# Patient Record
Sex: Female | Born: 1980
Health system: Southern US, Community
[De-identification: ages and names within clinical notes are randomized; demographics above are authoritative.]

## PROBLEM LIST (undated history)

## (undated) DIAGNOSIS — K589 Irritable bowel syndrome without diarrhea: Secondary | ICD-10-CM

## (undated) DIAGNOSIS — K219 Gastro-esophageal reflux disease without esophagitis: Secondary | ICD-10-CM

## (undated) DIAGNOSIS — R0602 Shortness of breath: Secondary | ICD-10-CM

## (undated) DIAGNOSIS — D509 Iron deficiency anemia, unspecified: Secondary | ICD-10-CM

## (undated) DIAGNOSIS — F410 Panic disorder [episodic paroxysmal anxiety] without agoraphobia: Secondary | ICD-10-CM

## (undated) DIAGNOSIS — IMO0002 Reserved for concepts with insufficient information to code with codable children: Secondary | ICD-10-CM

## (undated) DIAGNOSIS — R7303 Prediabetes: Secondary | ICD-10-CM

## (undated) DIAGNOSIS — E7800 Pure hypercholesterolemia, unspecified: Secondary | ICD-10-CM

## (undated) DIAGNOSIS — R002 Palpitations: Secondary | ICD-10-CM

## (undated) DIAGNOSIS — F32A Depression, unspecified: Secondary | ICD-10-CM

## (undated) DIAGNOSIS — A048 Other specified bacterial intestinal infections: Secondary | ICD-10-CM

## (undated) DIAGNOSIS — K5909 Other constipation: Secondary | ICD-10-CM

## (undated) DIAGNOSIS — E669 Obesity, unspecified: Secondary | ICD-10-CM

## (undated) DIAGNOSIS — M25569 Pain in unspecified knee: Secondary | ICD-10-CM

## (undated) DIAGNOSIS — K5900 Constipation, unspecified: Secondary | ICD-10-CM

## (undated) DIAGNOSIS — F329 Major depressive disorder, single episode, unspecified: Secondary | ICD-10-CM

## (undated) DIAGNOSIS — K760 Fatty (change of) liver, not elsewhere classified: Secondary | ICD-10-CM

## (undated) DIAGNOSIS — E559 Vitamin D deficiency, unspecified: Secondary | ICD-10-CM

## (undated) DIAGNOSIS — E538 Deficiency of other specified B group vitamins: Secondary | ICD-10-CM

## (undated) DIAGNOSIS — D649 Anemia, unspecified: Secondary | ICD-10-CM

## (undated) DIAGNOSIS — F419 Anxiety disorder, unspecified: Secondary | ICD-10-CM

## (undated) HISTORY — DX: Gastro-esophageal reflux disease without esophagitis: K21.9

## (undated) HISTORY — DX: Vitamin D deficiency, unspecified: E55.9

## (undated) HISTORY — DX: Other constipation: K59.09

## (undated) HISTORY — PX: UPPER GASTROINTESTINAL ENDOSCOPY: SHX188

## (undated) HISTORY — DX: Major depressive disorder, single episode, unspecified: F32.9

## (undated) HISTORY — DX: Irritable bowel syndrome, unspecified: K58.9

## (undated) HISTORY — DX: Anxiety disorder, unspecified: F41.9

## (undated) HISTORY — DX: Other specified bacterial intestinal infections: A04.8

## (undated) HISTORY — DX: Iron deficiency anemia, unspecified: D50.9

## (undated) HISTORY — DX: Obesity, unspecified: E66.9

## (undated) HISTORY — DX: Pure hypercholesterolemia, unspecified: E78.00

## (undated) HISTORY — DX: Fatty (change of) liver, not elsewhere classified: K76.0

## (undated) HISTORY — DX: Depression, unspecified: F32.A

## (undated) HISTORY — DX: Panic disorder (episodic paroxysmal anxiety): F41.0

## (undated) HISTORY — DX: Reserved for concepts with insufficient information to code with codable children: IMO0002

## (undated) HISTORY — PX: ABDOMINAL HYSTERECTOMY: SHX81

## (undated) HISTORY — DX: Prediabetes: R73.03

## (undated) HISTORY — DX: Shortness of breath: R06.02

## (undated) HISTORY — PX: NASAL SINUS SURGERY: SHX719

## (undated) HISTORY — DX: Palpitations: R00.2

## (undated) HISTORY — DX: Deficiency of other specified B group vitamins: E53.8

## (undated) HISTORY — DX: Pain in unspecified knee: M25.569

## (undated) HISTORY — DX: Anemia, unspecified: D64.9

## (undated) HISTORY — DX: Constipation, unspecified: K59.00

---

## 2004-09-18 ENCOUNTER — Other Ambulatory Visit: Admission: RE | Admit: 2004-09-18 | Discharge: 2004-09-18 | Payer: Self-pay | Admitting: Obstetrics and Gynecology

## 2004-10-14 ENCOUNTER — Inpatient Hospital Stay (HOSPITAL_COMMUNITY): Admission: EM | Admit: 2004-10-14 | Discharge: 2004-10-16 | Payer: Self-pay | Admitting: Emergency Medicine

## 2004-10-31 ENCOUNTER — Ambulatory Visit: Payer: Self-pay | Admitting: Pulmonary Disease

## 2004-11-12 ENCOUNTER — Ambulatory Visit: Payer: Self-pay | Admitting: Internal Medicine

## 2005-01-15 ENCOUNTER — Ambulatory Visit: Payer: Self-pay | Admitting: Internal Medicine

## 2005-02-06 ENCOUNTER — Ambulatory Visit: Payer: Self-pay | Admitting: Pulmonary Disease

## 2005-05-09 ENCOUNTER — Ambulatory Visit: Payer: Self-pay | Admitting: Internal Medicine

## 2005-06-19 ENCOUNTER — Inpatient Hospital Stay (HOSPITAL_COMMUNITY): Admission: AD | Admit: 2005-06-19 | Discharge: 2005-06-19 | Payer: Self-pay | Admitting: Family Medicine

## 2005-07-21 ENCOUNTER — Inpatient Hospital Stay (HOSPITAL_COMMUNITY): Admission: AD | Admit: 2005-07-21 | Discharge: 2005-07-21 | Payer: Self-pay | Admitting: Obstetrics

## 2005-08-22 ENCOUNTER — Ambulatory Visit (HOSPITAL_COMMUNITY): Admission: RE | Admit: 2005-08-22 | Discharge: 2005-08-22 | Payer: Self-pay | Admitting: *Deleted

## 2005-08-26 ENCOUNTER — Ambulatory Visit: Payer: Self-pay | Admitting: Pulmonary Disease

## 2005-09-08 ENCOUNTER — Inpatient Hospital Stay (HOSPITAL_COMMUNITY): Admission: AD | Admit: 2005-09-08 | Discharge: 2005-09-09 | Payer: Self-pay | Admitting: Obstetrics & Gynecology

## 2005-09-08 ENCOUNTER — Ambulatory Visit: Payer: Self-pay | Admitting: Certified Nurse Midwife

## 2005-09-11 ENCOUNTER — Ambulatory Visit: Payer: Self-pay | Admitting: Internal Medicine

## 2005-10-13 ENCOUNTER — Ambulatory Visit: Payer: Self-pay | Admitting: Internal Medicine

## 2005-11-03 ENCOUNTER — Inpatient Hospital Stay (HOSPITAL_COMMUNITY): Admission: AD | Admit: 2005-11-03 | Discharge: 2005-11-04 | Payer: Self-pay | Admitting: Internal Medicine

## 2005-11-04 ENCOUNTER — Ambulatory Visit: Payer: Self-pay | Admitting: Internal Medicine

## 2005-12-04 ENCOUNTER — Ambulatory Visit: Payer: Self-pay | Admitting: Internal Medicine

## 2006-01-07 ENCOUNTER — Inpatient Hospital Stay (HOSPITAL_COMMUNITY): Admission: AD | Admit: 2006-01-07 | Discharge: 2006-01-07 | Payer: Self-pay | Admitting: *Deleted

## 2006-01-21 ENCOUNTER — Ambulatory Visit (HOSPITAL_COMMUNITY): Admission: RE | Admit: 2006-01-21 | Discharge: 2006-01-21 | Payer: Self-pay | Admitting: Obstetrics and Gynecology

## 2006-01-26 ENCOUNTER — Inpatient Hospital Stay (HOSPITAL_COMMUNITY): Admission: AD | Admit: 2006-01-26 | Discharge: 2006-01-26 | Payer: Self-pay | Admitting: Obstetrics and Gynecology

## 2006-01-31 ENCOUNTER — Inpatient Hospital Stay (HOSPITAL_COMMUNITY): Admission: AD | Admit: 2006-01-31 | Discharge: 2006-01-31 | Payer: Self-pay | Admitting: Obstetrics and Gynecology

## 2006-02-01 ENCOUNTER — Inpatient Hospital Stay (HOSPITAL_COMMUNITY): Admission: AD | Admit: 2006-02-01 | Discharge: 2006-02-03 | Payer: Self-pay | Admitting: Obstetrics and Gynecology

## 2006-02-06 ENCOUNTER — Emergency Department (HOSPITAL_COMMUNITY): Admission: EM | Admit: 2006-02-06 | Discharge: 2006-02-06 | Payer: Self-pay | Admitting: Emergency Medicine

## 2006-03-19 ENCOUNTER — Ambulatory Visit: Payer: Self-pay | Admitting: Internal Medicine

## 2006-03-20 ENCOUNTER — Ambulatory Visit: Payer: Self-pay | Admitting: Internal Medicine

## 2006-04-01 ENCOUNTER — Ambulatory Visit: Payer: Self-pay | Admitting: Internal Medicine

## 2006-04-09 ENCOUNTER — Ambulatory Visit (HOSPITAL_COMMUNITY): Admission: RE | Admit: 2006-04-09 | Discharge: 2006-04-09 | Payer: Self-pay | Admitting: Obstetrics and Gynecology

## 2006-04-13 ENCOUNTER — Ambulatory Visit: Payer: Self-pay | Admitting: Internal Medicine

## 2006-04-14 ENCOUNTER — Encounter: Payer: Self-pay | Admitting: Vascular Surgery

## 2006-04-14 ENCOUNTER — Ambulatory Visit (HOSPITAL_COMMUNITY): Admission: RE | Admit: 2006-04-14 | Discharge: 2006-04-14 | Payer: Self-pay | Admitting: Internal Medicine

## 2006-04-24 ENCOUNTER — Ambulatory Visit: Payer: Self-pay | Admitting: Internal Medicine

## 2006-05-07 ENCOUNTER — Ambulatory Visit: Payer: Self-pay | Admitting: Internal Medicine

## 2006-05-08 ENCOUNTER — Ambulatory Visit: Payer: Self-pay | Admitting: Internal Medicine

## 2006-06-25 ENCOUNTER — Encounter: Admission: RE | Admit: 2006-06-25 | Discharge: 2006-09-23 | Payer: Self-pay | Admitting: Pediatrics

## 2006-07-22 ENCOUNTER — Ambulatory Visit: Payer: Self-pay | Admitting: Internal Medicine

## 2006-07-27 LAB — CONVERTED CEMR LAB
ALT: 31 units/L (ref 0–40)
AST: 30 units/L (ref 0–37)
Albumin: 3.6 g/dL (ref 3.5–5.2)
Alkaline Phosphatase: 70 units/L (ref 39–117)
BUN: 10 mg/dL (ref 6–23)
Basophils Absolute: 0 10*3/uL (ref 0.0–0.1)
Basophils Relative: 0.4 % (ref 0.0–1.0)
CO2: 27 meq/L (ref 19–32)
Calcium: 9.2 mg/dL (ref 8.4–10.5)
Chloride: 107 meq/L (ref 96–112)
Creatinine, Ser: 0.9 mg/dL (ref 0.4–1.2)
Eosinophil percent: 14 % — ABNORMAL HIGH (ref 0.0–5.0)
GFR calc non Af Amer: 82 mL/min
Glomerular Filtration Rate, Af Am: 99 mL/min/{1.73_m2}
Glucose, Bld: 97 mg/dL (ref 70–99)
HCT: 38.8 % (ref 36.0–46.0)
Hemoglobin: 13 g/dL (ref 12.0–15.0)
Lymphocytes Relative: 28.4 % (ref 12.0–46.0)
MCHC: 33.5 g/dL (ref 30.0–36.0)
MCV: 84.1 fL (ref 78.0–100.0)
Monocytes Absolute: 0.6 10*3/uL (ref 0.2–0.7)
Monocytes Relative: 6.2 % (ref 3.0–11.0)
Neutro Abs: 4.6 10*3/uL (ref 1.4–7.7)
Neutrophils Relative %: 51 % (ref 43.0–77.0)
Platelets: 264 10*3/uL (ref 150–400)
Potassium: 4.7 meq/L (ref 3.5–5.1)
RBC: 4.62 M/uL (ref 3.87–5.11)
RDW: 12 % (ref 11.5–14.6)
Sodium: 140 meq/L (ref 135–145)
TSH: 1.38 microintl units/mL (ref 0.35–5.50)
Total Bilirubin: 0.8 mg/dL (ref 0.3–1.2)
Total Protein: 7 g/dL (ref 6.0–8.3)
WBC: 9.1 10*3/uL (ref 4.5–10.5)

## 2006-08-14 ENCOUNTER — Encounter: Payer: Self-pay | Admitting: Cardiology

## 2006-08-14 ENCOUNTER — Ambulatory Visit: Payer: Self-pay

## 2006-08-27 ENCOUNTER — Ambulatory Visit: Payer: Self-pay | Admitting: Pulmonary Disease

## 2006-08-28 ENCOUNTER — Ambulatory Visit: Payer: Self-pay | Admitting: Cardiovascular Disease

## 2006-08-31 ENCOUNTER — Ambulatory Visit: Payer: Self-pay | Admitting: Internal Medicine

## 2006-09-08 DIAGNOSIS — A048 Other specified bacterial intestinal infections: Secondary | ICD-10-CM

## 2006-09-08 HISTORY — DX: Other specified bacterial intestinal infections: A04.8

## 2006-09-25 ENCOUNTER — Ambulatory Visit: Payer: Self-pay | Admitting: Internal Medicine

## 2006-10-07 ENCOUNTER — Ambulatory Visit: Payer: Self-pay | Admitting: Internal Medicine

## 2006-10-07 ENCOUNTER — Encounter: Payer: Self-pay | Admitting: Internal Medicine

## 2006-10-07 LAB — CONVERTED CEMR LAB
ALT: 24 units/L (ref 0–40)
AST: 25 units/L (ref 0–37)
Albumin: 4 g/dL (ref 3.5–5.2)
Alkaline Phosphatase: 76 units/L (ref 39–117)
Amylase: 116 units/L (ref 27–131)
Bilirubin Urine: NEGATIVE
Bilirubin, Direct: 0.1 mg/dL (ref 0.0–0.3)
H Pylori IgG: POSITIVE — AB
Ketones, ur: NEGATIVE mg/dL
Leukocytes, UA: NEGATIVE
Lipase: 33 units/L (ref 11.0–59.0)
Nitrite: NEGATIVE
Specific Gravity, Urine: 1.02 (ref 1.000–1.03)
Total Bilirubin: 0.5 mg/dL (ref 0.3–1.2)
Total Protein, Urine: NEGATIVE mg/dL
Total Protein: 7.6 g/dL (ref 6.0–8.3)
Urine Glucose: NEGATIVE mg/dL
Urobilinogen, UA: 0.2 (ref 0.0–1.0)
pH: 6.5 (ref 5.0–8.0)

## 2006-10-08 ENCOUNTER — Ambulatory Visit: Payer: Self-pay | Admitting: Gastroenterology

## 2006-10-27 ENCOUNTER — Ambulatory Visit: Payer: Self-pay | Admitting: Internal Medicine

## 2006-10-27 LAB — CONVERTED CEMR LAB
ALT: 25 units/L (ref 0–40)
AST: 24 units/L (ref 0–37)
Albumin: 3.8 g/dL (ref 3.5–5.2)
Alkaline Phosphatase: 73 units/L (ref 39–117)
BUN: 10 mg/dL (ref 6–23)
Bacteria, UA: NEGATIVE
Basophils Absolute: 0.1 10*3/uL (ref 0.0–0.1)
Basophils Relative: 0.8 % (ref 0.0–1.0)
Bilirubin Urine: NEGATIVE
Bilirubin, Direct: 0.1 mg/dL (ref 0.0–0.3)
CO2: 28 meq/L (ref 19–32)
Calcium: 9.4 mg/dL (ref 8.4–10.5)
Chloride: 105 meq/L (ref 96–112)
Cholesterol: 133 mg/dL (ref 0–200)
Creatinine, Ser: 0.7 mg/dL (ref 0.4–1.2)
Crystals: NEGATIVE
Eosinophils Absolute: 1.2 10*3/uL — ABNORMAL HIGH (ref 0.0–0.6)
Eosinophils Relative: 14.7 % — ABNORMAL HIGH (ref 0.0–5.0)
GFR calc Af Amer: 132 mL/min
GFR calc non Af Amer: 109 mL/min
Glucose, Bld: 89 mg/dL (ref 70–99)
H Pylori IgG: POSITIVE — AB
HCT: 38 % (ref 36.0–46.0)
HDL: 36.4 mg/dL — ABNORMAL LOW (ref >39.0)
Hemoglobin: 12.9 g/dL (ref 12.0–15.0)
Ketones, ur: NEGATIVE mg/dL
LDL Cholesterol: 77 mg/dL (ref 0–99)
Leukocytes, UA: NEGATIVE
Lymphocytes Relative: 36.4 % (ref 12.0–46.0)
MCHC: 33.9 g/dL (ref 30.0–36.0)
MCV: 82.6 fL (ref 78.0–100.0)
Monocytes Absolute: 0.5 10*3/uL (ref 0.2–0.7)
Monocytes Relative: 5.7 % (ref 3.0–11.0)
Neutro Abs: 3.3 10*3/uL (ref 1.4–7.7)
Neutrophils Relative %: 42.4 % — ABNORMAL LOW (ref 43.0–77.0)
Nitrite: NEGATIVE
Platelets: 257 10*3/uL (ref 150–400)
Potassium: 4.2 meq/L (ref 3.5–5.1)
RBC: 4.6 M/uL (ref 3.87–5.11)
RDW: 13.4 % (ref 11.5–14.6)
Sodium: 139 meq/L (ref 135–145)
Specific Gravity, Urine: 1.02 (ref 1.000–1.03)
TSH: 0.79 microintl units/mL (ref 0.35–5.50)
Total Bilirubin: 0.5 mg/dL (ref 0.3–1.2)
Total CHOL/HDL Ratio: 3.7
Total Protein, Urine: NEGATIVE mg/dL
Total Protein: 7.2 g/dL (ref 6.0–8.3)
Triglycerides: 97 mg/dL (ref 0–149)
Urine Glucose: NEGATIVE mg/dL
Urobilinogen, UA: 0.2 (ref 0.0–1.0)
VLDL: 19 mg/dL (ref 0–40)
WBC: 8 10*3/uL (ref 4.5–10.5)
pH: 6.5 (ref 5.0–8.0)

## 2006-11-03 ENCOUNTER — Ambulatory Visit: Payer: Self-pay | Admitting: Internal Medicine

## 2006-11-04 ENCOUNTER — Ambulatory Visit: Payer: Self-pay | Admitting: Internal Medicine

## 2007-01-01 ENCOUNTER — Ambulatory Visit: Payer: Self-pay | Admitting: Internal Medicine

## 2007-02-16 ENCOUNTER — Inpatient Hospital Stay (HOSPITAL_COMMUNITY): Admission: AD | Admit: 2007-02-16 | Discharge: 2007-02-16 | Payer: Self-pay | Admitting: Obstetrics and Gynecology

## 2007-03-23 ENCOUNTER — Ambulatory Visit: Payer: Self-pay | Admitting: Internal Medicine

## 2007-03-26 ENCOUNTER — Ambulatory Visit: Payer: Self-pay | Admitting: Internal Medicine

## 2007-04-03 ENCOUNTER — Encounter: Payer: Self-pay | Admitting: Internal Medicine

## 2007-04-03 DIAGNOSIS — J45909 Unspecified asthma, uncomplicated: Secondary | ICD-10-CM | POA: Insufficient documentation

## 2007-04-03 DIAGNOSIS — J329 Chronic sinusitis, unspecified: Secondary | ICD-10-CM | POA: Insufficient documentation

## 2007-04-30 ENCOUNTER — Emergency Department (HOSPITAL_COMMUNITY): Admission: EM | Admit: 2007-04-30 | Discharge: 2007-05-01 | Payer: Self-pay | Admitting: Emergency Medicine

## 2007-05-26 ENCOUNTER — Encounter: Payer: Self-pay | Admitting: Obstetrics and Gynecology

## 2007-05-26 ENCOUNTER — Ambulatory Visit (HOSPITAL_COMMUNITY): Admission: RE | Admit: 2007-05-26 | Discharge: 2007-05-26 | Payer: Self-pay | Admitting: Obstetrics and Gynecology

## 2007-09-05 ENCOUNTER — Inpatient Hospital Stay (HOSPITAL_COMMUNITY): Admission: AD | Admit: 2007-09-05 | Discharge: 2007-09-05 | Payer: Self-pay | Admitting: Obstetrics and Gynecology

## 2007-09-24 ENCOUNTER — Inpatient Hospital Stay (HOSPITAL_COMMUNITY): Admission: AD | Admit: 2007-09-24 | Discharge: 2007-09-26 | Payer: Self-pay | Admitting: Obstetrics and Gynecology

## 2007-10-19 ENCOUNTER — Ambulatory Visit: Payer: Self-pay | Admitting: Internal Medicine

## 2007-10-20 ENCOUNTER — Ambulatory Visit: Payer: Self-pay | Admitting: Internal Medicine

## 2007-10-21 LAB — CONVERTED CEMR LAB
ALT: 22 units/L (ref 0–35)
AST: 28 units/L (ref 0–37)
Albumin: 3.9 g/dL (ref 3.5–5.2)
Alkaline Phosphatase: 83 units/L (ref 39–117)
BUN: 7 mg/dL (ref 6–23)
Basophils Absolute: 0.1 10*3/uL (ref 0.0–0.1)
Basophils Relative: 2.1 % — ABNORMAL HIGH (ref 0.0–1.0)
Bilirubin Urine: NEGATIVE
Bilirubin, Direct: 0.1 mg/dL (ref 0.0–0.3)
CO2: 29 meq/L (ref 19–32)
Calcium: 9.1 mg/dL (ref 8.4–10.5)
Chloride: 105 meq/L (ref 96–112)
Cholesterol: 155 mg/dL (ref 0–200)
Creatinine, Ser: 0.8 mg/dL (ref 0.4–1.2)
Crystals: NEGATIVE
Eosinophils Absolute: 0.3 10*3/uL (ref 0.0–0.6)
Eosinophils Relative: 4.5 % (ref 0.0–5.0)
GFR calc Af Amer: 112 mL/min
GFR calc non Af Amer: 93 mL/min
Glucose, Bld: 94 mg/dL (ref 70–99)
HCT: 42 % (ref 36.0–46.0)
HDL: 42.6 mg/dL (ref >39.0)
Hemoglobin: 13.6 g/dL (ref 12.0–15.0)
Ketones, ur: NEGATIVE mg/dL
LDL Cholesterol: 97 mg/dL (ref 0–99)
Lymphocytes Relative: 16.7 % (ref 12.0–46.0)
MCHC: 32.3 g/dL (ref 30.0–36.0)
MCV: 83 fL (ref 78.0–100.0)
Monocytes Absolute: 0.6 10*3/uL (ref 0.2–0.7)
Monocytes Relative: 10.2 % (ref 3.0–11.0)
Mucus, UA: NEGATIVE
Neutro Abs: 4.1 10*3/uL (ref 1.4–7.7)
Neutrophils Relative %: 66.5 % (ref 43.0–77.0)
Nitrite: NEGATIVE
Platelets: 206 10*3/uL (ref 150–400)
Potassium: 3.6 meq/L (ref 3.5–5.1)
RBC: 5.06 M/uL (ref 3.87–5.11)
RDW: 13.5 % (ref 11.5–14.6)
Sodium: 142 meq/L (ref 135–145)
Specific Gravity, Urine: <=1.005 (ref 1.000–1.03)
TSH: 0.24 microintl units/mL — ABNORMAL LOW (ref 0.35–5.50)
Total Bilirubin: 0.4 mg/dL (ref 0.3–1.2)
Total CHOL/HDL Ratio: 3.6
Total Protein, Urine: NEGATIVE mg/dL
Total Protein: 7.6 g/dL (ref 6.0–8.3)
Triglycerides: 77 mg/dL (ref 0–149)
Urine Glucose: NEGATIVE mg/dL
Urobilinogen, UA: 0.2 (ref 0.0–1.0)
VLDL: 15 mg/dL (ref 0–40)
WBC: 6.1 10*3/uL (ref 4.5–10.5)
pH: 5.5 (ref 5.0–8.0)

## 2007-10-22 LAB — CONVERTED CEMR LAB
Inflenza A Ag: POSITIVE — AB
Influenza B Ag: NEGATIVE

## 2007-11-25 ENCOUNTER — Ambulatory Visit: Payer: Self-pay | Admitting: Internal Medicine

## 2007-11-25 DIAGNOSIS — F411 Generalized anxiety disorder: Secondary | ICD-10-CM | POA: Insufficient documentation

## 2007-11-25 DIAGNOSIS — J309 Allergic rhinitis, unspecified: Secondary | ICD-10-CM | POA: Insufficient documentation

## 2007-11-25 DIAGNOSIS — K219 Gastro-esophageal reflux disease without esophagitis: Secondary | ICD-10-CM | POA: Insufficient documentation

## 2007-11-30 ENCOUNTER — Telehealth: Payer: Self-pay | Admitting: Family Medicine

## 2007-12-08 ENCOUNTER — Ambulatory Visit: Payer: Self-pay

## 2007-12-08 ENCOUNTER — Encounter: Payer: Self-pay | Admitting: Internal Medicine

## 2008-01-11 ENCOUNTER — Ambulatory Visit: Payer: Self-pay | Admitting: Internal Medicine

## 2008-01-11 DIAGNOSIS — R002 Palpitations: Secondary | ICD-10-CM | POA: Insufficient documentation

## 2008-01-11 DIAGNOSIS — R519 Headache, unspecified: Secondary | ICD-10-CM | POA: Insufficient documentation

## 2008-01-11 DIAGNOSIS — R51 Headache: Secondary | ICD-10-CM | POA: Insufficient documentation

## 2008-01-14 LAB — CONVERTED CEMR LAB
BUN: 14 mg/dL (ref 6–23)
Basophils Absolute: 0 10*3/uL (ref 0.0–0.1)
Basophils Relative: 0.6 % (ref 0.0–1.0)
CO2: 31 meq/L (ref 19–32)
Calcium: 9.2 mg/dL (ref 8.4–10.5)
Chloride: 107 meq/L (ref 96–112)
Creatinine, Ser: 0.8 mg/dL (ref 0.4–1.2)
Eosinophils Absolute: 0.8 10*3/uL — ABNORMAL HIGH (ref 0.0–0.7)
Eosinophils Relative: 11.1 % — ABNORMAL HIGH (ref 0.0–5.0)
GFR calc Af Amer: 112 mL/min
GFR calc non Af Amer: 93 mL/min
Glucose, Bld: 99 mg/dL (ref 70–99)
HCT: 37.8 % (ref 36.0–46.0)
Hemoglobin: 12.7 g/dL (ref 12.0–15.0)
Lymphocytes Relative: 30.9 % (ref 12.0–46.0)
MCHC: 33.5 g/dL (ref 30.0–36.0)
MCV: 87 fL (ref 78.0–100.0)
Monocytes Absolute: 0.4 10*3/uL (ref 0.1–1.0)
Monocytes Relative: 5.7 % (ref 3.0–12.0)
Neutro Abs: 3.8 10*3/uL (ref 1.4–7.7)
Neutrophils Relative %: 51.7 % (ref 43.0–77.0)
Platelets: 235 10*3/uL (ref 150–400)
Potassium: 4.4 meq/L (ref 3.5–5.1)
RBC: 4.35 M/uL (ref 3.87–5.11)
RDW: 13.6 % (ref 11.5–14.6)
Sodium: 142 meq/L (ref 135–145)
TSH: 1.5 microintl units/mL (ref 0.35–5.50)
WBC: 7.2 10*3/uL (ref 4.5–10.5)

## 2008-01-19 ENCOUNTER — Telehealth: Payer: Self-pay | Admitting: Internal Medicine

## 2008-01-20 ENCOUNTER — Telehealth: Payer: Self-pay | Admitting: Internal Medicine

## 2008-02-15 ENCOUNTER — Telehealth: Payer: Self-pay | Admitting: Family Medicine

## 2008-02-21 ENCOUNTER — Telehealth: Payer: Self-pay | Admitting: Internal Medicine

## 2008-02-21 ENCOUNTER — Emergency Department (HOSPITAL_COMMUNITY): Admission: EM | Admit: 2008-02-21 | Discharge: 2008-02-21 | Payer: Self-pay | Admitting: Emergency Medicine

## 2008-03-01 ENCOUNTER — Telehealth: Payer: Self-pay | Admitting: Internal Medicine

## 2008-03-03 ENCOUNTER — Ambulatory Visit: Payer: Self-pay | Admitting: Internal Medicine

## 2008-03-16 ENCOUNTER — Telehealth (INDEPENDENT_AMBULATORY_CARE_PROVIDER_SITE_OTHER): Payer: Self-pay | Admitting: *Deleted

## 2008-03-27 ENCOUNTER — Ambulatory Visit: Payer: Self-pay | Admitting: Internal Medicine

## 2008-03-27 LAB — CONVERTED CEMR LAB
BUN: 9 mg/dL (ref 6–23)
CO2: 26 meq/L (ref 19–32)
Calcium: 9.1 mg/dL (ref 8.4–10.5)
Chloride: 105 meq/L (ref 96–112)
Creatinine, Ser: 0.7 mg/dL (ref 0.4–1.2)
GFR calc Af Amer: 130 mL/min
GFR calc non Af Amer: 108 mL/min
Glucose, Bld: 89 mg/dL (ref 70–99)
Magnesium: 2.1 mg/dL (ref 1.5–2.5)
Potassium: 3.7 meq/L (ref 3.5–5.1)
Sodium: 137 meq/L (ref 135–145)

## 2008-03-29 ENCOUNTER — Ambulatory Visit: Payer: Self-pay | Admitting: Internal Medicine

## 2008-03-29 DIAGNOSIS — R5383 Other fatigue: Secondary | ICD-10-CM | POA: Insufficient documentation

## 2008-03-29 DIAGNOSIS — R5381 Other malaise: Secondary | ICD-10-CM | POA: Insufficient documentation

## 2008-04-07 ENCOUNTER — Ambulatory Visit: Payer: Self-pay | Admitting: Internal Medicine

## 2008-04-07 DIAGNOSIS — K589 Irritable bowel syndrome without diarrhea: Secondary | ICD-10-CM | POA: Insufficient documentation

## 2008-04-11 ENCOUNTER — Encounter: Payer: Self-pay | Admitting: Internal Medicine

## 2008-04-12 ENCOUNTER — Encounter: Payer: Self-pay | Admitting: Internal Medicine

## 2008-04-12 LAB — CONVERTED CEMR LAB
Calcium: 9.3 mg/dL (ref 8.4–10.5)
GFR calc Af Amer: 130 mL/min
GFR calc non Af Amer: 108 mL/min
Leukocytes, UA: NEGATIVE
Potassium: 4.1 meq/L (ref 3.5–5.1)
Pro B Natriuretic peptide (BNP): 24 pg/mL (ref 0.0–100.0)
Sodium: 140 meq/L (ref 135–145)
Specific Gravity, Urine: 1.02 (ref 1.000–1.03)
TSH: 1.21 microintl units/mL (ref 0.35–5.50)
Urobilinogen, UA: 0.2 (ref 0.0–1.0)

## 2008-04-14 ENCOUNTER — Encounter: Payer: Self-pay | Admitting: Internal Medicine

## 2008-04-14 ENCOUNTER — Ambulatory Visit (HOSPITAL_COMMUNITY): Admission: RE | Admit: 2008-04-14 | Discharge: 2008-04-14 | Payer: Self-pay | Admitting: Internal Medicine

## 2008-04-14 HISTORY — PX: FLEXIBLE SIGMOIDOSCOPY: SHX1649

## 2008-04-18 ENCOUNTER — Encounter: Payer: Self-pay | Admitting: Internal Medicine

## 2008-05-02 ENCOUNTER — Emergency Department (HOSPITAL_COMMUNITY): Admission: EM | Admit: 2008-05-02 | Discharge: 2008-05-02 | Payer: Self-pay | Admitting: Pediatrics

## 2008-05-03 ENCOUNTER — Ambulatory Visit: Payer: Self-pay | Admitting: Internal Medicine

## 2008-05-22 ENCOUNTER — Telehealth: Payer: Self-pay | Admitting: Internal Medicine

## 2008-05-26 ENCOUNTER — Telehealth (INDEPENDENT_AMBULATORY_CARE_PROVIDER_SITE_OTHER): Payer: Self-pay | Admitting: *Deleted

## 2008-05-26 ENCOUNTER — Ambulatory Visit: Payer: Self-pay | Admitting: Internal Medicine

## 2008-06-09 ENCOUNTER — Telehealth: Payer: Self-pay | Admitting: Internal Medicine

## 2008-06-23 ENCOUNTER — Ambulatory Visit: Payer: Self-pay | Admitting: Internal Medicine

## 2008-07-04 ENCOUNTER — Telehealth: Payer: Self-pay | Admitting: Internal Medicine

## 2008-07-14 ENCOUNTER — Telehealth: Payer: Self-pay | Admitting: Internal Medicine

## 2008-07-15 ENCOUNTER — Emergency Department (HOSPITAL_COMMUNITY): Admission: EM | Admit: 2008-07-15 | Discharge: 2008-07-15 | Payer: Self-pay | Admitting: Emergency Medicine

## 2008-08-07 ENCOUNTER — Ambulatory Visit: Payer: Self-pay | Admitting: Internal Medicine

## 2008-08-11 ENCOUNTER — Telehealth (INDEPENDENT_AMBULATORY_CARE_PROVIDER_SITE_OTHER): Payer: Self-pay | Admitting: *Deleted

## 2008-08-11 ENCOUNTER — Telehealth: Payer: Self-pay | Admitting: Internal Medicine

## 2008-08-18 ENCOUNTER — Telehealth (INDEPENDENT_AMBULATORY_CARE_PROVIDER_SITE_OTHER): Payer: Self-pay | Admitting: *Deleted

## 2008-11-01 ENCOUNTER — Ambulatory Visit: Payer: Self-pay | Admitting: Internal Medicine

## 2008-11-28 ENCOUNTER — Telehealth: Payer: Self-pay | Admitting: Internal Medicine

## 2008-12-13 ENCOUNTER — Telehealth (INDEPENDENT_AMBULATORY_CARE_PROVIDER_SITE_OTHER): Payer: Self-pay | Admitting: *Deleted

## 2008-12-15 ENCOUNTER — Ambulatory Visit: Payer: Self-pay | Admitting: Internal Medicine

## 2008-12-18 LAB — CONVERTED CEMR LAB
BUN: 9 mg/dL (ref 6–23)
Basophils Absolute: 0.1 10*3/uL (ref 0.0–0.1)
Chloride: 104 meq/L (ref 96–112)
Creatinine, Ser: 0.7 mg/dL (ref 0.4–1.2)
Eosinophils Absolute: 0.7 10*3/uL (ref 0.0–0.7)
GFR calc non Af Amer: 106.82 mL/min (ref >60)
Hemoglobin: 11.6 g/dL — ABNORMAL LOW (ref 12.0–15.0)
Iron: 18 ug/dL — ABNORMAL LOW (ref 42–145)
Lymphocytes Relative: 32.9 % (ref 12.0–46.0)
MCHC: 32.5 g/dL (ref 30.0–36.0)
MCV: 79.3 fL (ref 78.0–100.0)
Monocytes Absolute: 0.4 10*3/uL (ref 0.1–1.0)
Neutro Abs: 3 10*3/uL (ref 1.4–7.7)
Neutrophils Relative %: 49.4 % (ref 43.0–77.0)
RDW: 13.1 % (ref 11.5–14.6)
Transferrin: 320.5 mg/dL (ref 212.0–360.0)

## 2009-02-25 ENCOUNTER — Emergency Department (HOSPITAL_COMMUNITY): Admission: EM | Admit: 2009-02-25 | Discharge: 2009-02-25 | Payer: Self-pay | Admitting: Emergency Medicine

## 2009-03-09 ENCOUNTER — Ambulatory Visit: Payer: Self-pay | Admitting: Internal Medicine

## 2009-03-09 DIAGNOSIS — A048 Other specified bacterial intestinal infections: Secondary | ICD-10-CM | POA: Insufficient documentation

## 2009-03-09 DIAGNOSIS — D649 Anemia, unspecified: Secondary | ICD-10-CM | POA: Insufficient documentation

## 2009-03-13 LAB — CONVERTED CEMR LAB
Basophils Absolute: 0.1 10*3/uL (ref 0.0–0.1)
Eosinophils Absolute: 0.5 10*3/uL (ref 0.0–0.7)
Ferritin: 8.4 ng/mL — ABNORMAL LOW (ref 10.0–291.0)
Lymphocytes Relative: 31.9 % (ref 12.0–46.0)
MCHC: 34.1 g/dL (ref 30.0–36.0)
MCV: 81.9 fL (ref 78.0–100.0)
Monocytes Absolute: 0.5 10*3/uL (ref 0.1–1.0)
Neutrophils Relative %: 52.8 % (ref 43.0–77.0)
Platelets: 208 10*3/uL (ref 150.0–400.0)
RBC: 4.24 M/uL (ref 3.87–5.11)
RDW: 15.2 % — ABNORMAL HIGH (ref 11.5–14.6)

## 2009-03-27 ENCOUNTER — Telehealth: Payer: Self-pay | Admitting: Internal Medicine

## 2009-03-27 ENCOUNTER — Encounter: Payer: Self-pay | Admitting: Internal Medicine

## 2009-04-19 ENCOUNTER — Encounter (INDEPENDENT_AMBULATORY_CARE_PROVIDER_SITE_OTHER): Payer: Self-pay | Admitting: *Deleted

## 2009-05-07 ENCOUNTER — Ambulatory Visit: Payer: Self-pay | Admitting: Internal Medicine

## 2009-05-07 DIAGNOSIS — J0100 Acute maxillary sinusitis, unspecified: Secondary | ICD-10-CM | POA: Insufficient documentation

## 2009-05-16 ENCOUNTER — Ambulatory Visit: Payer: Self-pay | Admitting: Internal Medicine

## 2009-05-16 ENCOUNTER — Telehealth: Payer: Self-pay | Admitting: Internal Medicine

## 2009-05-16 LAB — CONVERTED CEMR LAB
ALT: 15 units/L (ref 0–35)
Alkaline Phosphatase: 52 units/L (ref 39–117)
Bilirubin, Direct: 0 mg/dL (ref 0.0–0.3)
CO2: 28 meq/L (ref 19–32)
Calcium: 9.2 mg/dL (ref 8.4–10.5)
Chloride: 104 meq/L (ref 96–112)
Creatinine, Ser: 0.7 mg/dL (ref 0.4–1.2)
Sodium: 139 meq/L (ref 135–145)
Total Bilirubin: 0.9 mg/dL (ref 0.3–1.2)

## 2009-05-18 LAB — CONVERTED CEMR LAB
Basophils Relative: 0.9 % (ref 0.0–3.0)
Eosinophils Relative: 7.4 % — ABNORMAL HIGH (ref 0.0–5.0)
Ferritin: 12.8 ng/mL (ref 10.0–291.0)
Iron: 93 ug/dL (ref 42–145)
Lymphocytes Relative: 32.4 % (ref 12.0–46.0)
MCV: 86.3 fL (ref 78.0–100.0)
Monocytes Absolute: 0.4 10*3/uL (ref 0.1–1.0)
Neutrophils Relative %: 53.8 % (ref 43.0–77.0)
Platelets: 249 10*3/uL (ref 150.0–400.0)
RBC: 4.47 M/uL (ref 3.87–5.11)
WBC: 7 10*3/uL (ref 4.5–10.5)

## 2009-06-11 ENCOUNTER — Telehealth: Payer: Self-pay | Admitting: Internal Medicine

## 2009-06-22 ENCOUNTER — Ambulatory Visit: Payer: Self-pay | Admitting: Internal Medicine

## 2009-09-12 ENCOUNTER — Ambulatory Visit: Payer: Self-pay | Admitting: Internal Medicine

## 2009-09-18 LAB — CONVERTED CEMR LAB
Basophils Absolute: 0 10*3/uL (ref 0.0–0.1)
Basophils Relative: 0.6 % (ref 0.0–3.0)
Eosinophils Absolute: 0.5 10*3/uL (ref 0.0–0.7)
Hemoglobin: 11.5 g/dL — ABNORMAL LOW (ref 12.0–15.0)
MCHC: 32.4 g/dL (ref 30.0–36.0)
MCV: 86.3 fL (ref 78.0–100.0)
Monocytes Absolute: 0.4 10*3/uL (ref 0.1–1.0)
Neutro Abs: 3.1 10*3/uL (ref 1.4–7.7)
RBC: 4.11 M/uL (ref 3.87–5.11)
RDW: 12.8 % (ref 11.5–14.6)

## 2009-10-02 ENCOUNTER — Telehealth: Payer: Self-pay | Admitting: Internal Medicine

## 2009-10-05 ENCOUNTER — Telehealth: Payer: Self-pay | Admitting: Internal Medicine

## 2009-11-13 ENCOUNTER — Ambulatory Visit (HOSPITAL_COMMUNITY): Admission: RE | Admit: 2009-11-13 | Discharge: 2009-11-13 | Payer: Self-pay | Admitting: Obstetrics and Gynecology

## 2009-12-27 ENCOUNTER — Ambulatory Visit: Payer: Self-pay | Admitting: Internal Medicine

## 2009-12-27 LAB — CONVERTED CEMR LAB
Basophils Absolute: 0.1 10*3/uL (ref 0.0–0.1)
Eosinophils Absolute: 0.5 10*3/uL (ref 0.0–0.7)
Glucose, Bld: 72 mg/dL (ref 70–99)
Lymphocytes Relative: 20.6 % (ref 12.0–46.0)
MCHC: 34 g/dL (ref 30.0–36.0)
Neutrophils Relative %: 67.9 % (ref 43.0–77.0)
Platelets: 184 10*3/uL (ref 150.0–400.0)
RDW: 13.8 % (ref 11.5–14.6)

## 2010-06-06 ENCOUNTER — Inpatient Hospital Stay (HOSPITAL_COMMUNITY): Admission: AD | Admit: 2010-06-06 | Discharge: 2010-06-06 | Payer: Self-pay | Admitting: Obstetrics and Gynecology

## 2010-06-17 ENCOUNTER — Inpatient Hospital Stay (HOSPITAL_COMMUNITY): Admission: RE | Admit: 2010-06-17 | Discharge: 2010-06-19 | Payer: Self-pay | Admitting: Obstetrics and Gynecology

## 2010-07-10 ENCOUNTER — Ambulatory Visit: Payer: Self-pay | Admitting: Internal Medicine

## 2010-07-29 ENCOUNTER — Telehealth: Payer: Self-pay | Admitting: Internal Medicine

## 2010-08-13 ENCOUNTER — Telehealth: Payer: Self-pay | Admitting: Internal Medicine

## 2010-08-19 ENCOUNTER — Encounter: Payer: Self-pay | Admitting: Internal Medicine

## 2010-08-28 ENCOUNTER — Encounter
Admission: RE | Admit: 2010-08-28 | Discharge: 2010-08-28 | Payer: Self-pay | Source: Home / Self Care | Attending: Otolaryngology | Admitting: Otolaryngology

## 2010-09-03 ENCOUNTER — Telehealth: Payer: Self-pay | Admitting: Internal Medicine

## 2010-09-04 ENCOUNTER — Ambulatory Visit
Admission: RE | Admit: 2010-09-04 | Discharge: 2010-09-04 | Payer: Self-pay | Source: Home / Self Care | Attending: Internal Medicine | Admitting: Internal Medicine

## 2010-09-08 HISTORY — PX: NASAL SINUS SURGERY: SHX719

## 2010-09-29 ENCOUNTER — Encounter: Payer: Self-pay | Admitting: Obstetrics and Gynecology

## 2010-10-04 ENCOUNTER — Other Ambulatory Visit: Payer: Self-pay | Admitting: Internal Medicine

## 2010-10-04 ENCOUNTER — Ambulatory Visit
Admission: RE | Admit: 2010-10-04 | Discharge: 2010-10-04 | Payer: Self-pay | Source: Home / Self Care | Attending: Internal Medicine | Admitting: Internal Medicine

## 2010-10-04 LAB — BASIC METABOLIC PANEL
BUN: 9 mg/dL (ref 6–23)
CO2: 29 mEq/L (ref 19–32)
Calcium: 9.3 mg/dL (ref 8.4–10.5)
Chloride: 106 mEq/L (ref 96–112)
Creatinine, Ser: 0.6 mg/dL (ref 0.4–1.2)
Glucose, Bld: 76 mg/dL (ref 70–99)

## 2010-10-04 LAB — CBC WITH DIFFERENTIAL/PLATELET
Basophils Absolute: 0 10*3/uL (ref 0.0–0.1)
Basophils Relative: 0.5 % (ref 0.0–3.0)
Eosinophils Absolute: 0.7 10*3/uL (ref 0.0–0.7)
Eosinophils Relative: 9 % — ABNORMAL HIGH (ref 0.0–5.0)
HCT: 34.9 % — ABNORMAL LOW (ref 36.0–46.0)
Lymphocytes Relative: 29.1 % (ref 12.0–46.0)
Lymphs Abs: 2.4 10*3/uL (ref 0.7–4.0)
MCHC: 34.6 g/dL (ref 30.0–36.0)
MCV: 87 fl (ref 78.0–100.0)
Monocytes Absolute: 0.5 10*3/uL (ref 0.1–1.0)
Monocytes Relative: 6.4 % (ref 3.0–12.0)
Neutro Abs: 4.5 10*3/uL (ref 1.4–7.7)
Neutrophils Relative %: 55 % (ref 43.0–77.0)
Platelets: 212 10*3/uL (ref 150.0–400.0)
RBC: 4.01 Mil/uL (ref 3.87–5.11)
WBC: 8.2 10*3/uL (ref 4.5–10.5)

## 2010-10-04 LAB — LIPID PANEL
Cholesterol: 147 mg/dL (ref 0–200)
HDL: 53.5 mg/dL (ref >39.00)
LDL Cholesterol: 84 mg/dL (ref 0–99)
Total CHOL/HDL Ratio: 3
Triglycerides: 50 mg/dL (ref 0.0–149.0)
VLDL: 10 mg/dL (ref 0.0–40.0)

## 2010-10-04 LAB — HEPATIC FUNCTION PANEL
ALT: 29 U/L (ref 0–35)
Albumin: 3.7 g/dL (ref 3.5–5.2)
Alkaline Phosphatase: 61 U/L (ref 39–117)
Bilirubin, Direct: 0.1 mg/dL (ref 0.0–0.3)
Total Bilirubin: 0.6 mg/dL (ref 0.3–1.2)
Total Protein: 6.9 g/dL (ref 6.0–8.3)

## 2010-10-04 LAB — URINALYSIS, ROUTINE W REFLEX MICROSCOPIC
Hemoglobin, Urine: NEGATIVE
Ketones, ur: NEGATIVE
Nitrite: NEGATIVE
Specific Gravity, Urine: 1.015 (ref 1.000–1.030)
Total Protein, Urine: NEGATIVE
Urine Glucose: NEGATIVE
Urobilinogen, UA: 0.2 (ref 0.0–1.0)
pH: 6.5 (ref 5.0–8.0)

## 2010-10-08 ENCOUNTER — Telehealth: Payer: Self-pay | Admitting: Internal Medicine

## 2010-10-08 DIAGNOSIS — R112 Nausea with vomiting, unspecified: Secondary | ICD-10-CM | POA: Insufficient documentation

## 2010-10-08 DIAGNOSIS — R1011 Right upper quadrant pain: Secondary | ICD-10-CM | POA: Insufficient documentation

## 2010-10-08 NOTE — Progress Notes (Signed)
Summary: req for rx  Phone Note Call from Patient Call back at [facility] Phone [phone]   Summary of Call: Patient c/o sinus congestion & yellow mucus, h/a & sinus pain. Patient is requesting rx for antibiotic to go to [facility] outpt pharm.  Initial call taken by: Lamar Sprinkles, CMA,  July 29, 2010 12:50 PM  Follow-up for Phone Call        ok zpac Follow-up by: Tresa Garter MD,  July 29, 2010 1:24 PM  Additional Follow-up for Phone Call Additional follow up Details #1::        Pt informed Additional Follow-up by: Lamar Sprinkles, CMA,  July 29, 2010 2:26 PM    New/Updated Medications: ZITHROMAX Z-PAK 250 MG TABS (AZITHROMYCIN) as dirrected Prescriptions: ZITHROMAX Z-PAK 250 MG TABS (AZITHROMYCIN) as dirrected  #1 x 0   Entered by:   Lamar Sprinkles, CMA   Authorized by:   Tresa Garter MD   Signed by:   Lamar Sprinkles, CMA on 07/29/2010   Method used:   Electronically to        [facility]* (retail)       [address]       [address] Shipping/mailing       [place], [place]  [address]       Ph: [phone]       Fax: [phone]   RxID:   [id number] ZITHROMAX Z-PAK 250 MG TABS (AZITHROMYCIN) as dirrected  #1 x 0   Entered and Authorized by:   Tresa Garter MD   Signed by:   Tresa Garter MD on 07/29/2010   Method used:   Electronically to        [facility] [address] [id number]* (retail)       [address] [place], [place]  [address]       Ph: [phone]       Fax: [phone]   RxID:   [id number]

## 2010-10-08 NOTE — Progress Notes (Signed)
Summary: REQ FOR RX  Phone Note Call from Patient Call back at [phone]   Caller: [phone] Summary of Call: Patient left vm requesting rx for zpak.  Initial call taken by: Lamar Sprinkles, CMA,  October 05, 2009 11:40 AM  Follow-up for Phone Call        Use over-the-counter medicines for "cold": Tylenol  650mg  or Advil 400mg  every 6 hours  for fever; Delsym or Robutussin for cough. Mucinex or Mucinex D for congestion. Ricola or Halls for sore throat. Office visit on Sat  if not better or if worse.  Follow-up by: Tresa Garter MD,  October 05, 2009 1:06 PM  Additional Follow-up for Phone Call Additional follow up Details #1::        Pt c/o sinus pressure, congestion, h/a, productive cough w/ yellow green mucus. She has been taking mucinex and robitiussin with little relief. Pt is unable to come into saturday clinic b/c she will be going out of town. Additional Follow-up by: Lamar Sprinkles, CMA,  October 05, 2009 4:03 PM    Additional Follow-up for Phone Call Additional follow up Details #2::    OK Zpac Follow-up by: Tresa Garter MD,  October 05, 2009 4:58 PM  Additional Follow-up for Phone Call Additional follow up Details #3:: Details for Additional Follow-up Action Taken: Pt informed, left vm on cell # Additional Follow-up by: Lamar Sprinkles, CMA,  October 05, 2009 6:27 PM  New/Updated Medications: ZITHROMAX Z-PAK 250 MG TABS (AZITHROMYCIN) as dirrected Prescriptions: ZITHROMAX Z-PAK 250 MG TABS (AZITHROMYCIN) as dirrected  #1 x 0   Entered and Authorized by:   Tresa Garter MD   Signed by:   Lamar Sprinkles, CMA on 10/05/2009   Method used:   Electronically to        [id number]* (retail)       [address] [place], [place]  [address]       Ph: [phone]       Fax: [phone]   RxID:   [id number]

## 2010-10-08 NOTE — Progress Notes (Signed)
Summary: REQ FOR ALT RX  Phone Note Call from Patient Call back at [phone]   Summary of Call: Pt continues to c/o sinus pain & pressure. Completed zpak w/no relief. Any further advise? Sudafed otc did not help.  Initial call taken by: Lamar Sprinkles, CMA,  August 13, 2010 4:07 PM  Follow-up for Phone Call        Use the Sinus rinse as needed or a Netti pot at bedtime OV if sick - any MD pls Follow-up by: Tresa Garter MD,  August 13, 2010 6:00 PM  Additional Follow-up for Phone Call Additional follow up Details #1::        left mess to call office back.............Marland KitchenLamar Sprinkles, CMA  August 13, 2010 6:28 PM   Pt informed  Additional Follow-up by: Lamar Sprinkles, CMA,  August 14, 2010 12:26 PM

## 2010-10-08 NOTE — Assessment & Plan Note (Signed)
Summary: HEADACHES/NWS   Vital Signs:  Patient profile:   30 year old female Height:      64 inches Weight:      197 pounds BMI:     33.94 Temp:     98.4 degrees F oral Pulse rate:   84 / minute Pulse rhythm:   regular Resp:     16 per minute BP sitting:   118 / 82  (right arm) Cuff size:   regular  Vitals Entered By: Lanier Prude, CMA(AAMA) (July 10, 2010 2:59 PM) CC: HA and Left leg pain X 2 weeks Is Patient Diabetic? No Comments pt is not taking Iron, Dexilant, VitC or Zyrtec.  Please remove from list   Primary Care Provider:  Sonda Primes, MD  CC:  HA and Left leg pain X 2 weeks.  History of Present Illness: C/o L HAs x 2 wks w/o n/v Just had a baby 3 wks ago. She is breastfeeding now C/o L ankle pain  Current Medications (verified): 1)  Iron 325 (65 Fe) Mg Tabs (Ferrous Sulfate) .[name].. 1 By Mouth Bid 2)  Dexilant 60 Mg Cpdr (Dexlansoprazole) .... Take 1 By Mouth Qd 3)  Vitamin C 500 Mg Tabs (Ascorbic Acid) .... Take 1 By Mouth Qd 4)  Zyrtec Allergy 10 Mg Caps (Cetirizine Hcl) .[name].. 1 By Mouth Once Daily 5)  Prenatabs Fa  Tabs (Prenatal Vit-Fe Fumarate-Fa) .[name].. 1 By Mouth Once Daily  Allergies (verified): 1)  ! Iodine (Iodine) 2)  Cephalexin  Past History:  Past Medical History: Last updated: 03/09/2009 Asthma/pseudoasthma Anxiety/ panic attacks GERD hx of H pylori tx + serology 2008 Allergic rhinitis migraine chronic constipation Anemia-NOS  Social History: Last updated: 07/10/2010 Occupation: [employer] married  in Oct 2009 Son born in 2009/, 3 children Never Smoked Alcohol use-no  Social History: Occupation: [employer] married  in Oct 2009 Son born in 2009/, 3 children Never Smoked Alcohol use-no  Review of Systems  The patient denies fever.    Physical Exam  General:  alert, well-developed, well-nourished, and cooperative to examination.    Nose:  no nasal discharge.   Mouth:  teeth and gums in good repair; mucous  membranes moist, without lesions or ulcers. oropharynx clear without exudate, erythema.  Neck:  L trap is tender Lungs:  normal respiratory effort, no intercostal retractions or use of accessory muscles; normal breath sounds bilaterally - no crackles and no wheezes.    Heart:  normal rate, regular rhythm, no murmur, and no rub. BLE without edema. Msk:  R, L distal shin and ankle - NT   Impression & Recommendations:  Problem # 1:  HEADACHE (ICD-784.0) L - tension Assessment New See "Patient Instructions".  Her updated medication list for this problem includes:    Ibuprofen 600 Mg Tabs (Ibuprofen) .[name]... 1 by mouth bid  pc x 1 wk then as needed for  pain  Problem # 2:  LEG PAIN (ICD-729.5) L - MSK Assessment: New  Complete Medication List: 1)  Iron 325 (65 Fe) Mg Tabs (Ferrous sulfate) .[name].. 1 by mouth bid 2)  Dexilant 60 Mg Cpdr (Dexlansoprazole) .... Take 1 by mouth qd 3)  Vitamin C 500 Mg Tabs (Ascorbic acid) .... Take 1 by mouth qd 4)  Zyrtec Allergy 10 Mg Caps (Cetirizine hcl) .[name].. 1 by mouth once daily 5)  Prenatabs Fa Tabs (Prenatal vit-fe fumarate-fa) .[name].. 1 by mouth once daily 6)  Vitamin D 1000 Unit Tabs (Cholecalciferol) .Marland KitchenMarland KitchenMarland Kitchen 1  by mouth qd 7)  Ibuprofen 600 Mg Tabs (Ibuprofen) .[name].. 1 by mouth bid  pc x 1 wk then as needed for  pain  Patient Instructions: 1)  Call if you are not better in a reasonable amount of time or if worse. 2)  Contour pillow  3)  Please schedule a follow-up appointment in 3 months well w/labs. Prescriptions: IBUPROFEN 600 MG TABS (IBUPROFEN) 1 by mouth bid  pc x 1 wk then as needed for  pain  #60 x 1   Entered and Authorized by:   Tresa Garter MD   Signed by:   Tresa Garter MD on 07/10/2010   Method used:   Print then Give to Patient   RxID:   [id number]    Orders Added: 1)  Est. Patient Level III [[address]]

## 2010-10-08 NOTE — Progress Notes (Signed)
Summary: Zyrtec  Phone Note Call from Patient Call back at Work Phone [phone]   Summary of Call: Patient is requesting rx, need to call pt & inform that it is complete.  Initial call taken by: Lamar Sprinkles, CMA,  October 02, 2009 1:03 PM  Follow-up for Phone Call        Patient notified Follow-up by: Rock Nephew CMA,  October 02, 2009 1:15 PM    Prescriptions: ZYRTEC ALLERGY 10 MG CAPS (CETIRIZINE HCL) 1 by mouth once daily  #90 x 3   Entered by:   Lamar Sprinkles, CMA   Authorized by:   Tresa Garter MD   Signed by:   Lamar Sprinkles, CMA on 10/02/2009   Method used:   Electronically to        [facility]* (retail)       [address]       [address] Shipping/mailing       [place], [place]  [address]       Ph: [phone]       Fax: [phone]   RxID:   [id number]

## 2010-10-09 ENCOUNTER — Other Ambulatory Visit: Payer: Self-pay | Admitting: Internal Medicine

## 2010-10-10 ENCOUNTER — Ambulatory Visit (HOSPITAL_COMMUNITY)
Admission: RE | Admit: 2010-10-10 | Discharge: 2010-10-10 | Disposition: A | Discharge status: Home / Self Care | Payer: Commercial Managed Care - PPO | Source: Ambulatory Visit | Attending: Internal Medicine | Admitting: Internal Medicine

## 2010-10-10 ENCOUNTER — Telehealth: Payer: Self-pay | Admitting: Internal Medicine

## 2010-10-10 ENCOUNTER — Other Ambulatory Visit: Payer: Self-pay | Admitting: Internal Medicine

## 2010-10-10 DIAGNOSIS — Q619 Cystic kidney disease, unspecified: Secondary | ICD-10-CM | POA: Insufficient documentation

## 2010-10-10 DIAGNOSIS — R109 Unspecified abdominal pain: Secondary | ICD-10-CM | POA: Insufficient documentation

## 2010-10-10 NOTE — Assessment & Plan Note (Signed)
Summary: SORE THROAT--DR AVP PT/REQ EARLIER SLOT THAN MD HAS  STC   Vital Signs:  Patient profile:   30 year old female Menstrual status:  regular LMP:     08/20/2010 Height:      64 inches (162.56 cm) Weight:      194 pounds (88.18 kg) BMI:     33.42 O2 Sat:      94 % on Room air Temp:     100.2 degrees F (37.89 degrees C) oral Pulse rate:   123 / minute Pulse rhythm:   irregular Resp:     20 per minute BP sitting:   102 / 72  (left arm) Cuff size:   large  Vitals Entered By: Brenton Grills CMA Duncan Dull) (September 04, 2010 2:03 PM)  Nutrition Counseling: Patient's BMI is greater than 25 and therefore counseled on weight management options.  O2 Flow:  Room air CC: Sore throat, cough, congestion, yellowish mucus, fever x 2 days/aj, URI symptoms Is Patient Diabetic? No Pain Assessment Patient in pain? no      Comments Pt is no longer taking Dexilant or Zyrtec LMP (date): 08/20/2010 LMP - Character: normal     Menstrual Status regular Enter LMP: 08/20/2010   Primary Care Provider:  Sonda Primes, MD  CC:  Sore throat, cough, congestion, yellowish mucus, fever x 2 days/aj, and URI symptoms.  History of Present Illness:  URI Symptoms      This is a 30 year old woman who presents with URI symptoms.  The symptoms began 2 days ago.  The severity is described as moderate.  The patient reports nasal congestion, clear nasal discharge, sore throat, and dry cough, but denies purulent nasal discharge, productive cough, earache, and sick contacts.  Associated symptoms include dyspnea and wheezing.  The patient denies fever, stiff neck, rash, vomiting, diarrhea, use of an antipyretic, and response to antipyretic.  The patient denies headache, muscle aches, and severe fatigue.  The patient denies the following risk factors for Strep sinusitis: unilateral facial pain, unilateral nasal discharge, poor response to decongestant, double sickening, tooth pain, Strep exposure, tender adenopathy,  and absence of cough.    Preventive Screening-Counseling & Management  Alcohol-Tobacco     Alcohol drinks/day: 0     Alcohol Counseling: not indicated; patient does not drink     Smoking Status: never     Passive Smoke Exposure: no     Tobacco Counseling: not indicated; no tobacco use  Hep-HIV-STD-Contraception     Hepatitis Risk: no risk noted     HIV Risk: no     STD Risk: no risk noted      Drug Use:  no.    Clinical Review Panels:  Immunizations   Last Flu Vaccine:  Fluvax 3+ (05/24/2008)  Lipid Management   Cholesterol:  155 (10/19/2007)   LDL (bad choesterol):  97 (10/19/2007)   HDL (good cholesterol):  42.6 (10/19/2007)  Diabetes Management   Creatinine:  0.7 (05/16/2009)   Last Flu Vaccine:  Fluvax 3+ (05/24/2008)  CBC   WBC:  8.6 (12/27/2009)   RBC:  4.02 (12/27/2009)   Hgb:  11.9 (12/27/2009)   Hct:  35.1 (12/27/2009)   Platelets:  184.0 (12/27/2009)   MCV  87.3 (12/27/2009)   MCHC  34.0 (12/27/2009)   RDW  13.8 (12/27/2009)   PMN:  67.9 (12/27/2009)   Lymphs:  20.6 (12/27/2009)   Monos:  4.8 (12/27/2009)   Eosinophils:  6.1 (12/27/2009)   Basophil:  0.6 (12/27/2009)  Complete Metabolic  Panel   Glucose:  72 (12/27/2009)   Sodium:  139 (05/16/2009)   Potassium:  4.2 (05/16/2009)   Chloride:  104 (05/16/2009)   CO2:  28 (05/16/2009)   BUN:  13 (05/16/2009)   Creatinine:  0.7 (05/16/2009)   Albumin:  3.9 (05/16/2009)   Total Protein:  7.9 (05/16/2009)   Calcium:  9.2 (05/16/2009)   Total Bili:  0.9 (05/16/2009)   Alk Phos:  52 (05/16/2009)   SGPT (ALT):  15 (05/16/2009)   SGOT (AST):  21 (05/16/2009)   Medications Prior to Update: 1)  Iron 325 (65 Fe) Mg Tabs (Ferrous Sulfate) .[name].. 1 By Mouth Bid 2)  Dexilant 60 Mg Cpdr (Dexlansoprazole) .... Take 1 By Mouth Qd 3)  Vitamin C 500 Mg Tabs (Ascorbic Acid) .... Take 1 By Mouth Qd 4)  Zyrtec Allergy 10 Mg Caps (Cetirizine Hcl) .[name].. 1 By Mouth Once Daily 5)  Prenatabs Fa  Tabs (Prenatal Vit-Fe  Fumarate-Fa) .[name].. 1 By Mouth Once Daily 6)  Vitamin D 1000 Unit Tabs (Cholecalciferol) .[name].. 1 By Mouth Qd 7)  Ibuprofen 600 Mg Tabs (Ibuprofen) .[name].. 1 By Mouth Bid  Pc X 1 Wk Then As Needed For  Pain  Current Medications (verified): 1)  Iron 325 (65 Fe) Mg Tabs (Ferrous Sulfate) .[name].. 1 By Mouth Bid 2)  Dexilant 60 Mg Cpdr (Dexlansoprazole) .... Take 1 By Mouth Qd 3)  Vitamin C 500 Mg Tabs (Ascorbic Acid) .... Take 1 By Mouth Qd 4)  Zyrtec Allergy 10 Mg Caps (Cetirizine Hcl) .[name].. 1 By Mouth Once Daily 5)  Prenatabs Fa  Tabs (Prenatal Vit-Fe Fumarate-Fa) .[name].. 1 By Mouth Once Daily 6)  Vitamin D 1000 Unit Tabs (Cholecalciferol) .[name].. 1 By Mouth Qd 7)  Ibuprofen 600 Mg Tabs (Ibuprofen) .[name].. 1 By Mouth Bid  Pc X 1 Wk Then As Needed For  Pain 8)  Guiatuss Ac 100-10 Mg/37ml Syrp (Guaifenesin-Codeine) .... 5-10 Ml By Mouth Qid As Needed For Cough 9)  Symbicort 160-4.5 Mcg/act Aero (Budesonide-Formoterol Fumarate) .... 2 Puffs Bid  Allergies (verified): 1)  ! Iodine (Iodine) 2)  Cephalexin  Past History:  Past Medical History: Last updated: 03/09/2009 Asthma/pseudoasthma Anxiety/ panic attacks GERD hx of H pylori tx + serology 2008 Allergic rhinitis migraine chronic constipation Anemia-NOS  Past Surgical History: Last updated: 11/25/2007 Denies surgical history  Family History: Last updated: 03/29/2008 Family History Hypertension grandmother with DM  Social History: Last updated: 07/10/2010 Occupation: [employer] married  in Oct 2009 Son born in 2009/, 3 children Never Smoked Alcohol use-no  Risk Factors: Alcohol Use: 0 (09/04/2010) Exercise: no (05/26/2008)  Risk Factors: Smoking Status: never (09/04/2010) Passive Smoke Exposure: no (09/04/2010)  Family History: Reviewed history from 03/29/2008 and no changes required. Family History Hypertension grandmother with DM  Social History: Reviewed history from 07/10/2010 and no changes required. Occupation: [employer] married  in Oct 2009 Son born in 2009/, 3 children Never Smoked Alcohol use-no Hepatitis Risk:  no risk noted STD Risk:  no risk noted  Review of Systems  The patient denies anorexia, fever, weight loss, chest pain, syncope, peripheral edema, headaches, hemoptysis, abdominal pain, hematuria, suspicious skin lesions, and enlarged lymph nodes.    Physical Exam  General:  alert, well-developed, well-nourished, and cooperative to examination.    Ears:  R ear normal and L ear normal.   Nose:  External nasal examination shows no deformity or inflammation. Nasal mucosa are pink and moist without lesions or exudates. Mouth:  Oral mucosa and oropharynx without lesions or exudates.  Teeth in good repair. Neck:  supple, full ROM, no masses, no thyromegaly, no thyroid nodules or tenderness, no JVD, normal carotid upstroke, and no carotid bruits.   Lungs:  She has diffuse bilateral inspir and expir wheezes and good air movement, there are no rales, decreased breath sounds, or egophony. Heart:  normal rate, regular rhythm, no murmur, no gallop, no rub, and no JVD.   Abdomen:  soft, non-tender, normal bowel sounds, no distention, no masses, no guarding, no rigidity, no rebound tenderness, no abdominal hernia, no inguinal hernia, no hepatomegaly, and no splenomegaly.   Msk:  No deformity or scoliosis noted of thoracic or lumbar spine.   Pulses:  R and L carotid,radial,femoral,dorsalis pedis and posterior tibial pulses are full and equal bilaterally Extremities:  No clubbing, cyanosis, edema, or deformity noted with normal full range of motion of all joints.   Neurologic:  No cranial nerve deficits noted. Station and gait are normal. Plantar reflexes are down-going bilaterally. DTRs are symmetrical throughout. Sensory, motor and coordinative functions appear intact. Skin:  Intact without suspicious lesions or rashes Cervical Nodes:  no anterior cervical adenopathy and no posterior cervical  adenopathy.   Axillary Nodes:  no R axillary adenopathy and no L axillary adenopathy.   Psych:  Cognition and judgment appear intact. Alert and cooperative with normal attention span and concentration. No apparent delusions, illusions, hallucinations Additional Exam:  She received a jet neb of ventolin 2.5 mg and a dose of depo-medrol IM and afterwards her lungs have very few wheezes and very good air movement with no rhonchi.   Impression & Recommendations:  Problem # 1:  INTRINSIC ASTHMA, WITH EXACERBATION (EAV-409.81) Assessment New  Her updated medication list for this problem includes:    Symbicort 160-4.5 Mcg/act Aero (Budesonide-formoterol fumarate) .[name]... 2 puffs bid  Pulmonary Functions Reviewed: O2 sat: 94 (09/04/2010)  Problem # 2:  COUGH (ICD-786.2) Assessment: New will check for pna Orders: T-2 View CXR (71020TC)  Complete Medication List: 1)  Iron 325 (65 Fe) Mg Tabs (Ferrous sulfate) .[name].. 1 by mouth bid 2)  Dexilant 60 Mg Cpdr (Dexlansoprazole) .... Take 1 by mouth qd 3)  Vitamin C 500 Mg Tabs (Ascorbic acid) .... Take 1 by mouth qd 4)  Zyrtec Allergy 10 Mg Caps (Cetirizine hcl) .[name].. 1 by mouth once daily 5)  Prenatabs Fa Tabs (Prenatal vit-fe fumarate-fa) .[name].. 1 by mouth once daily 6)  Vitamin D 1000 Unit Tabs (Cholecalciferol) .[name].. 1 by mouth qd 7)  Ibuprofen 600 Mg Tabs (Ibuprofen) .[name].. 1 by mouth bid  pc x 1 wk then as needed for  pain 8)  Guiatuss Ac 100-10 Mg/48ml Syrp (Guaifenesin-codeine) .... 5-10 ml by mouth qid as needed for cough 9)  Symbicort 160-4.5 Mcg/act Aero (Budesonide-formoterol fumarate) .... 2 puffs bid  Other Orders: Nebulizer Tx (19147)  Patient Instructions: 1)  Please schedule a follow-up appointment in 2 weeks. 2)  Get plenty of rest, drink lots of clear liquids, and use Tylenol or Ibuprofen for fever and comfort. Return in 7-10 days if you're not better:sooner if you're feeling worse. Prescriptions: SYMBICORT 160-4.5 MCG/ACT AERO  (BUDESONIDE-FORMOTEROL FUMARATE) 2 puffs BID  #7 inhs x 0   Entered and Authorized by:   Etta Grandchild MD   Signed by:   Etta Grandchild MD on 09/04/2010   Method used:   Samples Given   RxID:   [id number] GUIATUSS AC 100-10 MG/5ML SYRP (GUAIFENESIN-CODEINE) 5-10 ml by  mouth QID as needed for cough  #8 ounces x 0   Entered and Authorized by:   Etta Grandchild MD   Signed by:   Etta Grandchild MD on 09/04/2010   Method used:   Print then Give to Patient   RxID:   [id number]    Orders Added: 1)  Nebulizer Tx [14782] 2)  T-2 View CXR [71020TC] 3)  Est. Patient Level IV [95621]  Appended Document: SORE THROAT--DR AVP PT/REQ EARLIER SLOT THAN MD HAS  STC     Allergies: 1)  ! Iodine (Iodine) 2)  Cephalexin   Complete Medication List: 1)  Iron 325 (65 Fe) Mg Tabs (Ferrous sulfate) .[name].. 1 by mouth bid 2)  Dexilant 60 Mg Cpdr (Dexlansoprazole) .... Take 1 by mouth qd 3)  Vitamin C 500 Mg Tabs (Ascorbic acid) .... Take 1 by mouth qd 4)  Zyrtec Allergy 10 Mg Caps (Cetirizine hcl) .[name].. 1 by mouth once daily 5)  Prenatabs Fa Tabs (Prenatal vit-fe fumarate-fa) .[name].. 1 by mouth once daily 6)  Vitamin D 1000 Unit Tabs (Cholecalciferol) .[name].. 1 by mouth qd 7)  Ibuprofen 600 Mg Tabs (Ibuprofen) .[name].. 1 by mouth bid  pc x 1 wk then as needed for  pain 8)  Guiatuss Ac 100-10 Mg/45ml Syrp (Guaifenesin-codeine) .... 5-10 ml by mouth qid as needed for cough 9)  Symbicort 160-4.5 Mcg/act Aero (Budesonide-formoterol fumarate) .... 2 puffs bid  Other Orders: Admin of Therapeutic Inj  intramuscular or subcutaneous (30865) Depo- Medrol 80mg  (J1040) Depo- Medrol 40mg  (J1030)   Medication Administration  Injection # 1:    Medication: Depo- Medrol 80mg     Diagnosis: INTRINSIC ASTHMA, WITH EXACERBATION (HQI-696.29)    Route: IM    Site: L deltoid    Exp Date: 03/2013    Lot #: [id number]    Mfr: Pharmacia    Patient tolerated injection without complications    Given by: Brenton Grills  CMA Duncan Dull) (September 04, 2010 3:43 PM)  Injection # 2:    Medication: Depo- Medrol 40mg     Diagnosis: INTRINSIC ASTHMA, WITH EXACERBATION [phone])    Route: IM    Site: L deltoid    Exp Date: 03/2013    Lot #: [id number]    Mfr: Pharmacia    Comments: administered 120mg  (1.5mg )  of Depo Medrol    Patient tolerated injection without complications    Given by: Brenton Grills CMA Duncan Dull) (September 04, 2010 3:43 PM)  Orders Added: 1)  Admin of Therapeutic Inj  intramuscular or subcutaneous [96372] 2)  Depo- Medrol 80mg  [J1040] 3)  Depo- Medrol 40mg  [J1030]

## 2010-10-10 NOTE — Letter (Signed)
Summary: Su Philomena Doheny MD  Su Philomena Doheny MD   Imported By: Sherian Rein 09/06/2010 08:56:39  _____________________________________________________________________  External Attachment:    Type:   Image     Comment:   External Document

## 2010-10-10 NOTE — Progress Notes (Signed)
Summary: Scratchy Throat  Phone Note Call from Patient   Summary of Call: Pt c/o clear post nasal drip & scratchy throat. No fever, chills or other symptoms. She is not taking allergy meds. Advised she try allergy meds, nettie pot and sudafed as previously advised. Also to check w/peds or pharmacist regarding what otc it ok b/c she is breast feeding. She agreed and will call back or come in for eval if symptoms persist, become worse or change.  Initial call taken by: Lamar Sprinkles, CMA,  September 03, 2010 2:40 PM  Follow-up for Phone Call        agree Thank you!  Follow-up by: Tresa Garter MD,  September 03, 2010 3:12 PM

## 2010-10-11 ENCOUNTER — Ambulatory Visit: Admit: 2010-10-11 | Payer: Self-pay | Admitting: Internal Medicine

## 2010-10-11 ENCOUNTER — Ambulatory Visit (HOSPITAL_COMMUNITY): Admission: RE | Admit: 2010-10-11 | Payer: Self-pay | Source: Ambulatory Visit

## 2010-10-11 ENCOUNTER — Encounter (INDEPENDENT_AMBULATORY_CARE_PROVIDER_SITE_OTHER): Payer: Commercial Managed Care - PPO | Admitting: Internal Medicine

## 2010-10-11 ENCOUNTER — Encounter: Payer: Self-pay | Admitting: Internal Medicine

## 2010-10-11 DIAGNOSIS — Z0000 Encounter for general adult medical examination without abnormal findings: Secondary | ICD-10-CM

## 2010-10-11 DIAGNOSIS — G4700 Insomnia, unspecified: Secondary | ICD-10-CM | POA: Insufficient documentation

## 2010-10-16 NOTE — Progress Notes (Signed)
Summary: [place] ok start anti-spasmodic   Phone Note Outgoing Call   Summary of Call: I explained that [place] was ok -no problems seen and no gallstones. will try anti-spasmodic Tx want to Rx hyoscyamine 0.125 milligrams Sl 1-2 before meals #90 1 refill PLEASE SEE IF ON [facility] FORMULARY AND RX IF SO AS I AM UNABLE TO TELL TONIGHT Iva Boop MD, [facility]  October 10, 2010 5:44 PM    Follow-up for Phone Call        unable to send RX at this time.  patient aware I will try again this afernoon.   Follow-up by: Darcey Nora RN, CGRN,  October 11, 2010 10:42 AM  Additional Follow-up for Phone Call Additional follow up Details #1::        Rx called into pharmacy.  Patient's chart is locked and unable to send RX electronically. I have called it into [facility] pharmacy.  Patient aware. Additional Follow-up by: Darcey Nora RN, CGRN,  October 11, 2010 4:33 PM

## 2010-10-16 NOTE — Progress Notes (Signed)
Summary: Abd Pain  Medications Added IBUPROFEN 200 MG TABS (IBUPROFEN) 1-2 as needed for  pain SYMBICORT 160-4.5 MCG/ACT AERO (BUDESONIDE-FORMOTEROL FUMARATE) 2 puffs two times a day as needed MIRALAX   POWD (POLYETHYLENE GLYCOL 3350) 1 dose daily (17 grams in 8 oz liquid) PROMETHAZINE HCL 25 MG  TABS (PROMETHAZINE HCL) 1 by mouth as needed nausea and vomiting       Phone Note Call from Patient Call back at Work Phone [phone]   Call For: Dr Leone Payor Summary of Call: Abd Pain, nausea w/vomitting after eating x2wks. Initial call taken by: Leanor Kail [facility],  October 08, 2010 3:41 PM  Follow-up for Phone Call        RUQ pain after eating x 2 weeks, moreso with fatty foods no fevers bowels moving on MiraLax I will follow-up with her in office tomorrow Follow-up by: Iva Boop MD, Clementeen Graham,  October 08, 2010 5:27 PM  Additional Follow-up for Phone Call Additional follow up Details #1::        She is having RUQ pain and epigastric pan 15 mins to 2 hours after eating with some nausea and vomting. She has had this for about 1 moth. Heartburn ok on Dexilant. Some mild constipaton despite MiraLax BP 110/62 Mld lower abdominal tenderness on exam   New Problems: NAUSEA AND VOMITING (ICD-787.01) RUQ PAIN (ICD-789.01)   New Problems: NAUSEA AND VOMITING (ICD-787.01) RUQ PAIN (ICD-789.01) New/Updated Medications: IBUPROFEN 200 MG TABS (IBUPROFEN) 1-2 as needed for  pain SYMBICORT 160-4.5 MCG/ACT AERO (BUDESONIDE-FORMOTEROL FUMARATE) 2 puffs two times a day as needed MIRALAX   POWD (POLYETHYLENE GLYCOL 3350) 1 dose daily (17 grams in 8 oz liquid) PROMETHAZINE HCL 25 MG  TABS (PROMETHAZINE HCL) 1 by mouth as needed nausea and vomiting  Current Allergies: ! IODINE (IODINE) CEPHALEXIN Updated/Current Medications (including changes made in today's visit):  DEXILANT 60 MG CPDR (DEXLANSOPRAZOLE) take 1 by mouth qd VITAMIN C 500 MG TABS (ASCORBIC ACID) take 1 by mouth qd PRENATABS  FA  TABS (PRENATAL VIT-FE FUMARATE-FA) 1 by mouth once daily VITAMIN D 1000 UNIT TABS (CHOLECALCIFEROL) 1 by mouth qd IBUPROFEN 200 MG TABS (IBUPROFEN) 1-2 as needed for  pain SYMBICORT 160-4.5 MCG/ACT AERO (BUDESONIDE-FORMOTEROL FUMARATE) 2 puffs two times a day as needed MIRALAX   POWD (POLYETHYLENE GLYCOL 3350) 1 dose daily (17 grams in 8 oz liquid) PROMETHAZINE HCL 25 MG  TABS (PROMETHAZINE HCL) 1 by mouth as needed nausea and vomiting    Impression & Recommendations:  Problem # 1:  RUQ PAIN (ICD-789.01) Assessment New  LFT's ok 1/27 Needs an abd [place] to evaluate at risk for gallstones and sounds like the problem  Orders: Ultrasound Abdomen (UAS)  Problem # 2:  NAUSEA AND VOMITING (ICD-787.01) Assessment: New  Orders: Ultrasound Abdomen (UAS)  Problem # 3:  IBS (ICD-564.1) Assessment: Deteriorated mildly worse constipation she may try some occasonbal mag citrate can also increase MiraLax to two times a day or occasional Senokot or Dulcolax  Patient Instructions: 1)  Your Abdominal Ultrasound is scheduled on 10/09/2010 at 9am 2)  Nothing to eat or drink after midnight 3)  Start Miralax two times a day  4)  And take an occasional Ducolax 5)  The medication list was reviewed and reconciled.  All changed / newly prescribed medications were explained.  A complete medication list was provided to the patient / caregiver.

## 2010-10-24 NOTE — Assessment & Plan Note (Signed)
Summary: CPX  --[employer]   Vital Signs:  Patient profile:   30 year old female Menstrual status:  regular Height:      64 inches Weight:      193 pounds BMI:     33.25 Temp:     98.8 degrees F oral Pulse rate:   92 / minute Pulse rhythm:   regular Resp:     16 per minute BP sitting:   100 / 60  (left arm) Cuff size:   large  Vitals Entered By: Lanier Prude, CMA(AAMA) (October 11, 2010 8:51 AM) CC: CPX Is Patient Diabetic? No   Primary Care Provider:  Sonda Primes, MD  CC:  CPX.  History of Present Illness: The patient presents for a preventive health examination  C/o eye twitching C/o B knee pain C/o fatigue and poor sleep - 72 yo son comes in to sleep with herevery night  Current Medications (verified): 1)  Dexilant 60 Mg Cpdr (Dexlansoprazole) .... Take 1 By Mouth Qd 2)  Vitamin C 500 Mg Tabs (Ascorbic Acid) .... Take 1 By Mouth Qd 3)  Prenatabs Fa  Tabs (Prenatal Vit-Fe Fumarate-Fa) .[name].. 1 By Mouth Once Daily 4)  Vitamin D 1000 Unit Tabs (Cholecalciferol) .[name].. 1 By Mouth Qd 5)  Ibuprofen 200 Mg Tabs (Ibuprofen) .[name].. 1-2 As Needed For  Pain 6)  Symbicort 160-4.5 Mcg/act Aero (Budesonide-Formoterol Fumarate) .... 2 Puffs Two Times A Day As Needed 7)  Miralax   Powd (Polyethylene Glycol 3350) .[name].. 1 Dose Daily (17 Grams in 8 Oz Liquid) 8)  Promethazine Hcl 25 Mg  Tabs (Promethazine Hcl) .[name].. 1 By Mouth As Needed Nausea and Vomiting  Allergies (verified): 1)  ! Iodine (Iodine) 2)  Cephalexin  Past History:  Past Medical History: Last updated: 03/09/2009 Asthma/pseudoasthma Anxiety/ panic attacks GERD hx of H pylori tx + serology 2008 Allergic rhinitis migraine chronic constipation Anemia-NOS  Past Surgical History: Last updated: 11/25/2007 Denies surgical history  Family History: Last updated: 03/29/2008 Family History Hypertension grandmother with DM  Social History: Last updated: 07/10/2010 Occupation: [employer] married  in Oct 2009 Son  born in 2009/, 3 children Never Smoked Alcohol use-no  Review of Systems  The patient denies weight loss, abdominal pain, difficulty walking, anorexia, fever, weight gain, vision loss, decreased hearing, hoarseness, chest pain, syncope, dyspnea on exertion, peripheral edema, prolonged cough, headaches, hemoptysis, melena, hematochezia, severe indigestion/heartburn, hematuria, incontinence, genital sores, muscle weakness, suspicious skin lesions, transient blindness, depression, unusual weight change, abnormal bleeding, enlarged lymph nodes, angioedema, and breast masses.    Physical Exam  General:  alert, well-developed, well-nourished, and cooperative to examination.    Head:  Normocephalic and atraumatic without obvious abnormalities. No apparent alopecia or balding. Eyes:  vision grossly intact; pupils equal, round and reactive to light.  conjunctiva and lids normal.    Ears:  External ear exam shows no significant lesions or deformities.  Otoscopic examination reveals clear canals, tympanic membranes are intact bilaterally without bulging, retraction, inflammation or discharge. Hearing is grossly normal bilaterally. Nose:  External nasal examination shows no deformity or inflammation. Nasal mucosa are pink and moist without lesions or exudates. Mouth:  Oral mucosa and oropharynx without lesions or exudates.  Teeth in good repair. Neck:  supple, full ROM, no masses, no thyromegaly, no thyroid nodules or tenderness, no JVD, normal carotid upstroke, and no carotid bruits.   Lungs:  Normal respiratory effort, chest expands symmetrically. Lungs are clear to auscultation, no crackles or wheezes. Heart:  normal rate, regular rhythm, no murmur, no gallop, no rub, and no JVD.   Abdomen:  soft, non-tender, normal bowel sounds, no distention, no masses, no guarding, no rigidity, no rebound tenderness, no abdominal hernia, no inguinal hernia, no hepatomegaly, and no splenomegaly.   Msk:  No deformity or  scoliosis noted of thoracic or lumbar spine.   Extremities:  No clubbing, cyanosis, edema, or deformity noted with normal full range of motion of all joints.   Neurologic:  No cranial nerve deficits noted. Station and gait are normal. Plantar reflexes are down-going bilaterally. DTRs are symmetrical throughout. Sensory, motor and coordinative functions appear intact. Skin:  Intact without suspicious lesions or rashes Psych:  Cognition and judgment appear intact. Alert and cooperative with normal attention span and concentration. No apparent delusions, illusions, hallucinations   Impression & Recommendations:  Problem # 1:  HEALTH MAINTENANCE EXAM (ICD-V70.0) Assessment New Health and age related issues were discussed. Available screening tests and vaccinations were discussed as well. Healthy life style including good diet and exercise was discussed.  The labs were reviewed with the patient.  GYN q 12 months   Problem # 2:  INSOMNIA, CHRONIC (ICD-307.42) Assessment: New Discussed.  Problem # 3:  RUQ PAIN (ICD-789.01) Assessment: Unchanged Try gluten free diet F/u w/Dr Leone Payor  Complete Medication List: 1)  Dexilant 60 Mg Cpdr (Dexlansoprazole) .... Take 1 by mouth qd 2)  Vitamin C 500 Mg Tabs (Ascorbic acid) .... Take 1 by mouth qd 3)  Prenatabs Fa Tabs (Prenatal vit-fe fumarate-fa) .[name].. 1 by mouth once daily 4)  Vitamin D 1000 Unit Tabs (Cholecalciferol) .[name].. 1 by mouth qd 5)  Ibuprofen 200 Mg Tabs (Ibuprofen) .[name].. 1-2 as needed for  pain 6)  Symbicort 160-4.5 Mcg/act Aero (Budesonide-formoterol fumarate) .... 2 puffs two times a day as needed 7)  Miralax Powd (Polyethylene glycol 3350) .[name].. 1 dose daily (17 grams in 8 oz liquid) 8)  Promethazine Hcl 25 Mg Tabs (Promethazine hcl) .[name].. 1 by mouth as needed nausea and vomiting  Patient Instructions: 1)  Please schedule a follow-up appointment in 6 months.   Orders Added: 1)  Est. Patient 18-39 years [[id number]]

## 2010-11-21 ENCOUNTER — Telehealth: Payer: Self-pay | Admitting: Internal Medicine

## 2010-11-21 LAB — CBC
HCT: 29.6 % — ABNORMAL LOW (ref 36.0–46.0)
HCT: 32 % — ABNORMAL LOW (ref 36.0–46.0)
Hemoglobin: 10.7 g/dL — ABNORMAL LOW (ref 12.0–15.0)
MCH: 27.9 pg (ref 26.0–34.0)
MCHC: 33.3 g/dL (ref 30.0–36.0)
MCV: 84.1 fL (ref 78.0–100.0)
MCV: 84.2 fL (ref 78.0–100.0)
RBC: 3.52 MIL/uL — ABNORMAL LOW (ref 3.87–5.11)
RDW: 14 % (ref 11.5–15.5)
WBC: 13.3 10*3/uL — ABNORMAL HIGH (ref 4.0–10.5)

## 2010-11-26 NOTE — Progress Notes (Signed)
Summary: Rx request  Phone Note Call from Patient Call back at Work Phone [phone]   Caller: Patient [phone] Summary of Call: Pt states that she has a sinus infection w/yellow mucus and request a Rx for ZPack ABI and Rx for cough. Initial call taken by: Burnard Leigh [facility]),  November 21, 2010 12:39 PM  Follow-up for Phone Call        ok z pac Use over-the-counter medicines for "cold": Tylenol  650mg  or Advil 400mg  every 6 hours  for fever; Delsym or Robutussin for cough. Mucinex or Mucinex D for congestion. Ricola or Halls for sore throat. Office visit if not better or if worse.  Follow-up by: Tresa Garter MD,  November 21, 2010 1:10 PM  Additional Follow-up for Phone Call Additional follow up Details #1::        Pt informed  Additional Follow-up by: Lamar Sprinkles, CMA,  November 21, 2010 5:21 PM    New/Updated Medications: ZITHROMAX Z-PAK 250 MG TABS (AZITHROMYCIN) as directed Prescriptions: ZITHROMAX Z-PAK 250 MG TABS (AZITHROMYCIN) as directed  #1 x 0   Entered by:   Lamar Sprinkles, CMA   Authorized by:   Tresa Garter MD   Signed by:   Lamar Sprinkles, CMA on 11/21/2010   Method used:   Electronically to        [facility] [address] [id number] (retail)       [address] [place], [place]  [address]       Ph: [phone]       Fax: [phone]   RxID:   [id number]

## 2010-12-16 LAB — POCT I-STAT, CHEM 8
BUN: 8 mg/dL (ref 6–23)
Calcium, Ion: 1.29 mmol/L (ref 1.12–1.32)
Chloride: 104 mEq/L (ref 96–112)
Glucose, Bld: 85 mg/dL (ref 70–99)

## 2011-01-21 NOTE — Assessment & Plan Note (Signed)
[facility]                             PULMONARY OFFICE NOTE   Deanna Schwartz, Deanna Schwartz [facility]                    MRN:          [id number]  DATE:03/23/2007                            DOB:          Aug 05, 1981    HISTORY OF PRESENT ILLNESS:  The patient is a 30 year old female patient  of Dr. Teddy Spike who has a known history of reactive airways who presents  today for an acute office visit.  The patient is [redacted] weeks pregnant.  She  complains, over the last week, she has had nasal congestion, ear pain,  cough, and wheezing.  The patient denies any hemoptysis, orthopnea, PND,  or leg swelling.  The patient is currently maintained on prenatal  vitamins only.   PAST MEDICAL HISTORY:  Reviewed.   CURRENT MEDICATIONS:  Reviewed.   PHYSICAL EXAM:  The patient is a pleasant female in no acute distress.  She is afebrile with stable vital signs.  O2 saturation is 97% on room  air.  HEENT:  Nasal mucosa is erythematous.  Maxillary sinus tenderness.  Right TM is erythematous and bulging.  NECK:  Supple without cervical adenopathy.  No JVD.  LUNGS:  Reveal coarse breath sounds bilaterally with a few expiratory  wheezes.  The patient has some upper airway pseudo-wheezing.  CARDIAC:  Regular rate.  ABDOMEN:  Soft.  EXTREMITIES:  Warm without any edema.   IMPRESSION AND PLAN:  Acute rhinosinusitis and right otitis media.  The  patient is recommended to begin Omnicef x10 days.  Add in Mucinex DM  twice daily.  Saline nasal spray p.r.n.  The patient was given a Xopenex  nebulizer treatment in the office today.  As above, the patient is  pregnant.  Discussed medication risk factors and pregnancy categories  with this patient in detail.  The patient will return back here in 1  week or sooner if needed.  The patient is to contact our office if  symptoms do not improve or worsen for sooner followup, or seek emergency  room evaluation.      Rubye Oaks, NP  Electronically Signed      Barbaraann Share, MD,FCCP  Electronically Signed   TP/MedQ  DD: 03/23/2007  DT: 03/24/2007  Job #: [id number]

## 2011-01-21 NOTE — Discharge Summary (Signed)
Deanna Schwartz, Deanna Schwartz           ACCOUNT NO.:  [id number]   MEDICAL RECORD NO.:  [id number]          PATIENT TYPE:  INP   LOCATION:  9143                          FACILITY:  [facility]   PHYSICIAN:  Huel Cote, M.D. DATE OF BIRTH:  December 06, 1980   DATE OF ADMISSION:  09/24/2007  DATE OF DISCHARGE:  09/26/2007                               DISCHARGE SUMMARY   DISCHARGE DIAGNOSES:  1. Term pregnancy at 50 and five-sevenths weeks delivered.  2. Group B streptococcus positive.  3. Status post normal spontaneous vaginal delivery.   DISCHARGE MEDICATIONS:  1. Motrin 600 mg p.o. every 6 hours.  2. Percocet one to two tablets p.o. every 4 hours p.r.n.   DISCHARGE FOLLOWUP:  The patient is to follow up in the office in 6  weeks for her routine postpartum exam.   HOSPITAL COURSE:  The patient is a 30 year old G3, P1-0-1-1 who was  admitted at 86 and five-sevenths weeks gestation for induction of labor  given term status and a favorable cervix.  She is group B strep positive  and this is going to be treated on labor and delivery with penicillin.  Her prenatal labs are as follows:  B positive, antibody negative, RPR  nonreactive, rubella immune, hepatitis B surface antigen negative, HIV  negative, GC negative, chlamydia negative, group B strep positive, 1-  hour Glucola 117, and a first trimester screen was normal.  Past  obstetrical history:  In May 2007 she had a 40-week gestation vaginal  delivery, 7 pounds.  In 2008 she had one spontaneous miscarriage.  Past  GYN history:  None.  Past medical history:  Asthma, which is stable.  Past surgical history:  None.  She is afebrile with stable vital signs.  Fetal heart rate was reactive.  Cervix is 50, 2-3, and a -2 station.  She had rupture of membranes and scant fluid was noted at the time.  Her  penicillin was already going for her positive group B strep status.  She  reached complete dilation throughout the day, pushed well, and had a  normal  spontaneous vaginal delivery of a viable female infant over a first-  degree laceration.  Apgars were 9 and 9.  She was DeLee-suctioned after  delivery of the head for moderate meconium which had been noted prior to  delivery.  There was a nuchal cord x1 which reduced.  The placenta was  delivered spontaneously.  First-degree laceration was repaired for  hemostasis with 3-0 and 2-0 Vicryl.  Estimated blood loss was 350 mL.  Cervix and rectum were intact.  She was then admitted for routine  postpartum care.  She did quite well.  On postpartum day #2 her  hemoglobin was 10.7 and she was tired but her pain was well controlled  with p.o. medications and she was stable for discharge home.      Huel Cote, M.D.  Electronically Signed     KR/MEDQ  D:  09/26/2007  T:  09/26/2007  Job:  [id number]

## 2011-01-21 NOTE — Assessment & Plan Note (Signed)
[facility]                         GASTROENTEROLOGY OFFICE NOTE   BAYLIN, CABAL                    MRN:          [id number]  DATE:08/31/2007                            DOB:          1981/04/16    CHIEF COMPLAINT:  Abdominal pain.   Deanna Schwartz is [redacted] weeks pregnant.  The baby is fine and Dr. Senaida Ores has  told her the baby is engaged.  She has been having sharp abdominal  discomfort in the left upper quadrant area, stabbing intermittently,  usually lasting for seconds, but it is quite severe at times.  She is  also having some heartburn and reflux symptomatology.  There is no  nausea or vomiting.  Bowel habits are regular.  She can feel the baby  moving.  It is not necessarily related to that, i.e., the pain.  The  pain is not necessarily related to eating either.  It does not appear to  be positional.   MEDICATIONS:  Prenatal vitamins.  Tylenol p.r.n.  Nasal saline p.r.n.  Albuterol p.r.n.   She has not had any dysphagia.  No fevers or chills.  Asthma is under  control at this time.   PAST MEDICAL HISTORY:  Reviewed and unchanged.   Weight 224 pounds, pulse 108, blood pressure 108/60.   ASSESSMENT:  Left upper quadrant pain, some heartburn symptoms.  It is  hard to know what the sharp pain is at this time.  I think reflux is  possible.  It certainly could be related to pressure from the baby in  the abdominal area.   PLAN:  Trial of Prevacid 30 mg b.i.d.  Samples given to the patient.  She is 36 weeks.  I think the risk to the fetus is very low at this  point, and worth the therapy trial.  She will let me know how she does.     Iva Boop, MD,FACG  Electronically Signed    CEG/MedQ  DD: 08/31/2007  DT: 09/01/2007  Job #: [id number]   cc:   Huel Cote, M.D.

## 2011-01-21 NOTE — Assessment & Plan Note (Signed)
 [facility]                             PULMONARY OFFICE NOTE   [name], [name]                    MRN:          [id number]  DATE:03/27/2007                            DOB:          1980/11/19    PULMONARY EXTENDED ACUTE OFFICE EVALUATION.   HISTORY:  This is a 30 year old Hispanic female seen as a work-in today  for increasing symptoms of dyspnea for the last several days associated  with any activity other than sitting still.  She has noticed increasing  cough, congestion with thick yellow mucus.  Chart review indicates she  has evidence of chronic sinusitis and was referred to ENT but did not  keep followup back in 2007 and now comes in about [redacted] weeks pregnant with  having been seen on July 15 and already started on Omnicef and Xopenex  which she says does not help.   PHYSICAL EXAMINATION:  She is a pleasant, ambulatory, slightly anxious  Hispanic female in no acute distress, she is afebrile with normal vital  signs.  HEENT:  Moderate turbinate edema.  Oropharynx reveals mild cobblestoning  with minimum purulent postnasal drainage.  NECK:  Supple without cervical adenopathy or tenderness, trachea is  midline, no thyromegaly.  LUNG FIELDS:  Completely clear bilaterally to auscultation and  percussion with excellent air movement.  HEART:  Regular rate and rhythm without murmur, rub or gallop.  ABDOMEN:  Soft, benign.  EXTREMITIES:  Normal without any calf tenderness, cyanosis, clubbing or  edema.   Hemosaturation 98% on room air.   IMPRESSION:  Refractory coughing and wheezing related to upper airways  instability likely secondary to rhinitis, sinusitis.  I note that she  does have chronic sinusitis but has not been using anything for this  other than salt water irrigation which may not be adequate.   I recommend, therefore, a 10 day course of Augmentin, a 6 day course of  prednisone and followup per ENT.   In the meantime, if she  begins to wheeze she can use her Xopenex 2 puffs  q.4 p.r.n., for cough use Mucinex DM two b.i.d.   I did review with her optimal use of her MDI and do not feel a nebulizer  is needed, which was her initial request.  This is based on the fact  that she is not wheezing today before treatment and has excellent MDI  technique in the event that she needs it p.r.n.     Charlaine Dalton. [name], MD, [facility]  Electronically Signed   MBW/MedQ  DD: 03/27/2007  DT: 03/27/2007  Job #: [id number]

## 2011-01-21 NOTE — Assessment & Plan Note (Signed)
[facility]                            CARDIOLOGY OFFICE NOTE   Thamar, Holik [facility]                    MRN:          [id number]  DATE:03/27/2008                            DOB:          [birth year]    IDENTIFICATION:  Ms. Ruppert is a 30 year old woman I last saw her  actually in 2007 for palpitations.  Refer this for full further details.   She comes in today and I asked her how she was doing, she said after I  saw her in August 2007, she began getting better.  She did okay.  She  then got pregnant again and delivered in January.  About March, April,  she began having palpitations, worse in April.  She describes this with  some like isolated skips, denies dizziness.  They can last all day, but  again there are single beats.   On talking to her today, she notes she drinks 2 cups of coffee per day.  No other caffeinated drinks.   CURRENT MEDICINES:  1. Spiramycin (for skin infection).  2. Xopenex p.r.n. (eat very infrequently).   PHYSICAL EXAMINATION:  GENERAL:  The patient is in no distress.  VITAL SIGNS:  Blood pressure is 121/73, pulse is 83 and regular, weight  197.  NECK:  JVP is normal.  No bruits.  LUNGS:  Clear.  No rales or wheezes.  CARDIAC:  Regular rate and rhythm, S1 and S2.  No S3, S4 or murmurs.  ABDOMEN:  Benign.  EXTREMITIES:  Good pulses.  No edema.   REVIEW OF SYSTEMS:  The patient was seen in the emergency room on February 21, 2008, for the same thing and sent home.  Potassium was 3.7, TSH  1.78, T3 of 3.1.   IMPRESSION:  1. Palpitations, some like isolated probable PACs versus PVCs.  She      had normal echo in 2007, her symptoms were similar to what she had      before may be a little worse, it does not appear to be a sustained      tachyarrhythmia.   What I have recommended it is:  1. Refrain from caffeine.  2. Check electrolytes again (potassium, magnesium).  3. I have given her prescription for metoprolol ER.  She  can take 12.5      mg daily.  I want her to call in 3 weeks.  She was a little      reluctant to go on medicine.  I told her try it for the short-term      and see if she improves.   I have set followup for 1 year's time, but I am sure, I will hear from  her if symptoms do not get better.   Health care maintenance.  Last lipid panel drawn a couple of years ago,  LDL was excellent in the 60s, HDL little low at 31.  Encouraged her  exercise, get her weight down and this will need to be followed up with  fasting lipids.   Follow up as noted in 1 year.  I will be in touch with her if  she  progresses.  Again, tried to reassure her probably benign.     Pricilla Riffle, MD, [facility]  Electronically Signed    PVR/MedQ  DD: 03/27/2008  DT: 03/28/2008  Job #: [id number]

## 2011-01-24 NOTE — Assessment & Plan Note (Signed)
[facility]                               PULMONARY OFFICE NOTE   Deanna Schwartz, [facility]                    MRN:          [id number]  DATE:03/19/2006                            DOB:          July 01, 1981    HISTORY:  A 30 year old Hispanic female last seen on March 29 at [redacted] weeks  gestation with asthma and rhinitis for which she was prescribed both  Pulmicort and Rhinocort.  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   MEDICATIONS:  She is on no medications at present.  She does have albuterol,  but has not needed it because she is not short of breath.   She does complain of positional neck pain that sometimes radiates into her  right elbow, but no pleuritic chest pain or exertional chest pain.   PHYSICAL EXAMINATION:  GENERAL:  She is a pleasant black female in no acute  distress.  She does have a nasal tone to her voice.  VITAL SIGNS:  She is afebrile with normal vital signs.  HEENT:  Moderate to severe turbinate edema with nonspecific features.  There  is no cyanosis, pallor, or polyp.  Oropharynx is clear with no evidence of  excessive postnasal drainage, cobblestoning.  Ear canals clear bilaterally.  NECK:  Supple without cervical adenopathy or tenderness.  Trachea is  midline.  No thyromegaly.  LUNGS:  Lung fields clear bilaterally with excellent air movement.  CARDIAC:  Regular rhythm without murmurs, rubs, or gallops.  ABDOMEN:  Soft, benign.  EXTREMITIES:  Warm without calf tenderness, clubbing, cyanosis, edema.   Hemoglobin saturation 98% on room air.   IMPRESSION:  Chronic rhinitis, severe in nature, previously flared up during  pregnancy, but now is causing  significant nasal obstructive symptoms.  In  the past she has had asthma but I am not convinced there is enough asthma  now to warrant treatment.  I therefore spent an extra 15 to 20 minutes in 25-  minute visit going over the following issues.  1.  Treatment of rhinitis is usually chronic and requires topical steroids      in the form of Rhinocort two puffs b.i.d. with Allegra D 12-hour p.r.n.      I told her that it would most likely be that this should not be problems      with side effects in terms of nursing but she could certainly discuss      this with her pediatrician or OB/GYN.  Hopefully using the Rhinocort      consistently and using Afrin before the Rhinocort will maximize topical      delivery of the nasal steroid and minimize the amount of Rhinocort she      needs if she tapers it down to q.h.s. dosing.  2.  No evidence of active asthma.  However, did review with her the  rule of      2's in the event that she begins to need albuterol more frequently.  3.  Positional neck pain possibly with radicular features not likely to be      associated with sinus problems.  4.  If not improving on this regimen I would recommend a sinus CT scan as      the next logical step, but no further pulmonary follow-up unless      requested by internal medicine.                                   Charlaine Dalton. [name], MD, [facility]   MBW/MedQ  DD:  03/20/2006  DT:  03/20/2006  Job #:  [id number]   cc:   Sonda Primes, MD

## 2011-01-24 NOTE — Assessment & Plan Note (Signed)
[facility]                                 ON-CALL NOTE   Deanna Schwartz, Deanna Schwartz [facility]                    MRN:          [id number]  DATE:10/03/2006                            DOB:          02/25/1981    DATE/TIME:  October 03, 2006 at 5:30 p.m.   PRIMARY CARE PHYSICIAN:  Dr. Posey Rea.   TELEPHONE:  [phone]   The patient is complaining of several severe episodes in the past  several hours of shortness of breath, chest pains, difficulty breathing,  and heart racing.  She has a history of anxiety attacks and wonders if  this could be what is going on, however she has never experienced  anything this severe before.  My answer is that she should go to the  emergency room now.     Tera Mater. Clent Ridges, MD  Electronically Signed    SAF/MedQ  DD: 10/04/2006  DT: 10/04/2006  Job #: [id number]

## 2011-01-24 NOTE — H&P (Signed)
NAMEMARELLY, WEHRMAN           ACCOUNT NO.:  [id number]   MEDICAL RECORD NO.:  [id number]          PATIENT TYPE:  EMS   LOCATION:  ED                           FACILITY:  [facility]   PHYSICIAN:  Michaelyn Barter, M.D. DATE OF BIRTH:  1981/03/04   DATE OF ADMISSION:  10/14/2004  DATE OF DISCHARGE:                                HISTORY & PHYSICAL   PRIMARY CARE PHYSICIAN:  Brandon Melnick, M.D. on [facility], therefore the  patient is unassigned.   CHIEF COMPLAINT:  Asthma attack.   HISTORY OF PRESENT ILLNESS:  Ms. Gaumond is a 30 year old female with a past  medical history of asthma who states that she has been coughing for  approximately one week.  Her cough was initially nonproductive; however,  last night it became productive of yellow phlegm.  Over the course of the  past week, her chest has also began to feel more congested.  Her back in  addition has begun to ache when she takes deep breaths. She states that  yesterday evening, she began to develop shortness of breath. Her shortness  of breath progressed over the course of the night. She used her albuterol  inhaler, however, they became less productive as the course of the night  progressed to the point that she received no relief with her albuterol.  She  woke up this a.m. feeling short of breath but was able to go to work. 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  She was  concerned that her symptoms would worsen and therefore decided to come to  the ER for further evaluation. Currently she denies having any nausea,  vomiting, fevers or chills but states that her husband told her that she  felt febrile last night.   PAST MEDICAL HISTORY:  1.  Asthma diagnosed at the age of 26.  The patient visits the emergency room      approximately 1-3 times per year for evaluation of her asthma. She has      never been intubated.   She states that carpeting and rugs are      exacerbating or precipitating factors for her asthma attacks.  2.  The patient has a history of anemia.   PAST SURGICAL HISTORY:  The patient denies.   ALLERGIES:  IODINE causes the patient's throat to swell.   HOME MEDICATIONS:  Albuterol metered dose inhaler.   FAMILY HISTORY:  Mother has a history of high blood pressure.  Father no  illnesses.   SOCIAL HISTORY:  Cigarettes.  The patient denies alcohol.  The patient  drinks socially,  street drugs the patient denies.   REVIEW OF SYMPTOMS:  As per HPI.   PHYSICAL EXAMINATION:  GENERAL:  The patient speaks in full sentences. She  is not currently using any accessory muscles. This is after the patient has  received several breathing treatments and steroids in the emergency room.  VITAL SIGNS:  At the time of admission, her temperature was 98.0, heart rate  120, respirations 24,  SpO2 92%.  HEENT:  Anicteric, extraocular movements intact.  Pupils are equally  reactive to the light.  NECK:  Supple, no lymphadenopathy. The thyroid is not palpable.  CARDIAC:  S1, S2 is present.  The rate is tachycardic.  No murmurs or rubs  are appreciated.  RESPIRATORY:  The patient has loud diffuse wheezes auscultated anteriorly  and posteriorly throughout both of her lungs.  ABDOMEN:  Soft, nontender, nondistended, bowel sounds are present.  EXTREMITIES:  No leg edema.  NEUROLOGIC:  The patient is alert and oriented x3.  MUSCULOSKELETAL:  5/5 upper and lower extremity strength.   LABORATORY DATA:  There are no new labs available.  Chest x-ray is  interpreted as no acute disease.   ASSESSMENT:  1.  Acute asthma exacerbation.  This was most likely precipitated by a      respiratory tract infection that the patient has been developing over      the course of the week.  Will continue the nebulized breathing      treatments that were initiated in the emergency room with Xopenex 0.63      mg and Atrovent  0.5 mg q.4 h.  Will also provide supplemental oxygen to      the patient. In addition will continue prednisone 60 mg, 1 tablet p.o.      q. 8 hours.  2.  Respirator tract infection. This may be bronchitis.  Will start empiric      moxifloxacin 400 mg IV piggyback q. 24 hours.  Will also provide      Robitussin 10 mL p.o. q.6 hours and will provide incentive spirometer to      the patient.  3.  GI prophylaxis.  Will provide Protonix 40 mg, 1 tablet p.o. q.d.      OR/MEDQ  D:  10/14/2004  T:  10/14/2004  Job:  [id number]

## 2011-01-24 NOTE — Assessment & Plan Note (Signed)
[facility]                         GASTROENTEROLOGY OFFICE NOTE   Daquisha, [facility]                    MRN:          [id number]  DATE:01/06/2007                            DOB:          [birth year]    CHIEF COMPLAINT:  Abdominal pain.   Deanna Schwartz walked into my office today to tell me that she was having a  lot of abdominal pain today.  I had seen her back in February because of  abdominal pain and reflux.  See that note of November 04, 2006 for  further details.  She had done reasonably well on the Aciphex with her  heartburn problems, but over the last 12 to 18 hours, she has had  problems with abdominal discomfort in the upper abdomen.  It seemed to  intensify after she ate, starting last night.  She had a quesadilla at a  [employer] at lunchtime today, and it made things much worse.  She was mild to moderately tender in the right upper quadrant without  rebound.   ASSESSMENT:  Recurrent abdominal pain.  This pain sounds more like  possibly gallbladder problems.  Ultrasound was unrevealing in January.  Gallbladder dyskinesia is certainly possible versus chronic  cholecystitis.  Ulcer disease is possible, but seems unlikely on chronic  Aciphex.  It does not sound like reflux.   PLAN:  Zegerid samples are given to the patient to start.  If she is  still symptomatic tomorrow, we will order labs and a HIDA scan.     Iva Boop, MD,FACG  Electronically Signed    CEG/MedQ  DD: 01/06/2007  DT: 01/06/2007  Job #: [id number]

## 2011-01-24 NOTE — Assessment & Plan Note (Signed)
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  Shelle Iron and I have felt has upper airway features consistent with either  reflux or sinusitis.  She comes in with a an exacerbation now, seeing  Dr. Shelle Iron on December 20, with serial wheeze on exam, and was found to  have sinusitis by CT scan dated December 21.  My previous instructions  for her were to maintain nasal steroids, and this was expressed in the  form of a chronic rhinitis flyer which she received in February, and  then again in July of 2007, but failed to follow the instructions.  On  that flyer, I went over  graphic and text formatted material regarding  the nature of treatment of chronic rhinitis, emphasizing that the  treatment was both chronic and topical, inflammatory, and the best way  to treat it was to make sure that nasal steroids were applied regularly  with p.r.n. Afrin to help with obstructive symptoms if present.  It is  not clear that she followed any of these instructions, but it was partly  because she did not like the effects of nasal steroids with irritation  of her nose.   It turns out she stopped the nasal steroids about a month ago, and then  began having more symptoms.  She says in the past she has used Singulair  for some of her symptoms, and it has seemed to work better.   Presently, she comes in complaining of a hacking cough productive of  yellow sputum, especially when she wakes up in the morning with  subjective wheezing and dyspnea, and also 1 episode of discomfort in her  right chest for 3 hours yesterday during a coughing fit.  She denies any  further pain today,  fevers, chills, sweats, sore throat, or overt  reflux.   PHYSICAL EXAMINATION:  She is a pleasant, ambulatory, Hispanic female in  no acute distress with stable vital signs.  HEENT:  Remarkable for severe turbinate edema bilaterally.  Oropharynx  is clear with postnasal drip.  NECK:  Supple without cervical adenopathy tenderness.  Trachea is  midline.  LUNGS:  Clear bilaterally to auscultation and percussion.  HEART: Regular rhythm, no murmur.  ABDOMEN:  Soft, benign.  EXTREMITIES:  Warm without calf tenderness, cyanosis, clubbing, or  edema.   IMPRESSION:  No evidence of active asthma, and this patient is having  acute rhinitis with evidence of sinusitis by CT scan as recently as  December 21.  This is more evidence yet that true asthma is not likely  present.  1. 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  I am going to recommend      Avelox 400 mg for 5 days to be followed up by 5 more days if not      completely better, and perhaps after that an ENT evaluation needs      to be considered.   In terms of the chronic management of rhinitis, again, I have reviewed  with her the options, including adding Singulair, which has worked  previously, but we would recommend 6 days of prednisone just to gain  acute control of the inflammation, and then ask the question whether  Singulair dosed as monotherapy will be enough to control the nasal  inflammation.  If not, the option is to restart a different nasal  steroid or have her seen by Fannie Knee, allergist.   She only received 4 weeks of Singulair at which time she needs to be  reevaluated either by Dr. Posey Rea or in this clinic, for response to  therapy.  We  certainly will see her sooner if her condition worsens in  any way.     Charlaine Dalton. [name], MD, [facility]  Electronically Signed    MBW/MedQ  DD: 08/31/2006  DT: 08/31/2006  Job #: [id number]   cc:   Georgina Quint. Plotnikov, MD

## 2011-01-24 NOTE — Assessment & Plan Note (Signed)
[facility]                                 ON-CALL NOTE   Deanna Schwartz, Deanna Schwartz [facility]                    MRN:          [id number]  DATE:09/21/2006                            DOB:          [birth year]    PHONE NUMBER:  [phone].   Patient of Dr. Posey Rea.   She called at 6:13 p.m. on January 14. She complains of bad pain in her  left leg that started just today at 5:30 like 45 minutes ago. 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 She is healthy on no medications,  has had no problems until 5:30 today. She calls for advice.   PLAN:  Told her it is hard to say after just 45 minutes with pain and  that the only serious consideration that comes to mind immediately would  be a blood clot but that would be unusual without any swelling as she  described. I told her that she should elevate it, put some heat and  maybe try and antiinflammatory like ibuprofen or naproxen. If things get  worse tonight, she should seek emergency room treatment. Otherwise if  she is not better tomorrow she should probably make an appointment for  Dr. Posey Rea to check it in the morning or sometime tomorrow.     Karie Schwalbe, MD  Electronically Signed    RIL/MedQ  DD: 09/21/2006  DT: 09/22/2006  Job #: [id number]   cc:   Georgina Quint. Plotnikov, MD

## 2011-01-24 NOTE — Discharge Summary (Signed)
[name], Deanna Schwartz           ACCOUNT NO.:  [id number]   MEDICAL RECORD NO.:  [id number]          PATIENT TYPE:  INP   LOCATION:  9103                          FACILITY:  [facility]   PHYSICIAN:  Malachi Pro. Ambrose Mantle, M.D. DATE OF BIRTH:  1981/06/18   DATE OF ADMISSION:  02/01/2006  DATE OF DISCHARGE:                                 DISCHARGE SUMMARY   HISTORY OF PRESENT ILLNESS:  [age]-year-old female, para 0, gravida  1, EDC Jan 22, 2006, admitted in early labor.  Blood group and type B  positive, negative antibody, sickle cell negative, Toxo negative, RPR  nonreactive, rubella immune, hepatitis B surface antigen negative, HIV  negative per patient, triple screen normal, 1-hour Glucola 107, group B  strep positive.  Ultrasound, August 22, 2005 had estimated gestational age  [redacted] weeks, [facility] Jan 23, 2006.  The patient was treated at 13 weeks for  asthmatic bronchitis with albuterol and Zithromax, also was given Zofran for  nausea and vomiting, also was given Rhinocort for allergic rhinitis.  The  patient transferred to our practice at 29 weeks.  She required far more than  normal number of visits.  She has had reactive nonstress test since 40  weeks.  Amniotic fluid index has been normal on Jan 29, 2006.  Cervix was 1  cm, 50%.  She came for labor check on Jan 31, 2006 and again today.  At the  time of admission, the cervix had changed from 3 to 4 cm, 90%, vertex and -  2.   MEDICAL HISTORY:   ALLERGIES:  IODINE.   ILLNESSES:  1.  Asthma.  2.  Cystitis.   OPERATIONS:  None.   FAMILY HISTORY:  Mother with high blood pressure.   PHYSICAL EXAM:  VITAL SIGNS:  Normal.  HEART:  Normal size and sounds.  No murmurs.  LUNGS:  Clear to auscultation.  ABDOMEN:  Soft, fundal height 38 cm.  Fetal heart tones normal.  PELVIC:  Cervix was 3-4 cm, 90% vertex at a -2.   HOSPITAL COURSE:  The patient was started on penicillin at 11:05 a.m.  Contractions were every 5-10 minutes.  The  cervix was 3-4 cm, 90%.  Artificial rupture of membranes was attempted; no fluid was seen.  The  patient received an epidural.  By 7:05 p.m., contractions were every 2  minutes.  Considerable bloody show was present.  The cervix was 8 cm at 5:30  and at 6:30 p.m.  On my exam at 7:05, the cervix was 9 cm, vertex at a 0  station, OA position.  The patient developed a fever to 101.1.  Cervix was  fully dilated, vertex at a +2.  Antibiotics were withheld except for the  penicillin.  She pushed well and delivered spontaneously OA over a second-  degree midline laceration a living female infant, 7 pounds 0 ounces, Apgar  scores of 9 at 1 and 9 at 5 minutes.  There was meconium-stained fluid and  the nose and pharynx were suctioned with the DeLee and the bulb prior to  delivery of the body.  Placenta was intact.  Uterus normal,  rectal negative,  second-degree midline laceration repaired with 2-0 and 3-0 Vicryl, blood  loss about 400 mL.  Dr. Ambrose Mantle was in attendance.  Postpartum, the patient  did well and was discharged on the second postpartum day.  Initial  hemoglobin 12.3, hematocrit 36,9, white count 10,700, platelet count  221,000.  Followup hemoglobin 10.9.  RPR nonreactive.   FINAL DIAGNOSIS:  Intrauterine pregnancy at 29+ weeks, delivered  occipitoanterior.   OPERATION:  1.  Spontaneous delivery, occipitoanterior.  2.  Repair of midline laceration.   FINAL CONDITION:  Improved.   DISCHARGE INSTRUCTIONS:  Instructions include our regular discharge  instruction booklet.   FOLLOWUP:  The patient is advised to return to the office in 6 weeks for  followup examination.   DISCHARGE MEDICATIONS:  Percocet 5/325 mg, 24 tablets, one every 4-6 hours  as needed for pain was given at discharge.      Malachi Pro. Ambrose Mantle, M.D.  Electronically Signed     TFH/MEDQ  D:  02/03/2006  T:  02/03/2006  Job:  [id number]

## 2011-01-24 NOTE — Assessment & Plan Note (Signed)
[facility]                              CARDIOLOGY OFFICE NOTE   Prerna, [facility]                    MRN:          [id number]  DATE:04/24/2006                            DOB:          October 25, 1980    IDENTIFICATION:  Ms. Deanna Schwartz is a 30 year old woman who was referred for  evaluation of palpitations.   HISTORY OF PRESENT ILLNESS:  The patient has no known cardiac history.  She  is now two and a half months postpartum and began having palpitations  actually right after she delivered.  She said she has never had them before.  They sound like isolated skips when she describes them.  She cut back on her  caffeine intake but is still having them.  There have been a couple of  nights where she has not been able to sleep because of it.  Notes only a  couple of spells where she got a little warm feeling and slightly dizzy but  no syncope.   Also complains of some headaches, frontal and posterior.  These, again, are  new.  She does have a history of sinus problems but has never had any until  now.   She notes she is drinking a few glasses of water per day.  Her water is a  little yellow.  Not as clear as it had been in the past.   Otherwise, breathing has been okay.  No chest pain.   ALLERGIES:  IODINE.   PAST MEDICAL HISTORY:  1. Seasonal allergies.  2. Asthma.   CURRENT MEDICATIONS:  None.   FAMILY HISTORY:  No history of coronary artery disease.   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  She does not smoke.  She  drinks occasionally.   REVIEW OF SYSTEMS:  History of seasonal allergies.  History of sinus  problems.  History of asthma.  History of anemia.  Otherwise, all systems  reviewed and negative to the above problem.   PHYSICAL EXAMINATION:  GENERAL:  The patient is in no distress.  VITAL SIGNS:  Blood pressure is 121/64, pulse 81 and regular, weight 198.  LUNGS:  Clear.  NECK:  JVP is normal.  Thyroid is slightly  prominent.  Supple.  CARDIAC:  Regular rate and rhythm.  S1 and S2.  No S3, S4, murmurs, or  skips.  ABDOMEN:  Benign.  EXTREMITIES:  Good distal pulses.  No edema.   DIAGNOSTIC STUDIES:  12-lead EKG reveals normal sinus rhythm, 81 beats per  minute.   LABORATORY DATA:  Labs recently drawn show a TSH which is normal.  Hemoglobin normal.  Potassium 4.6.   IMPRESSION:  Ms. Viswanathan is a 30 year old woman with palpitations.  They  sound like either isolated premature atrial contractions or premature  ventricular contractions.  Again, she is postpartum.  Cardiac exam is  normal.  I do not think there is any evidence of cardiac enlargement  clinically.   RECOMMENDATIONS:  I would recommend following for now.  I have encouraged  her to increase her fluid salt intake.  It sounds like she may  be letting  herself get a little dry at times which may exacerbate.   If she is still having problems in a couple of months or if things get  worse, I would set her up with an event monitor to document.   In regards to her headache, this may indeed be related to sinus issues.  Would, again, follow and treat as needed.                                Pricilla Riffle, MD, [facility]    PVR/MedQ  DD:  04/24/2006  DT:  04/24/2006  Job #:  [id number]   cc:   Sonda Primes, MD  Malachi Pro. Ambrose Mantle, MD

## 2011-01-24 NOTE — Discharge Summary (Signed)
Deanna Schwartz, Deanna Schwartz           ACCOUNT NO.:  [id number]   MEDICAL RECORD NO.:  [id number]          PATIENT TYPE:  INP   LOCATION:  0484                         FACILITY:  [facility]   PHYSICIAN:  Lonia Blood, M.D.      DATE OF BIRTH:  16-Jan-1981   DATE OF ADMISSION:  10/14/2004  DATE OF DISCHARGE:  10/16/2004                                 DISCHARGE SUMMARY   PRIMARY CARE PHYSICIAN:  Patient is currently unassigned.   DISCHARGE DIAGNOSES:  1.  Acute asthma attack.  2.  Normocytic anemia.  3.  Possible bronchitis.   DISCHARGE MEDICATIONS:  1.  Advair Diskus 150 two puffs b.i.d.  2.  Prednisone taper as instructed.  3.  Albuterol MDI one puff t.i.d.  4.  Patient also given over-the-counter Robitussin 10 mL q.6h. as needed for      cough.   DISPOSITION:  Patient has been following up at  [facility].  She does need a primary care physician and works with [facility] and patient says she will get one of them to be her regular  primary care doctor.   CONSULTS:  None.   PROCEDURES PERFORMED:  Chest x-ray performed on October 14, 2004 that shows  no acute disease.   BRIEF HISTORY AND PHYSICAL:  This is a 30 year old Hispanic female with  history of asthma who came in with acute asthma attack on October 14, 2004.  Patient was seen initially in the ER, discharged home.  However, she came  back with worsening symptoms.  Patient was found in the ED to have severe  respiratory problems, but not very hypoxic.  She was wheezing continuously  with some use accessory muscles of respiration.  She was subsequently  admitted for management of acute asthma exacerbation.   HOSPITAL COURSE:  #1 - ACUTE ASTHMA EXACERBATION:  Patient was initiated on  IV Solu-Medrol plus nebulizers, initially Xopenex and Atrovent nebulizers.  She was also given some supplemental oxygen.  Patient seemed to have  responded very well to the treatment as her wheezing and respiratory  distress improved.  At the time of discharge, however, she still has some  mild wheezing, but able to move around without any problems.  She is to  continue on her prednisone taper in addition to her albuterol inhaler.  Patient is also given Advair Diskus for long-term therapy of her asthma to  prevent further relapses.   #2 - POSSIBLE BRONCHITIS:  Patient was initially thought to have possible  bronchitis based on her symptoms.  She was started on some oral antibiotics,  Avelox to be exact which has been discontinued since patient had no fever or  any signs of infection.   #3 - NORMOCYTIC ANEMIA:  Patient has chronic normocytic anemia which is mild  with a hemoglobin 11.4.  This could just be especially due to her age being  in the prime age group of having menstrual cycles.  No further workup is  performed and patient referred to her primary care physician if needed for  further workup.      LG/MEDQ  D:  10/16/2004  T:  10/16/2004  Job:  [id number]

## 2011-01-24 NOTE — Assessment & Plan Note (Signed)
[facility]                             PRIMARY CARE OFFICE NOTE   Theia, Dezeeuw [facility]                    MRN:          [id number]  DATE:05/08/2006                            DOB:          17-Jun-1981    The patient is a 30 year old female who presents for a wellness examination.   PAST MEDICAL HISTORY:  1. Asthma.  2. Rhinitis.   FAMILY HISTORY:  Negative for coronary disease.   SOCIAL HISTORY:  She is single with 1 daughter who is 70 months old.   CURRENT MEDICATIONS:  1. Zyrtec.  2. Pulmicort.  3. Albuterol p.r.n.   ALLERGIES:  Iodine.   REVIEW OF SYSTEMS:  Sinus congestion with a green discharge, anxious with  episodes what sound panic attacks, occasional headaches, allergy symptoms,  occasional palpitations.  Regular GYN care by Dr. Ambrose Mantle.  Has been feeling  stopped up all the time.  Headaches every other day.  The rest is negative.   PHYSICAL:  VITAL SIGNS:  Blood pressure 122/73.  Temp 99.4.  Pulse 90.  Weight 200 pounds.  GENERAL:  She was in no acute distress, a little anxious.  HEENT:  __________ nasal mucosa.  NECK:  Supple.  No thyromegaly or bruit.  LUNGS:  Clear.  No wheezes or rales.  HEART:  S1, S2.  No murmur.  No gallop.  ABDOMEN:  Soft, nontender.  No organomegaly.  No masses felt.  EXTREMITIES:  Lower extremities without edema.  NEUROLOGIC:  She is alert, oriented, and cooperative.  Denies being  depressed.   Labs on April 06, 2006, and May 07, 2006, reviewed.   ASSESSMENT AND PLAN:  1. Normal wellness examination.  Age/health related issues discussed.      Healthy lifestyle discussed.  Calcium and vitamin D.  She is breast-      feeding.  Regular GYN care with Dr. Ambrose Mantle.  2. Sinus tachycardia, likely due to anxiety/panic attacks.  We will      monitor.  She will let me know if there are problems.  3. Probable sinusitis.  __________ 500 p.o. b.i.d. for 10 days; Entex LA,      1 twice a day p.r.n.  4.  Anxiety/panic attacks.  Ativan 1 mg p.o. b.i.d. p.r.n., #60 with 3      refills.  5. Headaches, likely migraine.  She will use Tylenol p.r.n.  I will see      her back in 3 months.                                   Georgina Quint. Plotnikov, MD   AVP/MedQ  DD:  05/14/2006  DT:  05/14/2006  Job #:  [id number]   cc:   Malachi Pro. Ambrose Mantle, M.D.

## 2011-01-24 NOTE — Assessment & Plan Note (Signed)
[facility]                         GASTROENTEROLOGY OFFICE NOTE   Deanna Schwartz, Deanna Schwartz [facility]                    MRN:          [id number]  DATE:11/04/2006                            DOB:          05/30/1981    CHIEF COMPLAINT:  Stomach problems and reflux.   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 She has had problems with heartburn and reflux. Recently, had H-  pylori testing and was treated for positive serology. She was given a  week of Levaquin, which may have been for another process and she was  also given Aciphex b.i.d. for 10 days, amoxicillin 500 mg 2 tablets  twice daily for 10 days, and Biaxin 500 mg twice daily for 10 days. She  got through about 5 days of that because she turned out to be pregnant  but then had a spontaneous miscarriage. She had normal LFT's. This was  on January 30th. She had a negative UA and negative culture. She has  been waking up at night with heartburn problems and some chest  discomfort. She also has chronic constipation problems. She was  prescribed GlycoLax but she did not take that yet. She was also given  some Xanax prn to be used for anxiety and panic issues, which were  suspected.   The constipation problems have been chronic. She has little hard balls  of stool that come out at times. When she strains, which happens at  times, she has seen some bright red blood. She has tried some fiber  supplements but not long term. She has minimized caffeine. She does not  eat late at night. She does think that the Aciphex helps her reflux,  even the nocturnal problems, when she takes it in the morning. Abdominal  ultrasound has been normal. She does get some left lower quadrant pain  that is relieved when she defecates. There has been some appetite change  and some weight loss.   PAST MEDICAL HISTORY:  Asthma, anxiety, allergies.   FAMILY HISTORY:  Diabetes in a grandmother.  No colon cancer or GI  problems.   SOCIAL HISTORY:  She has 1 daughter. She lives with her husband and  mother-in-law. No significant alcohol. No tobacco or drugs. She does  indicate that she is somewhat anxious, allergy problem, she has some  menstrual cramps at times with her menses.   PHYSICAL EXAMINATION:  VITAL SIGNS:  Height 5 foot 3. Weight 187 pounds.  Blood pressure 118/70, pulse 68.  HEENT:  Anicteric. Mouth no lesions.  NECK:  Supple.  CHEST:  Clear.  HEART:  S1 and S2. No murmur, rub, or gallop.  ABDOMEN:  She is mildly tender in the epigastrium. Soft, nontender, and  otherwise without organomegaly or mass.  RECTAL:  Examination shows what looks to be an old hemorrhoid in the  canal. This is performed in the presence of female nursing staff. There  is brown stool, no mass.  EXTREMITIES:  Free of edema.  SKIN:  Warm and dry.  PSYCHIATRIC/NEUROLOGIC:  She appears alert and oriented x3.   Note, her asthma  has been under good control. She has been on Singulair  for that. She is taking Aciphex daily at this time, though she has held  it recently. Her thyroid functions have been normal.   ASSESSMENT:  1. Symptoms compatible with gastroesophageal reflux disease.  2. Positive H-pylori serology. That typically is not related to      reflux, though could have caused some dyspepsia. She is partially      treated.  3. Chronic constipation with some anorectal bleeding, irritable bowel      syndrome problems.   PLAN:  1. Try fiber supplementation. If that is not helpful, try the MiraLax.  2. Go back on Aciphex 20 mg daily. She can take that before supper      time. That might help her more at night, though it did seem to help      when she took it in the morning.  3. Let me know if this is not working and we can try other treatments.      I do not think that she needs any endoscopy procedures at this      time. She is reassured.  4. I did not talk to her about her anxiety  issues much. That could be      playing some role in the way that she is feeling.     Iva Boop, MD,FACG  Electronically Signed    CEG/MedQ  DD: 11/04/2006  DT: 11/04/2006  Job #: [id number]

## 2011-02-10 ENCOUNTER — Encounter: Payer: Self-pay | Admitting: Internal Medicine

## 2011-02-19 ENCOUNTER — Other Ambulatory Visit: Payer: Self-pay

## 2011-02-19 MED ORDER — POLYETHYLENE GLYCOL 3350 17 GM/SCOOP PO POWD: 17.0000 g | Freq: Every day | ORAL | Status: DC

## 2011-02-19 NOTE — Telephone Encounter (Signed)
Needs a refill on her meds

## 2011-02-27 ENCOUNTER — Ambulatory Visit (INDEPENDENT_AMBULATORY_CARE_PROVIDER_SITE_OTHER): Payer: Commercial Managed Care - PPO | Admitting: Internal Medicine

## 2011-02-27 ENCOUNTER — Encounter: Payer: Self-pay | Admitting: Internal Medicine

## 2011-02-27 DIAGNOSIS — F1120 Opioid dependence, uncomplicated: Secondary | ICD-10-CM | POA: Insufficient documentation

## 2011-02-27 DIAGNOSIS — F1920 Other psychoactive substance dependence, uncomplicated: Secondary | ICD-10-CM

## 2011-02-27 DIAGNOSIS — R002 Palpitations: Secondary | ICD-10-CM

## 2011-02-27 NOTE — Assessment & Plan Note (Signed)
Patient's palpitations are very short lived.  They do not sound hemodynamically signif.  I reassured her.  No further Rx.

## 2011-02-27 NOTE — Assessment & Plan Note (Signed)
Patient came in also asking for pain meds.  Said her doctor did not Rx. I declined.  Told her she needed to address with primary MD.

## 2011-02-27 NOTE — Progress Notes (Signed)
HPI Patient is a 30 year old who I saw in the past for palpitations  She returns for reeval. She says she has occasional episodes where she feels her heart fluttering.  Lasts seconds.  NO dizziness.  Feels like a twinkling in chest. Denies chest pains.  No SOB>  Allergies  Allergen Reactions  . Cephalexin     REACTION: nausea  . Iodine     Current Outpatient Prescriptions  Medication Sig Dispense Refill  . calcium carbonate 200 MG capsule Take 250 mg by mouth daily.        . cholecalciferol (VITAMIN D) 1000 UNITS tablet Take 1,000 Units by mouth daily.        [name] dexlansoprazole (DEXILANT) 60 MG capsule Take 60 mg by mouth daily. As needed      . ibuprofen (ADVIL,MOTRIN) 200 MG tablet Take 200 mg by mouth. 1-2 as needed for pain       . polyethylene glycol powder (MIRALAX) powder Take 17 g by mouth daily.  527 g  6  . promethazine (PHENERGAN) 25 MG tablet Take 25 mg by mouth as needed. Nausea and vomiting       . vitamin C (ASCORBIC ACID) 500 MG tablet Take 500 mg by mouth daily.        [name] azithromycin (ZITHROMAX) 250 MG tablet Take 2 tablets by mouth on day 1, followed by 1 tablet by mouth daily for 4 days.        . budesonide-formoterol (SYMBICORT) 160-4.5 MCG/ACT inhaler Inhale 2 puffs into the lungs 2 (two) times daily.        . Prenatal Vit-Fe Fumarate-FA (PRENATABS FA) TABS Take by mouth daily.          Past Medical History  Diagnosis Date  . Asthma   . Anxiety     and panic attacks  . GERD (gastroesophageal reflux disease)   . Allergic rhinitis   . Migraine   . Chronic constipation   . Anemia     No past surgical history on file.  Family History  Problem Relation Age of Onset  . Hypertension Other   . Diabetes Other     grandmother     History   Social History  . Marital Status: Married    Spouse Name: N/A    Number of Children: N/A  . Years of Education: N/A   Occupational History  . Not on file.   Social History Main Topics  . Smoking status: Never  Smoker   . Smokeless tobacco: Not on file  . Alcohol Use: No  . Drug Use:   . Sexually Active:    Other Topics Concern  . Not on file   Social History Narrative   Married in October 2009, 3 children.     Review of Systems:  All systems reviewed.  They are negative to the above problem except as previously stated.  Vital Signs: BP 112/73  Pulse 87  Ht 5\' 3"  (1.6 m)  Wt 196 lb (88.905 kg)  BMI 34.72 kg/m2  Physical Exam  Patient is in NAD HEENT:  Normocephalic, atraumatic. EOMI, PERRLA.  Neck: JVP is normal. No thyromegaly. No bruits.  Lungs: clear to auscultation. No rales no wheezes.  Heart: Regular rate and rhythm. Normal S1, S2. No S3.   No significant murmurs. PMI not displaced.  Abdomen:  Supple, nontender. Normal bowel sounds. No masses. No hepatomegaly.  Extremities:   Good distal pulses throughout. No lower extremity edema.  Musculoskeletal :moving all  extremities.  Neuro:   alert and oriented x3.  CN II-XII grossly intact.  EKG:  NSR.  86 bpm  Assessment and Plan:

## 2011-03-18 ENCOUNTER — Encounter: Payer: Self-pay | Admitting: Internal Medicine

## 2011-03-20 ENCOUNTER — Encounter: Payer: Self-pay | Admitting: Internal Medicine

## 2011-03-24 ENCOUNTER — Encounter: Payer: Self-pay | Admitting: Internal Medicine

## 2011-03-24 ENCOUNTER — Ambulatory Visit (INDEPENDENT_AMBULATORY_CARE_PROVIDER_SITE_OTHER): Payer: Commercial Managed Care - PPO | Admitting: Internal Medicine

## 2011-03-24 ENCOUNTER — Other Ambulatory Visit (INDEPENDENT_AMBULATORY_CARE_PROVIDER_SITE_OTHER): Payer: Commercial Managed Care - PPO

## 2011-03-24 ENCOUNTER — Ambulatory Visit: Payer: Commercial Managed Care - PPO | Admitting: Internal Medicine

## 2011-03-24 DIAGNOSIS — M2550 Pain in unspecified joint: Secondary | ICD-10-CM

## 2011-03-24 DIAGNOSIS — E538 Deficiency of other specified B group vitamins: Secondary | ICD-10-CM

## 2011-03-24 DIAGNOSIS — B029 Zoster without complications: Secondary | ICD-10-CM

## 2011-03-24 DIAGNOSIS — F411 Generalized anxiety disorder: Secondary | ICD-10-CM

## 2011-03-24 DIAGNOSIS — R209 Unspecified disturbances of skin sensation: Secondary | ICD-10-CM

## 2011-03-24 DIAGNOSIS — R202 Paresthesia of skin: Secondary | ICD-10-CM | POA: Insufficient documentation

## 2011-03-24 LAB — COMPREHENSIVE METABOLIC PANEL
ALT: 18 U/L (ref 0–35)
AST: 20 U/L (ref 0–37)
Alkaline Phosphatase: 62 U/L (ref 39–117)
Sodium: 134 mEq/L — ABNORMAL LOW (ref 135–145)
Total Bilirubin: 0.4 mg/dL (ref 0.3–1.2)
Total Protein: 8 g/dL (ref 6.0–8.3)

## 2011-03-24 LAB — CBC WITH DIFFERENTIAL/PLATELET
Eosinophils Relative: 6.7 % — ABNORMAL HIGH (ref 0.0–5.0)
HCT: 37.3 % (ref 36.0–46.0)
Lymphs Abs: 2 10*3/uL (ref 0.7–4.0)
MCV: 84.5 fl (ref 78.0–100.0)
Monocytes Absolute: 0.6 10*3/uL (ref 0.1–1.0)
Platelets: 245 10*3/uL (ref 150.0–400.0)
RDW: 13.6 % (ref 11.5–14.6)
WBC: 9.8 10*3/uL (ref 4.5–10.5)

## 2011-03-24 LAB — TSH: TSH: 1.29 u[IU]/mL (ref 0.35–5.50)

## 2011-03-24 LAB — VITAMIN D 25 HYDROXY (VIT D DEFICIENCY, FRACTURES): Vit D, 25-Hydroxy: 33 ng/mL (ref 30–89)

## 2011-03-24 LAB — SEDIMENTATION RATE: Sed Rate: 29 mm/hr — ABNORMAL HIGH (ref 0–22)

## 2011-03-24 MED ORDER — ACYCLOVIR 800 MG PO TABS: 800.0000 mg | ORAL_TABLET | Freq: Every day | ORAL | Status: AC

## 2011-03-24 MED ORDER — ALPRAZOLAM 0.25 MG PO TABS: 0.2500 mg | ORAL_TABLET | Freq: Two times a day (BID) | ORAL | Status: AC

## 2011-03-24 MED ORDER — TRIAMCINOLONE ACETONIDE 0.5 % EX CREA: TOPICAL_CREAM | Freq: Three times a day (TID) | CUTANEOUS | Status: DC

## 2011-03-24 MED ORDER — ACYCLOVIR 800 MG PO TABS: 800.0000 mg | ORAL_TABLET | Freq: Every day | ORAL | Status: DC

## 2011-03-24 MED ORDER — MELOXICAM 15 MG PO TABS: 15.0000 mg | ORAL_TABLET | Freq: Every day | ORAL | Status: DC | PRN

## 2011-03-24 NOTE — Assessment & Plan Note (Signed)
Acyclovir 

## 2011-03-24 NOTE — Assessment & Plan Note (Signed)
Alpraz prn

## 2011-03-24 NOTE — Progress Notes (Signed)
  Subjective:    Patient ID: Deanna Schwartz, female    DOB: 03/14/1981, 30 y.o.   MRN: [id number]  HPI C/o severe pain in joints - knees and elbow, neck, fingers, hands. No stiffness. Pain was 10/10 in elbows L>R. It started 6 wks ago  Review of Systems  Constitutional: Positive for unexpected weight change (wt gain). Negative for chills, activity change, appetite change and fatigue.  HENT: Negative for congestion, mouth sores and sinus pressure.   Eyes: Negative for visual disturbance.  Respiratory: Negative for cough and chest tightness.   Gastrointestinal: Negative for nausea and abdominal pain.  Genitourinary: Negative for frequency, difficulty urinating and vaginal pain.  Musculoskeletal: Positive for myalgias, back pain and arthralgias. Negative for joint swelling and gait problem.  Skin: Negative for color change, pallor and rash.  Neurological: Positive for weakness. Negative for dizziness, tremors, numbness and headaches.  Psychiatric/Behavioral: Negative for suicidal ideas, confusion and sleep disturbance. The patient is nervous/anxious.    Wt Readings from Last 3 Encounters:  03/24/11 201 lb (91.173 kg)  02/27/11 196 lb (88.905 kg)  10/11/10 193 lb (87.544 kg)       Objective:   Physical Exam  Constitutional: She appears well-developed. No distress.       Obese  HENT:  Head: Normocephalic.  Right Ear: External ear normal.  Left Ear: External ear normal.  Nose: Nose normal.  Mouth/Throat: Oropharynx is clear and moist.  Eyes: Conjunctivae are normal. Pupils are equal, round, and reactive to light. Right eye exhibits no discharge. Left eye exhibits no discharge.  Neck: Normal range of motion. Neck supple. No JVD present. No tracheal deviation present. No thyromegaly present.  Cardiovascular: Normal rate, regular rhythm and normal heart sounds.   Pulmonary/Chest: No stridor. No respiratory distress. She has no wheezes.  Abdominal: Soft. Bowel sounds are normal. She  exhibits no distension and no mass. There is no tenderness. There is no rebound and no guarding.  Musculoskeletal: She exhibits tenderness (Diffusely tender over UEs and LEs, LS, neck). She exhibits no edema.  Lymphadenopathy:    She has no cervical adenopathy.  Neurological: She displays normal reflexes. No cranial nerve deficit. She exhibits normal muscle tone. Coordination normal.  Skin: No rash noted. No erythema.  Psychiatric: She has a normal mood and affect. Her behavior is normal. Judgment and thought content normal.      Rash - L dist forearm 4 cm eryth patch w/blisters   Lab Results  Component Value Date   WBC 9.8 03/24/2011   HGB 12.6 03/24/2011   HCT 37.3 03/24/2011   PLT 245.0 03/24/2011   CHOL 147 10/04/2010   TRIG 50.0 10/04/2010   HDL 53.50 10/04/2010   ALT 18 03/24/2011   AST 20 03/24/2011   NA 134* 03/24/2011   K 4.0 03/24/2011   CL 101 03/24/2011   CREATININE 0.9 03/24/2011   BUN 13 03/24/2011   CO2 24 03/24/2011   TSH 1.29 03/24/2011     Assessment & Plan:    A complex case

## 2011-03-24 NOTE — Assessment & Plan Note (Signed)
Get labs

## 2011-03-25 ENCOUNTER — Ambulatory Visit: Payer: Commercial Managed Care - PPO | Admitting: Internal Medicine

## 2011-03-25 ENCOUNTER — Encounter: Payer: Self-pay | Admitting: Internal Medicine

## 2011-03-25 ENCOUNTER — Telehealth: Payer: Self-pay | Admitting: Internal Medicine

## 2011-03-25 DIAGNOSIS — E538 Deficiency of other specified B group vitamins: Secondary | ICD-10-CM

## 2011-03-25 HISTORY — DX: Deficiency of other specified B group vitamins: E53.8

## 2011-03-25 MED ORDER — VITAMIN B-12 1000 MCG SL SUBL: 1.0000 | SUBLINGUAL_TABLET | Freq: Every day | SUBLINGUAL | Status: DC

## 2011-03-25 MED ORDER — VITAMIN D 1000 UNITS PO TABS: 1000.0000 [IU] | ORAL_TABLET | Freq: Two times a day (BID) | ORAL | Status: DC

## 2011-03-25 NOTE — Assessment & Plan Note (Signed)
Start Rx 

## 2011-03-25 NOTE — Telephone Encounter (Signed)
Deanna Schwartz, please, inform patient that all labs are normal except for low Vit B12 - vit b12 def- Start SL B12. Double up vit D dose. Come for a B12 shot. Check Vit B12 in 6 wks Thx

## 2011-03-27 ENCOUNTER — Ambulatory Visit (INDEPENDENT_AMBULATORY_CARE_PROVIDER_SITE_OTHER): Payer: Commercial Managed Care - PPO

## 2011-03-27 DIAGNOSIS — E538 Deficiency of other specified B group vitamins: Secondary | ICD-10-CM

## 2011-03-27 MED ORDER — CYANOCOBALAMIN 1000 MCG/ML IJ SOLN
1000.0000 ug | Freq: Once | INTRAMUSCULAR | Status: AC
  Administered 2011-03-27: 1000 ug via INTRAMUSCULAR

## 2011-03-27 NOTE — Telephone Encounter (Signed)
Pt informed/ transferred to scheduler to make nurse visit. Order entered for b12 in 6 wks.

## 2011-04-18 ENCOUNTER — Telehealth: Payer: Self-pay | Admitting: Internal Medicine

## 2011-04-18 ENCOUNTER — Other Ambulatory Visit (INDEPENDENT_AMBULATORY_CARE_PROVIDER_SITE_OTHER): Payer: Commercial Managed Care - PPO

## 2011-04-18 DIAGNOSIS — E538 Deficiency of other specified B group vitamins: Secondary | ICD-10-CM

## 2011-04-18 NOTE — Telephone Encounter (Signed)
B12 level is 208 and quite fatigued On sublingual B12 and received 1 injection in primary care  Also low normal vit D level, supplements recommended  Still has constipation problems despite MiraLax though it helps  Will have her get TTG Ab and IgA level use B12 deficiency as dx to associate

## 2011-04-18 NOTE — Telephone Encounter (Signed)
Pt aware.

## 2011-04-21 ENCOUNTER — Telehealth: Payer: Self-pay | Admitting: Internal Medicine

## 2011-04-21 LAB — GLIADIN ANTIBODIES, SERUM: Gliadin IgA: 5.1 U/mL (ref <20)

## 2011-04-21 NOTE — Telephone Encounter (Signed)
Deanna Schwartz, please, inform patient that her B12 is normal: cont w/Vit B12 qd Thx

## 2011-04-22 NOTE — Telephone Encounter (Signed)
Pt not at home. Will try later.

## 2011-04-22 NOTE — Telephone Encounter (Signed)
Patient notified

## 2011-04-22 NOTE — Progress Notes (Signed)
Quick Note:  Let her know that she does not have celiac disease Can discuss when I return  Her B12 level is also much better ______

## 2011-05-29 LAB — CBC
HCT: 31.5 — ABNORMAL LOW
HCT: 34.1 — ABNORMAL LOW
Hemoglobin: 10.7 — ABNORMAL LOW
Hemoglobin: 11.5 — ABNORMAL LOW
MCHC: 34
MCV: 83.7
RBC: 3.76 — ABNORMAL LOW
RBC: 4.08
RDW: 14
WBC: 10.2

## 2011-06-03 ENCOUNTER — Ambulatory Visit (HOSPITAL_BASED_OUTPATIENT_CLINIC_OR_DEPARTMENT_OTHER)
Admission: RE | Admit: 2011-06-03 | Discharge: 2011-06-03 | Disposition: A | Discharge status: Home / Self Care | Payer: 59 | Source: Ambulatory Visit | Attending: Otolaryngology | Admitting: Otolaryngology

## 2011-06-03 DIAGNOSIS — J342 Deviated nasal septum: Secondary | ICD-10-CM | POA: Insufficient documentation

## 2011-06-03 DIAGNOSIS — J343 Hypertrophy of nasal turbinates: Secondary | ICD-10-CM | POA: Insufficient documentation

## 2011-06-03 DIAGNOSIS — J45909 Unspecified asthma, uncomplicated: Secondary | ICD-10-CM | POA: Insufficient documentation

## 2011-06-03 DIAGNOSIS — J3489 Other specified disorders of nose and nasal sinuses: Secondary | ICD-10-CM | POA: Insufficient documentation

## 2011-06-05 LAB — POCT I-STAT, CHEM 8
BUN: 11
Calcium, Ion: 1.22
Chloride: 103
HCT: 42
Potassium: 3.7

## 2011-06-05 LAB — TSH: TSH: 1.78

## 2011-06-10 LAB — POCT URINALYSIS DIP (DEVICE)
Bilirubin Urine: NEGATIVE
Glucose, UA: NEGATIVE mg/dL
Ketones, ur: NEGATIVE mg/dL
Nitrite: NEGATIVE
pH: 7 (ref 5.0–8.0)

## 2011-06-10 LAB — POCT PREGNANCY, URINE: Preg Test, Ur: NEGATIVE

## 2011-06-10 NOTE — Op Note (Signed)
NAMEBALJIT, LIEBERT           ACCOUNT NO.:  [id number]  MEDICAL RECORD NO.:  [id number]  LOCATION:                                 FACILITY:  PHYSICIAN:  Newman Pies, MD                 DATE OF BIRTH:  DATE OF PROCEDURE:  06/03/2011 DATE OF DISCHARGE:                              OPERATIVE REPORT   SURGEON:  Newman Pies, MD  PREOPERATIVE DIAGNOSES: 1. Nasal septal deviation. 2. Bilateral inferior turbinate hypertrophy. 3. Chronic nasal obstruction.  POSTOPERATIVE DIAGNOSES: 1. Nasal septal deviation. 2. Bilateral inferior turbinate hypertrophy. 3. Chronic nasal obstruction.  PROCEDURE PERFORMED: 1. Septoplasty. 2. Bilateral partial inferior turbinate resection.  ANESTHESIA:  General endotracheal tube anesthesia.  COMPLICATIONS:  None.  ESTIMATED BLOOD LOSS:  300 mL.  INDICATIONS FOR PROCEDURE:  The patient is a 30 year old female with a history of chronic nasal obstruction and recurrent sinusitis.  She was previously treated with multiple courses of antibiotics.  Her sinusitis has been under control.  However, she continues to have significant nasal obstruction.  On examination, she was noted to have bidirectional nasal septal deviation and significant bilateral inferior turbinate hypertrophy.  She was treated with systemic and topical steroids, decongestant, and antihistamines without significant improvement in her symptoms.  Based on the above findings, the decision was made for the patient to undergo the above-stated procedures.  The risks, benefits, alternatives, and details of the procedures were discussed with the patient.  Questions were invited and answered.  Informed consent was obtained.  DESCRIPTION:  The patient was taken to the operating room and placed supine on the operating table.  General endotracheal tube anesthesia was administered by the anesthesiologist.  Preop IV antibiotics was given. Pledgets soaked with Afrin were placed in both nasal  cavities for decongestant.  Both pledgets were subsequently removed.  Examination of both nasal cavities revealed bidirectional nasal septal deviation, worse on the right side.  Both inferior turbinates were significantly hypertrophied.  1% lidocaine with 1:100,000 epinephrine was injected onto the nasal septum bilaterally.  A standard hemitransfixion incision was made on the left side.  The mucosal flap was elevated on the left side in a standard fashion.  A cartilaginous incision was made approximately 1 cm superior to the caudal margin of the nasal septum. The submucosal flap was elevated on the contralateral side in a standard fashion.  The deviated portion of the cartilaginous and bony septum were removed.  The cartilage was morselized and replaced.  The septum was quilted with 4-0 plain gut sutures.  The hemitransfixion incision was closed with interrupted chromic sutures.  Attention was then focused on the inferior turbinates.  The inferior one- half of each inferior turbinate was crossclamped with a straight Kelly clamp.  The inferior one-half of each inferior turbinate was then resected with a pair of cross cutting scissors.  Hemostasis was achieved with suction electrocautery.  Doyle splint was applied to each side of the nasal septum.  The splints were secured in place with 3-0 Prolene sutures.  That concluded procedure for the patient.  The care of the patient was turned over to the anesthesiologist.  The patient was awakened  from anesthesia without difficulty.  She was extubated and transferred to the recovery room in good condition.  OPERATIVE FINDINGS: 1. Nasal septal deviation. 2. Bilateral inferior turbinate hypertrophy.  SPECIMEN:  None.  FOLLOWUP CARE:  The patient will be placed on Vicodin 1-2 tablets p.o. q.4-6 h. p.r.n. pain, and amoxicillin 500 mg p.o. t.i.d. for 4 days. The patient will follow up in my office in 3 days for splint removal.     Newman Pies,  MD     ST/MEDQ  D:  06/03/2011  T:  06/03/2011  Job:  [id number]  cc:   Georgina Quint. Plotnikov, MD  Electronically Signed by Newman Pies MD on 06/10/2011 09:45:11 AM

## 2011-06-13 LAB — URINALYSIS, ROUTINE W REFLEX MICROSCOPIC
Bilirubin Urine: NEGATIVE
Nitrite: NEGATIVE
Specific Gravity, Urine: <1.005 — ABNORMAL LOW
Urobilinogen, UA: 0.2
pH: 6.5

## 2011-06-19 LAB — CBC
Hemoglobin: 12.3
MCHC: 33.8
RBC: 4.15
WBC: 11.3 — ABNORMAL HIGH

## 2011-06-29 ENCOUNTER — Inpatient Hospital Stay (INDEPENDENT_AMBULATORY_CARE_PROVIDER_SITE_OTHER)
Admission: RE | Admit: 2011-06-29 | Discharge: 2011-06-29 | Disposition: A | Discharge status: Home / Self Care | Payer: 59 | Source: Ambulatory Visit | Attending: Family Medicine | Admitting: Family Medicine

## 2011-06-29 DIAGNOSIS — H109 Unspecified conjunctivitis: Secondary | ICD-10-CM

## 2011-06-29 DIAGNOSIS — J069 Acute upper respiratory infection, unspecified: Secondary | ICD-10-CM

## 2011-08-06 ENCOUNTER — Telehealth: Payer: Self-pay | Admitting: Internal Medicine

## 2011-08-06 DIAGNOSIS — D509 Iron deficiency anemia, unspecified: Secondary | ICD-10-CM

## 2011-08-06 DIAGNOSIS — D649 Anemia, unspecified: Secondary | ICD-10-CM

## 2011-08-06 HISTORY — DX: Iron deficiency anemia, unspecified: D50.9

## 2011-08-06 MED ORDER — FERROUS SULFATE 325 (65 FE) MG PO TABS: 325.0000 mg | ORAL_TABLET | Freq: Every day | ORAL | Status: DC

## 2011-08-06 NOTE — Telephone Encounter (Signed)
Hgb 10.2 and MCV 79 on health screen labs no GI bleeding Still menstruating Advised to take Fe So4 daily We will recheck CBC in 1 month

## 2011-08-12 ENCOUNTER — Telehealth: Payer: Self-pay | Admitting: Internal Medicine

## 2011-08-12 NOTE — Telephone Encounter (Signed)
Personal conversation, yesterday she had bright red blood per rectum with some mucus. This was the first time in a long time that she seen this period in the past she had a flexible sigmoidoscopy to evaluate rectal bleeding such as this and it was unremarkable. I have advised her to monitor this, try to use her MiraLax to control her bowel movements though that was not hard she does admit she has been straining for stool lately. If this is persistent she may need further investigation.

## 2011-08-26 ENCOUNTER — Emergency Department (HOSPITAL_COMMUNITY)
Admission: EM | Admit: 2011-08-26 | Discharge: 2011-08-27 | Disposition: A | Discharge status: Home / Self Care | Payer: 59 | Attending: Emergency Medicine | Admitting: Emergency Medicine

## 2011-08-26 ENCOUNTER — Encounter (HOSPITAL_COMMUNITY): Payer: Self-pay | Admitting: *Deleted

## 2011-08-26 DIAGNOSIS — R10819 Abdominal tenderness, unspecified site: Secondary | ICD-10-CM | POA: Insufficient documentation

## 2011-08-26 DIAGNOSIS — R05 Cough: Secondary | ICD-10-CM | POA: Insufficient documentation

## 2011-08-26 DIAGNOSIS — R059 Cough, unspecified: Secondary | ICD-10-CM | POA: Insufficient documentation

## 2011-08-26 DIAGNOSIS — R109 Unspecified abdominal pain: Secondary | ICD-10-CM | POA: Insufficient documentation

## 2011-08-26 DIAGNOSIS — K5900 Constipation, unspecified: Secondary | ICD-10-CM | POA: Insufficient documentation

## 2011-08-26 NOTE — ED Notes (Signed)
Pt in c/o epigastric pain that radiates around left flank and into back, with nausea

## 2011-08-27 ENCOUNTER — Emergency Department (HOSPITAL_COMMUNITY): Payer: 59

## 2011-08-27 LAB — CBC
HCT: 34.9 % — ABNORMAL LOW (ref 36.0–46.0)
Hemoglobin: 10.8 g/dL — ABNORMAL LOW (ref 12.0–15.0)
MCH: 23.4 pg — ABNORMAL LOW (ref 26.0–34.0)
MCV: 75.7 fL — ABNORMAL LOW (ref 78.0–100.0)
Platelets: 295 10*3/uL (ref 150–400)
RBC: 4.61 MIL/uL (ref 3.87–5.11)
WBC: 8.4 10*3/uL (ref 4.0–10.5)

## 2011-08-27 LAB — COMPREHENSIVE METABOLIC PANEL
ALT: 14 U/L (ref 0–35)
Alkaline Phosphatase: 74 U/L (ref 39–117)
BUN: 11 mg/dL (ref 6–23)
CO2: 27 mEq/L (ref 19–32)
Calcium: 9.9 mg/dL (ref 8.4–10.5)
GFR calc Af Amer: >90 mL/min (ref >90)
GFR calc non Af Amer: >90 mL/min (ref >90)
Glucose, Bld: 110 mg/dL — ABNORMAL HIGH (ref 70–99)
Sodium: 139 mEq/L (ref 135–145)

## 2011-08-27 LAB — DIFFERENTIAL
Eosinophils Absolute: 0.8 10*3/uL — ABNORMAL HIGH (ref 0.0–0.7)
Eosinophils Relative: 9 % — ABNORMAL HIGH (ref 0–5)
Lymphocytes Relative: 24 % (ref 12–46)
Lymphs Abs: 2 10*3/uL (ref 0.7–4.0)
Monocytes Relative: 4 % (ref 3–12)

## 2011-08-27 LAB — LIPASE, BLOOD: Lipase: 47 U/L (ref 11–59)

## 2011-08-27 MED ORDER — ONDANSETRON HCL 4 MG/2ML IJ SOLN
4.0000 mg | Freq: Once | INTRAMUSCULAR | Status: AC
  Administered 2011-08-27: 4 mg via INTRAVENOUS
  Filled 2011-08-27: qty 2

## 2011-08-27 MED ORDER — MORPHINE SULFATE 4 MG/ML IJ SOLN
4.0000 mg | Freq: Once | INTRAMUSCULAR | Status: DC
  Filled 2011-08-27: qty 1

## 2011-08-27 MED ORDER — SODIUM CHLORIDE 0.9 % IV BOLUS (SEPSIS)
1000.0000 mL | Freq: Once | INTRAVENOUS | Status: AC
  Administered 2011-08-27: 1000 mL via INTRAVENOUS

## 2011-08-27 NOTE — ED Notes (Signed)
Pt alert, request no pain medication @ this time, cont to monitor

## 2011-08-27 NOTE — ED Notes (Signed)
Pt alert, nad, c/o left flank pain, onset was this evening, denies trauma or injury, denies changes in bowel or bladder, pt ambulates to restroom, provided urine sample

## 2011-08-27 NOTE — ED Provider Notes (Signed)
History     CSN: [id number] Arrival date & time: 08/26/2011 11:09 PM   First MD Initiated Contact with Patient 08/27/11 0058      Chief Complaint  Patient presents with  . Abdominal Pain    (Consider location/radiation/quality/duration/timing/severity/associated sxs/prior treatment) HPI Comments: Patient has long history of GERD and states will intermittently get epigastric pain several hours after eating however today's pain moved from the epigastric area to the left upper quadrant. She states she has never had pain in her left upper quadrant before today. Denies any issues with her gallbladder. No fevers. States she has had a cough for last few days but denies any shortness of breath.  Patient is a 30 y.o. female presenting with abdominal pain. The history is provided by the patient.  Abdominal Pain The primary symptoms of the illness include abdominal pain and nausea. The primary symptoms of the illness do not include fever, shortness of breath, vomiting, diarrhea, dysuria, vaginal discharge or vaginal bleeding. The current episode started 3 to 5 hours ago. The onset of the illness was sudden. The problem has not changed since onset. The abdominal pain is located in the epigastric region and LUQ. The abdominal pain radiates to the back. The severity of the abdominal pain is 9/10. The abdominal pain is relieved by nothing. The abdominal pain is exacerbated by eating (Palpation).  The patient states that she believes she is currently not pregnant. Additional symptoms associated with the illness include anorexia and constipation. Significant associated medical issues include GERD.    Past Medical History  Diagnosis Date  . Asthma   . Anxiety     and panic attacks  . GERD (gastroesophageal reflux disease)   . Allergic rhinitis   . Migraine   . Chronic constipation   . Anemia   . H. pylori infection 2008    Hx of tx + serology    History reviewed. No pertinent past surgical  history.  Family History  Problem Relation Age of Onset  . Diabetes Other   . Hypertension Other     History  Substance Use Topics  . Smoking status: Never Smoker   . Smokeless tobacco: Not on file  . Alcohol Use: No    OB History    Grav Para Term Preterm Abortions TAB SAB Ect Mult Living                  Review of Systems  Constitutional: Negative for fever.  Respiratory: Negative for shortness of breath.   Gastrointestinal: Positive for nausea, abdominal pain, constipation and anorexia. Negative for vomiting and diarrhea.  Genitourinary: Negative for dysuria, vaginal bleeding and vaginal discharge.  All other systems reviewed and are negative.    Allergies  Cephalexin and Iodine  Home Medications   Current Outpatient Rx  Name Route Sig Dispense Refill  . BUDESONIDE-FORMOTEROL FUMARATE 160-4.5 MCG/ACT IN AERO Inhalation Inhale 2 puffs into the lungs 2 (two) times daily.     [name] CETIRIZINE-PSEUDOEPHEDRINE ER 5-120 MG PO TB12 Oral Take 1 tablet by mouth 2 (two) times daily.      [name] VITAMIN B-12 1000 MCG SL SUBL Sublingual Place 1 tablet (1,000 mcg total) under the tongue daily. 100 tablet 3  . DEXLANSOPRAZOLE 60 MG PO CPDR Oral Take 60 mg by mouth daily as needed. For gerd    . POLYETHYLENE GLYCOL 3350 PO POWD Oral Take 17 g by mouth daily. 527 g 6  . TRIAMCINOLONE ACETONIDE 0.5 % EX CREA Topical Apply topically  3 (three) times daily. 60 g 0    BP 99/52  Pulse 100  Temp(Src) 97.9 F (36.6 C) (Oral)  Resp 20  SpO2 100%  Physical Exam  Nursing note and vitals reviewed. Constitutional: She is oriented to person, place, and time. She appears well-developed and well-nourished. She appears distressed.  HENT:  Head: Normocephalic and atraumatic.  Mouth/Throat: Oropharynx is clear and moist.  Eyes: EOM are normal. Pupils are equal, round, and reactive to light.  Neck: Normal range of motion. Neck supple.  Cardiovascular: Normal rate, regular rhythm, normal heart  sounds and intact distal pulses.  Exam reveals no friction rub.   No murmur heard. Pulmonary/Chest: Effort normal. She has no wheezes. She has rhonchi in the right middle field and the left upper field. She has no rales.  Abdominal: Soft. Normal appearance and bowel sounds are normal. She exhibits no distension. There is tenderness in the epigastric area and left upper quadrant. There is guarding. There is no rigidity, no rebound and no CVA tenderness.  Musculoskeletal: Normal range of motion. She exhibits no tenderness.       No edema  Neurological: She is alert and oriented to person, place, and time. No cranial nerve deficit.  Skin: Skin is warm and dry. No rash noted.  Psychiatric: She has a normal mood and affect. Her behavior is normal.    ED Course  Procedures (including critical care time)  Labs Reviewed  CBC - Abnormal; Notable for the following:    Hemoglobin 10.8 (*)    HCT 34.9 (*)    MCV 75.7 (*)    MCH 23.4 (*)    All other components within normal limits  DIFFERENTIAL - Abnormal; Notable for the following:    Eosinophils Relative 9 (*)    Eosinophils Absolute 0.8 (*)    All other components within normal limits  COMPREHENSIVE METABOLIC PANEL - Abnormal; Notable for the following:    Glucose, Bld 110 (*)    Total Protein 8.4 (*)    Total Bilirubin 0.1 (*)    All other components within normal limits  LIPASE, BLOOD   Dg Chest 2 View  08/27/2011  *RADIOLOGY REPORT*  Clinical Data: Cough  CHEST - 2 VIEW  Comparison: [place] chest radiographs dated 09/04/2010  Findings: Lungs are clear. No pleural effusion or pneumothorax.  Cardiomediastinal silhouette is within normal limits.  Visualized osseous structures are within normal limits.  IMPRESSION: No evidence of acute cardiopulmonary disease.  Original Report Authenticated By: Charline Bills, M.D.   Dg Abd 1 View  08/27/2011  *RADIOLOGY REPORT*  Clinical Data: Constipation  ABDOMEN - 1 VIEW  Comparison: 07/15/2008   Findings: Moderate stool burden.  Nonobstructive bowel gas pattern. Lung bases are clear.  No acute osseous abnormality.  Organ outlines normal where seen.  IMPRESSION: Nonobstructive bowel gas pattern. Moderate stool burden.  Original Report Authenticated By: Waneta Martins, M.D.     No diagnosis found.    MDM   Patient with abrupt abrupt onset of abdominal pain today not related with eating which radiated onto her left side and in her back. She denied any shortness of breath or chest pain. She states she's had a cough for the last few days but does not feel that it is her asthma. No urinary or vaginal complaints. Doubt a GU source at this time. The patient suffers from GERD and states she will get pain intermittently in the epigastric area but usually never over and the left upper quadrant. She  has no history of gallstones. Will check CBC, CMP, lipase to rule out infection or pancreatitis. Abdomen is soft with no peritoneal signs. Chest x-ray due to some rhonchi on exam was within normal limits. All labs within normal limits. When I went back to evaluate patient and tell her about the labs she admitted that she has chronic constipation and felt that the symptoms may be due to constipation a KUB was ordered and showed a moderate stool burden which is most likely the cause of her pain. She is followed by [facility] and she will speak with him tomorrow about further management.         Gwyneth Sprout, MD 08/27/11 [phone]

## 2011-08-28 ENCOUNTER — Telehealth: Payer: Self-pay | Admitting: *Deleted

## 2011-08-28 ENCOUNTER — Encounter: Payer: Self-pay | Admitting: Physician Assistant

## 2011-08-28 ENCOUNTER — Ambulatory Visit: Payer: 59 | Admitting: Physician Assistant

## 2011-08-28 ENCOUNTER — Ambulatory Visit (INDEPENDENT_AMBULATORY_CARE_PROVIDER_SITE_OTHER): Payer: 59 | Admitting: Physician Assistant

## 2011-08-28 VITALS — BP 126/64 | HR 78 | Ht 63.0 in | Wt 193.0 lb

## 2011-08-28 DIAGNOSIS — R110 Nausea: Secondary | ICD-10-CM

## 2011-08-28 DIAGNOSIS — R1013 Epigastric pain: Secondary | ICD-10-CM

## 2011-08-28 DIAGNOSIS — M2550 Pain in unspecified joint: Secondary | ICD-10-CM

## 2011-08-28 MED ORDER — ONDANSETRON HCL 4 MG PO TABS: ORAL_TABLET | ORAL | Status: DC

## 2011-08-28 NOTE — Telephone Encounter (Signed)
Sorry, what is the leg problem? Thx

## 2011-08-28 NOTE — Patient Instructions (Signed)
We sent prescription for Zofran 4 mg for nausea., Take as directed.  [facility], [address].  Take the Dexilant  Daily, 1 tablet 60 mg for 14 days.  Please let us know if your pain worsens or the episodes of nausea and vomiting increase.

## 2011-08-28 NOTE — Telephone Encounter (Signed)
Pt is requesting a referral for a doppler for her legs, pt state that MD is aware of leg problems.

## 2011-08-29 ENCOUNTER — Encounter: Payer: Self-pay | Admitting: Physician Assistant

## 2011-08-29 NOTE — Progress Notes (Signed)
Subjective:    Patient ID: Deanna Schwartz, female    DOB: 1981/02/19, 30 y.o.   MRN: [id number]  HPI  Es is a 30 year old female known to Dr. Leone Payor who is also an employee of [facility]  [employer]. She does have prior history of gastritis and H. pylori which was treated. She had upper endoscopy in 2009. She also has history of GERD and uses when necessary DEXA long period She comes in today with complaints of upper abdominal pain over the past one week which seems to be exacerbated postprandially within one to 2 hours of eating. She says she had severe pain on Tuesday 1218 that doubled her over and was associated with nausea. She describes the pain as being in the epigastrium and left upper quadrant and is burning in nature. She is also had some mild diarrhea over the past couple of days with no melena or hematochezia. She denies any fever or chills. She has no family members currently ill. She actually went to the emergency room on the evening of 1218 because her pain was intense. She did have a KUB done which was unremarkable and labs including CBC cemented lipase all unremarkable with the exception of a hemoglobin of 10.8 hematocrit of 34.9 and an MCV of 75.7. She does have prior history of anemia as well as B12 deficiency and says she has been on iron previously- She was started back on iron in November She denies menorrhagia.  She relates that she has been having some pain bilaterally in her legs and has been using Motrin regularly for this over the past month. Her last menstrual period was within the past 2 weeks..    Review of Systems  Constitutional: Positive for appetite change.  HENT: Negative.   Eyes: Negative.   Respiratory: Negative.   Cardiovascular: Negative.   Gastrointestinal: Positive for nausea, vomiting, abdominal pain and diarrhea.  Genitourinary: Negative.   Musculoskeletal: Positive for arthralgias.  Neurological: Negative.   Hematological: Negative.   Psychiatric/Behavioral:  Negative.    Outpatient Prescriptions Prior to Visit  Medication Sig Dispense Refill  . cetirizine-pseudoephedrine (ZYRTEC-D) 5-120 MG per tablet Take 1 tablet by mouth 2 (two) times daily.        [name] dexlansoprazole (DEXILANT) 60 MG capsule Take 60 mg by mouth daily as needed. For gerd      . polyethylene glycol powder (MIRALAX) powder Take 17 g by mouth daily.  527 g  6  . budesonide-formoterol (SYMBICORT) 160-4.5 MCG/ACT inhaler Inhale 2 puffs into the lungs 2 (two) times daily.       . Cyanocobalamin (VITAMIN B-12) 1000 MCG SUBL Place 1 tablet (1,000 mcg total) under the tongue daily.  100 tablet  3  . triamcinolone (KENALOG) 0.5 % cream Apply topically 3 (three) times daily.  60 g  0       Allergies  Allergen Reactions  . Cephalexin     REACTION: nausea  . Iodine     Objective:   Physical Exam well-developed young Hispanic female in no acute distress, pleasant alert and oriented x3 HEENT not hematocrit normocephalic EOMI PERRLA sclera anicteric, Is supple no JVD, Cardiovascular regular rate and rhythm with S1-S2, Pulmonary clear bilaterally, Abdomen soft he is tender in the epigastrium and left upper quadrant but also mildly tender rather diffusely there is no guarding no rebound no mass or palpable hepatosplenomegaly, bowel sounds are active, Rectal not done, Extremities no clubbing cyanosis or edema, Psych mood and affect normal an appropriate  Assessment & Plan:  #3 30 year old female with one-week history of upper abdominal discomfort associated with 2-3 day history of diarrhea nausea and intermittent vomiting. I suspect she may have a viral gastroenteritis, also suspected gastritis probably NSAID-induced.  Plan; as patient has felt better over the past 24 hours will not pursue any further diagnostic workup at this time. Have asked her to take her  Dexialnt 60 mg by mouth daily on a regular basis over the next 2 weeks and not when necessary. Stop Motrin and NSAID use Add  Zofran 4 mg every 6 hours as needed for nausea  . she was advised that should her symptoms persist or worsen that we may need to get further imaging.

## 2011-08-29 NOTE — Telephone Encounter (Signed)
Pt informed

## 2011-08-29 NOTE — Telephone Encounter (Signed)
[place] will not help w/dx We will sch a Rheumatol consult Thx

## 2011-08-29 NOTE — Telephone Encounter (Signed)
Severe bilateral leg and joint pain.

## 2011-08-30 NOTE — Progress Notes (Signed)
Reviewed and agree. She will need oral iron supplements  ASAP providing she can tolerate oral iron

## 2011-09-11 ENCOUNTER — Ambulatory Visit: Payer: 59 | Admitting: Internal Medicine

## 2011-09-24 ENCOUNTER — Encounter: Payer: Self-pay | Admitting: Internal Medicine

## 2011-09-24 ENCOUNTER — Encounter: Payer: Self-pay | Admitting: *Deleted

## 2011-09-24 ENCOUNTER — Ambulatory Visit (INDEPENDENT_AMBULATORY_CARE_PROVIDER_SITE_OTHER): Payer: 59 | Admitting: Internal Medicine

## 2011-09-24 VITALS — BP 110/72 | HR 118 | Temp 99.1°F

## 2011-09-24 DIAGNOSIS — J069 Acute upper respiratory infection, unspecified: Secondary | ICD-10-CM

## 2011-09-24 DIAGNOSIS — J45901 Unspecified asthma with (acute) exacerbation: Secondary | ICD-10-CM

## 2011-09-24 MED ORDER — METHYLPREDNISOLONE ACETATE 80 MG/ML IJ SUSP
120.0000 mg | Freq: Once | INTRAMUSCULAR | Status: AC
  Administered 2011-09-24: 120 mg via INTRAMUSCULAR

## 2011-09-24 MED ORDER — OSELTAMIVIR PHOSPHATE 75 MG PO CAPS: 75.0000 mg | ORAL_CAPSULE | Freq: Two times a day (BID) | ORAL | Status: AC

## 2011-09-24 MED ORDER — PREDNISONE (PAK) 10 MG PO TABS: 10.0000 mg | ORAL_TABLET | ORAL | Status: AC

## 2011-09-24 NOTE — Patient Instructions (Signed)
It was good to see you today. Steroid shot given today Tamiflu and pred taper for your flu and asthma symptoms - Your prescription(s) have been submitted to your pharmacy. Please take as directed and contact our office if you believe you are having problem(s) with the medication(s). Work note for next 2 days  Hydrate, rest and Alternate between ibuprofen and tylenol for aches, pain and fever symptoms as discussed Warm salt water gargle and use Albuterol inhaler as needed - call if symptoms worse or unimproved

## 2011-09-24 NOTE — Progress Notes (Signed)
  Subjective:    HPI  complains of URI symptoms  Onset 3 days ago, wax/wane symptoms  associated with rhinorrhea, sneezing, sore throat, mild headache and low grade fever Also myalgias, sinus pressure and mild-mod chest congestion - wheeze and excessive use of Alb MDI past 2 days No relief with OTC meds Precipitated by sick contacts - kids with flu  Past Medical History  Diagnosis Date  . Asthma   . Anxiety     and panic attacks  . GERD (gastroesophageal reflux disease)   . Allergic rhinitis   . Migraine   . Chronic constipation   . Anemia   . H. pylori infection 2008    Hx of tx + serology    Review of Systems Constitutional: No night sweats, no unexpected weight change Pulmonary: No pleurisy or hemoptysis Cardiovascular: No chest pain or palpitations     Objective:   Physical Exam BP 110/72  Pulse 118  Temp(Src) 99.1 F (37.3 C) (Oral)  SpO2 98% GEN: mildly ill appearing and audible head/chest congestion HENT: NCAT, nares with clear discharge, oropharynx mild erythema, no exudate Eyes: Vision grossly intact, no conjunctivitis Lungs: B exp wheeze, but no increased work of breathing at rest Cardiovascular: Regular rate and rhythm, no bilateral edema      Assessment & Plan:  Viral URI > suspect flu given household contacts with same Asthma exacerbation triggered by same    Empiric Tamiflu prescribed due to classic symptom and exposure Steroids for bronchospasm - IM and pred taper, refill Alb MDI - new prescriptions done Symptomatic care with Tylenol or Advil, hydration and rest -  salt gargle advised as needed Work note for remainder of the week provided

## 2011-10-14 ENCOUNTER — Other Ambulatory Visit: Payer: Self-pay

## 2011-10-14 DIAGNOSIS — D649 Anemia, unspecified: Secondary | ICD-10-CM

## 2011-10-15 ENCOUNTER — Other Ambulatory Visit (INDEPENDENT_AMBULATORY_CARE_PROVIDER_SITE_OTHER): Payer: 59

## 2011-10-15 DIAGNOSIS — D649 Anemia, unspecified: Secondary | ICD-10-CM

## 2011-10-15 LAB — CBC WITH DIFFERENTIAL/PLATELET
Basophils Absolute: 0 10*3/uL (ref 0.0–0.1)
Eosinophils Absolute: 0.5 10*3/uL (ref 0.0–0.7)
Lymphocytes Relative: 21.8 % (ref 12.0–46.0)
MCHC: 32.4 g/dL (ref 30.0–36.0)
Monocytes Relative: 8.1 % (ref 3.0–12.0)
Neutrophils Relative %: 64 % (ref 43.0–77.0)
RDW: 18.6 % — ABNORMAL HIGH (ref 11.5–14.6)

## 2011-10-15 NOTE — Progress Notes (Signed)
Quick Note:  Hgb same Is she taking her iron? Is she having rectal bleeding or heavy periods?  ______

## 2011-10-16 ENCOUNTER — Other Ambulatory Visit: Payer: Self-pay

## 2011-10-16 DIAGNOSIS — D649 Anemia, unspecified: Secondary | ICD-10-CM

## 2011-10-16 NOTE — Progress Notes (Signed)
Quick Note:  Needs to take iron tabs 3 times a day and would take with some orange juice or vitamin C In 6 weeks do a CBC, ferritin and B12 level re: anemia ______

## 2011-10-22 ENCOUNTER — Other Ambulatory Visit: Payer: Self-pay | Admitting: *Deleted

## 2011-10-22 MED ORDER — AZITHROMYCIN 250 MG PO TABS: ORAL_TABLET | ORAL | Status: AC

## 2011-10-22 NOTE — Telephone Encounter (Signed)
Ok zpac done Thx 

## 2011-10-22 NOTE — Telephone Encounter (Signed)
Pt is requesting a ABX for a sinus infection to go to [facility]

## 2011-10-23 NOTE — Telephone Encounter (Signed)
Pt informed rx sent to pharmacy.

## 2011-12-24 ENCOUNTER — Encounter: Payer: Self-pay | Admitting: *Deleted

## 2011-12-29 ENCOUNTER — Ambulatory Visit (INDEPENDENT_AMBULATORY_CARE_PROVIDER_SITE_OTHER): Payer: 59 | Admitting: Internal Medicine

## 2011-12-29 ENCOUNTER — Other Ambulatory Visit (INDEPENDENT_AMBULATORY_CARE_PROVIDER_SITE_OTHER): Payer: 59

## 2011-12-29 ENCOUNTER — Telehealth: Payer: Self-pay

## 2011-12-29 ENCOUNTER — Encounter: Payer: Self-pay | Admitting: Internal Medicine

## 2011-12-29 ENCOUNTER — Ambulatory Visit: Payer: 59 | Admitting: Internal Medicine

## 2011-12-29 VITALS — BP 124/70 | HR 80 | Ht 63.0 in | Wt 190.6 lb

## 2011-12-29 DIAGNOSIS — D509 Iron deficiency anemia, unspecified: Secondary | ICD-10-CM

## 2011-12-29 DIAGNOSIS — R1013 Epigastric pain: Secondary | ICD-10-CM

## 2011-12-29 DIAGNOSIS — D649 Anemia, unspecified: Secondary | ICD-10-CM

## 2011-12-29 DIAGNOSIS — R1110 Vomiting, unspecified: Secondary | ICD-10-CM

## 2011-12-29 LAB — CBC WITH DIFFERENTIAL/PLATELET
Basophils Absolute: 0 10*3/uL (ref 0.0–0.1)
Basophils Relative: 0.6 % (ref 0.0–3.0)
Eosinophils Relative: 10.3 % — ABNORMAL HIGH (ref 0.0–5.0)
Hemoglobin: 11.7 g/dL — ABNORMAL LOW (ref 12.0–15.0)
Lymphocytes Relative: 30.4 % (ref 12.0–46.0)
Monocytes Relative: 5.7 % (ref 3.0–12.0)
Neutro Abs: 4.3 10*3/uL (ref 1.4–7.7)
RBC: 4.62 Mil/uL (ref 3.87–5.11)
WBC: 8.2 10*3/uL (ref 4.5–10.5)

## 2011-12-29 NOTE — Patient Instructions (Signed)
We have given you several dates to check on for [place] to set you up for an endoscopy procedure, let us know what will work and we will proceed.  Please get your future labs in epic done:  Ferritin, CBC, B-12.  Try the generic Bentyl you have on hand for the pain.

## 2011-12-29 NOTE — Progress Notes (Signed)
  Subjective:    Patient ID: Deanna Schwartz, female    DOB: 06-22-1981, 31 y.o.   MRN: [id number]  HPI This 31 year old woman presents with complaints of epigastric pain and vomiting. She's had some problems off and on over the years and is thought to have IBS. She says last week she developed epigastric pain in the setting of increasing nausea and generalized stomach upset. Then she had postprandial vomiting of what she ate several hours after eating. This went on for several days and she even had to induce vomiting to relieve the nausea and vomiting so she would feel better. The pain starts in the epigastrium and radiates in the left upper quadrant some period she has not had any bleeding. She is moving her bowels regularly. Her asthma is under control no allergies or been bothering her. For the past couple of months she's had some right-sided headaches and she's attributed this to allergies and sinus pressure. There are no chronic recurrent headaches. She stopped using ibuprofen last winter when advised by our physician assistant do so.  In the past she had been on dicyclomine for IBS, she had stopped taking that, she said that did seem to help pain in the past. She is not retried it.  There is a history of iron deficiency anemia thought to to menstrual blood loss in pregnancy. She did take a course of iron therapy but has not yet followed up with lab testing as recommended.  She denies significant home or work stress though she is dizzy with 3 children the youngest of which is 17 months.  Medications, allergies, past medical history, past surgical history, family history and social history are reviewed and updated in the EMR.   Review of Systems As above    Objective:   Physical Exam General:  NAD Eyes:   anicteric Lungs:  clear Heart:  S1S2 no rubs, murmurs or gallops Abdomen:  soft and nontender, BS+ Ext:   no edema             Assessment & Plan:   1. Abdominal pain,  epigastric   2. Vomiting   3. Iron deficiency anemia, unspecified    Over the years it does appear that she's had a functional gastrointestinal disturbance. As she is on PPI therapy is unlikely to be ulcer disease but she is having postprandial vomiting after epigastric pain. Upper GI endoscopy will be scheduled to evaluate for possible causes. Risks benefits and indications were explained she understands and agrees to proceed. She will try dicyclomine which she has at home from previous prescription.  She will follow through with CBC, ferritin and B12 level to reassess anemia, she took iron therapy for 6 weeks as recommended but is not currently on it. She has had a low B12 level in the past as well.  Further plans pending these results and clinical course.

## 2011-12-29 NOTE — Telephone Encounter (Signed)
Patient is scheduled for EGD 01/09/12 4:30.  I have given her instructions

## 2011-12-30 NOTE — Progress Notes (Signed)
Quick Note:  Results given to patient She needs to stay on HeMax - samples given Needs to be on this 1/day for at least 3 months and then can recheck CBC and ferritin ______

## 2011-12-31 ENCOUNTER — Other Ambulatory Visit: Payer: Self-pay

## 2011-12-31 DIAGNOSIS — D649 Anemia, unspecified: Secondary | ICD-10-CM

## 2012-01-05 ENCOUNTER — Telehealth: Payer: Self-pay | Admitting: Internal Medicine

## 2012-01-05 DIAGNOSIS — E538 Deficiency of other specified B group vitamins: Secondary | ICD-10-CM

## 2012-01-05 NOTE — Telephone Encounter (Signed)
B12 level low normal again  She will start oral supplements but needs B12 1000ug im x 1 also  I also ordered future labs anti-parietal cell antibodies and anti-intrinsic factor antibodies for next labs planned later B12 level also  I have explained to her

## 2012-01-05 NOTE — Assessment & Plan Note (Addendum)
Levels decreasing - very low normal now Restart supplements oral and 1 IM Check anti-parietal and anti-intrinsic factor Ab's - will do in July and recheck B12 level then

## 2012-01-06 ENCOUNTER — Ambulatory Visit (INDEPENDENT_AMBULATORY_CARE_PROVIDER_SITE_OTHER): Payer: 59 | Admitting: Internal Medicine

## 2012-01-06 DIAGNOSIS — E538 Deficiency of other specified B group vitamins: Secondary | ICD-10-CM

## 2012-01-06 MED ORDER — CYANOCOBALAMIN 1000 MCG/ML IJ SOLN
1000.0000 ug | Freq: Once | INTRAMUSCULAR | Status: AC
  Administered 2012-01-06: 1000 ug via INTRAMUSCULAR

## 2012-01-06 MED ORDER — CYANOCOBALAMIN 1000 MCG PO TABS: 1000.0000 ug | ORAL_TABLET | Freq: Every day | ORAL | Status: DC

## 2012-01-06 NOTE — Telephone Encounter (Signed)
Patient aware.

## 2012-01-08 ENCOUNTER — Ambulatory Visit (INDEPENDENT_AMBULATORY_CARE_PROVIDER_SITE_OTHER): Payer: 59 | Admitting: Otolaryngology

## 2012-01-09 ENCOUNTER — Other Ambulatory Visit: Payer: 59 | Admitting: Internal Medicine

## 2012-01-09 ENCOUNTER — Encounter: Payer: Self-pay | Admitting: Internal Medicine

## 2012-01-09 ENCOUNTER — Ambulatory Visit (AMBULATORY_SURGERY_CENTER): Payer: 59 | Admitting: Internal Medicine

## 2012-01-09 VITALS — HR 96 | Temp 95.6°F | Ht 63.0 in | Wt 190.0 lb

## 2012-01-09 DIAGNOSIS — R109 Unspecified abdominal pain: Secondary | ICD-10-CM

## 2012-01-09 DIAGNOSIS — D131 Benign neoplasm of stomach: Secondary | ICD-10-CM

## 2012-01-09 DIAGNOSIS — R1013 Epigastric pain: Secondary | ICD-10-CM

## 2012-01-09 DIAGNOSIS — A048 Other specified bacterial intestinal infections: Secondary | ICD-10-CM

## 2012-01-09 DIAGNOSIS — R1110 Vomiting, unspecified: Secondary | ICD-10-CM

## 2012-01-09 MED ORDER — SODIUM CHLORIDE 0.9 % IV SOLN: 500.0000 mL | INTRAVENOUS | Status: DC

## 2012-01-09 NOTE — Progress Notes (Signed)
Patient did not experience any of the following events: a burn prior to discharge; a fall within the facility; wrong site/side/patient/procedure/implant event; or a hospital transfer or hospital admission upon discharge from the facility. (G8907) Patient did not have preoperative order for IV antibiotic SSI prophylaxis. (G8918)  

## 2012-01-09 NOTE — Op Note (Signed)
[facility] [address] [place], [place]  [address]  ENDOSCOPY PROCEDURE REPORT  PATIENT:  Deanna Schwartz, Deanna Schwartz  MR#:  [id number] BIRTHDATE:  [birth year], 30 yrs. old  GENDER:  female  ENDOSCOPIST:  Iva Boop, MD, [facility]  PROCEDURE DATE:  01/09/2012 PROCEDURE:  EGD with biopsy, 95621 ASA CLASS:  Class II INDICATIONS:  epigastric pain, vomiting  MEDICATIONS:   These medications were titrated to patient response per physician's verbal order, Fentanyl 25 mcg IV, Versed 5 mg IV TOPICAL ANESTHETIC:  Cetacaine Spray  DESCRIPTION OF PROCEDURE:   After the risks benefits and alternatives of the procedure were thoroughly explained, informed consent was obtained.  The LB [id number] endoscope was introduced through the mouth and advanced to the second portion of the duodenum, without limitations.  The instrument was slowly withdrawn as the mucosa was fully examined. <<PROCEDUREIMAGES>>  Two polyps were found in the body of the stomach. Diminutive polyps in proximal body. Removed with forceps. Multiple biopsies were obtained and sent to pathology.  A hiatal hernia was found. It was 2 cm in size.  Otherwise the examination was normal. Retroflexed views revealed a hiatal hernia.    The scope was then withdrawn from the patient and the procedure completed.  COMPLICATIONS:  None  ENDOSCOPIC IMPRESSION: 1) Two diminutive removed from the body of the stomach 2) 2 cm hiatal hernia 3) Otherwise normal examination RECOMMENDATIONS: 1) Await biopsy results-will discuss with patient  Iva Boop, MD, [facility]  CC:  The Patient  n. eSIGNED:   Iva Boop at 01/09/2012 02:58 PM  [place], [place], [phone]

## 2012-01-09 NOTE — Patient Instructions (Signed)
YOU HAD AN ENDOSCOPIC PROCEDURE TODAY AT THE [facility]: Refer to the procedure report that was given to you for any specific questions about what was found during the examination.  If the procedure report does not answer your questions, please call your gastroenterologist to clarify.  If you requested that your care partner not be given the details of your procedure findings, then the procedure report has been included in a sealed envelope for you to review at your convenience later.  YOU SHOULD EXPECT: Some feelings of bloating in the abdomen. Passage of more gas than usual.  Walking can help get rid of the air that was put into your GI tract during the procedure and reduce the bloating. If you had a lower endoscopy (such as a colonoscopy or flexible sigmoidoscopy) you may notice spotting of blood in your stool or on the toilet paper. If you underwent a bowel prep for your procedure, then you may not have a normal bowel movement for a few days.  DIET: Your first meal following the procedure should be a light meal and then it is ok to progress to your normal diet.  A half-sandwich or bowl of soup is an example of a good first meal.  Heavy or fried foods are harder to digest and may make you feel nauseous or bloated.  Likewise meals heavy in dairy and vegetables can cause extra gas to form and this can also increase the bloating.  Drink plenty of fluids but you should avoid alcoholic beverages for 24 hours.  ACTIVITY: Your care partner should take you home directly after the procedure.  You should plan to take it easy, moving slowly for the rest of the day.  You can resume normal activity the day after the procedure however you should NOT DRIVE or use heavy machinery for 24 hours (because of the sedation medicines used during the test).    SYMPTOMS TO REPORT IMMEDIATELY: A gastroenterologist can be reached at any hour.  During normal business hours, 8:30 AM to 5:00 PM Monday through Friday,  call [phone].  After hours and on weekends, please call the GI answering service at [phone] who will take a message and have the physician on call contact you.   Following lower endoscopy (colonoscopy or flexible sigmoidoscopy):  Excessive amounts of blood in the stool  Significant tenderness or worsening of abdominal pains  Swelling of the abdomen that is new, acute  Fever of 100F or higher  Following upper endoscopy (EGD)  Vomiting of blood or coffee ground material  New chest pain or pain under the shoulder blades  Painful or persistently difficult swallowing  New shortness of breath  Fever of 100F or higher  Black, tarry-looking stools  FOLLOW UP: If any biopsies were taken you will be contacted by phone or by letter within the next 1-3 weeks.  Call your gastroenterologist if you have not heard about the biopsies in 3 weeks.  Our staff will call the home number listed on your records the next business day following your procedure to check on you and address any questions or concerns that you may have at that time regarding the information given to you following your procedure. This is a courtesy call and so if there is no answer at the home number and we have not heard from you through the emergency physician on call, we will assume that you have returned to your regular daily activities without incident.  SIGNATURES/CONFIDENTIALITY: You and/or your care   partner have signed paperwork which will be entered into your electronic medical record.  These signatures attest to the fact that that the information above on your After Visit Summary has been reviewed and is understood.  Full responsibility of the confidentiality of this discharge information lies with you and/or your care-partner.  

## 2012-01-12 ENCOUNTER — Telehealth: Payer: Self-pay

## 2012-01-12 NOTE — Telephone Encounter (Signed)
  Follow up Call-  Call back number 01/09/2012  Post procedure Call Back phone  # [phone]  Permission to leave phone message Yes     Patient questions:  Do you have a fever, pain , or abdominal swelling? yes Pain Score  0 *  Have you tolerated food without any problems? yes  Have you been able to return to your normal activities? yes  Do you have any questions about your discharge instructions: Diet   no Medications  no Follow up visit  no  Do you have questions or concerns about your Care? no  Actions: * If pain score is 4 or above: No action needed, pain <4.  Per the pt "I did okay". Maw

## 2012-01-13 ENCOUNTER — Encounter (HOSPITAL_COMMUNITY): Payer: Self-pay

## 2012-01-13 ENCOUNTER — Ambulatory Visit: Payer: 59 | Admitting: Internal Medicine

## 2012-01-13 ENCOUNTER — Encounter: Payer: Self-pay | Admitting: Internal Medicine

## 2012-01-13 ENCOUNTER — Inpatient Hospital Stay (HOSPITAL_COMMUNITY)
Admission: AD | Admit: 2012-01-13 | Discharge: 2012-01-16 | DRG: 203 | Disposition: A | Discharge status: Home / Self Care | Payer: 59 | Source: Ambulatory Visit | Attending: Internal Medicine | Admitting: Internal Medicine

## 2012-01-13 ENCOUNTER — Inpatient Hospital Stay (HOSPITAL_COMMUNITY): Payer: 59

## 2012-01-13 ENCOUNTER — Ambulatory Visit (INDEPENDENT_AMBULATORY_CARE_PROVIDER_SITE_OTHER): Payer: 59 | Admitting: Internal Medicine

## 2012-01-13 VITALS — BP 102/60 | HR 113 | Temp 98.6°F | Resp 18 | Wt 188.0 lb

## 2012-01-13 DIAGNOSIS — J45909 Unspecified asthma, uncomplicated: Secondary | ICD-10-CM

## 2012-01-13 DIAGNOSIS — R0602 Shortness of breath: Secondary | ICD-10-CM

## 2012-01-13 DIAGNOSIS — K219 Gastro-esophageal reflux disease without esophagitis: Secondary | ICD-10-CM | POA: Diagnosis present

## 2012-01-13 DIAGNOSIS — F411 Generalized anxiety disorder: Secondary | ICD-10-CM | POA: Diagnosis present

## 2012-01-13 DIAGNOSIS — J45901 Unspecified asthma with (acute) exacerbation: Principal | ICD-10-CM | POA: Diagnosis present

## 2012-01-13 LAB — BASIC METABOLIC PANEL
BUN: 8 mg/dL (ref 6–23)
CO2: 25 mEq/L (ref 19–32)
Chloride: 103 mEq/L (ref 96–112)
GFR calc Af Amer: >90 mL/min (ref >90)
Glucose, Bld: 120 mg/dL — ABNORMAL HIGH (ref 70–99)
Potassium: 3.6 mEq/L (ref 3.5–5.1)

## 2012-01-13 LAB — CBC
HCT: 37.6 % (ref 36.0–46.0)
Hemoglobin: 12 g/dL (ref 12.0–15.0)
RBC: 4.65 MIL/uL (ref 3.87–5.11)
WBC: 9.1 10*3/uL (ref 4.0–10.5)

## 2012-01-13 LAB — DIFFERENTIAL
Eosinophils Relative: 1 % (ref 0–5)
Lymphocytes Relative: 7 % — ABNORMAL LOW (ref 12–46)
Lymphs Abs: 0.6 10*3/uL — ABNORMAL LOW (ref 0.7–4.0)
Monocytes Relative: 0 % — ABNORMAL LOW (ref 3–12)

## 2012-01-13 MED ORDER — METHYLPREDNISOLONE SODIUM SUCC 125 MG IJ SOLR
125.0000 mg | Freq: Once | INTRAMUSCULAR | Status: AC
  Administered 2012-01-13: 125 mg via INTRAMUSCULAR

## 2012-01-13 MED ORDER — POLYETHYLENE GLYCOL 3350 17 GM/SCOOP PO POWD
17.0000 g | Freq: Every day | ORAL | Status: DC
  Administered 2012-01-15 – 2012-01-16 (×2): 17 g via ORAL
  Filled 2012-01-13: qty 255

## 2012-01-13 MED ORDER — METHYLPREDNISOLONE SODIUM SUCC 125 MG IJ SOLR
80.0000 mg | Freq: Four times a day (QID) | INTRAMUSCULAR | Status: DC
  Administered 2012-01-13 – 2012-01-14 (×4): 80 mg via INTRAVENOUS
  Filled 2012-01-13 (×10): qty 1.28

## 2012-01-13 MED ORDER — PANTOPRAZOLE SODIUM 40 MG PO TBEC
40.0000 mg | DELAYED_RELEASE_TABLET | Freq: Every day | ORAL | Status: DC
  Administered 2012-01-14: 40 mg via ORAL
  Filled 2012-01-13 (×3): qty 1

## 2012-01-13 MED ORDER — SODIUM CHLORIDE 0.9 % IJ SOLN: 3.0000 mL | INTRAMUSCULAR | Status: DC | PRN

## 2012-01-13 MED ORDER — SODIUM CHLORIDE 0.9 % IJ SOLN
3.0000 mL | Freq: Two times a day (BID) | INTRAMUSCULAR | Status: DC
  Administered 2012-01-13 – 2012-01-14 (×3): 3 mL via INTRAVENOUS
  Administered 2012-01-14: 10 mL via INTRAVENOUS
  Administered 2012-01-15 – 2012-01-16 (×3): 3 mL via INTRAVENOUS

## 2012-01-13 MED ORDER — ALBUTEROL SULFATE (5 MG/ML) 0.5% IN NEBU: 2.5000 mg | INHALATION_SOLUTION | RESPIRATORY_TRACT | Status: DC | PRN

## 2012-01-13 MED ORDER — POLYETHYLENE GLYCOL 3350 17 G PO PACK
17.0000 g | PACK | Freq: Every day | ORAL | Status: DC | PRN
  Administered 2012-01-14: 17 g via ORAL
  Filled 2012-01-13: qty 1

## 2012-01-13 MED ORDER — ENOXAPARIN SODIUM 40 MG/0.4ML ~~LOC~~ SOLN
40.0000 mg | SUBCUTANEOUS | Status: DC
  Filled 2012-01-13 (×4): qty 0.4

## 2012-01-13 MED ORDER — IBUPROFEN 800 MG PO TABS
400.0000 mg | ORAL_TABLET | Freq: Four times a day (QID) | ORAL | Status: DC | PRN
  Administered 2012-01-13: 400 mg via ORAL
  Filled 2012-01-13: qty 1

## 2012-01-13 MED ORDER — INSPIREASE MISC: 1.0000 | Freq: Once | Status: DC

## 2012-01-13 MED ORDER — SODIUM CHLORIDE 0.9 % IV SOLN: 250.0000 mL | INTRAVENOUS | Status: DC | PRN

## 2012-01-13 MED ORDER — PREDNISONE 20 MG PO TABS: 10.0000 mg | ORAL_TABLET | Freq: Every day | ORAL | Status: AC

## 2012-01-13 MED ORDER — ZOLPIDEM TARTRATE 5 MG PO TABS: 5.0000 mg | ORAL_TABLET | Freq: Every evening | ORAL | Status: DC | PRN

## 2012-01-13 MED ORDER — ALBUTEROL SULFATE (5 MG/ML) 0.5% IN NEBU
2.5000 mg | INHALATION_SOLUTION | Freq: Four times a day (QID) | RESPIRATORY_TRACT | Status: DC
  Administered 2012-01-13 – 2012-01-16 (×12): 2.5 mg via RESPIRATORY_TRACT
  Filled 2012-01-13 (×11): qty 0.5

## 2012-01-13 MED ORDER — METHYLPREDNISOLONE SODIUM SUCC 125 MG IJ SOLR: 125.0000 mg | Freq: Once | INTRAMUSCULAR | Status: DC

## 2012-01-13 MED ORDER — ONDANSETRON HCL 4 MG PO TABS: 4.0000 mg | ORAL_TABLET | Freq: Three times a day (TID) | ORAL | Status: DC | PRN

## 2012-01-13 MED ORDER — GUAIFENESIN ER 600 MG PO TB12
600.0000 mg | ORAL_TABLET | Freq: Two times a day (BID) | ORAL | Status: DC
  Administered 2012-01-13 – 2012-01-16 (×7): 600 mg via ORAL
  Filled 2012-01-13 (×10): qty 1

## 2012-01-13 NOTE — Patient Instructions (Signed)
Flare of asthma - better at this time. Plan - prednisone 30 mg twice a day, starting today, x 2 days, then 40 mg once a day x 3 days, 20 mg once a day for 3 days, 10 mg daily x 6 days; albuterol MDI with inspirease 2 puffs every 6 hours until better. FOR WHEEZING AND SHORTNESS OF BREATH REQUIRING MORE THAN 4 MDI TREATMENTS A DAY - YOU MUST BE SEEN.

## 2012-01-13 NOTE — Progress Notes (Signed)
  Subjective:    Patient ID: Deanna Schwartz, female    DOB: 1981/04/15, 31 y.o.   MRN: [id number]  HPI Deanna Schwartz presents acutely with cough and wheezing. She has had a cold for several days but started having marked wheezing in the last 24 hours. She did use her MDI/albuterol and did use a nebulizer last night with minimal relief. She has not had any fever or productive cough. She is not fatiguing. O2 sat on presentation to the office is 92% on Room Air. She reports that she has been hospitalized for asthma in '05 and she felt about like she feels now. She reports that she has not had an asthma attack for several years.   Past Medical History  Diagnosis Date  . Asthma   . Anxiety   . GERD (gastroesophageal reflux disease)   . Allergic rhinitis   . Migraine   . Chronic constipation   . Anemia   . H. pylori infection 2008    Hx of tx + serology  . Panic attack   . Irritable bowel syndrome    Past Surgical History  Procedure Date  . Nasal sinus surgery   . Flexible sigmoidoscopy 04/14/2008    normal   Family History  Problem Relation Age of Onset  . Diabetes      grandmother  . Hypertension    . Colon cancer Neg Hx    History   Social History  . Marital Status: Married    Spouse Name: N/A    Number of Children: 3  . Years of Education: N/A   Occupational History  . [employer]   Social History Main Topics  . Smoking status: Never Smoker   . Smokeless tobacco: Never Used  . Alcohol Use: Yes     Social   . Drug Use: No  . Sexually Active: Yes   Other Topics Concern  . Not on file   Social History Narrative   HSG, [employer] in [place]. Occupation: [employer]. married  in Oct 2009. Son born in 2009, [place] dtrs - '07, '11. Marriage in good health.       Review of Systems System review is negative for any constitutional, cardiac, pulmonary, GI or neuro symptoms or complaints other than as described in the HPI.     Objective:     Physical Exam Filed Vitals:   01/13/12 1023  BP: 102/60  Pulse: 113  Temp: 98.6 F (37 C)  Resp: 18   O2 sat 92% RA  Gen'l- WNWD woman in acute respiratory distress with wheezing HEENT - [place]/AT, C&S clear Neck - no retractions Cor - RRR PUlm - decreased overall breathsound, inspiratory and expiratory wheezing, mild increased WOB - after nebulizer treatment and solumedrol 125mg  IM at 20 minutes decreased wheezing but still present and improved respirations. Neuro - A&O x 3.          Assessment & Plan:

## 2012-01-13 NOTE — Assessment & Plan Note (Addendum)
Patient with acute flare of asthnma that did respond to nebulizer treatment and steroids.  Plan - prednisone 30 mg bid x 2, 40 mg daily x 3, 20 mg daily x 3, 10 mg daily x 6           Albuterol MDI with inspire-ez two puffs every 6 hrs           Cautioned that if she needs more than 4 MDI treatments/24 she needs to be seen.  Addendum: she returns 4 hrs later - recurrent wheezing and tightness. She is admitted to hospital.

## 2012-01-13 NOTE — H&P (Signed)
Deanna Schwartz is an 31 y.o. female.   Chief Complaint: Shortness of breath and wheezing despite home use of MDI, office treatment with nebs and IM solumedrol HPI: Ms. Sanville presents acutely with cough and wheezing. She has had a cold for several days but started having marked wheezing in the last 24 hours. She did use her MDI/albuterol and did use a nebulizer last night with minimal relief. She has not had any fever or productive cough. She is not fatiguing. O2 sat on presentation to the office is 92% on Room Air. She reports that she has been hospitalized for asthma in '05 and she felt about like she feels now. She reports that she has not had an asthma attack for several years.    Past Medical History  Diagnosis Date  . Asthma   . Anxiety   . GERD (gastroesophageal reflux disease)   . Allergic rhinitis   . Migraine   . Chronic constipation   . Anemia   . H. pylori infection 2008    Hx of tx + serology  . Panic attack   . Irritable bowel syndrome     Past Surgical History  Procedure Date  . Nasal sinus surgery   . Flexible sigmoidoscopy 04/14/2008    normal    Family History  Problem Relation Age of Onset  . Diabetes      grandmother  . Hypertension    . Colon cancer Neg Hx    Social History:  reports that she has never smoked. She has never used smokeless tobacco. She reports that she drinks alcohol. She reports that she does not use illicit drugs.  Allergies:  Allergies  Allergen Reactions  . Cephalexin     REACTION: nausea  . Iodine     Current Facility-Administered Medications on File Prior to Encounter  Medication Dose Route Frequency Provider Last Rate Last Dose  . methylPREDNISolone sodium succinate (SOLU-MEDROL) 125 mg/2 mL injection 125 mg  125 mg Intramuscular Once Jacques Navy, MD       Current Outpatient Prescriptions on File Prior to Encounter  Medication Sig Dispense Refill  . albuterol (PROVENTIL HFA;VENTOLIN HFA) 108 (90 BASE) MCG/ACT inhaler  Inhale 2 puffs into the lungs every 6 (six) hours as needed for wheezing.  1 Inhaler  0  . cetirizine-pseudoephedrine (ZYRTEC-D) 5-120 MG per tablet Take 1 tablet by mouth 2 (two) times daily.        . cyanocobalamin ([facility] VITAMIN B12) 1000 MCG tablet Take 1 tablet (1,000 mcg total) by mouth daily.  100 tablet  2  . dexlansoprazole (DEXILANT) 60 MG capsule Take 60 mg by mouth daily as needed. For gerd      . ondansetron (ZOFRAN) 4 MG tablet Take 1 tab every 4-6 hours as needed for nausea.  20 tablet  1  . polyethylene glycol powder (MIRALAX) powder Take 17 g by mouth daily.  527 g  6  . predniSONE (DELTASONE) 20 MG tablet Take 0.5 tablets (10 mg total) by mouth daily. 3 tabs bid x 2, 4 tabs qd x 3, 2 tabs qd x 3, 1 tab qd x 6  36 tablet  0  . Spacer/Aero-Holding [facility]) MISC 1 each by Other route once.  1 each  0        Review of Systems  Constitutional: Negative for fever, chills, weight loss and malaise/fatigue.  HENT: Negative.   Eyes: Negative.   Respiratory: Positive for cough, shortness of breath and wheezing.   Cardiovascular:  Negative.   Gastrointestinal: Negative.   Genitourinary: Negative.   Musculoskeletal: Negative.   Skin: Negative.   Neurological: Negative.  Negative for weakness.  Endo/Heme/Allergies: Negative.   Psychiatric/Behavioral: Negative.     Last menstrual period 12/14/2011. Physical Exam  98.6   102/60   113   18   Sat % 92 General - WNWD latino woman in respiratory distress HEENT- [place].AT, throat clear Neck - supple, no retractions Cor - regular tachycardia Pulm - inspiratory and expiratory wheezing, coarse rhonchi, mild increased WOB Abd- soft, BS+ Genitalia/rectal - deferred Neuro - A&O x 3 Assessment/Plan 1. PUlm - asthma exacerbation w/o evidence of infection having failed outpatient therapy  Plan Med/surg admit  IV solumedrol 80 mg q 6  Oxygen 2l [place]  Albuterol nebs q 6 and q 2 prn  General supportive care.    Illene Regulus 01/13/2012, 12:26 PM

## 2012-01-14 MED ORDER — LORAZEPAM 0.5 MG PO TABS: 0.5000 mg | ORAL_TABLET | Freq: Four times a day (QID) | ORAL | Status: DC | PRN

## 2012-01-14 MED ORDER — METHYLPREDNISOLONE SODIUM SUCC 125 MG IJ SOLR
80.0000 mg | Freq: Two times a day (BID) | INTRAMUSCULAR | Status: DC
  Administered 2012-01-15 – 2012-01-16 (×3): 80 mg via INTRAVENOUS
  Filled 2012-01-14 (×5): qty 1.28

## 2012-01-14 NOTE — Progress Notes (Signed)
Subjective: Feels a  Little anxious and wound up due to the high dose steroids. She is still feeling tight in the chest and can really tell a difference when she is off oxygen.  Objective: Lab: Lab Results  Component Value Date   WBC 9.1 01/13/2012   HGB 12.0 01/13/2012   HCT 37.6 01/13/2012   MCV 80.9 01/13/2012   PLT 263 01/13/2012   BMET    Component Value Date/Time   NA 139 01/13/2012 1323   K 3.6 01/13/2012 1323   CL 103 01/13/2012 1323   CO2 25 01/13/2012 1323   GLUCOSE 120* 01/13/2012 1323   GLUCOSE 97 07/27/2006 1219   BUN 8 01/13/2012 1323   CREATININE 0.67 01/13/2012 1323   CALCIUM 9.4 01/13/2012 1323   GFRNONAA >90 01/13/2012 1323   GFRAA >90 01/13/2012 1323      Imaging: cxr 5/7 Clinical Data: Wheezing. Rule out pneumonia.  CHEST - 2 VIEW  Comparison: 08/27/2011  Findings: The heart size and mediastinal contours are within normal  limits. Both lungs are clear. The visualized skeletal structures  are unremarkable.  IMPRESSION:  Negative examination.    Physical Exam: Filed Vitals:   01/14/12 0517  BP: 100/66  Pulse: 96  Temp: 98.5 F (36.9 C)  Resp: 18   Gen'l -sitting up, dressed. Appears a little hyper Cor - regular tachycarida Pulm - prolonged expiratory phase, diffuse coarse wheezing. No increased WOB    Assessment/Plan: 1. Pul/asthma - still very tight and wheezy  Plan - continue present dose of steroids  Add ativan 0.5 mg q 6 prn   Illene Regulus 01/14/2012, 8:27 AM

## 2012-01-15 NOTE — Progress Notes (Signed)
Subjective: Sitting in bed taking a breathing treatment. Does feel better - she feels she is not in danger!  Objective: Lab: No new lab Imaging: No new imaging  Physical Exam: Filed Vitals:   01/15/12 0450  BP: 97/50  Pulse: 67  Temp: 98.1 F (36.7 C)  Resp: 16   WNWD Hispanic woman sitting up in bed Cor - RRR Pulm - no increased WOB, normal expiratory phase, mild wheezing. Good sats     Assessment/Plan: 1.Pul/Asthma - making good progress  Plan - solumedrol q 12 today  For d/c in AM on prednisone taper.    Deanna Schwartz Needle Deanna Schwartz 01/15/2012, 8:19 AM

## 2012-01-16 LAB — GLUCOSE, CAPILLARY: Glucose-Capillary: 101 mg/dL — ABNORMAL HIGH (ref 70–99)

## 2012-01-16 NOTE — Progress Notes (Signed)
Doing much better. Ready for d/c home to complete a steroid burst and taper. To come to the office Monday for follow-up check.   Dictated [id number]

## 2012-01-17 NOTE — Discharge Summary (Signed)
[name], [name]           ACCOUNT NO.:  [id number]  MEDICAL RECORD NO.:  [id number]  LOCATION:  1317                         FACILITY:  [facility]  PHYSICIAN:  Rosalyn Gess. Kynnedy Carreno, MD  DATE OF BIRTH:  02-Nov-1980  DATE OF ADMISSION:  01/13/2012 DATE OF DISCHARGE:  01/16/2012                              DISCHARGE SUMMARY   ADMITTING DIAGNOSIS:  Asthmatic attack, refractory to outpatient treatment.  DISCHARGE DIAGNOSIS:  Asthmatic attack, refractory to outpatient treatment.  CONSULTANTS:  None.  PROCEDURES:  Chest x-ray at admission read out as a negative examination with both lungs being clear.  HISTORY OF PRESENT ILLNESS:  Ms. Deanna Schwartz is a 31 year old woman with a history of asthma.  She does not have symptoms very often and has not had a major flare for several years.  She presented to the office on the day of admission complaining of acute cough and wheezing.  She had had a cold for several days, but then developed significant wheezing in the last 24 hours.  She did use her albuterol metered dose inhaler without significant relief.  She was seen in the office, given a breathing treatment, and was then started on oral prednisone after being given IM Solu-Medrol.  She returned downstairs 4 hours later reporting that her symptoms were worse.  Oxygen saturation in the office on room air was 92%.  She had increased work of breathing, significant diffuse wheezing and she was getting fatigued.  She was subsequently admitted for IV Solu- Medrol and oxygen support.  Please see the H and P and other epic notes for past medical history, family history and social history.  MEDICATIONS:  Prior to admission included albuterol MDI 2 puffs q.6h p.r.n., Zyrtec-D 2 times daily, oral B12 of 1000 mcg daily, Dexalone 60 mg daily, Zofran 4 mg p.r.n., MiraLAX 17 g by mouth daily.  She also had been given a prescription for a prednisone burst and taper.  She was also instructed to use a  spire-ease with her metered dose inhaler when needed.  PHYSICAL EXAMINATION:  VITAL SIGNS:  On admission, the patient was afebrile blood pressure was 102/60, heart rate was 113, oxygen saturation was 92%.  GENERAL APPEARANCE:  A well-nourished, mildly overweight woman in no acute distress.  HEENT:  Exam was unremarkable. NECK:  Supple.  CHEST:  Without deformity.  PULMONARY:  Exam revealed diffuse inspiratory and expiratory wheezing, coarse rhonchi, mild increased work of breathing with use of accessory musculature. CARDIOVASCULAR:  Exam revealed 2+ radial pulse, regular tachycardia. NEUROLOGIC:  Exam was nonfocal.  HOSPITAL COURSE:  The patient was admitted to a med-surg bed.  She was started on IV Solu-Medrol 80 mg q.8h.  After 24 hours, with improvement in her symptoms, she was reduced to 80 mg of IV Solu-Medrol q.12h.  She continued to do well on this regimen with her work of breathing having resolved, wheezing was much improved, although still persistent.  At this point, with her not requiring oxygen, with good O2 saturation on room air, with no significant increased work of breathing, and with much improved wheezing she is ready for discharge home to continue an oral steroid taper.  She also will continue to use albuterol metered-dose inhaler  with spire-ease spacer as needed with clear instructions not to exceed 3 treatments daily and if she needs more than that she is to seek medical attention.  Discharge examination, temperature was 98.2, blood pressure 112/76, pulse was 82, respirations 16, oxygen saturation on room air was 98%. General appearance, this is an overweight woman sitting in bed in no acute distress.  Chest, the patient is moving air well.  She does have expiratory wheezing that is mild, no rales.  Cardiovascular revealed a regular rate and rhythm.  DISPOSITION:  The patient is discharged to home.  She may resume work on Monday the 13th.  We will complete her  FML-2 form.  CONDITION ON DISCHARGE:  The patient's condition at the time of discharge dictation is stable and improved.     Rosalyn Gess Janyth Riera, MD     MEN/MEDQ  D:  01/16/2012  T:  01/17/2012  Job:  [id number]

## 2012-01-19 ENCOUNTER — Other Ambulatory Visit: Payer: Self-pay

## 2012-01-19 ENCOUNTER — Encounter: Payer: Self-pay | Admitting: Internal Medicine

## 2012-01-19 ENCOUNTER — Ambulatory Visit (INDEPENDENT_AMBULATORY_CARE_PROVIDER_SITE_OTHER): Payer: 59 | Admitting: Internal Medicine

## 2012-01-19 VITALS — BP 110/74 | HR 72 | Temp 97.3°F | Resp 16 | Wt 188.0 lb

## 2012-01-19 DIAGNOSIS — J45909 Unspecified asthma, uncomplicated: Secondary | ICD-10-CM

## 2012-01-19 MED ORDER — PREDNISONE 10 MG PO TABS: 10.0000 mg | ORAL_TABLET | Freq: Every day | ORAL | Status: DC

## 2012-01-19 MED ORDER — BIS SUBCIT-METRONID-TETRACYC 140-125-125 MG PO CAPS: ORAL_CAPSULE | ORAL | Status: DC

## 2012-01-19 NOTE — Progress Notes (Signed)
Quick Note:  Office -   Es has H. Pylori Please let her know  Would like for her to take Pylera (? If we can get samples)  Not an emergency, may need to get over asthma flare more first  [facility] - no letter or recall ______

## 2012-01-19 NOTE — Assessment & Plan Note (Signed)
Doing much better.  Plan - finish steroid burst and taper           Benadryl for sleep           May return to work. FMLA form completed and provided to patient

## 2012-01-19 NOTE — Progress Notes (Signed)
  Subjective:    Patient ID: Deanna Schwartz, female    DOB: 27-Oct-1980, 31 y.o.   MRN: [id number]  HPI Post-hospital visit: admitted May 7- 11, 2013 for exacerbation of asthma that required IV steroids. She is doing better but has not been sleeping due to the steroids. She is finishing a burst and taper of prednisone. Her breathing is better.  PMH, FamHx and SocHx reviewed for any changes and relevance.    Review of Systems System review is negative for any constitutional, cardiac, pulmonary, GI or neuro symptoms or complaints other than as described in the HPI.     Objective:   Physical Exam Filed Vitals:   01/19/12 1616  BP: 110/74  Pulse: 72  Temp: 97.3 F (36.3 C)  Resp: 16   O2 sat 98%  Chest - feint end-expiratory wheezing. Neuro - a bit jumpy       Assessment & Plan:

## 2012-01-19 NOTE — Patient Instructions (Signed)
Asthma attack - doing much better!! Finish prednisone burst and taper. For sleep try taking benadryl (generic) 25 mg at bedtime. OK to return to work.

## 2012-01-21 ENCOUNTER — Other Ambulatory Visit: Payer: Self-pay

## 2012-01-21 MED ORDER — DEXLANSOPRAZOLE 60 MG PO CPDR: 60.0000 mg | DELAYED_RELEASE_CAPSULE | Freq: Every day | ORAL | Status: DC | PRN

## 2012-03-31 ENCOUNTER — Other Ambulatory Visit: Payer: Self-pay | Admitting: Obstetrics and Gynecology

## 2012-03-31 DIAGNOSIS — N63 Unspecified lump in unspecified breast: Secondary | ICD-10-CM

## 2012-03-31 DIAGNOSIS — N644 Mastodynia: Secondary | ICD-10-CM

## 2012-04-05 ENCOUNTER — Other Ambulatory Visit (INDEPENDENT_AMBULATORY_CARE_PROVIDER_SITE_OTHER): Payer: 59

## 2012-04-05 DIAGNOSIS — E538 Deficiency of other specified B group vitamins: Secondary | ICD-10-CM

## 2012-04-05 DIAGNOSIS — D649 Anemia, unspecified: Secondary | ICD-10-CM

## 2012-04-05 LAB — CBC WITH DIFFERENTIAL/PLATELET
Basophils Absolute: 0 10*3/uL (ref 0.0–0.1)
Eosinophils Absolute: 1 10*3/uL — ABNORMAL HIGH (ref 0.0–0.7)
Hemoglobin: 11.9 g/dL — ABNORMAL LOW (ref 12.0–15.0)
Lymphocytes Relative: 25.8 % (ref 12.0–46.0)
Monocytes Relative: 5.8 % (ref 3.0–12.0)
Neutro Abs: 3.9 10*3/uL (ref 1.4–7.7)
Neutrophils Relative %: 54.1 % (ref 43.0–77.0)
RBC: 4.49 Mil/uL (ref 3.87–5.11)
RDW: 15.4 % — ABNORMAL HIGH (ref 11.5–14.6)

## 2012-04-05 LAB — VITAMIN B12: Vitamin B-12: 323 pg/mL (ref 211–911)

## 2012-04-06 ENCOUNTER — Ambulatory Visit
Admission: RE | Admit: 2012-04-06 | Discharge: 2012-04-06 | Disposition: A | Discharge status: Home / Self Care | Payer: 59 | Source: Ambulatory Visit | Attending: Obstetrics and Gynecology | Admitting: Obstetrics and Gynecology

## 2012-04-06 DIAGNOSIS — N644 Mastodynia: Secondary | ICD-10-CM

## 2012-04-06 DIAGNOSIS — N63 Unspecified lump in unspecified breast: Secondary | ICD-10-CM

## 2012-04-06 LAB — INTRINSIC FACTOR ANTIBODIES: Intrinsic Factor: NEGATIVE

## 2012-04-07 ENCOUNTER — Other Ambulatory Visit: Payer: Self-pay

## 2012-04-07 MED ORDER — HEMAX 150-1 MG PO TABS: 150.0000 mg | ORAL_TABLET | Freq: Every day | ORAL | Status: DC

## 2012-04-07 NOTE — Progress Notes (Signed)
Pt given samples on Hemax per Dr. Leone Payor and told to continue her Dexilant.

## 2012-04-08 ENCOUNTER — Other Ambulatory Visit: Payer: Self-pay | Admitting: Internal Medicine

## 2012-04-08 LAB — ANTI-PARIETAL ANTIBODY: Parietal Cell Antibody-IgG: POSITIVE — AB

## 2012-04-16 ENCOUNTER — Other Ambulatory Visit: Payer: Self-pay

## 2012-04-16 MED ORDER — DEXLANSOPRAZOLE 60 MG PO CPDR: 60.0000 mg | DELAYED_RELEASE_CAPSULE | Freq: Every day | ORAL | Status: DC

## 2012-04-21 ENCOUNTER — Encounter: Payer: Self-pay | Admitting: Internal Medicine

## 2012-04-21 ENCOUNTER — Ambulatory Visit (INDEPENDENT_AMBULATORY_CARE_PROVIDER_SITE_OTHER): Payer: 59 | Admitting: Internal Medicine

## 2012-04-21 ENCOUNTER — Other Ambulatory Visit (INDEPENDENT_AMBULATORY_CARE_PROVIDER_SITE_OTHER): Payer: 59

## 2012-04-21 VITALS — BP 100/76 | HR 91 | Temp 98.6°F | Wt 197.0 lb

## 2012-04-21 DIAGNOSIS — R7309 Other abnormal glucose: Secondary | ICD-10-CM

## 2012-04-21 DIAGNOSIS — J309 Allergic rhinitis, unspecified: Secondary | ICD-10-CM

## 2012-04-21 DIAGNOSIS — J45909 Unspecified asthma, uncomplicated: Secondary | ICD-10-CM

## 2012-04-21 DIAGNOSIS — E538 Deficiency of other specified B group vitamins: Secondary | ICD-10-CM

## 2012-04-21 DIAGNOSIS — R631 Polydipsia: Secondary | ICD-10-CM

## 2012-04-21 DIAGNOSIS — R350 Frequency of micturition: Secondary | ICD-10-CM | POA: Insufficient documentation

## 2012-04-21 DIAGNOSIS — R739 Hyperglycemia, unspecified: Secondary | ICD-10-CM

## 2012-04-21 DIAGNOSIS — D649 Anemia, unspecified: Secondary | ICD-10-CM

## 2012-04-21 DIAGNOSIS — E669 Obesity, unspecified: Secondary | ICD-10-CM

## 2012-04-21 LAB — IBC PANEL
Iron: 39 ug/dL — ABNORMAL LOW (ref 42–145)
Transferrin: 315.3 mg/dL (ref 212.0–360.0)

## 2012-04-21 LAB — CBC WITH DIFFERENTIAL/PLATELET
Basophils Absolute: 0 10*3/uL (ref 0.0–0.1)
Eosinophils Relative: 9.3 % — ABNORMAL HIGH (ref 0.0–5.0)
HCT: 36.5 % (ref 36.0–46.0)
Lymphocytes Relative: 32.9 % (ref 12.0–46.0)
Monocytes Relative: 7.1 % (ref 3.0–12.0)
Neutrophils Relative %: 50 % (ref 43.0–77.0)
Platelets: 240 10*3/uL (ref 150.0–400.0)
RDW: 14.5 % (ref 11.5–14.6)
WBC: 6.7 10*3/uL (ref 4.5–10.5)

## 2012-04-21 LAB — BASIC METABOLIC PANEL
BUN: 17 mg/dL (ref 6–23)
Calcium: 8.9 mg/dL (ref 8.4–10.5)
Chloride: 104 mEq/L (ref 96–112)
Creatinine, Ser: 0.7 mg/dL (ref 0.4–1.2)
GFR: 111.63 mL/min (ref >60.00)

## 2012-04-21 LAB — URINALYSIS
Bilirubin Urine: NEGATIVE
Ketones, ur: NEGATIVE
Leukocytes, UA: NEGATIVE
Specific Gravity, Urine: 1.01 (ref 1.000–1.030)
Urine Glucose: NEGATIVE
Urobilinogen, UA: 0.2 (ref 0.0–1.0)

## 2012-04-21 LAB — LIPID PANEL
HDL: 49.7 mg/dL (ref >39.00)
Total CHOL/HDL Ratio: 3
Triglycerides: 158 mg/dL — ABNORMAL HIGH (ref 0.0–149.0)

## 2012-04-21 LAB — HEPATIC FUNCTION PANEL
ALT: 12 U/L (ref 0–35)
Albumin: 3.7 g/dL (ref 3.5–5.2)
Alkaline Phosphatase: 49 U/L (ref 39–117)
Bilirubin, Direct: 0.1 mg/dL (ref 0.0–0.3)
Total Protein: 7.4 g/dL (ref 6.0–8.3)

## 2012-04-21 LAB — TSH: TSH: 1.6 u[IU]/mL (ref 0.35–5.50)

## 2012-04-21 LAB — HEMOGLOBIN A1C: Hgb A1c MFr Bld: 5.2 % (ref 4.6–6.5)

## 2012-04-21 LAB — VITAMIN B12: Vitamin B-12: 225 pg/mL (ref 211–911)

## 2012-04-21 MED ORDER — CETIRIZINE-PSEUDOEPHEDRINE ER 5-120 MG PO TB12: 1.0000 | ORAL_TABLET | Freq: Two times a day (BID) | ORAL | Status: DC

## 2012-04-21 MED ORDER — AZELASTINE-FLUTICASONE 137-50 MCG/ACT NA SUSP: 1.0000 | NASAL | Status: DC

## 2012-04-21 MED ORDER — DEXLANSOPRAZOLE 60 MG PO CPDR: 60.0000 mg | DELAYED_RELEASE_CAPSULE | Freq: Every day | ORAL | Status: DC

## 2012-04-21 MED ORDER — ALBUTEROL SULFATE HFA 108 (90 BASE) MCG/ACT IN AERS: 2.0000 | INHALATION_SPRAY | Freq: Four times a day (QID) | RESPIRATORY_TRACT | Status: DC | PRN

## 2012-04-21 NOTE — Patient Instructions (Signed)
[email or web address] 

## 2012-04-21 NOTE — Progress Notes (Signed)
  Subjective:    Patient ID: Deanna Schwartz, female    DOB: 1981-06-13, 31 y.o.   MRN: [id number]  HPI  C/o dry mouth and freq urination 3 wks F/u asthma  Review of Systems  Constitutional: Positive for unexpected weight change. Negative for chills, activity change, appetite change and fatigue.  HENT: Negative for congestion, mouth sores and sinus pressure.   Eyes: Negative for visual disturbance.  Respiratory: Positive for wheezing (rare). Negative for cough and chest tightness.   Gastrointestinal: Negative for nausea and abdominal pain.  Genitourinary: Positive for frequency. Negative for difficulty urinating and vaginal pain.  Musculoskeletal: Negative for back pain and gait problem.  Skin: Negative for pallor and rash.  Neurological: Negative for dizziness, tremors, weakness, numbness and headaches.  Psychiatric/Behavioral: Negative for suicidal ideas, confusion and disturbed wake/sleep cycle.   Wt Readings from Last 3 Encounters:  04/21/12 197 lb (89.359 kg)  01/19/12 188 lb (85.276 kg)  01/13/12 188 lb (85.276 kg)   BP Readings from Last 3 Encounters:  04/21/12 100/76  01/19/12 110/74  01/16/12 112/76         Objective:   Physical Exam  Constitutional: She appears well-developed. No distress.       Obese   HENT:  Head: Normocephalic.  Right Ear: External ear normal.  Left Ear: External ear normal.  Nose: Nose normal.  Mouth/Throat: Oropharynx is clear and moist.       Swollen nasal mucosa  Eyes: Conjunctivae are normal. Pupils are equal, round, and reactive to light. Right eye exhibits no discharge. Left eye exhibits no discharge.  Neck: Normal range of motion. Neck supple. No JVD present. No tracheal deviation present. No thyromegaly present.  Cardiovascular: Normal rate, regular rhythm and normal heart sounds.   Pulmonary/Chest: No stridor. No respiratory distress. She has no wheezes. She has no rales.  Abdominal: Soft. Bowel sounds are normal. She exhibits  no distension and no mass. There is no tenderness. There is no rebound and no guarding.  Musculoskeletal: She exhibits no edema and no tenderness.  Lymphadenopathy:    She has no cervical adenopathy.  Neurological: She displays normal reflexes. No cranial nerve deficit. She exhibits normal muscle tone. Coordination normal.  Skin: No rash noted. No erythema.  Psychiatric: She has a normal mood and affect. Her behavior is normal. Judgment and thought content normal.    Lab Results  Component Value Date   WBC 7.2 04/05/2012   HGB 11.9* 04/05/2012   HCT 36.7 04/05/2012   PLT 226.0 04/05/2012   GLUCOSE 120* 01/13/2012   CHOL 147 10/04/2010   TRIG 50.0 10/04/2010   HDL 53.50 10/04/2010   LDLCALC 84 10/04/2010   ALT 14 08/27/2011   AST 23 08/27/2011   NA 139 01/13/2012   K 3.6 01/13/2012   CL 103 01/13/2012   CREATININE 0.67 01/13/2012   BUN 8 01/13/2012   CO2 25 01/13/2012   TSH 1.29 03/24/2011         Assessment & Plan:

## 2012-04-21 NOTE — Assessment & Plan Note (Signed)
Zyr D

## 2012-04-21 NOTE — Assessment & Plan Note (Signed)
Labs

## 2012-04-21 NOTE — Assessment & Plan Note (Signed)
UA

## 2012-04-21 NOTE — Assessment & Plan Note (Signed)
Continue with current prescription therapy as reflected on the Med list.  

## 2012-04-21 NOTE — Assessment & Plan Note (Signed)
Labs  Continue with current prescription therapy as reflected on the Med list.  

## 2012-04-21 NOTE — Assessment & Plan Note (Signed)
A1c Wt loss is needed Nutr cons

## 2012-04-22 ENCOUNTER — Telehealth: Payer: Self-pay | Admitting: Internal Medicine

## 2012-04-22 MED ORDER — VITAMIN B-12 1000 MCG SL SUBL: 1.0000 | SUBLINGUAL_TABLET | Freq: Every day | SUBLINGUAL | Status: DC

## 2012-04-22 NOTE — Telephone Encounter (Signed)
Deanna Schwartz, please, inform patient that all labs are normal except for low iron and low vit B12 Cont iron Start SL vit B12 - emailed Thx

## 2012-04-23 NOTE — Telephone Encounter (Signed)
Left message for pt to callback office.  

## 2012-04-23 NOTE — Telephone Encounter (Signed)
Pt advised via personal VM 

## 2012-04-29 ENCOUNTER — Encounter: Payer: Self-pay | Admitting: *Deleted

## 2012-04-29 ENCOUNTER — Encounter: Payer: 59 | Attending: Internal Medicine | Admitting: *Deleted

## 2012-04-29 DIAGNOSIS — E669 Obesity, unspecified: Secondary | ICD-10-CM | POA: Insufficient documentation

## 2012-04-29 DIAGNOSIS — Z713 Dietary counseling and surveillance: Secondary | ICD-10-CM | POA: Insufficient documentation

## 2012-04-29 NOTE — Progress Notes (Signed)
  Medical Nutrition Therapy:  Appt start time: 0800 end time:  0900.  Assessment:  Primary concerns today: patient here for obesity. She works at [employer] 40 hours a week. She lives with her husband and 3 young children. She states she rarely eats out, prepares good variety of healthy foods at home and exercises by taking children to local school to play while she walks the track for 30 minutes twice a week.  MEDICATIONS: see list   DIETARY INTAKE:  Usual eating pattern includes 3 meals and 0-2 snacks per day.  Everyday foods include good variety of all food groups.  Avoided foods include fried foods and sweets.    24-hr recall:  B ( AM):regular with cinnamon and milk OR maple and brown sugar with water oatmeal and 2 whole grain toast plain and coffee with milk Snk ( AM): none  L ( PM): catered in or office orders out: meat, starch vegetable meal OR sandwich OR goes home: [place] and cheese sandwich, water or aloe vera juice Snk ( PM): none except coffee D ( PM): self prepares: rice, beans, lean meat, vegetables, water Snk ( PM): none except coffee Beverages: coffee, water, aloe vera juice  Usual physical activity: plays outside with kids at park on weekends, walks track at local school while kids play right after work for 30 minutes  Estimated energy needs: 1400 calories 158 g carbohydrates 105 g protein 39 g fat  Progress Towards Goal(s):  In progress.   Nutritional Diagnosis:  NI-1.5 Excessive energy intake As related to activity level.  As evidenced by BMI of 33.0 .    Intervention:  Nutrition counseling for weight loss initiated. Discussed calorie content of macro-nutrients, taught Carb Counting as method for understanding calorie content of foods eaten and suggested reading food labels as well as using APPS to provide nutrition info of foods. Also explained rationale of increasing her activity level in a variety of ways to 5 days a week to provide weight  loss. Plan: Aim for 2-3 Carb Choices (30-45 grams) per meal Aim for for 0-1 Carb /snack if hungry Consider reading food labels for total carbohydrate (and fat grams) of foods Consider using Food APP on phone for more nutrition information to assess carb content of foods Continue walking on track with kids after work, consider at least 3 nights a week Consider other forms of activity such as dancing or the 7 Minute Workout APP on other days.  Handouts given during visit include: Carb Counting and Food Label handouts APP information for Calorie Brooke Dare, Carbs Control and The 7 Minute Workout  Monitoring/Evaluation:  Dietary intake, exercise, reading food labels, and body weight in 6 week(s).

## 2012-04-29 NOTE — Patient Instructions (Addendum)
Plan: Aim for 2-3 Carb Choices (30-45 grams) per meal Aim for for 0-1 Carb /snack if hungry Consider reading food labels for total carbohydrate (and fat grams) of foods Consider using Food APP on phone for more nutrition information to assess carb content of foods Continue walking on track with kids after work, consider at least 3 nights a week Consider other forms of activity such as dancing or the 7 Minute Workout APP on other days.

## 2012-05-13 ENCOUNTER — Other Ambulatory Visit: Payer: Self-pay

## 2012-05-13 ENCOUNTER — Encounter: Payer: Self-pay | Admitting: Internal Medicine

## 2012-05-13 DIAGNOSIS — E538 Deficiency of other specified B group vitamins: Secondary | ICD-10-CM

## 2012-05-13 MED ORDER — CYANOCOBALAMIN 1000 MCG/ML IJ SOLN: 1000.0000 ug | Freq: Once | INTRAMUSCULAR | Status: DC

## 2012-05-13 NOTE — Progress Notes (Signed)
Quick Note:  Her B12 level is borderline again VITAMINB12 225 04/21/2012  She has + anti-parietal cell Ab's  Dr. Posey Rea ordered sublingual B12 which will help but lets give her 1000ug IM B12 x 1  Recheck B12 level in 3 months She may need regular injections ______

## 2012-05-14 ENCOUNTER — Ambulatory Visit (INDEPENDENT_AMBULATORY_CARE_PROVIDER_SITE_OTHER): Payer: 59 | Admitting: Internal Medicine

## 2012-05-14 DIAGNOSIS — E538 Deficiency of other specified B group vitamins: Secondary | ICD-10-CM

## 2012-05-14 MED ORDER — CYANOCOBALAMIN 1000 MCG/ML IJ SOLN
1000.0000 ug | Freq: Once | INTRAMUSCULAR | Status: AC
  Administered 2012-05-14: 1000 ug via INTRAMUSCULAR

## 2012-05-19 ENCOUNTER — Ambulatory Visit: Payer: 59 | Admitting: *Deleted

## 2012-05-20 ENCOUNTER — Ambulatory Visit (INDEPENDENT_AMBULATORY_CARE_PROVIDER_SITE_OTHER): Payer: 59 | Admitting: Otolaryngology

## 2012-05-20 DIAGNOSIS — J310 Chronic rhinitis: Secondary | ICD-10-CM

## 2012-05-20 DIAGNOSIS — R040 Epistaxis: Secondary | ICD-10-CM

## 2012-05-28 ENCOUNTER — Ambulatory Visit: Payer: 59

## 2012-06-04 ENCOUNTER — Ambulatory Visit: Payer: 59 | Attending: Orthopaedic Surgery

## 2012-06-04 DIAGNOSIS — M25569 Pain in unspecified knee: Secondary | ICD-10-CM | POA: Insufficient documentation

## 2012-06-04 DIAGNOSIS — IMO0001 Reserved for inherently not codable concepts without codable children: Secondary | ICD-10-CM | POA: Insufficient documentation

## 2012-06-04 DIAGNOSIS — M25669 Stiffness of unspecified knee, not elsewhere classified: Secondary | ICD-10-CM | POA: Insufficient documentation

## 2012-06-04 DIAGNOSIS — R262 Difficulty in walking, not elsewhere classified: Secondary | ICD-10-CM | POA: Insufficient documentation

## 2012-06-10 ENCOUNTER — Ambulatory Visit: Payer: 59 | Admitting: *Deleted

## 2012-06-10 ENCOUNTER — Encounter: Payer: 59 | Admitting: Rehabilitation

## 2012-07-15 ENCOUNTER — Emergency Department (INDEPENDENT_AMBULATORY_CARE_PROVIDER_SITE_OTHER)
Admission: EM | Admit: 2012-07-15 | Discharge: 2012-07-15 | Disposition: A | Discharge status: Home / Self Care | Payer: Self-pay | Source: Home / Self Care | Attending: Emergency Medicine | Admitting: Emergency Medicine

## 2012-07-15 ENCOUNTER — Encounter (HOSPITAL_COMMUNITY): Payer: Self-pay | Admitting: *Deleted

## 2012-07-15 DIAGNOSIS — R002 Palpitations: Secondary | ICD-10-CM

## 2012-07-15 LAB — POCT I-STAT, CHEM 8
BUN: 9 mg/dL (ref 6–23)
Calcium, Ion: 1.25 mmol/L — ABNORMAL HIGH (ref 1.12–1.23)
Chloride: 103 meq/L (ref 96–112)
Creatinine, Ser: 0.8 mg/dL (ref 0.50–1.10)
Glucose, Bld: 83 mg/dL (ref 70–99)
HCT: 40 % (ref 36.0–46.0)
Hemoglobin: 13.6 g/dL (ref 12.0–15.0)
Potassium: 3.7 meq/L (ref 3.5–5.1)
Sodium: 141 meq/L (ref 135–145)
TCO2: 25 mmol/L (ref 0–100)

## 2012-07-15 LAB — TSH: TSH: 1.279 u[IU]/mL (ref 0.350–4.500)

## 2012-07-15 NOTE — ED Notes (Signed)
C/o sharp substernal chest pain for a couple seconds while @ work @ 1030.  It feels like her heart skips a beat every 10 min. since then.   When she breathes she can feel it skipping.  C/o SOB, no nausea or sweating.  She had a rush of heat when she was driving.

## 2012-07-15 NOTE — ED Provider Notes (Signed)
Chief Complaint  Patient presents with  . Chest Pain    History of Present Illness:   The patient is a 31 year old female who has had a three-day history of a sensation of her heart fluttering. She states that it feels as if her heart skips a beat and this can occur about once a minute. It's worse if she stands upright and better if she is sitting down or lying down. Today she had an episode of rapid heartbeat around 2:20 PM. This lasted 5-10 seconds. It was associated with a sharp subxiphoid chest pain without radiation and a sensation of a hot flush. She felt that she couldn't breathe well. The symptoms went away completely in 5-10 seconds. She's never had anything like this before or since. She saw Dr. Dietrich Pates for palpitations about 2 years ago and had an echocardiogram. She was diagnosed as having anxiety attacks. She denies being nervous or anxious. She's had no syncope or presyncope. She denies any thyroid history or history of valvular heart disease. She's been taking Zyrtec-D and an herbal bee pollen extract for weight loss. She also has an albuterol inhaler for asthma but has not taken this recently. She is allergic to cephalexin.  Review of Systems:  Other than noted above, the patient denies any of the following symptoms. Systemic:  No fever, chills, or fatigue. Pulmonary:  No cough, wheezing, shortness of breath. Cardiac:  No chest pain, tightness, pressure, dizziness, presyncope, syncope, PND, orthopnea, or edema. Ext:  No leg pain or swelling. Neuro:  No weakness, paresthesias, or difficulty with speech or gait. Psych:  No anxiety or depression. Endo:  No weight loss, tremor, sweats, or heat intolerance.  PMFSH:  Past medical history, family history, social history, meds, and allergies were reviewed and updated as needed. No history of cardiac disease.  No history of excessive alcohol intake.  Physical Exam:   Vital signs:  BP 111/78  Pulse 91  Temp 98.7 F (37.1 C) (Oral)   Resp 16  SpO2 100%  LMP 06/28/2012 Gen:  Alert, oriented, in no distress, skin warm and dry. Eye:  PERRL, lids and conjunctivas normal.  No stare or lid lag. ENT:  Mucous membranes moist, pharynx clear. Neck:  Supple, no adenopathy or tenderness.  No JVD.  Thyroid not enlarged. Lungs:  Clear to auscultation, no wheezes, rales or rhonchi.  No respiratory distress. Heart:  Regular rhythm, no extrasystoles.  No gallops, murmers, clicks or rubs. Abdomen:  Soft, nontender, no organomegaly or mass.  Bowel sounds normal.  No pulsatile abdominal mass or bruit. Ext:  No edema. Pulses full and equal. Skin:  Warm and dry.  No rash.  Labs:   Results for orders placed during the hospital encounter of 07/15/12  POCT I-STAT, CHEM 8      Component Value Range   Sodium 141  135 - 145 mEq/L   Potassium 3.7  3.5 - 5.1 mEq/L   Chloride 103  96 - 112 mEq/L   BUN 9  6 - 23 mg/dL   Creatinine, Ser 4.09  0.50 - 1.10 mg/dL   Glucose, Bld 83  70 - 99 mg/dL   Calcium, Ion 8.11 (*) 1.12 - 1.23 mmol/L   TCO2 25  0 - 100 mmol/L   Hemoglobin 13.6  12.0 - 15.0 g/dL   HCT 91.4  78.2 - 95.6 %     EKG:   Date: 07/15/2012  Rate: 93  Rhythm: sinus arrhythmia  QRS Axis: normal  Intervals: normal  ST/T Wave abnormalities: normal  Conduction Disutrbances:none  Narrative Interpretation: Normal sinus rhythm and sinus arrhythmia, normal EKG.  Old EKG Reviewed: none available  Assessment:  The encounter diagnosis was Palpitations.  It sounds as if she was having PACs or PVCs and the episode that she had today it might of been a short-lived episodes of PSVT. She will need further workup and I have referred her to see Dr. Dietrich Pates for this. In the meantime, I suggested that she stop the Zyrtec-D and the herbal weight loss remedy and avoid any decongestants and caffeine. If she has any further episodes of rapid heartbeat, I suggested she come here immediately or go to the emergency room and that she travel via EMS. She  works for a Administrator, [employer], so as possible she could get an EKG there.  Plan:   1.  The following meds were prescribed:   New Prescriptions   No medications on file   2.  The patient was instructed in symptomatic care and handouts were given. 3.  The patient was told to return if becoming worse in any way, if no better in 3 or 4 days, and given some red flag symptoms including syncope, presyncope, dyspnea, or chest pain that would indicate earlier return.  Follow up:  The patient was told to follow up with Dr. Dietrich Pates as soon as possible.     Reuben Likes, MD 07/15/12 2135

## 2012-07-16 NOTE — ED Notes (Signed)
TSH report reviewed, results are WNL

## 2012-07-29 ENCOUNTER — Telehealth (HOSPITAL_COMMUNITY): Payer: Self-pay | Admitting: *Deleted

## 2012-07-29 NOTE — ED Notes (Signed)
Pt. called on VM for her lab results.  I called her back.  Pt. verified x 2 and given result and range. (TSH 1.279)  Pt. told it was normal. Vassie Moselle 07/29/2012

## 2012-10-05 ENCOUNTER — Ambulatory Visit (INDEPENDENT_AMBULATORY_CARE_PROVIDER_SITE_OTHER): Payer: Self-pay | Admitting: Physician Assistant

## 2012-10-05 ENCOUNTER — Encounter: Payer: Self-pay | Admitting: Physician Assistant

## 2012-10-05 VITALS — BP 110/68 | HR 80 | Ht 64.0 in | Wt 201.8 lb

## 2012-10-05 DIAGNOSIS — R002 Palpitations: Secondary | ICD-10-CM

## 2012-10-05 DIAGNOSIS — E049 Nontoxic goiter, unspecified: Secondary | ICD-10-CM

## 2012-10-05 LAB — T3, FREE: T3, Free: 2.8 pg/mL (ref 2.3–4.2)

## 2012-10-05 LAB — T4, FREE: Free T4: 0.86 ng/dL (ref 0.60–1.60)

## 2012-10-05 LAB — BASIC METABOLIC PANEL
CO2: 29 mEq/L (ref 19–32)
Calcium: 9.1 mg/dL (ref 8.4–10.5)
Creatinine, Ser: 0.7 mg/dL (ref 0.4–1.2)

## 2012-10-05 LAB — TSH: TSH: 1.31 u[IU]/mL (ref 0.35–5.50)

## 2012-10-05 NOTE — Patient Instructions (Addendum)
Your physician recommends that you return for lab work in: today (bmet, magnesium, tsh, free t3, free t4)  Your physician has recommended that you wear an event monitor for 21 days. Event monitors are medical devices that record the heart's electrical activity. Doctors most often [place] these monitors to diagnose arrhythmias. Arrhythmias are problems with the speed or rhythm of the heartbeat. The monitor is a small, portable device. You can wear one while you do your normal daily activities. This is usually used to diagnose what is causing palpitations/syncope (passing out).  Your physician has requested that you have an echocardiogram. Echocardiography is a painless test that uses sound waves to create images of your heart. It provides your doctor with information about the size and shape of your heart and how well your heart's chambers and valves are working. This procedure takes approximately one hour. There are no restrictions for this procedure.  Your physician recommends that you schedule a follow-up appointment in: 6 weeks with Dr Tenny Craw  Your physician recommends that you have an ultrasound of your thyroid

## 2012-10-05 NOTE — Progress Notes (Signed)
[address], [address] [place], [place]  [address] Phone: [phone], Fax:  [phone]  Date:  10/05/2012   ID:  Deanna Schwartz, DOB 1981-02-04, MRN [id number]  PCP:  Sonda Primes, MD  Primary Cardiologist:  Dr. Dietrich Pates     History of Present Illness: Deanna Schwartz is a 32 y.o. female who returns for evaluation of palpitations.   She has a history of palpitations, asthma, anxiety, GERD. Echo 12/07: EF 55-65%. Last seen by Dr. Tenny Craw 6/12. Palpitations or not felt to be significant.  Followup was not planned. Patient was seen the emergency room 11/13 with a sensation of heart fluttering. Notes indicate she was taking Zyrtec-D as well as an herbal bee pollen extract for weight loss at that time.  Since that time, she has continued to notice palpitations that she describes as skipped beats. They seem to be increasing in frequency. She eliminated Zyrtec D as well as reduced her caffeine intake significantly. She sometimes notes rapid palpitations. She denies chest pain, shortness of breath, orthopnea, PND or edema. She denies syncope or near-syncope.  Labs (8/13):    K 4.2, creatinine 0.7, ALT 12, LDL 54 Labs (11/13):  K 3.7, creatinine 0.8, Hgb 13.6, TSH 1.279  Wt Readings from Last 3 Encounters:  04/29/12 191 lb 9.6 oz (86.909 kg)  04/21/12 197 lb (89.359 kg)  01/19/12 188 lb (85.276 kg)     Past Medical History  Diagnosis Date  . Asthma   . Anxiety   . GERD (gastroesophageal reflux disease)   . Allergic rhinitis   . Chronic constipation   . Anemia   . H. pylori infection 2008    Hx of tx + serology  . Panic attack   . Irritable bowel syndrome   . Obesity     Current Outpatient Prescriptions  Medication Sig Dispense Refill  . albuterol (PROVENTIL HFA;VENTOLIN HFA) 108 (90 BASE) MCG/ACT inhaler Inhale 2 puffs into the lungs every 6 (six) hours as needed for wheezing.  3 Inhaler  3  . Azelastine-Fluticasone (DYMISTA) 137-50 MCG/ACT SUSP Place 1 Act  into the nose 1 day or 1 dose.  3 Bottle  3  . BEE POLLEN PO Take 350 mg by mouth daily.      . cetirizine-pseudoephedrine (ZYRTEC-D) 5-120 MG per tablet Take 1 tablet by mouth 2 (two) times daily.  180 tablet  0  . Cyanocobalamin (VITAMIN B-12) 1000 MCG SUBL Place 1 tablet (1,000 mcg total) under the tongue daily.  100 tablet  3  . dexlansoprazole (DEXILANT) 60 MG capsule Take 1 capsule (60 mg total) by mouth daily.  90 capsule  3  . dextromethorphan-guaiFENesin (ROBITUSSIN-DM) 10-100 MG/5ML liquid Take 5 mLs by mouth every 4 (four) hours as needed. For cough suppressant and congestion      . iron polysaccharides (NIFEREX) 150 MG capsule Take 150 mg by mouth 2 (two) times daily.      . ondansetron (ZOFRAN) 4 MG tablet Take 1 tab every 4-6 hours as needed for nausea.  20 tablet  1  . polyethylene glycol powder (MIRALAX) powder Take 17 g by mouth daily.  527 g  6  . Spacer/Aero-Holding Deretha [facility]) MISC 1 each by Other route once.  1 each  0    Allergies:    Allergies  Allergen Reactions  . Cephalexin     REACTION: nausea  . Iodine Swelling    Throat swelling    Social History:  The patient  reports that she has  never smoked. She has never used smokeless tobacco. She reports that she drinks alcohol. She reports that she does not use illicit drugs.   ROS:  Please see the history of present illness.   She has noted some right-sided headaches. She denies visual changes or TIA symptoms. She denies diarrhea or skin changes or weight loss. She does note some unusual sensation in her throat with her palpitations.   All other systems reviewed and negative.   PHYSICAL EXAM: VS:  BP 110/68  Pulse 80  Ht 5\' 4"  (1.626 m)  Wt 201 lb 12.8 oz (91.536 kg)  BMI 34.64 kg/m2 Well nourished, well developed, in no acute distress HEENT: normal Neck: no JVD Endocrine: Thyroid gland appears enlarged, no obvious nodules Cardiac:  normal S1, S2; RRR; no murmur Lungs:  clear to auscultation  bilaterally, no wheezing, rhonchi or rales Abd: soft, nontender, no hepatomegaly Ext: no edema Skin: warm and dry Neuro:  CNs 2-12 intact, no focal abnormalities noted  EKG:  NSR, HR 80, normal axis, J-point elevation, no change from prior tracing     ASSESSMENT AND PLAN:  1. Palpitations:  By description, her symptoms are very suspicious for PACs versus PVCs. We discussed the benign nature of this. However, they seem to be coming more frequently. I will arrange a 21 day event monitor. Check an echocardiogram. I will also check a basic metabolic panel, magnesium, TSH, free T4 and free T3. 2. Thyromegaly:  Obtain thyroid ultrasound and TFTs as noted. 3. Disposition:  Followup with Dr. Tenny Craw in 6 weeks.  Luna Glasgow, PA-C  8:24 AM 10/05/2012

## 2012-10-07 ENCOUNTER — Ambulatory Visit (HOSPITAL_COMMUNITY): Payer: Self-pay | Attending: Internal Medicine

## 2012-10-07 DIAGNOSIS — J45909 Unspecified asthma, uncomplicated: Secondary | ICD-10-CM | POA: Insufficient documentation

## 2012-10-07 DIAGNOSIS — R002 Palpitations: Secondary | ICD-10-CM | POA: Insufficient documentation

## 2012-10-07 DIAGNOSIS — K219 Gastro-esophageal reflux disease without esophagitis: Secondary | ICD-10-CM | POA: Insufficient documentation

## 2012-10-07 NOTE — Progress Notes (Signed)
Echocardiogram performed.  

## 2012-10-08 ENCOUNTER — Telehealth: Payer: Self-pay | Admitting: Internal Medicine

## 2012-10-08 ENCOUNTER — Ambulatory Visit (HOSPITAL_COMMUNITY)
Admission: RE | Admit: 2012-10-08 | Discharge: 2012-10-08 | Disposition: A | Discharge status: Home / Self Care | Payer: Self-pay | Source: Ambulatory Visit | Attending: Physician Assistant | Admitting: Physician Assistant

## 2012-10-08 DIAGNOSIS — E042 Nontoxic multinodular goiter: Secondary | ICD-10-CM | POA: Insufficient documentation

## 2012-10-08 DIAGNOSIS — E049 Nontoxic goiter, unspecified: Secondary | ICD-10-CM | POA: Insufficient documentation

## 2012-10-08 NOTE — Telephone Encounter (Signed)
**Note De-identified Jalaila Caradonna Obfuscation** LMTCB

## 2012-10-08 NOTE — Telephone Encounter (Signed)
Pt is advised that we do not have her Thyroid scan results at this time, scan was just done this morning.  Also, she is advised that Wende Mott, PA-C has not seen her Echo results and that when he does we will contact her with results. She verbalized understanding.

## 2012-10-08 NOTE — Telephone Encounter (Signed)
New Problem:   Patient called in wanting to know if we had received the results of her latest ECHO and scan of her Thyroid.  Please call back.

## 2012-10-11 ENCOUNTER — Telehealth: Payer: Self-pay | Admitting: Internal Medicine

## 2012-10-11 NOTE — Telephone Encounter (Signed)
pt notified about u/s mild goiter on thyroid, needs appt with PCP to further evaluate .

## 2012-10-11 NOTE — Telephone Encounter (Signed)
Pt rtn  Call from Friday  re echo and thyroid results

## 2012-10-11 NOTE — Telephone Encounter (Signed)
Message copied by Tarri Fuller on Mon Oct 11, 2012  2:31 PM ------      Message from: VIA, PATRICIA M      Created: Fri Oct 08, 2012  4:14 PM       Will forward to nurse

## 2012-10-12 ENCOUNTER — Telehealth: Payer: Self-pay | Admitting: Internal Medicine

## 2012-10-12 NOTE — Telephone Encounter (Signed)
Will forward to [employer].  Pt is calling in regards to Echo [place] ordered.

## 2012-10-12 NOTE — Telephone Encounter (Signed)
New Problem:    Patient called in wanting to know the results of her latest ECHO.  Please call back.

## 2012-10-15 NOTE — Telephone Encounter (Signed)
I apologized to pt that it has taken some time for the results of her echo; that the PA is still wanting Dr. Ross to evaluate the EF on echo, pt said ok. I advised pt that as soon as I have the full results I will call her , she said ok and thank you. 

## 2012-10-15 NOTE — Telephone Encounter (Signed)
I apologized to pt that it has taken some time for the results of her echo; that the PA is still wanting Dr. Tenny Craw to evaluate the EF on echo, pt said ok. I advised pt that as soon as I have the full results I will call her , she said ok and thank you.

## 2012-10-18 ENCOUNTER — Telehealth: Payer: Self-pay | Admitting: *Deleted

## 2012-10-18 NOTE — Telephone Encounter (Signed)
pt notified about echo results now that Dr. Tenny Craw has evaluated the EF per Bing Neighbors. PAC, pt verbalized understanding today

## 2012-10-23 ENCOUNTER — Other Ambulatory Visit: Payer: Self-pay

## 2012-11-04 ENCOUNTER — Telehealth: Payer: Self-pay | Admitting: Internal Medicine

## 2012-11-04 NOTE — Telephone Encounter (Signed)
Patient Information:  Caller Name: Quida  Phone: [phone]  Patient: Deanna Schwartz  Gender: Female  DOB: 1981/05/02  Age: 31 Years  PCP: Plotnikov, Alex (Adults only)  Pregnant: No  Office Follow Up:  Does the office need to follow up with this patient?: No  Instructions For The Office: N/A  RN Note:  Called for antibiotic for chest congestion and yellow productive cough. Using Albuerol only at night; feels it triggers more coughing. Can hear chest rattle and mild wheezing.  Instructed to use Albuerol MDI every 4-6 hours.  Call pharmacy to request Albuterol refill; do not continue to use daugher's Albuterol. Informed MD orders are must be seen for antibiotic to be ordered.  Is unable to schedule appointment until 11/08/12 due to leaving town; disconnected during transfer to office scheduler.  Symptoms  Reason For Call & Symptoms: Called to request a  Z-pack for chest congestion with productive cough.  Reviewed Health History In EMR: Yes  Reviewed Medications In EMR: Yes  Reviewed Allergies In EMR: Yes  Reviewed Surgeries / Procedures: Yes  Date of Onset of Symptoms: 10/28/2012  Treatments Tried: Mucinex, Delsym  Treatments Tried Worked: No OB / GYN:  LMP: 10/22/2012  Guideline(s) Used:  Cough  Disposition Per Guideline:   Go to Office Now  Reason For Disposition Reached:   Wheezing is present  Advice Given:  Reassurance  Coughing is the way that our lungs remove irritants and mucus. It helps protect our lungs from getting pneumonia.  You can get a dry hacking cough after a chest cold. Sometimes this type of cough can last 1-3 weeks, and be worse at night.  Cough Medicines:  Home Remedy - Hard Candy: Hard candy works just as well as medicine-flavored OTC cough drops. Diabetics should use sugar-free candy.  Home Remedy - Honey: This old home remedy has been shown to help decrease coughing at night. The adult dosage is 2 teaspoons (10 ml) at bedtime. Honey should  not be given to infants under one year of age.  Caution - Dextromethorphan:   Do not try to completely suppress coughs that produce mucus and phlegm. Remember that coughing is helpful in bringing up mucus from the lungs and preventing pneumonia.  Coughing Spasms:  Drink warm fluids. Inhale warm mist (Reason: both relax the airway and loosen up the phlegm).  Suck on cough drops or hard candy to coat the irritated throat.  Prevent Dehydration:  Drink adequate liquids.  This will help soothe an irritated or dry throat and loosen up the phlegm.  Call Back If:  Difficulty breathing  Cough lasts more than 3 weeks  Fever lasts > 3 days  You become worse.  RN Overrode Recommendation:  Follow Up With Office Later  Currently going out of town; will call back for appointment for 11/08/12

## 2012-11-06 LAB — HM PAP SMEAR

## 2012-11-08 ENCOUNTER — Ambulatory Visit: Payer: Self-pay | Admitting: Internal Medicine

## 2012-11-13 ENCOUNTER — Encounter: Payer: Self-pay | Admitting: Family Medicine

## 2012-11-13 ENCOUNTER — Ambulatory Visit (INDEPENDENT_AMBULATORY_CARE_PROVIDER_SITE_OTHER): Payer: Self-pay | Admitting: Family Medicine

## 2012-11-13 VITALS — BP 134/72 | HR 82 | Temp 97.6°F | Wt 203.0 lb

## 2012-11-13 DIAGNOSIS — J0100 Acute maxillary sinusitis, unspecified: Secondary | ICD-10-CM

## 2012-11-13 DIAGNOSIS — J019 Acute sinusitis, unspecified: Secondary | ICD-10-CM

## 2012-11-13 DIAGNOSIS — J45901 Unspecified asthma with (acute) exacerbation: Secondary | ICD-10-CM

## 2012-11-13 MED ORDER — PREDNISONE 20 MG PO TABS: ORAL_TABLET | ORAL | Status: DC

## 2012-11-13 MED ORDER — AMOXICILLIN-POT CLAVULANATE 875-125 MG PO TABS: 1.0000 | ORAL_TABLET | Freq: Two times a day (BID) | ORAL | Status: DC

## 2012-11-13 MED ORDER — METHYLPREDNISOLONE ACETATE 80 MG/ML IJ SUSP: 80.0000 mg | Freq: Once | INTRAMUSCULAR | Status: DC

## 2012-11-13 MED ORDER — METHYLPREDNISOLONE ACETATE 80 MG/ML IJ SUSP
80.0000 mg | Freq: Once | INTRAMUSCULAR | Status: AC
  Administered 2012-11-13: 80 mg via INTRAMUSCULAR

## 2012-11-13 MED ORDER — ALBUTEROL SULFATE (2.5 MG/3ML) 0.083% IN NEBU
2.5000 mg | INHALATION_SOLUTION | RESPIRATORY_TRACT | Status: AC
  Administered 2012-11-13: 2.5 mg via RESPIRATORY_TRACT

## 2012-11-13 NOTE — Assessment & Plan Note (Signed)
Augmentin 875/125, 1 bid x 10d. Saline nasal spray multiple times per day.

## 2012-11-13 NOTE — Progress Notes (Signed)
OFFICE NOTE  11/13/2012  CC:  Chief Complaint  Patient presents with  . Pt here with c/o SOB x 2 weeks with temperary relief using h     HPI: Patient is a 32 y.o. Hispanic female who is here for SOB/wheezing. Pt presents complaining of respiratory symptoms for 14+ days.  Primary symptoms are: cough, wheeze, SOB, chest tightness.  Everything started with URI sx's and these sx's are still present; nose and cough prod of yellow mucous.  Worst symptoms seems to be the wheeze/sob.  Lately the symptoms seem to be worsening.  Latest inhaler use was about 5 hours ago. Pertinent negatives: No fevers. No pain in face or teeth.  No significant HA.  ST mild at most.   Symptoms made worse by activity and NIGHT-TIME.  Symptoms improved by albuterol short term.. Smoker? no Flu shot this season at least 2 wks ago? yes  Additional ROS: no n/v/d or abdominal pain.  No rash.  No neck stiffness.   +Mild fatigue.  +Mild appetite loss.   Pertinent PMH:  Past Medical History  Diagnosis Date  . Asthma   . Anxiety   . GERD (gastroesophageal reflux disease)   . Allergic rhinitis   . Chronic constipation   . Anemia   . H. pylori infection 2008    Hx of tx + serology  . Panic attack   . Irritable bowel syndrome   . Obesity   She has been hospitalized for asthma in the past (01/2012)--this was her most recent flare.  She has been on daily controller therapy in the past but pt says this was d/c'd because she was doing so good and not having any asthma problems.  Past Surgical History  Procedure Laterality Date  . Nasal sinus surgery    . Flexible sigmoidoscopy  04/14/2008    normal    MEDS:  Albuterol inhaler, dexilant, miralax  PE: Blood pressure 134/72, pulse 82, temperature 97.6 F (36.4 C), temperature source Oral, weight 203 lb (92.08 kg), SpO2 99.00%. VS: noted--normal. Gen: alert, NAD, NONTOXIC APPEARING. HEENT: eyes without injection, drainage, or swelling.  Ears: EACs clear, TMs with  normal light reflex and landmarks.  Nose:  Dried purulent d/c.  Left paranasal sinus TTP.  No facial swelling.  Throat and mouth without focal lesion.  No pharyngial swelling, erythema, or exudate.   Neck: supple, no LAD.   LUNGS: Diffuse insp/exp wheezing, prolonged exp phase, aeration is fine, nonlabored resps.   Frequent post-exhalation coughing.  CV: RRR, no m/r/g. EXT: no c/c/e SKIN: no rash  IMPRESSION AND PLAN:  Acute asthma flare She sounded and felt improved with 2.5mg  neb in office today. Depo medrol 80 mg IM given today in office. Start prednisone 40mg  qd x 5d tomorrow. Use albuterol inhaler 2 puffs q4h prn. Signs/symptoms to call or return for were reviewed and pt expressed understanding.   Acute maxillary sinusitis Augmentin 875/125, 1 bid x 10d. Saline nasal spray multiple times per day.     An After Visit Summary was printed and given to the patient.  FOLLOW UP: 1 wk with Dr. Posey Rea

## 2012-11-13 NOTE — Assessment & Plan Note (Signed)
She sounded and felt improved with 2.5mg  neb in office today. Depo medrol 80 mg IM given today in office. Start prednisone 40mg  qd x 5d tomorrow. Use albuterol inhaler 2 puffs q4h prn. Signs/symptoms to call or return for were reviewed and pt expressed understanding.

## 2012-11-15 ENCOUNTER — Telehealth: Payer: Self-pay | Admitting: Internal Medicine

## 2012-11-15 NOTE — Telephone Encounter (Signed)
[facility] Triage Call Report Triage Record Num: [id number] Operator: Bennie Hind Patient Name: Deanna Schwartz Call Date & Time: 11/13/2012 10:24:05AM Patient Phone: [phone] PCP: Sonda Primes Patient Gender: Female PCP Fax : [phone] Patient DOB: 14-Apr-1981 [facility] Name[facility] Reason for Call: Caller: [name]/Patient; PCP: Sonda Primes (Adults only); CB#: [phone]; Call regarding Asthma; 11-13-12 has had cold sxs for a couple weeks, is coughing up yellow mucous and is blowing yellow out of her nose. She has no fever. She said she gets short term relief with her inhaler. She was speaking complete sentences to me without hearing any shortness of breath All emergent sxs per Asthma ruled out except for producing large amts of yellow mucous when coughing or blowing nose. Appt made for 1045 today with Dr Dan Humphreys Protocol(s) Used: Asthma - Adult Recommended Outcome per Protocol: Call Provider Immediately Reason for Outcome: Producing large amounts of green or yellow mucus when coughing or blowing nose.

## 2012-11-15 NOTE — Telephone Encounter (Signed)
Noted. Thx.

## 2012-11-22 ENCOUNTER — Ambulatory Visit: Payer: Self-pay | Admitting: Internal Medicine

## 2013-03-14 ENCOUNTER — Telehealth: Payer: Self-pay | Admitting: Internal Medicine

## 2013-03-14 NOTE — Telephone Encounter (Signed)
Increasing RLQ pain x 1 month Was in NKJ and was light-headed and nauseous - labs showed Hgb 10 and ferritin 4  Also having metromenorrhagia  Has been on Fe SO4 in past - Hgb normal 07/2012   Has gotten back on FeSO4  Has a call  In to her GYN for evaluation - hopefully this week  Sounds GYN - I told her to let me know  She has had EGD, sigmoidoscopy in past Bowels ok now

## 2013-03-14 NOTE — Telephone Encounter (Signed)
She also has hx B12 deficiency and needs recheck of that.

## 2013-03-18 ENCOUNTER — Other Ambulatory Visit (INDEPENDENT_AMBULATORY_CARE_PROVIDER_SITE_OTHER): Payer: Self-pay

## 2013-03-18 DIAGNOSIS — E538 Deficiency of other specified B group vitamins: Secondary | ICD-10-CM

## 2013-03-18 NOTE — Progress Notes (Signed)
Quick Note:  B12 is low She needs to take 1000ug oral B12 daily  Also - needs 1000ug [place]/im B12 if possible - would do weekly x 4 if possible (? If can get at an outpatient pharmacy) and then every 3 months while taking po  Low B12 contributes to anemia and can make her feel bad also  Let me know what you can do Am ccing her GYN Dr. Senaida Ores also ______

## 2013-03-22 ENCOUNTER — Other Ambulatory Visit: Payer: Self-pay

## 2013-03-22 MED ORDER — CYANOCOBALAMIN 1000 MCG/ML IJ SOLN: INTRAMUSCULAR | Status: DC

## 2013-03-29 ENCOUNTER — Ambulatory Visit (INDEPENDENT_AMBULATORY_CARE_PROVIDER_SITE_OTHER): Payer: Self-pay | Admitting: Internal Medicine

## 2013-03-29 DIAGNOSIS — D649 Anemia, unspecified: Secondary | ICD-10-CM

## 2013-03-29 MED ORDER — CYANOCOBALAMIN 1000 MCG/ML IJ SOLN
1000.0000 ug | INTRAMUSCULAR | Status: AC
  Administered 2013-03-29 – 2013-04-06 (×2): 1000 ug via INTRAMUSCULAR

## 2013-04-06 ENCOUNTER — Ambulatory Visit (INDEPENDENT_AMBULATORY_CARE_PROVIDER_SITE_OTHER): Payer: Self-pay | Admitting: Internal Medicine

## 2013-04-06 DIAGNOSIS — E538 Deficiency of other specified B group vitamins: Secondary | ICD-10-CM

## 2013-04-06 DIAGNOSIS — D649 Anemia, unspecified: Secondary | ICD-10-CM

## 2013-04-06 NOTE — Progress Notes (Signed)
Patient tolerated well.

## 2013-04-28 ENCOUNTER — Ambulatory Visit (INDEPENDENT_AMBULATORY_CARE_PROVIDER_SITE_OTHER): Payer: Self-pay | Admitting: Internal Medicine

## 2013-04-28 DIAGNOSIS — E538 Deficiency of other specified B group vitamins: Secondary | ICD-10-CM

## 2013-04-29 ENCOUNTER — Telehealth: Payer: Self-pay

## 2013-04-29 MED ORDER — CYANOCOBALAMIN 1000 MCG/ML IJ SOLN
1000.0000 ug | Freq: Once | INTRAMUSCULAR | Status: AC
  Administered 2013-04-28: 1000 ug via INTRAMUSCULAR

## 2013-04-29 NOTE — Progress Notes (Signed)
B12 injection 

## 2013-04-29 NOTE — Telephone Encounter (Signed)
Dr. Leone Payor, I received an email from Es this am see below and advise   Deanna Schwartz can you please ask Dr. Leone Payor if he can order a ct abd pel for me. He or Amy wanted that done a while back and with meds my pain was subsided but today I got up with a lot of left side discomfort that it's a bit difficult for me even to sit.   I didn't want to bother but I need to know what in the world is going on. I really don't want to go to the ED because that fee is going to be super high.

## 2013-04-29 NOTE — Telephone Encounter (Signed)
OK to order CT abd/pelvis with contrast to evaluate LLQ pain

## 2013-05-02 NOTE — Telephone Encounter (Signed)
Patient will come on Monday to discuss prior to setting up CT.  Per Dr. Elzie Rings he would like to speak with her first.

## 2013-05-13 ENCOUNTER — Ambulatory Visit: Payer: Self-pay | Attending: Family Medicine | Admitting: Internal Medicine

## 2013-05-13 ENCOUNTER — Encounter: Payer: Self-pay | Admitting: Internal Medicine

## 2013-05-13 VITALS — BP 99/68 | HR 84 | Temp 98.5°F | Resp 16 | Ht 63.78 in | Wt 200.0 lb

## 2013-05-13 DIAGNOSIS — J45909 Unspecified asthma, uncomplicated: Secondary | ICD-10-CM

## 2013-05-13 DIAGNOSIS — R1012 Left upper quadrant pain: Secondary | ICD-10-CM | POA: Insufficient documentation

## 2013-05-13 DIAGNOSIS — K589 Irritable bowel syndrome without diarrhea: Secondary | ICD-10-CM

## 2013-05-13 DIAGNOSIS — E538 Deficiency of other specified B group vitamins: Secondary | ICD-10-CM

## 2013-05-13 LAB — CBC WITH DIFFERENTIAL/PLATELET
Basophils Relative: 1 % (ref 0–1)
Eosinophils Absolute: 0.5 10*3/uL (ref 0.0–0.7)
Eosinophils Relative: 10 % — ABNORMAL HIGH (ref 0–5)
Hemoglobin: 11.1 g/dL — ABNORMAL LOW (ref 12.0–15.0)
MCH: 24.1 pg — ABNORMAL LOW (ref 26.0–34.0)
MCHC: 31.4 g/dL (ref 30.0–36.0)
Monocytes Absolute: 0.3 10*3/uL (ref 0.1–1.0)
Monocytes Relative: 7 % (ref 3–12)
Neutrophils Relative %: 47 % (ref 43–77)

## 2013-05-13 LAB — BASIC METABOLIC PANEL
BUN: 10 mg/dL (ref 6–23)
Calcium: 9.2 mg/dL (ref 8.4–10.5)
Creat: 0.74 mg/dL (ref 0.50–1.10)
Glucose, Bld: 79 mg/dL (ref 70–99)

## 2013-05-13 LAB — IRON: Iron: 59 ug/dL (ref 42–145)

## 2013-05-13 LAB — LIPID PANEL: Cholesterol: 137 mg/dL (ref 0–200)

## 2013-05-13 LAB — IBC PANEL
%SAT: 14 % — ABNORMAL LOW (ref 20–55)
TIBC: 425 ug/dL (ref 250–470)

## 2013-05-13 MED ORDER — DICYCLOMINE HCL 10 MG PO CAPS: 10.0000 mg | ORAL_CAPSULE | Freq: Two times a day (BID) | ORAL | Status: DC | PRN

## 2013-05-13 MED ORDER — ALBUTEROL SULFATE HFA 108 (90 BASE) MCG/ACT IN AERS: 2.0000 | INHALATION_SPRAY | Freq: Four times a day (QID) | RESPIRATORY_TRACT | Status: DC | PRN

## 2013-05-13 NOTE — Progress Notes (Unsigned)
Pt here to establish care. Pt want to have a physical today. Pt reports having pain that comes and goes in her LUQ. The pain started 3 months ago and the pain is sharp, dull and some pressure.

## 2013-05-13 NOTE — Progress Notes (Unsigned)
Patient ID: Deanna Schwartz, female   DOB: 1981-02-28, 32 y.o.   MRN: [id number] Patient Demographics  Deanna Schwartz, is a 32 y.o. female  [id number]  [id number]  DOB - 02/05/1981  Chief Complaint  Patient presents with  . Establish Care        Subjective:   Deanna Schwartz today is here to establish primary care.  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   Patient is complaining of left upper quadrant abdominal pain radiating to the back, intermittent, almost every day, cramping in nature, will spontaneously resolves in an hour. Patient denies any Nausea, vomiting, diarrhea or constipation. Patient reports that she had transvaginal and pelvic ultrasound with her OB/GYN, (unable to see the results) and was told cyst in the left ovary otherwise no explainable cause for her left upper quadrant pain  Patient has No headache, No chest pain, No Nausea, No new weakness tingling or numbness, No Cough - SOB.   Objective:    Filed Vitals:   05/13/13 0957  BP: 99/68  Pulse: 84  Temp: 98.5 F (36.9 C)  TempSrc: Oral  Resp: 16  Height: 5' 3.78" (1.62 m)  Weight: 200 lb (90.719 kg)  SpO2: 99%     ALLERGIES:   Allergies  Allergen Reactions  . Cephalexin     REACTION: nausea  . Iodine Swelling    Throat swelling    PAST MEDICAL HISTORY: Past Medical History  Diagnosis Date  . Asthma   . Anxiety   . GERD (gastroesophageal reflux disease)   . Allergic rhinitis   . Chronic constipation   . Anemia   . H. pylori infection 2008    Hx of tx + serology  . Panic attack   . Irritable bowel syndrome   . Obesity     PAST SURGICAL HISTORY: Past Surgical History  Procedure Laterality Date  . Nasal sinus surgery    . Flexible sigmoidoscopy  04/14/2008    normal    FAMILY HISTORY: Family  History  Problem Relation Age of Onset  . Diabetes      grandmother  . Hypertension    . Colon cancer Neg Hx   . Hypertension Mother   . Hypertension Father   . Diabetes Maternal Aunt   . Diabetes Maternal Aunt     MEDICATIONS AT HOME: Prior to Admission medications   Medication Sig Start Date End Date Taking? Authorizing Provider  albuterol (PROVENTIL HFA;VENTOLIN HFA) 108 (90 BASE) MCG/ACT inhaler Inhale 2 puffs into the lungs every 6 (six) hours as needed for wheezing. 05/13/13  Yes Uriyah Raska Jenna Luo, MD  cyanocobalamin (,VITAMIN B-12,) 1000 MCG/ML injection Inject 1000 mcg IM weekly x4, then q 3 months4 03/22/13  Yes Iva Boop, MD  iron polysaccharides (NIFEREX) 150 MG capsule Take 150 mg by mouth 2 (two) times daily.   Yes Historical Provider, MD  dexlansoprazole (DEXILANT) 60 MG capsule Take 60 mg by mouth daily. 04/21/12 11/04/12  Georgina Quint Plotnikov, MD  dicyclomine (BENTYL) 10 MG capsule Take 1 capsule (10 mg total) by mouth 2 (two) times daily as needed (abdominal spasms/cramps). 05/13/13   Jermiah Soderman Jenna Luo, MD  polyethylene glycol powder (MIRALAX) powder Take 17 g by mouth daily. 02/19/11   Iva Boop, MD  predniSONE (DELTASONE) 20 MG tablet 2 tabs po qd x 5d 11/13/12  Jeoffrey Massed, MD  Spacer/Aero-Holding [facility]) MISC 1 each by Other route once. 01/13/12   Jacques Navy, MD    REVIEW OF SYSTEMS:  Constitutional:   No   Fevers, chills, fatigue.  HEENT:    No headaches, Sore throat,   Cardio-vascular: No chest pain,  Orthopnea, swelling in lower extremities, anasarca, palpitations  GI:   see history of present illness  Resp: No shortness of breath,  No coughing up of blood.No cough.No wheezing.  Skin:  no rash or lesions.  GU:  no dysuria, change in color of urine, no urgency or frequency.  No flank pain.  Musculoskeletal: No joint pain or swelling.  No decreased range of motion.  No back pain.  Psych: No change in mood or affect. No  depression or anxiety.  No memory loss.   Exam  General appearance :Awake, alert, NAD, Speech Clear. HEENT: Atraumatic and Normocephalic, PERLA Neck: supple, no JVD. No cervical lymphadenopathy.  Chest: clear to auscultation bilaterally, no wheezing, rales or rhonchi CVS: S1 S2 regular, no murmurs.  Abdomen: soft, NBS, ND, no gaurding, rigidity or rebound.During palpation, patient did report mild left upper quadrant tenderness on deep palpation  Extremities: No cyanosis, clubbing, B/L Lower Ext shows no edema,  Neurology: Awake alert, and oriented X 3, CN II-XII intact, Non focal Skin:No Rash or lesions Wounds: N/A    Data Review   Basic Metabolic Panel: No results found for this basename: NA, K, CL, CO2, GLUCOSE, BUN, CREATININE, CALCIUM, MG, PHOS,  in the last 168 hours Liver Function Tests: No results found for this basename: AST, ALT, ALKPHOS, BILITOT, PROT, ALBUMIN,  in the last 168 hours  CBC: No results found for this basename: WBC, NEUTROABS, HGB, HCT, MCV, PLT,  in the last 168 hours ------------------------------------------------------------------------------------------------------------------ No results found for this basename: HGBA1C,  in the last 72 hours ------------------------------------------------------------------------------------------------------------------ No results found for this basename: CHOL, HDL, LDLCALC, TRIG, CHOLHDL, LDLDIRECT,  in the last 72 hours ------------------------------------------------------------------------------------------------------------------ No results found for this basename: TSH, T4TOTAL, FREET3, T3FREE, THYROIDAB,  in the last 72 hours ------------------------------------------------------------------------------------------------------------------ No results found for this basename: VITAMINB12, FOLATE, FERRITIN, TIBC, IRON, RETICCTPCT,  in the last 72 hours  Coagulation profile  No results found for this basename: INR,  PROTIME,  in the last 168 hours    Assessment & Plan   Active Problems: Chronic left upper quadrant pain - Unclear etiology, patient has a history of IBS, no diarrhea, nausea or vomiting, follows Dr. Leone Payor. Dr. Leone Payor had also recommended CT abdomen but due to lack of insurance patient was unable to do it. - ordered CT abdomen and pelvis, placed on Bentyl 10 mg BID PRN - patient will followup with Dr. Leone Payor if anything abnormal GI wise  Asthma: Well controlled, continue albuterol inhaler as needed  Health maintenance: Will order CBC, iron panel, lipase, lipid panel, BMET Last Pap smear this year in May, per patient was normal Ordered a flu shot today  Follow-up in 2 weeks after CT results   Jaleiyah Alas M.D. 05/13/2013, 10:18 AM

## 2013-05-16 ENCOUNTER — Telehealth: Payer: Self-pay | Admitting: Emergency Medicine

## 2013-05-16 NOTE — Telephone Encounter (Signed)
Please call to inform patient about her lab results: Became but mostly normal except that she has mild iron deficiency anemia and needs supplement. She can get iron tablets over-the-counter aren't corrected prescription for her.

## 2013-05-16 NOTE — Telephone Encounter (Signed)
Pt requesting lab results.

## 2013-05-17 ENCOUNTER — Telehealth: Payer: Self-pay | Admitting: Emergency Medicine

## 2013-05-17 ENCOUNTER — Ambulatory Visit: Payer: Self-pay

## 2013-05-17 ENCOUNTER — Other Ambulatory Visit: Payer: Self-pay | Admitting: Emergency Medicine

## 2013-05-17 MED ORDER — FERROUS GLUCONATE 216 MG PO TABS: 216.0000 mg | ORAL_TABLET | Freq: Two times a day (BID) | ORAL | Status: DC

## 2013-05-17 NOTE — Telephone Encounter (Signed)
Pt called regarding Iron blood levels. Spoke with Dr. Isidoro Donning. Script Ferrous Gloconate ordered. Pt to pick up @ [facility]

## 2013-05-17 NOTE — Telephone Encounter (Signed)
Pt requesting lab results.

## 2013-05-18 NOTE — Telephone Encounter (Signed)
Please call patient to inform her that her lab results shows iron deficiency anemia. Other results are within normal limits including her liver kidney functions as well as her cholesterol are within normal

## 2013-05-19 ENCOUNTER — Telehealth: Payer: Self-pay | Admitting: Internal Medicine

## 2013-05-19 NOTE — Telephone Encounter (Signed)
Pt says she was told she would still be called for CT scan appt but has not been called yet. Please call pt back with appt date/time.

## 2013-05-20 ENCOUNTER — Telehealth: Payer: Self-pay | Admitting: Emergency Medicine

## 2013-05-20 NOTE — Telephone Encounter (Signed)
PT GIVEN APPT FOR U/S SCHEDULED 05/23/13. PT TO ARRIVE AT 10 AM FOR ORAL CONTRAST. VERBALIZED UNDERSTADING

## 2013-05-20 NOTE — Telephone Encounter (Signed)
RESULTS GIVEN

## 2013-05-23 ENCOUNTER — Ambulatory Visit (HOSPITAL_COMMUNITY)
Admission: RE | Admit: 2013-05-23 | Discharge: 2013-05-23 | Disposition: A | Discharge status: Home / Self Care | Payer: Self-pay | Source: Ambulatory Visit | Attending: Internal Medicine | Admitting: Internal Medicine

## 2013-05-23 ENCOUNTER — Telehealth: Payer: Self-pay | Admitting: Internal Medicine

## 2013-05-23 DIAGNOSIS — E538 Deficiency of other specified B group vitamins: Secondary | ICD-10-CM

## 2013-05-23 DIAGNOSIS — K589 Irritable bowel syndrome without diarrhea: Secondary | ICD-10-CM

## 2013-05-23 DIAGNOSIS — R1012 Left upper quadrant pain: Secondary | ICD-10-CM

## 2013-05-23 DIAGNOSIS — J45909 Unspecified asthma, uncomplicated: Secondary | ICD-10-CM

## 2013-05-23 DIAGNOSIS — R109 Unspecified abdominal pain: Secondary | ICD-10-CM | POA: Insufficient documentation

## 2013-05-23 NOTE — Telephone Encounter (Signed)
Still having LLQ pain Had labs and CT scan - I looked at CT - not resulted yet  I will check results and call her back but suspect she is having problems with left ovarian cyst and will need GYN follow-up

## 2013-05-24 ENCOUNTER — Telehealth: Payer: Self-pay

## 2013-05-24 NOTE — Telephone Encounter (Signed)
I spoke with Es, she will come in and see Dr. Leone Payor on 05/26/13 3:15, she will get her records from Dr. Peggye Pitt before she comes

## 2013-05-25 ENCOUNTER — Telehealth: Payer: Self-pay | Admitting: Internal Medicine

## 2013-05-25 NOTE — Telephone Encounter (Signed)
Pt would like results for CT scan.

## 2013-05-26 ENCOUNTER — Ambulatory Visit: Payer: Self-pay | Admitting: Internal Medicine

## 2013-05-26 ENCOUNTER — Ambulatory Visit (INDEPENDENT_AMBULATORY_CARE_PROVIDER_SITE_OTHER): Payer: Self-pay | Admitting: Internal Medicine

## 2013-05-26 ENCOUNTER — Encounter: Payer: Self-pay | Admitting: Internal Medicine

## 2013-05-26 VITALS — BP 102/70 | HR 80 | Ht 63.75 in | Wt 200.8 lb

## 2013-05-26 DIAGNOSIS — E538 Deficiency of other specified B group vitamins: Secondary | ICD-10-CM

## 2013-05-26 DIAGNOSIS — D509 Iron deficiency anemia, unspecified: Secondary | ICD-10-CM

## 2013-05-26 DIAGNOSIS — R1032 Left lower quadrant pain: Secondary | ICD-10-CM

## 2013-05-26 DIAGNOSIS — R1012 Left upper quadrant pain: Secondary | ICD-10-CM

## 2013-05-26 NOTE — Progress Notes (Signed)
  Subjective:    Patient ID: Deanna Schwartz, female    DOB: April 30, 1981, 32 y.o.   MRN: [id number]  HPI Jorene Guest has been having Left abdominal and flank pain off and on for 2 months. In July she went to GYN (Dr. Ellyn Hack) and was told she had a leaking left ovarian cyst that should resolve. She has continued to struggle with burning and sharp pain in the LUQ>LLQ. Not associated w/ movement, injury, eating or bowels. She is moving her bowels well. No nausea or vomiting. Recent unenhanced CT ok as well as labs - in EMR. Saw Dr. Isidoro Donning at [facility]. She has 7 day heavy menses. Stopped Dexilant about 2 months ago but says these pains not what that helped. She took ibuprofen the other day which relieved the pain but upset stomach. She has been getting B12 injections recently 2 in July Medications, allergies, past medical history, past surgical history, family history and social history are reviewed and updated in the EMR. Review of Systems As above    Objective:   Physical Exam General:  NAD Abdomen:  soft with mild-mod tender LUQ and LLQ (short waisted) - pain + with RLE manipulation in abdomen but not back negative Carnett's sign, BS+ ata Reviewed:  Lab Results  Component Value Date   WBC 5.0 05/13/2013   HGB 11.1* 05/13/2013   HCT 35.4* 05/13/2013   MCV 76.8* 05/13/2013   PLT 276 05/13/2013       Assessment & Plan:  LLQ pain - Plan: US Transvaginal Non-OB, US Pelvis Complete  LUQ pain - Plan: US Transvaginal Non-OB, US Pelvis Complete  B12 deficiency with anti-parietal cell antibodies +  Iron deficiency anemia   1. Resume Dexilant and use ibuprofen prn 2. Review GYN records 3. Pelvic US

## 2013-05-26 NOTE — Assessment & Plan Note (Signed)
Mildly anemic, still microcytic

## 2013-05-26 NOTE — Patient Instructions (Addendum)
You have been scheduled for a pelvic ultrasound and transvaginal ultrasound at [facility] (1st floor of hospital) on 05-30-2013 at 2pm. Please arrive 15 minutes prior to your appointment for registration. Make certain not to have anything to eat or drink 6 hours prior to your appointment. Should you need to reschedule your appointment, please contact radiology at [phone]. This test typically takes about 60 minutes to perform. ARRIVE WITH A FULL BLADDER   Please restart Dexilant, samples given today

## 2013-05-26 NOTE — Assessment & Plan Note (Signed)
Had 2 B12 injectiosn in July - will need regularly - ? If level adequate yet - will need to have her get another injection and work out a schedule

## 2013-05-27 ENCOUNTER — Ambulatory Visit: Payer: Self-pay | Admitting: Internal Medicine

## 2013-05-30 ENCOUNTER — Ambulatory Visit (HOSPITAL_COMMUNITY)
Admission: RE | Admit: 2013-05-30 | Discharge: 2013-05-30 | Disposition: A | Discharge status: Home / Self Care | Payer: Self-pay | Source: Ambulatory Visit | Attending: Internal Medicine | Admitting: Internal Medicine

## 2013-05-30 ENCOUNTER — Telehealth: Payer: Self-pay | Admitting: Emergency Medicine

## 2013-05-30 DIAGNOSIS — N949 Unspecified condition associated with female genital organs and menstrual cycle: Secondary | ICD-10-CM | POA: Insufficient documentation

## 2013-05-30 DIAGNOSIS — R1012 Left upper quadrant pain: Secondary | ICD-10-CM

## 2013-05-30 DIAGNOSIS — R1032 Left lower quadrant pain: Secondary | ICD-10-CM

## 2013-05-30 NOTE — Telephone Encounter (Signed)
Pt would like results of CT scan performed on 9/15.  Please f/u with pt.

## 2013-05-30 NOTE — Telephone Encounter (Signed)
PT CALLED WITH NEGATIVE CT RESULTS

## 2013-06-03 NOTE — Telephone Encounter (Signed)
Per Dorthula Matas, RN at 05/20/2013  3:28 PM        PT GIVEN APPT FOR U/S SCHEDULED 05/23/13. PT TO ARRIVE AT 10 AM FOR ORAL CONTRAST. VERBALIZED UNDERSTADING

## 2013-06-10 NOTE — Progress Notes (Signed)
Quick Note:  Results reviewed last week Called her today - she is getting some reliefe w/ ibuprofen  I rec 2 Aleve bid x 2 weeks and then call me back ______

## 2013-06-22 ENCOUNTER — Telehealth: Payer: Self-pay | Admitting: Internal Medicine

## 2013-06-22 NOTE — Telephone Encounter (Signed)
Increasing luq pain and vomiting.

## 2013-06-27 ENCOUNTER — Telehealth: Payer: Self-pay | Admitting: Internal Medicine

## 2013-06-27 NOTE — Telephone Encounter (Signed)
Still having upper abdominal pain, and also back pain Needs to see me this week - will work with her schedule  OK to Rx Toradol 50 mg every 6 hrs prn #30 if she wants it

## 2013-06-27 NOTE — Telephone Encounter (Signed)
Left message for patient to call back  

## 2013-06-28 NOTE — Telephone Encounter (Signed)
Left message for patient to call back  

## 2013-06-29 MED ORDER — TRAMADOL HCL 50 MG PO TABS: 50.0000 mg | ORAL_TABLET | Freq: Four times a day (QID) | ORAL | Status: DC | PRN

## 2013-06-29 NOTE — Telephone Encounter (Signed)
She will come in tomorrow at 3:30 for an appt with Dr. Leone Payor I have clarified with Dr. Leone Payor, should be tramadol 50 mg 1 q 6 hours prn pain

## 2013-06-30 ENCOUNTER — Ambulatory Visit (INDEPENDENT_AMBULATORY_CARE_PROVIDER_SITE_OTHER): Payer: Self-pay | Admitting: Internal Medicine

## 2013-06-30 ENCOUNTER — Encounter: Payer: Self-pay | Admitting: Internal Medicine

## 2013-06-30 ENCOUNTER — Other Ambulatory Visit (INDEPENDENT_AMBULATORY_CARE_PROVIDER_SITE_OTHER): Payer: Self-pay

## 2013-06-30 VITALS — BP 110/64 | HR 80 | Ht 63.75 in | Wt 206.0 lb

## 2013-06-30 DIAGNOSIS — R1012 Left upper quadrant pain: Secondary | ICD-10-CM

## 2013-06-30 DIAGNOSIS — E538 Deficiency of other specified B group vitamins: Secondary | ICD-10-CM

## 2013-06-30 LAB — VITAMIN B12: Vitamin B-12: 289 pg/mL (ref 211–911)

## 2013-06-30 MED ORDER — CYANOCOBALAMIN 1000 MCG/ML IJ SOLN
1000.0000 ug | Freq: Once | INTRAMUSCULAR | Status: AC
  Administered 2013-06-30: 1000 ug via INTRAMUSCULAR

## 2013-06-30 MED ORDER — NAPROXEN SODIUM 220 MG PO TABS: 440.0000 mg | ORAL_TABLET | Freq: Two times a day (BID) | ORAL | Status: AC

## 2013-06-30 MED ORDER — VITAMIN B-12 1000 MCG PO TABS: 1000.0000 ug | ORAL_TABLET | Freq: Every day | ORAL | Status: DC

## 2013-06-30 NOTE — Assessment & Plan Note (Addendum)
Try aleve 440 mg bid x 1 month and consider topical salicylate also

## 2013-06-30 NOTE — Progress Notes (Signed)
  Subjective:    Patient ID: Deanna Schwartz, female    DOB: 10/06/1980, 32 y.o.   MRN: [id number]  HPI  Deanna Schwartz continues to have intermittent left upper quadrant pain. Over the weekend she had a severe spell. She had vomiting on Sunday as well. She denies any injury. One week out of the month she has constipation which is treated with MiraLax, seems to be related to her cycle.  Sometimes when she sits it bothers her as well. she had been diagnosed with recurrent B12 deficiency this summer. She took for weekly injections of vitamin B12 and then has 1 vial left, she has not yet had an injection supplement since. She does have oral vitamin B12 at home that I think she takes sometimes. She is taking iron supplement. She has 7 day heavy periods still.  There is some situational stress at home, she is working part-time now so interacts with her mother-in-law more than she used to and she thinks she has that straightened out but it has been a source of stress for her. She denies any overt back pain though the pain in her left side does radiate around to the back.  Review of Systems     Objective:   Physical Exam Abdomen is soft, she is tender in the left upper quadrant on the costal margin, left lateral rib line and with muscle tension in this area.       Assessment & Plan:

## 2013-06-30 NOTE — Patient Instructions (Addendum)
Your physician has requested that you go to the basement for the following lab work before leaving today: Vitamin B-12, when we get the results we will let you know how often you will need your vitamin B-12 injections done.  Today we are giving you a Vitamin B12 injection with your own medicine.  Please take Aleve 2 tablets twice a day for a month.    Please use a sports cream on your Left upper quadrant area 2-3 times a day.   Please take a Vitamin B12,  supplement every day.  I appreciate the opportunity to care for you.

## 2013-06-30 NOTE — Assessment & Plan Note (Addendum)
Recheck level 1000ug today im/sq Oral 1000 ug daily

## 2013-07-01 ENCOUNTER — Other Ambulatory Visit: Payer: Self-pay | Admitting: Internal Medicine

## 2013-08-26 ENCOUNTER — Other Ambulatory Visit: Payer: Self-pay

## 2013-08-26 DIAGNOSIS — D649 Anemia, unspecified: Secondary | ICD-10-CM

## 2013-08-29 ENCOUNTER — Other Ambulatory Visit (INDEPENDENT_AMBULATORY_CARE_PROVIDER_SITE_OTHER): Payer: Self-pay

## 2013-08-29 DIAGNOSIS — D649 Anemia, unspecified: Secondary | ICD-10-CM

## 2013-08-29 LAB — CBC WITH DIFFERENTIAL/PLATELET
Basophils Relative: 0.3 % (ref 0.0–3.0)
Eosinophils Absolute: 0.8 10*3/uL — ABNORMAL HIGH (ref 0.0–0.7)
Eosinophils Relative: 10 % — ABNORMAL HIGH (ref 0.0–5.0)
Lymphocytes Relative: 31.6 % (ref 12.0–46.0)
Monocytes Relative: 7.5 % (ref 3.0–12.0)
Neutrophils Relative %: 50.6 % (ref 43.0–77.0)
Platelets: 282 10*3/uL (ref 150.0–400.0)
RBC: 4.52 Mil/uL (ref 3.87–5.11)
WBC: 7.7 10*3/uL (ref 4.5–10.5)

## 2013-08-29 LAB — VITAMIN B12: Vitamin B-12: 430 pg/mL (ref 211–911)

## 2013-08-29 LAB — FERRITIN: Ferritin: 6.1 ng/mL — ABNORMAL LOW (ref 10.0–291.0)

## 2013-08-30 NOTE — Progress Notes (Signed)
Quick Note:  Very slight anemia and iron still low B12 ok Is she taking oral iron? Are periods heavy? ______

## 2013-09-13 NOTE — Progress Notes (Signed)
Quick Note:  Ask her what her ability to see GYN about the heavy periods is, please ______

## 2013-10-27 ENCOUNTER — Telehealth: Payer: Self-pay | Admitting: Internal Medicine

## 2013-10-27 NOTE — Telephone Encounter (Signed)
Weeks - months of Left mid abdominal pain radiating into back Recently restarted MiraLax for chronic constipation She is worried about the pain Neg Ct abd/pelvis 05/2013  Periods stable with 7 days of menses, no change  Asked her to retry Linzess 145 ug and take daily - and report back results of this trial - she still has it at home

## 2013-11-04 ENCOUNTER — Other Ambulatory Visit: Payer: Self-pay

## 2013-11-04 MED ORDER — ALBUTEROL SULFATE HFA 108 (90 BASE) MCG/ACT IN AERS: 2.0000 | INHALATION_SPRAY | Freq: Four times a day (QID) | RESPIRATORY_TRACT | Status: DC | PRN

## 2013-11-05 ENCOUNTER — Ambulatory Visit (INDEPENDENT_AMBULATORY_CARE_PROVIDER_SITE_OTHER): Payer: Self-pay | Admitting: Family Medicine

## 2013-11-05 ENCOUNTER — Ambulatory Visit: Payer: Self-pay | Admitting: Internal Medicine

## 2013-11-05 ENCOUNTER — Encounter: Payer: Self-pay | Admitting: Family Medicine

## 2013-11-05 VITALS — BP 108/72 | HR 90 | Temp 97.6°F | Ht 66.0 in | Wt 211.0 lb

## 2013-11-05 DIAGNOSIS — J45901 Unspecified asthma with (acute) exacerbation: Secondary | ICD-10-CM

## 2013-11-05 DIAGNOSIS — J019 Acute sinusitis, unspecified: Secondary | ICD-10-CM

## 2013-11-05 MED ORDER — FLUCONAZOLE 150 MG PO TABS: 150.0000 mg | ORAL_TABLET | Freq: Once | ORAL | Status: DC

## 2013-11-05 MED ORDER — AZITHROMYCIN 250 MG PO TABS: ORAL_TABLET | ORAL | Status: DC

## 2013-11-05 MED ORDER — PREDNISONE 10 MG PO TABS: ORAL_TABLET | ORAL | Status: DC

## 2013-11-05 NOTE — Progress Notes (Signed)
Subjective:    Patient ID: Deanna Schwartz, female    DOB: December 17, 1980, 33 y.o.   MRN: [id number]  HPI Here with uri symptoms for 1 week Wheezing is getting worse  Using her inhaler every 4-6 hours Non prod cough but ratting in chest  Congestion and facial pain also - has a hx of sinus infx and sinus surgery  No fever   Has a child at home with the flu  She herself does not feel like she has the flu   Patient Active Problem List   Diagnosis Date Noted  . Abdominal pain, left upper quadrant-musculoskeletal 05/13/2013  . Iron deficiency anemia 08/06/2011  . B12 deficiency with anti-parietal cell antibodies + 03/25/2011  . Arthralgia 03/24/2011  . INSOMNIA, CHRONIC 10/11/2010  . INTRINSIC ASTHMA, WITH EXACERBATION 09/04/2010  . IBS 04/07/2008  . PALPITATIONS 01/11/2008  . ANXIETY 11/25/2007  . ALLERGIC RHINITIS 11/25/2007  . GERD 11/25/2007  . SINUSITIS, CHRONIC 04/03/2007  . ASTHMA 04/03/2007   Past Medical History  Diagnosis Date  . Asthma   . Anxiety   . GERD (gastroesophageal reflux disease)   . Allergic rhinitis   . Chronic constipation   . Anemia   . H. pylori infection 2008    Hx of tx + serology  . Panic attack   . Irritable bowel syndrome   . Obesity   . 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 Iron deficiency anemia 08/06/2011    Hgb 10.2 and MCV 79 on health screen labs    Past Surgical History  Procedure Laterality Date  . Nasal sinus surgery    . Flexible sigmoidoscopy  04/14/2008    normal  . Upper gastrointestinal endoscopy     History  Substance Use Topics  . Smoking status: Never Smoker   . Smokeless tobacco: Never Used  . Alcohol Use: Yes     Comment: Social maybe twice amonth, beer   Family History  Problem Relation Age of Onset  . Diabetes      grandmother  . Hypertension    . Colon cancer Neg Hx   . Hypertension Mother   . Hypertension Father   . Diabetes Maternal Aunt   . Diabetes  Maternal Aunt    Allergies  Allergen Reactions  . Augmentin [Amoxicillin-Pot Clavulanate]   . Cephalexin     REACTION: nausea  . Iodine Swelling    Throat swelling   Current Outpatient Prescriptions on File Prior to Visit  Medication Sig Dispense Refill  . albuterol (PROVENTIL HFA;VENTOLIN HFA) 108 (90 BASE) MCG/ACT inhaler Inhale 2 puffs into the lungs every 6 (six) hours as needed for wheezing.  1 Inhaler  5  . ferrous gluconate (FERGON) 216 MG tablet Take 1 tablet (216 mg total) by mouth 2 (two) times daily with a meal.  60 tablet  3  . iron polysaccharides (NIFEREX) 150 MG capsule Take 150 mg by mouth 2 (two) times daily.      . polyethylene glycol powder (GLYCOLAX/MIRALAX) powder MIX 17 GRAMS (ONE CAPFUL) WITH 4 TO 6 OUNCES OF WATER OR JUICE AND DRINK DAILY  527 g  5  . Spacer/Aero-Holding [facility]) MISC 1 each by Other route once.  1 each  0  . traMADol (ULTRAM) 50 MG tablet Take 1 tablet (50 mg total) by mouth every 6 (six) hours as needed for pain.  30 tablet  0  . vitamin B-12 (CYANOCOBALAMIN) 1000 MCG tablet Take 1  tablet (1,000 mcg total) by mouth daily.      [name] dexlansoprazole (DEXILANT) 60 MG capsule Take 60 mg by mouth daily.       No current facility-administered medications on file prior to visit.    Review of Systems Review of Systems  Constitutional: Negative for fever, appetite change,  and unexpected weight change.  ENT pos for congestion/ facial pressure/st and ear pressure  Eyes: Negative for pain and visual disturbance.  Respiratory: pos for cough/ wheeze/sob with hx of asthma  Cardiovascular: Negative for cp or palpitations    Gastrointestinal: Negative for nausea, diarrhea and constipation.  Genitourinary: Negative for urgency and frequency.  Skin: Negative for pallor or rash   Neurological: Negative for weakness, light-headedness, numbness and headaches.  Hematological: Negative for adenopathy. Does not bruise/bleed easily.    Psychiatric/Behavioral: Negative for dysphoric mood. The patient is not nervous/anxious.         Objective:   Physical Exam  Constitutional: She appears well-developed and well-nourished. No distress.  obese and well appearing   HENT:  Head: Normocephalic and atraumatic.  Right Ear: External ear normal.  Left Ear: External ear normal.  Mouth/Throat: Oropharynx is clear and moist. No oropharyngeal exudate.  Nares are injected and congested  Bilateral maxillary sinus tenderness     Eyes: Conjunctivae and EOM are normal. Pupils are equal, round, and reactive to light. Right eye exhibits no discharge. Left eye exhibits no discharge.  Neck: Normal range of motion. Neck supple.  Cardiovascular: Normal rate and regular rhythm.   Pulmonary/Chest: Effort normal. No respiratory distress. She has wheezes. She has no rales. She exhibits no tenderness.  Harsh bs and wheeze throughout [place] air exch  No rales Some scattered rhonchi   Lymphadenopathy:    She has no cervical adenopathy.  Neurological: She is alert.  Skin: Skin is warm and dry. No rash noted.  Psychiatric: She has a normal mood and affect.          Assessment & Plan:

## 2013-11-05 NOTE — Patient Instructions (Signed)
Take zithromax for sinus infection  Take diflucan if you get a yeast infection  Continue inhaler as needed Take the prednisone taper as directed  Drink fluids and get rest  Update if not starting to improve in a week or if worsening

## 2013-11-06 NOTE — Assessment & Plan Note (Signed)
With uri and also sinusitis Continue albuterol mdi  Prednisone taper 30 mg -disc side eff and way to take it  tx sinusitis with zithromax

## 2013-11-06 NOTE — Assessment & Plan Note (Signed)
tx with zithromax (pt has allergies to other abx )  Disc symptomatic care - see instructions on AVS  Update if not starting to improve in a week or if worsening  Also prednisone for asthma exac-may also help sinus symptoms

## 2013-11-18 ENCOUNTER — Encounter: Payer: Self-pay | Admitting: Internal Medicine

## 2013-11-18 ENCOUNTER — Other Ambulatory Visit (INDEPENDENT_AMBULATORY_CARE_PROVIDER_SITE_OTHER): Payer: Self-pay

## 2013-11-18 ENCOUNTER — Ambulatory Visit (INDEPENDENT_AMBULATORY_CARE_PROVIDER_SITE_OTHER): Payer: Self-pay | Admitting: Internal Medicine

## 2013-11-18 VITALS — BP 100/78 | HR 94 | Temp 98.0°F | Ht 63.0 in | Wt 213.0 lb

## 2013-11-18 DIAGNOSIS — D509 Iron deficiency anemia, unspecified: Secondary | ICD-10-CM

## 2013-11-18 DIAGNOSIS — R635 Abnormal weight gain: Secondary | ICD-10-CM

## 2013-11-18 DIAGNOSIS — E538 Deficiency of other specified B group vitamins: Secondary | ICD-10-CM

## 2013-11-18 LAB — BASIC METABOLIC PANEL
BUN: 13 mg/dL (ref 6–23)
CO2: 28 mEq/L (ref 19–32)
Calcium: 9.1 mg/dL (ref 8.4–10.5)
Chloride: 105 mEq/L (ref 96–112)
Creatinine, Ser: 0.8 mg/dL (ref 0.4–1.2)
GFR: 110.24 mL/min (ref >60.00)
GLUCOSE: 89 mg/dL (ref 70–99)
Potassium: 4.3 mEq/L (ref 3.5–5.1)
SODIUM: 138 meq/L (ref 135–145)

## 2013-11-18 LAB — HEPATIC FUNCTION PANEL
ALBUMIN: 3.8 g/dL (ref 3.5–5.2)
ALT: 14 U/L (ref 0–35)
AST: 20 U/L (ref 0–37)
Alkaline Phosphatase: 58 U/L (ref 39–117)
BILIRUBIN TOTAL: 0.6 mg/dL (ref 0.3–1.2)
Bilirubin, Direct: 0 mg/dL (ref 0.0–0.3)
Total Protein: 7.3 g/dL (ref 6.0–8.3)

## 2013-11-18 LAB — CBC WITH DIFFERENTIAL/PLATELET
Basophils Absolute: 0 10*3/uL (ref 0.0–0.1)
Basophils Relative: 0.3 % (ref 0.0–3.0)
EOS PCT: 8.9 % — AB (ref 0.0–5.0)
Eosinophils Absolute: 0.7 10*3/uL (ref 0.0–0.7)
HEMATOCRIT: 33.9 % — AB (ref 36.0–46.0)
Hemoglobin: 10.9 g/dL — ABNORMAL LOW (ref 12.0–15.0)
LYMPHS ABS: 1.9 10*3/uL (ref 0.7–4.0)
Lymphocytes Relative: 22.9 % (ref 12.0–46.0)
MCHC: 32.2 g/dL (ref 30.0–36.0)
MCV: 76.5 fl — AB (ref 78.0–100.0)
MONO ABS: 0.4 10*3/uL (ref 0.1–1.0)
Monocytes Relative: 5.2 % (ref 3.0–12.0)
Neutro Abs: 5.2 10*3/uL (ref 1.4–7.7)
Neutrophils Relative %: 62.7 % (ref 43.0–77.0)
PLATELETS: 264 10*3/uL (ref 150.0–400.0)
RBC: 4.44 Mil/uL (ref 3.87–5.11)
RDW: 15.7 % — ABNORMAL HIGH (ref 11.5–14.6)
WBC: 8.3 10*3/uL (ref 4.5–10.5)

## 2013-11-18 LAB — TSH: TSH: 1.35 u[IU]/mL (ref 0.35–5.50)

## 2013-11-18 LAB — LIPID PANEL
Cholesterol: 167 mg/dL (ref 0–200)
HDL: 51.4 mg/dL (ref >39.00)
LDL CALC: 95 mg/dL (ref 0–99)
Total CHOL/HDL Ratio: 3
Triglycerides: 102 mg/dL (ref 0.0–149.0)
VLDL: 20.4 mg/dL (ref 0.0–40.0)

## 2013-11-18 MED ORDER — ALBUTEROL SULFATE HFA 108 (90 BASE) MCG/ACT IN AERS: 2.0000 | INHALATION_SPRAY | Freq: Four times a day (QID) | RESPIRATORY_TRACT | Status: DC | PRN

## 2013-11-18 NOTE — Assessment & Plan Note (Signed)
Continue with current prescription therapy as reflected on the Med list.  

## 2013-11-18 NOTE — Progress Notes (Signed)
   Subjective:    Patient ID: Deanna Schwartz, female    DOB: 29-Oct-1980, 33 y.o.   MRN: [id number]  HPI  The patient is here for a wellness exam. The patient has been doing well overall without major physical or psychological issues going on lately. C/o wt gain  Review of Systems  Constitutional: Positive for unexpected weight change. Negative for chills, activity change, appetite change and fatigue.  HENT: Negative for congestion, mouth sores and sinus pressure.   Eyes: Negative for visual disturbance.  Respiratory: Negative for cough and chest tightness.   Gastrointestinal: Negative for nausea and abdominal pain.  Genitourinary: Negative for frequency, difficulty urinating and vaginal pain.  Musculoskeletal: Negative for back pain and gait problem.  Skin: Negative for pallor and rash.  Neurological: Negative for dizziness, tremors, weakness, numbness and headaches.  Psychiatric/Behavioral: Negative for confusion and sleep disturbance.       Objective:   Physical Exam  Constitutional: She appears well-developed. No distress.  Obese  HENT:  Head: Normocephalic.  Right Ear: External ear normal.  Left Ear: External ear normal.  Nose: Nose normal.  Mouth/Throat: Oropharynx is clear and moist.  Eyes: Conjunctivae are normal. Pupils are equal, round, and reactive to light. Right eye exhibits no discharge. Left eye exhibits no discharge.  Neck: Normal range of motion. Neck supple. No JVD present. No tracheal deviation present. No thyromegaly present.  Cardiovascular: Normal rate, regular rhythm and normal heart sounds.   Pulmonary/Chest: No stridor. No respiratory distress. She has no wheezes.  Abdominal: Soft. Bowel sounds are normal. She exhibits no distension and no mass. There is no tenderness. There is no rebound and no guarding.  Musculoskeletal: She exhibits no edema and no tenderness.  Lymphadenopathy:    She has no cervical adenopathy.  Neurological: She displays normal  reflexes. No cranial nerve deficit. She exhibits normal muscle tone. Coordination normal.  Skin: No rash noted. No erythema.  Psychiatric: She has a normal mood and affect. Her behavior is normal. Judgment and thought content normal.          Assessment & Plan:

## 2013-11-18 NOTE — Assessment & Plan Note (Addendum)
Wt Readings from Last 3 Encounters:  11/18/13 213 lb (96.616 kg)  11/05/13 211 lb (95.709 kg)  06/30/13 206 lb (93.441 kg)   Wt watchers or Dr Orvan Seen diet to try

## 2013-11-18 NOTE — Progress Notes (Deleted)
Pre visit review using our clinic review tool, if applicable. No additional management support is needed unless otherwise documented below in the visit note. 

## 2013-11-19 ENCOUNTER — Encounter (HOSPITAL_COMMUNITY): Payer: Self-pay | Admitting: Emergency Medicine

## 2013-11-19 ENCOUNTER — Emergency Department (HOSPITAL_COMMUNITY): Payer: Self-pay

## 2013-11-19 ENCOUNTER — Emergency Department (HOSPITAL_COMMUNITY)
Admission: EM | Admit: 2013-11-19 | Discharge: 2013-11-19 | Disposition: A | Discharge status: Home / Self Care | Payer: Self-pay | Attending: Emergency Medicine | Admitting: Emergency Medicine

## 2013-11-19 DIAGNOSIS — D509 Iron deficiency anemia, unspecified: Secondary | ICD-10-CM | POA: Insufficient documentation

## 2013-11-19 DIAGNOSIS — K219 Gastro-esophageal reflux disease without esophagitis: Secondary | ICD-10-CM | POA: Insufficient documentation

## 2013-11-19 DIAGNOSIS — E669 Obesity, unspecified: Secondary | ICD-10-CM | POA: Insufficient documentation

## 2013-11-19 DIAGNOSIS — Z8639 Personal history of other endocrine, nutritional and metabolic disease: Secondary | ICD-10-CM | POA: Insufficient documentation

## 2013-11-19 DIAGNOSIS — R509 Fever, unspecified: Secondary | ICD-10-CM | POA: Insufficient documentation

## 2013-11-19 DIAGNOSIS — Z8719 Personal history of other diseases of the digestive system: Secondary | ICD-10-CM | POA: Insufficient documentation

## 2013-11-19 DIAGNOSIS — Z8659 Personal history of other mental and behavioral disorders: Secondary | ICD-10-CM | POA: Insufficient documentation

## 2013-11-19 DIAGNOSIS — Z79899 Other long term (current) drug therapy: Secondary | ICD-10-CM | POA: Insufficient documentation

## 2013-11-19 DIAGNOSIS — Z8619 Personal history of other infectious and parasitic diseases: Secondary | ICD-10-CM | POA: Insufficient documentation

## 2013-11-19 DIAGNOSIS — Z9889 Other specified postprocedural states: Secondary | ICD-10-CM | POA: Insufficient documentation

## 2013-11-19 DIAGNOSIS — J45901 Unspecified asthma with (acute) exacerbation: Secondary | ICD-10-CM | POA: Insufficient documentation

## 2013-11-19 DIAGNOSIS — M549 Dorsalgia, unspecified: Secondary | ICD-10-CM | POA: Insufficient documentation

## 2013-11-19 LAB — CBC WITH DIFFERENTIAL/PLATELET
Basophils Absolute: 0 10*3/uL (ref 0.0–0.1)
Basophils Relative: 0 % (ref 0–1)
EOS ABS: 0.6 10*3/uL (ref 0.0–0.7)
Eosinophils Relative: 6 % — ABNORMAL HIGH (ref 0–5)
HCT: 36 % (ref 36.0–46.0)
Hemoglobin: 11.4 g/dL — ABNORMAL LOW (ref 12.0–15.0)
LYMPHS ABS: 1.9 10*3/uL (ref 0.7–4.0)
Lymphocytes Relative: 19 % (ref 12–46)
MCH: 24.7 pg — AB (ref 26.0–34.0)
MCHC: 31.7 g/dL (ref 30.0–36.0)
MCV: 78.1 fL (ref 78.0–100.0)
MONOS PCT: 6 % (ref 3–12)
Monocytes Absolute: 0.5 10*3/uL (ref 0.1–1.0)
NEUTROS ABS: 6.7 10*3/uL (ref 1.7–7.7)
NEUTROS PCT: 69 % (ref 43–77)
PLATELETS: 253 10*3/uL (ref 150–400)
RBC: 4.61 MIL/uL (ref 3.87–5.11)
RDW: 14.9 % (ref 11.5–15.5)
WBC: 9.6 10*3/uL (ref 4.0–10.5)

## 2013-11-19 LAB — BASIC METABOLIC PANEL
BUN: 8 mg/dL (ref 6–23)
CO2: 26 mEq/L (ref 19–32)
Calcium: 9.3 mg/dL (ref 8.4–10.5)
Chloride: 101 mEq/L (ref 96–112)
Creatinine, Ser: 0.71 mg/dL (ref 0.50–1.10)
GFR calc Af Amer: >90 mL/min (ref >90)
GLUCOSE: 90 mg/dL (ref 70–99)
POTASSIUM: 4 meq/L (ref 3.7–5.3)
SODIUM: 140 meq/L (ref 137–147)

## 2013-11-19 MED ORDER — METHYLPREDNISOLONE SODIUM SUCC 125 MG IJ SOLR
125.0000 mg | Freq: Once | INTRAMUSCULAR | Status: AC
  Administered 2013-11-19: 125 mg via INTRAVENOUS
  Filled 2013-11-19: qty 2

## 2013-11-19 MED ORDER — ALBUTEROL SULFATE (2.5 MG/3ML) 0.083% IN NEBU
5.0000 mg | INHALATION_SOLUTION | Freq: Once | RESPIRATORY_TRACT | Status: AC
  Administered 2013-11-19: 5 mg via RESPIRATORY_TRACT
  Filled 2013-11-19: qty 6

## 2013-11-19 MED ORDER — IPRATROPIUM BROMIDE 0.02 % IN SOLN
0.5000 mg | Freq: Once | RESPIRATORY_TRACT | Status: AC
  Administered 2013-11-19: 0.5 mg via RESPIRATORY_TRACT
  Filled 2013-11-19: qty 2.5

## 2013-11-19 MED ORDER — SODIUM CHLORIDE 0.9 % IV SOLN
Freq: Once | INTRAVENOUS | Status: AC
  Administered 2013-11-19: 20 mL via INTRAVENOUS

## 2013-11-19 MED ORDER — PREDNISONE 20 MG PO TABS: ORAL_TABLET | ORAL | Status: DC

## 2013-11-19 MED ORDER — HYDROCODONE-ACETAMINOPHEN 7.5-325 MG/15ML PO SOLN
15.0000 mL | Freq: Four times a day (QID) | ORAL | Status: DC | PRN
Start: 2013-11-19 — End: 2014-01-03

## 2013-11-19 NOTE — ED Notes (Addendum)
Pt. Ambulated with no assist. Pt. Stated while walking she felt light headed while walking. Pt. Oxygen level was 97% while pulse was up to 140-145. Nurses were notified.

## 2013-11-19 NOTE — ED Provider Notes (Signed)
Pt with asthma exacerbation who fail outpt treatment recently here with worsening wheezing and sob.  She has received one breathing treatment by previous provider.  Will continue to monitor and reassess.  If no improvement, will consider admission for asthma exacerbation.    4:31 PM Patient reports minimal improvement after her first breathing treatment. On examination, patient has diffuse rhonchi with occasional wheezes. She also has URI symptoms. We'll continue with breathing treatments and. Patient desired to go home if her symptoms improved.  6:29 PM Patient felt better after receiving second dose of breathing treatment. When ambulating pt maintained O2 at 98% on room however she became tachycardic with heart rate in the 140s and complaining of lightheadedness. Patient however has no significant risk factor for PE including no prior history of PE or DVT, no recent surgery, no prolonged bed rest, not taking any birth control pill, no history of cancer, and no unilateral leg swelling or calf pain. We'll continue giving IV fluid and will recheck vital signs. Patient expressed desire to go home.  7:26 PM Pt felt better, VS improves.  Stable for discharge.    BP 142/72  Pulse 117  Temp(Src) 98.5 F (36.9 C) (Oral)  Resp 19  Wt 211 lb (95.709 kg)  SpO2 98%  LMP 11/17/2013 elevated heart rate likely 2/2 recent albuterol use  I have reviewed nursing notes and vital signs. I personally reviewed the imaging tests through PACS system  I reviewed available ER/hospitalization records thought the EMR  Results for orders placed during the hospital encounter of 11/19/13  CBC WITH DIFFERENTIAL      Result Value Ref Range   WBC 9.6  4.0 - 10.5 K/uL   RBC 4.61  3.87 - 5.11 MIL/uL   Hemoglobin 11.4 (*) 12.0 - 15.0 g/dL   HCT 36.0  36.0 - 46.0 %   MCV 78.1  78.0 - 100.0 fL   MCH 24.7 (*) 26.0 - 34.0 pg   MCHC 31.7  30.0 - 36.0 g/dL   RDW 14.9  11.5 - 15.5 %   Platelets 253  150 - 400 K/uL   Neutrophils Relative % 69  43 - 77 %   Neutro Abs 6.7  1.7 - 7.7 K/uL   Lymphocytes Relative 19  12 - 46 %   Lymphs Abs 1.9  0.7 - 4.0 K/uL   Monocytes Relative 6  3 - 12 %   Monocytes Absolute 0.5  0.1 - 1.0 K/uL   Eosinophils Relative 6 (*) 0 - 5 %   Eosinophils Absolute 0.6  0.0 - 0.7 K/uL   Basophils Relative 0  0 - 1 %   Basophils Absolute 0.0  0.0 - 0.1 K/uL  BASIC METABOLIC PANEL      Result Value Ref Range   Sodium 140  137 - 147 mEq/L   Potassium 4.0  3.7 - 5.3 mEq/L   Chloride 101  96 - 112 mEq/L   CO2 26  19 - 32 mEq/L   Glucose, Bld 90  70 - 99 mg/dL   BUN 8  6 - 23 mg/dL   Creatinine, Ser 0.71  0.50 - 1.10 mg/dL   Calcium 9.3  8.4 - 10.5 mg/dL   GFR calc non Af Amer >90  >90 mL/min   GFR calc Af Amer >90  >90 mL/min   Dg Chest 2 View  11/19/2013   CLINICAL DATA:  Wheezing and fever  EXAM: CHEST  2 VIEW  COMPARISON:  Jan 13, 2012  FINDINGS:  Lungs are clear. Heart size and pulmonary vascularity are normal. No adenopathy. There is mild mid thoracic levoscoliosis.  IMPRESSION: No edema or consolidation.   Electronically Signed   By: Lowella Grip M.D.   On: 11/19/2013 14:34      Domenic Moras, PA-C 11/19/13 1927  Domenic Moras, PA-C 11/19/13 2041

## 2013-11-19 NOTE — ED Provider Notes (Signed)
Medical screening examination/treatment/procedure(s) were performed by non-physician practitioner and as supervising physician I was immediately available for consultation/collaboration.   EKG Interpretation None        Wandra Arthurs, MD 11/19/13 2316

## 2013-11-19 NOTE — ED Provider Notes (Signed)
CSN: [id number]     Arrival date & time 11/19/13  1331 History   First MD Initiated Contact with Patient 11/19/13 1347     Chief Complaint  Patient presents with  . Shortness of Breath  . Fever  . Wheezing     (Consider location/radiation/quality/duration/timing/severity/associated sxs/prior Treatment) Patient is a 33 y.o. female presenting with shortness of breath, fever, and wheezing. The history is provided by the patient. No language interpreter was used.  Shortness of Breath Severity:  Moderate Associated symptoms: fever and wheezing   Associated symptoms: no rash and no sore throat   Associated symptoms comment:  She presents with marked shortness of breath and wheezing progressive over the last 3-4 days, today with fever to 102 at home. She states she was treated for asthma exacerbation with steroid regimen that ended 4 days ago. Since then, her SOB and wheezing recurred and have been worsening. No vomiting. She reports a history of previous hospitalization secondary to asthma without history of intubation. Fever Associated symptoms: no congestion, no dysuria, no rash and no sore throat   Wheezing Associated symptoms: fever and shortness of breath   Associated symptoms: no rash and no sore throat     Past Medical History  Diagnosis Date  . Asthma   . Anxiety   . GERD (gastroesophageal reflux disease)   . Allergic rhinitis   . Chronic constipation   . Anemia   . H. pylori infection 2008    Hx of tx + serology  . Panic attack   . Irritable bowel syndrome   . Obesity   . 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 Iron deficiency anemia 08/06/2011    Hgb 10.2 and MCV 79 on health screen labs    Past Surgical History  Procedure Laterality Date  . Nasal sinus surgery    . Flexible sigmoidoscopy  04/14/2008    normal  . Upper gastrointestinal endoscopy     Family History  Problem Relation Age of Onset  . Diabetes      grandmother   . Hypertension    . Colon cancer Neg Hx   . Hypertension Mother   . Hypertension Father   . Diabetes Maternal Aunt   . Diabetes Maternal Aunt    History  Substance Use Topics  . Smoking status: Never Smoker   . Smokeless tobacco: Never Used  . Alcohol Use: Yes     Comment: Social maybe twice amonth, beer   OB History   Grav Para Term Preterm Abortions TAB SAB Ect Mult Living                 Review of Systems  Constitutional: Positive for fever.  HENT: Negative for congestion and sore throat.   Respiratory: Positive for shortness of breath and wheezing.   Genitourinary: Negative for dysuria.  Musculoskeletal: Positive for back pain.  Skin: Negative for rash.      Allergies  Augmentin; Cephalexin; and Iodine  Home Medications   Current Outpatient Rx  Name  Route  Sig  Dispense  Refill  . albuterol (PROVENTIL HFA;VENTOLIN HFA) 108 (90 BASE) MCG/ACT inhaler   Inhalation   Inhale 2 puffs into the lungs every 6 (six) hours as needed for wheezing.   1 Inhaler   5   . Dexlansoprazole (DEXILANT) 30 MG capsule   Oral   Take 30 mg by mouth daily.         . ferrous  sulfate 325 (65 FE) MG tablet   Oral   Take 325 mg by mouth daily with breakfast.         . Phenylephrine-Pheniramine-DM (THERAFLU COLD & COUGH) 06-28-19 MG PACK   Oral   Take 1 packet by mouth once.         [name] Spacer/Aero-Holding [facility]) MISC   Other   1 each by Other route once.   1 each   0   . vitamin C (ASCORBIC ACID) 500 MG tablet   Oral   Take 500 mg by mouth daily.          BP 126/66  Pulse 114  Temp(Src) 98.6 F (37 C) (Oral)  Resp 22  Wt 211 lb (95.709 kg)  SpO2 98%  LMP 11/17/2013 Physical Exam  Constitutional: She is oriented to person, place, and time. She appears well-developed and well-nourished.  HENT:  Head: Normocephalic.  Neck: Normal range of motion. Neck supple.  Cardiovascular: Normal rate and regular rhythm.   Pulmonary/Chest: Effort normal.  She has wheezes. She exhibits no tenderness.  Abdominal: Soft. Bowel sounds are normal. There is no tenderness. There is no rebound and no guarding.  Musculoskeletal: Normal range of motion.  Neurological: She is alert and oriented to person, place, and time.  Skin: Skin is warm and dry. No rash noted.  Psychiatric: She has a normal mood and affect.    ED Course  Procedures (including critical care time) Labs Review Labs Reviewed  CBC WITH DIFFERENTIAL  BASIC METABOLIC PANEL   Imaging Review Dg Chest 2 View  11/19/2013   CLINICAL DATA:  Wheezing and fever  EXAM: CHEST  2 VIEW  COMPARISON:  Jan 13, 2012  FINDINGS: Lungs are clear. Heart size and pulmonary vascularity are normal. No adenopathy. There is mild mid thoracic levoscoliosis.  IMPRESSION: No edema or consolidation.   Electronically Signed   By: Lowella Grip M.D.   On: 11/19/2013 14:34     EKG Interpretation None      MDM   Final diagnoses:  None    1. Asthma 2. Febrile illness.   She is currently getting a duoneb. CXR negative. Patient care transferred to Domenic Moras, PA-C for reassessment and disposition.    Dewaine Oats, PA-C 11/19/13 1525

## 2013-11-19 NOTE — ED Notes (Signed)
She c/o cough, shortness of breath and wheezing since yesterday.  She is mod. Short of breath and in no distress.

## 2013-11-19 NOTE — ED Provider Notes (Signed)
Medical screening examination/treatment/procedure(s) were performed by non-physician practitioner and as supervising physician I was immediately available for consultation/collaboration.    Kathalene Frames, MD 11/19/13 [phone]

## 2013-11-19 NOTE — Discharge Instructions (Signed)
Asthma, Acute Bronchospasm °Acute bronchospasm caused by asthma is also referred to as an asthma attack. Bronchospasm means your air passages become narrowed. The narrowing is caused by inflammation and tightening of the muscles in the air tubes (bronchi) in your lungs. This can make it hard to breath or cause you to wheeze and cough. °CAUSES °Possible triggers are: °· Animal dander from the skin, hair, or feathers of animals. °· Dust mites contained in house dust. °· Cockroaches. °· Pollen from trees or grass. °· Mold. °· Cigarette or tobacco smoke. °· Air pollutants such as dust, household cleaners, hair sprays, aerosol sprays, paint fumes, strong chemicals, or strong odors. °· Cold air or weather changes. Cold air may trigger inflammation. Winds increase molds and pollens in the air. °· Strong emotions such as crying or laughing hard. °· Stress. °· Certain medicines such as aspirin or beta-blockers. °· Sulfites in foods and drinks, such as dried fruits and wine. °· Infections or inflammatory conditions, such as a flu, cold, or inflammation of the nasal membranes (rhinitis). °· Gastroesophageal reflux disease (GERD). GERD is a condition where stomach acid backs up into your throat (esophagus). °· Exercise or strenuous activity. °SIGNS AND SYMPTOMS  °· Wheezing. °· Excessive coughing, particularly at night. °· Chest tightness. °· Shortness of breath. °DIAGNOSIS  °Your health care provider will ask you about your medical history and perform a physical exam. A chest X-ray or blood testing may be performed to look for other causes of your symptoms or other conditions that may have triggered your asthma attack.  °TREATMENT  °Treatment is aimed at reducing inflammation and opening up the airways in your lungs.  Most asthma attacks are treated with inhaled medicines. These include quick relief or rescue medicines (such as bronchodilators) and controller medicines (such as inhaled corticosteroids). These medicines are  sometimes given through an inhaler or a nebulizer. Systemic steroid medicine taken by mouth or given through an IV tube also can be used to reduce the inflammation when an attack is moderate or severe. Antibiotic medicines are only used if a bacterial infection is present.  °HOME CARE INSTRUCTIONS  °· Rest. °· Drink plenty of liquids. This helps the mucus to remain thin and be easily coughed up. Only use caffeine in moderation and do not use alcohol until you have recovered from your illness. °· Do not smoke. Avoid being exposed to secondhand smoke. °· You play a critical role in keeping yourself in good health. Avoid exposure to things that cause you to wheeze or to have breathing problems. °· Keep your medicines up to date and available. Carefully follow your health care provider's treatment plan. °· Take your medicine exactly as prescribed. °· When pollen or pollution is bad, keep windows closed and use an air conditioner or go to places with air conditioning. °· Asthma requires careful medical care. See your health care provider for a follow-up as advised. If you are more than [redacted] weeks pregnant and you were prescribed any new medicines, let your obstetrician know about the visit and how you are doing. Follow-up with your health care provider as directed. °· After you have recovered from your asthma attack, make an appointment with your outpatient doctor to talk about ways to reduce the likelihood of future attacks. If you do not have a doctor who manages your asthma, make an appointment with a primary care doctor to discuss your asthma. °SEEK IMMEDIATE MEDICAL CARE IF:  °· You are getting worse. °· You have trouble breathing. If severe, call   your local emergency services (911 in the [place] °· You develop chest pain or discomfort. °· You are vomiting. °· You are not able to keep fluids down. °· You are coughing up yellow, green, brown, or bloody sputum. °· You have a fever and your symptoms suddenly get  worse. °· You have trouble swallowing. °MAKE SURE YOU:  °· Understand these instructions. °· Will watch your condition. °· Will get help right away if you are not doing well or get worse. °Document Released: 12/10/2006 Document Revised: 04/27/2013 Document Reviewed: 03/02/2013 °[employer]® Patient Information ©2014 [employer]. ° °

## 2013-11-19 NOTE — ED Notes (Signed)
Patient states that she feels fine and is ready to go home

## 2013-11-22 ENCOUNTER — Telehealth: Payer: Self-pay | Admitting: *Deleted

## 2013-11-22 NOTE — Telephone Encounter (Signed)
Pt called requesting lab results from her 3.13.15 Ov with Dr Alain Marion

## 2013-11-23 NOTE — Telephone Encounter (Signed)
pls call the lab: labs are not released. Why? Thx

## 2013-11-23 NOTE — Telephone Encounter (Signed)
Spoke with [place] in the lab.  The urine was not collected on 3.13.15 that's why the results were not in.  However Cathy deleted the urine order.  Results in now.  Please advise on results

## 2013-11-24 NOTE — Telephone Encounter (Signed)
Mild anemia. Pls cont w/iron Thx

## 2013-11-24 NOTE — Telephone Encounter (Signed)
Spoke with pt advised of MDs result note.

## 2013-12-06 ENCOUNTER — Telehealth: Payer: Self-pay | Admitting: Internal Medicine

## 2013-12-06 NOTE — Telephone Encounter (Signed)
Had pelvic US at Dr. Nena Alexander office today Still having lower abd pain radiating to back Stools pasty the past week  Wondering what is next  Plan: Await [place] results If those are not helpful will schedule colonoscopy

## 2013-12-13 ENCOUNTER — Telehealth: Payer: Self-pay | Admitting: *Deleted

## 2013-12-13 NOTE — Telephone Encounter (Signed)
Pt called states she is still having left side abd pain radiating to back,  Pt states she discussed this at her last OV.  i told pt she would need an OV but she insisted I send a message first.  Please advise

## 2013-12-14 NOTE — Telephone Encounter (Signed)
I'm not sure. Pelvic US was nl. Dr Carlean Purl suggested to sch a colonoscopy w/him - was it scheduled? Check a UA. I can see Jessenya if needed. Thx

## 2013-12-15 NOTE — Telephone Encounter (Signed)
Spoke with pt advised of MDs message 

## 2014-01-03 ENCOUNTER — Emergency Department (HOSPITAL_COMMUNITY): Payer: Self-pay

## 2014-01-03 ENCOUNTER — Encounter (HOSPITAL_COMMUNITY): Payer: Self-pay | Admitting: Emergency Medicine

## 2014-01-03 ENCOUNTER — Emergency Department (HOSPITAL_COMMUNITY)
Admission: EM | Admit: 2014-01-03 | Discharge: 2014-01-04 | Disposition: A | Discharge status: Home / Self Care | Payer: Self-pay | Attending: Emergency Medicine | Admitting: Emergency Medicine

## 2014-01-03 DIAGNOSIS — D509 Iron deficiency anemia, unspecified: Secondary | ICD-10-CM | POA: Insufficient documentation

## 2014-01-03 DIAGNOSIS — E669 Obesity, unspecified: Secondary | ICD-10-CM | POA: Insufficient documentation

## 2014-01-03 DIAGNOSIS — J45909 Unspecified asthma, uncomplicated: Secondary | ICD-10-CM | POA: Insufficient documentation

## 2014-01-03 DIAGNOSIS — Z3202 Encounter for pregnancy test, result negative: Secondary | ICD-10-CM | POA: Insufficient documentation

## 2014-01-03 DIAGNOSIS — Z79899 Other long term (current) drug therapy: Secondary | ICD-10-CM | POA: Insufficient documentation

## 2014-01-03 DIAGNOSIS — Z8659 Personal history of other mental and behavioral disorders: Secondary | ICD-10-CM | POA: Insufficient documentation

## 2014-01-03 DIAGNOSIS — N390 Urinary tract infection, site not specified: Secondary | ICD-10-CM

## 2014-01-03 DIAGNOSIS — Z9889 Other specified postprocedural states: Secondary | ICD-10-CM | POA: Insufficient documentation

## 2014-01-03 DIAGNOSIS — K219 Gastro-esophageal reflux disease without esophagitis: Secondary | ICD-10-CM | POA: Insufficient documentation

## 2014-01-03 DIAGNOSIS — R109 Unspecified abdominal pain: Secondary | ICD-10-CM

## 2014-01-03 DIAGNOSIS — Z8619 Personal history of other infectious and parasitic diseases: Secondary | ICD-10-CM | POA: Insufficient documentation

## 2014-01-03 LAB — CBC WITH DIFFERENTIAL/PLATELET
Basophils Absolute: 0 10*3/uL (ref 0.0–0.1)
Basophils Relative: 0 % (ref 0–1)
Eosinophils Absolute: 1.1 10*3/uL — ABNORMAL HIGH (ref 0.0–0.7)
Eosinophils Relative: 10 % — ABNORMAL HIGH (ref 0–5)
HCT: 35.4 % — ABNORMAL LOW (ref 36.0–46.0)
Hemoglobin: 11.3 g/dL — ABNORMAL LOW (ref 12.0–15.0)
Lymphocytes Relative: 27 % (ref 12–46)
Lymphs Abs: 2.8 10*3/uL (ref 0.7–4.0)
MCH: 25 pg — ABNORMAL LOW (ref 26.0–34.0)
MCHC: 31.9 g/dL (ref 30.0–36.0)
MCV: 78.3 fL (ref 78.0–100.0)
Monocytes Absolute: 0.5 10*3/uL (ref 0.1–1.0)
Monocytes Relative: 4 % (ref 3–12)
Neutro Abs: 6.1 10*3/uL (ref 1.7–7.7)
Neutrophils Relative %: 58 % (ref 43–77)
Platelets: 295 10*3/uL (ref 150–400)
RBC: 4.52 MIL/uL (ref 3.87–5.11)
RDW: 14.9 % (ref 11.5–15.5)
WBC: 10.5 10*3/uL (ref 4.0–10.5)

## 2014-01-03 LAB — COMPREHENSIVE METABOLIC PANEL
ALT: 13 U/L (ref 0–35)
AST: 21 U/L (ref 0–37)
Albumin: 3.8 g/dL (ref 3.5–5.2)
Alkaline Phosphatase: 67 U/L (ref 39–117)
BUN: 19 mg/dL (ref 6–23)
CO2: 26 mEq/L (ref 19–32)
Calcium: 9.6 mg/dL (ref 8.4–10.5)
Chloride: 100 mEq/L (ref 96–112)
Creatinine, Ser: 0.73 mg/dL (ref 0.50–1.10)
GFR calc Af Amer: >90 mL/min (ref >90)
GFR calc non Af Amer: >90 mL/min (ref >90)
Glucose, Bld: 87 mg/dL (ref 70–99)
Potassium: 4.2 mEq/L (ref 3.7–5.3)
Sodium: 137 mEq/L (ref 137–147)
Total Bilirubin: <0.2 mg/dL — ABNORMAL LOW (ref 0.3–1.2)
Total Protein: 7.7 g/dL (ref 6.0–8.3)

## 2014-01-03 LAB — URINALYSIS, ROUTINE W REFLEX MICROSCOPIC
Bilirubin Urine: NEGATIVE
Glucose, UA: NEGATIVE mg/dL
Hgb urine dipstick: NEGATIVE
Ketones, ur: NEGATIVE mg/dL
Nitrite: NEGATIVE
Protein, ur: NEGATIVE mg/dL
Specific Gravity, Urine: 1.017 (ref 1.005–1.030)
Urobilinogen, UA: 0.2 mg/dL (ref 0.0–1.0)
pH: 6 (ref 5.0–8.0)

## 2014-01-03 LAB — LIPASE, BLOOD: Lipase: 50 U/L (ref 11–59)

## 2014-01-03 LAB — POC URINE PREG, ED: Preg Test, Ur: NEGATIVE

## 2014-01-03 LAB — URINE MICROSCOPIC-ADD ON

## 2014-01-03 LAB — I-STAT TROPONIN, ED: Troponin i, poc: 0 ng/mL (ref 0.00–0.08)

## 2014-01-03 MED ORDER — HYDROMORPHONE HCL PF 1 MG/ML IJ SOLN
1.0000 mg | Freq: Once | INTRAMUSCULAR | Status: AC
  Administered 2014-01-03: 1 mg via INTRAVENOUS
  Filled 2014-01-03: qty 1

## 2014-01-03 MED ORDER — ONDANSETRON HCL 4 MG/2ML IJ SOLN
4.0000 mg | Freq: Once | INTRAMUSCULAR | Status: AC
  Administered 2014-01-03: 4 mg via INTRAVENOUS
  Filled 2014-01-03: qty 2

## 2014-01-03 MED ORDER — SODIUM CHLORIDE 0.9 % IV BOLUS (SEPSIS)
1000.0000 mL | Freq: Once | INTRAVENOUS | Status: AC
  Administered 2014-01-03: 1000 mL via INTRAVENOUS

## 2014-01-03 MED ORDER — ONDANSETRON 8 MG PO TBDP
8.0000 mg | ORAL_TABLET | Freq: Once | ORAL | Status: AC
  Administered 2014-01-03: 8 mg via ORAL
  Filled 2014-01-03: qty 1

## 2014-01-03 MED ORDER — CIPROFLOXACIN IN D5W 400 MG/200ML IV SOLN
400.0000 mg | Freq: Once | INTRAVENOUS | Status: AC
  Administered 2014-01-03: 400 mg via INTRAVENOUS
  Filled 2014-01-03: qty 200

## 2014-01-03 NOTE — ED Notes (Signed)
Pt c/o LUQ pain that radiates to her back that got intense in the past three hours but has been a dull pain for 3 months. Pt now c/o nauseated.

## 2014-01-03 NOTE — ED Provider Notes (Signed)
CSN: [id number]     Arrival date & time 01/03/14  1841 History   First MD Initiated Contact with Patient 01/03/14 2034     Chief Complaint  Patient presents with  . Abdominal Pain  . back pain      (Consider location/radiation/quality/duration/timing/severity/associated sxs/prior Treatment) HPI Patient presents to the emergency department with left flank pain that started about 3 hours prior to arrival.  The patient, states she has off and on pain for several months.  Patient, states, that she has chronic abdominal pain, but this is a sudden change in her usual pain.  Patient denies headache, blurred vision, weakness, dizziness, syncope, vomiting, diarrhea, dysuria, fever, or syncope patient also denies chest pain, or shortness of breath.  Patient, states she did not take any medications prior to arrival. Past Medical History  Diagnosis Date  . Asthma   . Anxiety   . GERD (gastroesophageal reflux disease)   . Allergic rhinitis   . Chronic constipation   . Anemia   . H. pylori infection 2008    Hx of tx + serology  . Panic attack   . Irritable bowel syndrome   . Obesity   . 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 Iron deficiency anemia 08/06/2011    Hgb 10.2 and MCV 79 on health screen labs    Past Surgical History  Procedure Laterality Date  . Nasal sinus surgery    . Flexible sigmoidoscopy  04/14/2008    normal  . Upper gastrointestinal endoscopy     Family History  Problem Relation Age of Onset  . Diabetes      grandmother  . Hypertension    . Colon cancer Neg Hx   . Hypertension Mother   . Hypertension Father   . Diabetes Maternal Aunt   . Diabetes Maternal Aunt    History  Substance Use Topics  . Smoking status: Never Smoker   . Smokeless tobacco: Never Used  . Alcohol Use: Yes     Comment: Social maybe twice amonth, beer   OB History   Grav Para Term Preterm Abortions TAB SAB Ect Mult Living                 Review  of Systems  All other systems negative except as documented in the HPI. All pertinent positives and negatives as reviewed in the HPI.   Allergies  Augmentin; Cephalexin; and Iodine  Home Medications   Prior to Admission medications   Medication Sig Start Date End Date Taking? Authorizing Provider  albuterol (PROVENTIL HFA;VENTOLIN HFA) 108 (90 BASE) MCG/ACT inhaler Inhale 2 puffs into the lungs every 6 (six) hours as needed for wheezing. 11/18/13  Yes Evie Lacks Plotnikov, MD  cetirizine (ZYRTEC) 10 MG tablet Take 10 mg by mouth daily.   Yes Historical Provider, MD  Dexlansoprazole (DEXILANT) 30 MG capsule Take 30 mg by mouth daily.   Yes Historical Provider, MD  ferrous sulfate 325 (65 FE) MG tablet Take 325 mg by mouth daily with breakfast.   Yes Historical Provider, MD  ibuprofen (ADVIL,MOTRIN) 200 MG tablet Take 400 mg by mouth every 6 (six) hours as needed for moderate pain.   Yes Historical Provider, MD   BP 122/70  Pulse 77  Temp(Src) 98.6 F (37 C) (Oral)  Resp 16  SpO2 100%  LMP 12/02/2013 Physical Exam  Nursing note and vitals reviewed. Constitutional: She is oriented to person, place, and time. She appears  well-developed and well-nourished. No distress.  HENT:  Head: Normocephalic and atraumatic.  Mouth/Throat: Oropharynx is clear and moist.  Eyes: Pupils are equal, round, and reactive to light.  Neck: Normal range of motion. Neck supple.  Cardiovascular: Normal rate, regular rhythm and normal heart sounds.  Exam reveals no gallop and no friction rub.   No murmur heard. Pulmonary/Chest: Effort normal and breath sounds normal.  Abdominal: Soft. Normal appearance and bowel sounds are normal. She exhibits no distension. There is tenderness. There is no rigidity, no rebound and no guarding.    Neurological: She is alert and oriented to person, place, and time. She exhibits normal muscle tone. Coordination normal.  Skin: Skin is warm and dry. No rash noted. No erythema.     ED Course  Procedures (including critical care time) Labs Review Labs Reviewed  COMPREHENSIVE METABOLIC PANEL - Abnormal; Notable for the following:    Total Bilirubin <0.2 (*)    All other components within normal limits  CBC WITH DIFFERENTIAL - Abnormal; Notable for the following:    Hemoglobin 11.3 (*)    HCT 35.4 (*)    MCH 25.0 (*)    Eosinophils Relative 10 (*)    Eosinophils Absolute 1.1 (*)    All other components within normal limits  URINALYSIS, ROUTINE W REFLEX MICROSCOPIC - Abnormal; Notable for the following:    Leukocytes, UA TRACE (*)    All other components within normal limits  URINE MICROSCOPIC-ADD ON - Abnormal; Notable for the following:    Bacteria, UA MANY (*)    All other components within normal limits  LIPASE, BLOOD  I-STAT TROPOININ, ED  POC URINE PREG, ED    Imaging Review Ct Abdomen Pelvis Wo Contrast  01/03/2014   CLINICAL DATA:  Left flank pain  EXAM: CT ABDOMEN AND PELVIS WITHOUT CONTRAST  TECHNIQUE: Multidetector CT imaging of the abdomen and pelvis was performed following the standard protocol without intravenous contrast.  COMPARISON:  Prior CT abdomen/ pelvis 05/23/2013  FINDINGS: Lower Chest: The lung bases are clear. Visualized cardiac structures are within normal limits for size. No pericardial effusion. Unremarkable visualized distal thoracic esophagus.  Abdomen: Unenhanced CT was performed per clinician order. Lack of IV contrast limits sensitivity and specificity, especially for evaluation of abdominal/pelvic solid viscera. Within these limitations, unremarkable CT appearance of the stomach, duodenum, spleen, the adrenal glands and pancreas. Normal hepatic contour and morphology. No discrete hepatic lesion. Gallbladder is unremarkable. No intra or extrahepatic biliary ductal dilatation.  Unremarkable appearance of the bilateral kidneys. No focal solid lesion, hydronephrosis or nephrolithiasis. No evidence of obstruction or focal bowel wall  thickening. Normal appendix in the right lower quadrant. The terminal ileum is unremarkable. No free fluid or suspicious adenopathy.  Pelvis: Unremarkable bladder, uterus and adnexa. No suspicious adenopathy.  Bones/Soft Tissues: No acute fracture or aggressive appearing lytic or blastic osseous lesion.  Vascular: Limited evaluation in the absence of intravenous contrast. No significant atherosclerotic vascular disease, aneurysmal dilatation or acute abnormality.  IMPRESSION: No acute abnormality in the abdomen or pelvis to explain the patient's clinical symptoms. Specifically, no evidence of nephro or ureterolithiasis.   Electronically Signed   By: Jacqulynn Cadet M.D.   On: 01/03/2014 22:06    Patient will be treated for UTI and told to return here as needed. The patient is advised to follow up with her PCP. Increase her fluid intake. The patient is feeling improved with fluids and pain medications.    Brent General, PA-C 01/04/14 0023

## 2014-01-04 MED ORDER — CIPROFLOXACIN HCL 500 MG PO TABS: 500.0000 mg | ORAL_TABLET | Freq: Two times a day (BID) | ORAL | Status: DC

## 2014-01-04 MED ORDER — HYDROCODONE-ACETAMINOPHEN 5-325 MG PO TABS: 1.0000 | ORAL_TABLET | Freq: Four times a day (QID) | ORAL | Status: DC | PRN

## 2014-01-04 MED ORDER — FLUCONAZOLE 200 MG PO TABS
200.0000 mg | ORAL_TABLET | Freq: Once | ORAL | Status: AC
  Administered 2014-01-04: 200 mg via ORAL
  Filled 2014-01-04: qty 1

## 2014-01-04 NOTE — ED Notes (Signed)
Pt still has 96mL of Cipro to infuse at this time

## 2014-01-04 NOTE — ED Notes (Signed)
Pt requested Diflucan at discharge, PA made aware and gave verbal order.

## 2014-01-04 NOTE — Discharge Instructions (Signed)
Return here as needed.  Followup with your primary care Dr. for recheck.  Increasee your fluid intake, rest as much possible

## 2014-01-05 NOTE — Telephone Encounter (Signed)
She was back in ED Is going to need an appointment to see me please - or APP when I am in office

## 2014-01-05 NOTE — Telephone Encounter (Signed)
Actually let us schedule her for a colonoscopy due to iron-deficiency anemia and abdominal pain

## 2014-01-05 NOTE — Telephone Encounter (Signed)
I spoke with Deanna Schwartz they have started her on cipro for a kidney infection.  She would like to wait to have colon until she finishes this and see if pain resolves.

## 2014-01-05 NOTE — ED Provider Notes (Signed)
Medical screening examination/treatment/procedure(s) were performed by non-physician practitioner and as supervising physician I was immediately available for consultation/collaboration.   EKG Interpretation None       Virgel Manifold, MD 01/05/14 1327

## 2014-01-05 NOTE — Telephone Encounter (Signed)
ok 

## 2014-01-25 ENCOUNTER — Other Ambulatory Visit (INDEPENDENT_AMBULATORY_CARE_PROVIDER_SITE_OTHER): Payer: Self-pay

## 2014-01-25 ENCOUNTER — Ambulatory Visit (INDEPENDENT_AMBULATORY_CARE_PROVIDER_SITE_OTHER): Payer: Self-pay | Admitting: Family Medicine

## 2014-01-25 ENCOUNTER — Encounter: Payer: Self-pay | Admitting: Family Medicine

## 2014-01-25 VITALS — BP 104/72 | HR 88 | Wt 212.0 lb

## 2014-01-25 DIAGNOSIS — IMO0002 Reserved for concepts with insufficient information to code with codable children: Secondary | ICD-10-CM

## 2014-01-25 DIAGNOSIS — S83249A Other tear of medial meniscus, current injury, unspecified knee, initial encounter: Secondary | ICD-10-CM | POA: Insufficient documentation

## 2014-01-25 DIAGNOSIS — M25562 Pain in left knee: Secondary | ICD-10-CM

## 2014-01-25 DIAGNOSIS — M25569 Pain in unspecified knee: Secondary | ICD-10-CM

## 2014-01-25 MED ORDER — MELOXICAM 15 MG PO TABS: 15.0000 mg | ORAL_TABLET | Freq: Every day | ORAL | Status: DC

## 2014-01-25 NOTE — Patient Instructions (Addendum)
Very nice to meet you Try exercises 3 times a week Ice 20 minutes 2 times daily.  Tramadol at night Meloxicam daily for 10 days then as needed Wear brace daily for 1 week then with activity for 4 weeks.  Come back in 2-3 weeks.

## 2014-01-25 NOTE — Assessment & Plan Note (Signed)
Patient does have what appears to be a medial meniscal tear. I do think patient is having some internal derangement which is causing her to have locking of the knee. We discussed the possibility of an injection which patient declined today. Patient wants to try conservative therapy giving her anti-inflammatories, icing protocol and was given a brace was fitted by me today. Patient is going to try to do this as well as a home exercise program and avoid any twisting or squatting. Patient will then come back again in 23 weeks for further evaluation. Continuing to have difficulties I would try one injection as well as x-ray probably at next followup.

## 2014-01-25 NOTE — Progress Notes (Signed)
[name] [place], [place] [address] Phone: [phone] Subjective:     CC: Knee pain  [id number] Deanna Schwartz is a 33 y.o. female coming in with complaint of knee pain. This is left-sided. Patient states that this occurred 2 days ago when she was squatting down to help her daughter tie her shoe. Patient had been having some mild discomfort before this while she was doing aerobic exercises with significant twisting motions. Patient states though 2 days ago she had severe pain in her knees him to lock in one area. Patient was unable to get comfortable last night. Patient has been taking over-the-counter anti-inflammatories with mild improvement. Patient is found it difficult to angulate or bear weight secondary to the pain. Patient states most the pain is on the posterior and medial aspect of the knee. States that the knee can feel unstable. Does not know if this been any swelling. Denies any radiation of pain or any numbness but states that her calf muscle seems to be tight. Denies any shortness of breath or any significant recent travel history.     Past medical history, social, surgical and family history all reviewed in electronic medical record.   Review of Systems: No headache, visual changes, nausea, vomiting, diarrhea, constipation, dizziness, abdominal pain, skin rash, fevers, chills, night sweats, weight loss, swollen lymph nodes, body aches, joint swelling, muscle aches, chest pain, shortness of breath, mood changes.   Objective Blood pressure 104/72, pulse 88, weight 212 lb (96.163 kg), last menstrual period 12/02/2013, SpO2 97.00%.  General: No apparent distress alert and oriented x3 mood and affect normal, dressed appropriately. Obese HEENT: Pupils equal, extraocular movements intact  Respiratory: Patient's speak in full sentences and does not appear short of breath  Cardiovascular: No lower extremity edema, non tender, no  erythema  Skin: Warm dry intact with no signs of infection or rash on extremities or on axial skeleton.  Abdomen: Soft nontender  Neuro: Cranial nerves II through XII are intact, neurovascularly intact in all extremities with 2+ DTRs and 2+ pulses.  Lymph: No lymphadenopathy of posterior or anterior cervical chain or axillae bilaterally.  Gait normal with good balance and coordination.  MSK:  Non tender with full range of motion and good stability and symmetric strength and tone of shoulders, elbows, wrist, hip,  and ankles bilaterally.  Knee: Normal to inspection with no erythema or effusion or obvious bony abnormalities. Difficult to assess secondary to patient's body habitus Patient is severely tender over the medial joint line and mild over the posterior fossa ROM full in flexion and extension and lower leg rotation. Ligaments with solid consistent endpoints including ACL, PCL, LCL, MCL. Positive Mcmurray's, Apley's, and Thessalonian tests. Non painful patellar compression. Patellar glide without crepitus. Patellar and quadriceps tendons unremarkable. Hamstring and quadriceps strength is normal.  Contralateral knee unremarkable  MSK US performed of: left This study was ordered, performed, and interpreted by Charlann Boxer D.O.  Knee: All structures visualized. Anteromedial, anterolateral,  and posterolateral menisci unremarkable without tearing, fraying, effusion, or displacement. Posterior medial meniscus does have a tear that is approximately 20% of the tendon that does have some mild displacement hypoechoic changes. Patellar Tendon unremarkable on long and transverse views without effusion. No abnormality of prepatellar bursa. LCL and MCL unremarkable on long and transverse views. No abnormality of origin of medial or lateral head of the gastrocnemius.  IMPRESSION: Small posterior medial meniscal tear    Impression and Recommendations:  This case required medical decision  making of moderate complexity.

## 2014-02-15 ENCOUNTER — Ambulatory Visit (INDEPENDENT_AMBULATORY_CARE_PROVIDER_SITE_OTHER)
Admission: RE | Admit: 2014-02-15 | Discharge: 2014-02-15 | Disposition: A | Discharge status: Home / Self Care | Payer: Self-pay | Source: Ambulatory Visit | Attending: Physician Assistant | Admitting: Physician Assistant

## 2014-02-15 ENCOUNTER — Ambulatory Visit: Payer: Self-pay | Admitting: Gastroenterology

## 2014-02-15 ENCOUNTER — Ambulatory Visit (INDEPENDENT_AMBULATORY_CARE_PROVIDER_SITE_OTHER): Payer: Self-pay | Admitting: Physician Assistant

## 2014-02-15 ENCOUNTER — Encounter: Payer: Self-pay | Admitting: Physician Assistant

## 2014-02-15 ENCOUNTER — Other Ambulatory Visit (INDEPENDENT_AMBULATORY_CARE_PROVIDER_SITE_OTHER): Payer: Self-pay

## 2014-02-15 ENCOUNTER — Other Ambulatory Visit: Payer: Self-pay | Admitting: *Deleted

## 2014-02-15 VITALS — BP 102/76 | HR 76 | Ht 63.0 in | Wt 212.6 lb

## 2014-02-15 DIAGNOSIS — R1012 Left upper quadrant pain: Secondary | ICD-10-CM

## 2014-02-15 DIAGNOSIS — R109 Unspecified abdominal pain: Secondary | ICD-10-CM

## 2014-02-15 LAB — LIPASE: LIPASE: 23 U/L (ref 11.0–59.0)

## 2014-02-15 LAB — CBC WITH DIFFERENTIAL/PLATELET
BASOS ABS: 0.1 10*3/uL (ref 0.0–0.1)
BASOS PCT: 0.6 % (ref 0.0–3.0)
Eosinophils Absolute: 0.9 10*3/uL — ABNORMAL HIGH (ref 0.0–0.7)
Eosinophils Relative: 9.7 % — ABNORMAL HIGH (ref 0.0–5.0)
HCT: 35.5 % — ABNORMAL LOW (ref 36.0–46.0)
Hemoglobin: 11.5 g/dL — ABNORMAL LOW (ref 12.0–15.0)
LYMPHS PCT: 32.6 % (ref 12.0–46.0)
Lymphs Abs: 2.9 10*3/uL (ref 0.7–4.0)
MCHC: 32.3 g/dL (ref 30.0–36.0)
MCV: 76.7 fl — ABNORMAL LOW (ref 78.0–100.0)
MONO ABS: 0.5 10*3/uL (ref 0.1–1.0)
Monocytes Relative: 5.5 % (ref 3.0–12.0)
NEUTROS PCT: 51.6 % (ref 43.0–77.0)
Neutro Abs: 4.5 10*3/uL (ref 1.4–7.7)
PLATELETS: 297 10*3/uL (ref 150.0–400.0)
RBC: 4.63 Mil/uL (ref 3.87–5.11)
RDW: 15.9 % — AB (ref 11.5–15.5)
WBC: 8.8 10*3/uL (ref 4.0–10.5)

## 2014-02-15 LAB — URINALYSIS, ROUTINE W REFLEX MICROSCOPIC
BILIRUBIN URINE: NEGATIVE
Ketones, ur: NEGATIVE
Nitrite: NEGATIVE
Specific Gravity, Urine: 1.015 (ref 1.000–1.030)
Total Protein, Urine: NEGATIVE
Urine Glucose: NEGATIVE
Urobilinogen, UA: 0.2 (ref 0.0–1.0)
pH: 6.5 (ref 5.0–8.0)

## 2014-02-15 NOTE — Progress Notes (Signed)
Subjective:    Patient ID: Deanna Schwartz, female    DOB: 04-01-1981, 33 y.o.   MRN: [id number]  HPI Deanna Schwartz is a very nice 33 year old female known to Dr. Carlean Purl, employee of [employer] who comes in today for evaluation of acute left mid abdominal pain radiating to the left back. She says this started early this morning and has been sharp and very uncomfortable all day. She has been afraid to eaten has had mild nausea but no vomiting. She did have 2 episodes of loose stools. No fever or chills. She says this pain is very similar to the pain she has been having since June of last year which she describes as a left mid to upper abdominal pain which is very dull in nature and never completely subsides. This does not seem to be necessarily aggravated by eating.. She has been trying to avoid spicy foods and fatty foods and has not noticed any changes. Patient had an ER visit in April of 2015 with an episode of this left-sided pain and had CT scan of the abdomen and pelvis done at that time with no IV contrast which was unremarkable. Urine was positive only for positive leukocyte Estrace. Other medical problems include mild iron deficiency anemia , felt secondary to menorrhagia, asthma, B12 deficiency, and GERD for which she is on Dexilant. . She did have an EGD in 2013 and was treated for H. pylori gastritis.  Labs done today before her office visit, WBC 8.8 hemoglobin 11.5 which is stable hematocrit of 35 MCV of 76, Lipase  23, UA shows moderate hemoglobin trace leukocytes 3-6 rbc's a few squamous epithelials and rare bacteria    Review of Systems  Constitutional: Positive for appetite change.  HENT: Negative.   Eyes: Negative.   Respiratory: Negative.   Cardiovascular: Negative.   Gastrointestinal: Positive for nausea and abdominal pain.  Endocrine: Negative.   Genitourinary: Negative.   Musculoskeletal: Positive for back pain.  Allergic/Immunologic: Negative.   Neurological: Negative.     Hematological: Negative.   Psychiatric/Behavioral: Negative.    Outpatient Prescriptions Prior to Visit  Medication Sig Dispense Refill  . albuterol (PROVENTIL HFA;VENTOLIN HFA) 108 (90 BASE) MCG/ACT inhaler Inhale 2 puffs into the lungs every 6 (six) hours as needed for wheezing.  1 Inhaler  5  . cetirizine (ZYRTEC) 10 MG tablet Take 10 mg by mouth daily.      [name] Dexlansoprazole (DEXILANT) 30 MG capsule Take 30 mg by mouth daily.      . ferrous sulfate 325 (65 FE) MG tablet Take 325 mg by mouth daily with breakfast.      . meloxicam (MOBIC) 15 MG tablet Take 1 tablet (15 mg total) by mouth daily.  30 tablet  0   No facility-administered medications prior to visit.   Allergies  Allergen Reactions  . Augmentin [Amoxicillin-Pot Clavulanate] Rash  . Cephalexin Rash  . Iodine Swelling and Rash    Throat swelling   Patient Active Problem List   Diagnosis Date Noted  . Acute medial meniscal tear 01/25/2014  . Weight gain 11/18/2013  . Anemia, iron deficiency 11/18/2013  . Acute sinusitis 11/05/2013  . Asthma with acute exacerbation 11/05/2013  . Abdominal pain, left upper quadrant-musculoskeletal 05/13/2013  . Iron deficiency anemia 08/06/2011  . B12 deficiency with anti-parietal cell antibodies + 03/25/2011  . Arthralgia 03/24/2011  . INSOMNIA, CHRONIC 10/11/2010  . INTRINSIC ASTHMA, WITH EXACERBATION 09/04/2010  . IBS 04/07/2008  . PALPITATIONS 01/11/2008  . ANXIETY 11/25/2007  .  ALLERGIC RHINITIS 11/25/2007  . GERD 11/25/2007  . SINUSITIS, CHRONIC 04/03/2007  . ASTHMA 04/03/2007   History  Substance Use Topics  . Smoking status: Never Smoker   . Smokeless tobacco: Never Used  . Alcohol Use: Yes     Comment: Social maybe twice amonth, beer   family history includes Diabetes in her maternal aunt, maternal aunt and another family member; Hypertension in her father, mother, and another family member. There is no history of Colon cancer.     Objective:   Physical Exam   well-developed young Hispanic female in no acute distress, doesn't blood pressure 102/76 pulse 76 height 5 foot 3 weight 212. HEENT; nontraumatic normocephalic EOMI PERRLA, Supple; no JVD, Cardiovascular; regular rate and rhythm with S1-S2 no murmur or gallop, Pulm; clear bilaterally, Abdomen; soft, bowel sounds are present she has tenderness to palpation along the left costal margin anteriorly and is also tender in the left mid abdomen there is no guarding or rebound no palpable mass or hepatosplenomegaly, She has tenderness to palpation of the left flank with percussion, Rectal; not done, Extremities; no clubbing cyanosis or edema skin warm dry, Psych; mood and affect appropriate        Assessment & Plan:  #59  33 year old female with chronic left upper quadrant pain x1 year etiology not clear probably musculoskeletal now with acute left mid quadrant pain radiating to the left back and flank. Urine is abnormal, question ureterolithiasis.  #2 history of H. pylori gastritis treated #3 mild iron deficiency anemia secondary to menorrhagia #4 asthma #5 chronic GERD #6 IBS Plan; patient is encouraged to use Bentyl 10 mg every 6 hours when necessary if she finds this helpful Ultram 50 mg every 6-8 hours when necessary for pain Schedule for abdominal ultrasound to evaluate kidneys and ureters, this will be scheduled for tomorrow.

## 2014-02-15 NOTE — Patient Instructions (Signed)
Take Ultram 50 mg, take 1 tab every 6 hours as needed for pain. Please go to the basement level to our radiology department for an X-ray.

## 2014-02-16 ENCOUNTER — Ambulatory Visit (HOSPITAL_COMMUNITY): Payer: Self-pay

## 2014-02-16 NOTE — Progress Notes (Signed)
Agree with Ms. Esterwood's assessment and plan. Carl E. Gessner, MD, FACG   

## 2014-02-23 ENCOUNTER — Ambulatory Visit (HOSPITAL_COMMUNITY)
Admission: RE | Admit: 2014-02-23 | Discharge: 2014-02-23 | Disposition: A | Discharge status: Home / Self Care | Payer: Self-pay | Source: Ambulatory Visit | Attending: Physician Assistant | Admitting: Physician Assistant

## 2014-02-23 DIAGNOSIS — R1012 Left upper quadrant pain: Secondary | ICD-10-CM | POA: Insufficient documentation

## 2014-02-23 DIAGNOSIS — R109 Unspecified abdominal pain: Secondary | ICD-10-CM

## 2014-04-04 ENCOUNTER — Telehealth: Payer: Self-pay | Admitting: *Deleted

## 2014-04-04 NOTE — Telephone Encounter (Signed)
Left msg on triage wanting to come in to have her iron level check. Called pt back inform her she need to make f/u appt with Dr. Alain Marion. Made appt for 04/13/14@11 :15am.../lmb

## 2014-04-07 ENCOUNTER — Ambulatory Visit (INDEPENDENT_AMBULATORY_CARE_PROVIDER_SITE_OTHER): Payer: Self-pay | Admitting: Family Medicine

## 2014-04-07 ENCOUNTER — Other Ambulatory Visit (INDEPENDENT_AMBULATORY_CARE_PROVIDER_SITE_OTHER): Payer: Self-pay

## 2014-04-07 ENCOUNTER — Ambulatory Visit (INDEPENDENT_AMBULATORY_CARE_PROVIDER_SITE_OTHER)
Admission: RE | Admit: 2014-04-07 | Discharge: 2014-04-07 | Disposition: A | Discharge status: Home / Self Care | Payer: Self-pay | Source: Ambulatory Visit | Attending: Family Medicine | Admitting: Family Medicine

## 2014-04-07 VITALS — BP 112/78 | HR 78 | Ht 64.0 in | Wt 214.0 lb

## 2014-04-07 DIAGNOSIS — M2550 Pain in unspecified joint: Secondary | ICD-10-CM

## 2014-04-07 DIAGNOSIS — M222X9 Patellofemoral disorders, unspecified knee: Secondary | ICD-10-CM | POA: Insufficient documentation

## 2014-04-07 DIAGNOSIS — M25561 Pain in right knee: Secondary | ICD-10-CM

## 2014-04-07 DIAGNOSIS — M25569 Pain in unspecified knee: Secondary | ICD-10-CM

## 2014-04-07 DIAGNOSIS — M222X1 Patellofemoral disorders, right knee: Secondary | ICD-10-CM

## 2014-04-07 LAB — CBC WITH DIFFERENTIAL/PLATELET
BASOS PCT: 0.2 % (ref 0.0–3.0)
Basophils Absolute: 0 10*3/uL (ref 0.0–0.1)
EOS PCT: 14.8 % — AB (ref 0.0–5.0)
Eosinophils Absolute: 1.2 10*3/uL — ABNORMAL HIGH (ref 0.0–0.7)
HEMATOCRIT: 33.6 % — AB (ref 36.0–46.0)
HEMOGLOBIN: 10.9 g/dL — AB (ref 12.0–15.0)
LYMPHS ABS: 2.1 10*3/uL (ref 0.7–4.0)
Lymphocytes Relative: 24.8 % (ref 12.0–46.0)
MCHC: 32.5 g/dL (ref 30.0–36.0)
MCV: 77 fl — AB (ref 78.0–100.0)
MONOS PCT: 6.4 % (ref 3.0–12.0)
Monocytes Absolute: 0.5 10*3/uL (ref 0.1–1.0)
Neutro Abs: 4.4 10*3/uL (ref 1.4–7.7)
Neutrophils Relative %: 53.8 % (ref 43.0–77.0)
PLATELETS: 272 10*3/uL (ref 150.0–400.0)
RBC: 4.36 Mil/uL (ref 3.87–5.11)
RDW: 15 % (ref 11.5–15.5)
WBC: 8.3 10*3/uL (ref 4.0–10.5)

## 2014-04-07 LAB — SEDIMENTATION RATE: SED RATE: 29 mm/h — AB (ref 0–22)

## 2014-04-07 LAB — VITAMIN D 25 HYDROXY (VIT D DEFICIENCY, FRACTURES): VITD: 30.24 ng/mL (ref 30.00–100.00)

## 2014-04-07 LAB — TSH: TSH: 1.31 u[IU]/mL (ref 0.35–4.50)

## 2014-04-07 LAB — FERRITIN: Ferritin: 8.1 ng/mL — ABNORMAL LOW (ref 10.0–291.0)

## 2014-04-07 LAB — RHEUMATOID FACTOR

## 2014-04-07 MED ORDER — PREDNISONE 50 MG PO TABS: 50.0000 mg | ORAL_TABLET | Freq: Every day | ORAL | Status: DC

## 2014-04-07 NOTE — Assessment & Plan Note (Signed)
Patient has had multiple joint pain previously. Patient is also had anemia. Do think that these could be also contributing. I would like to do further workup and lateral ordered today to rule out an autoimmune disease that could be causing difficulty. The discussed outpatient response to steroids previously and now this has been beneficial. We discussed continuing the home exercises as well as watching her diet. We'll see the patient next improvement. We'll discuss left with patient at followup.  Spent greater than 25 minutes with patient face-to-face and had greater than 50% of counseling including as described above in assessment and plan.

## 2014-04-07 NOTE — Progress Notes (Signed)
[name] [place], [place] [address] Phone: [phone] Subjective:     CC: Knee pain  ZWC:[id number] Deanna Schwartz is a 33 y.o. female coming in with complaint of knee pain. Patient was previously seen and did have a small posterior meniscal tear. Patient was doing somewhat better but unfortunately has started having increasing pain again. Patient also states that now her left knee Dineen Kid is well. Patient states that going upstairs seems to be worse. Pain is over the lateral aspect of the knees to the medial aspect of the knees. Patient states that it seems to be very audible grinding sensation that occurs as well to consider a dull aching pain. Patient states squatting makes the pain worse as well. Patient is also having aches and pains in multiple other joints from time to time it seems to be there for one day and then go away. Patient states the severity of these problems for about 6/10. Patient is still able to do activities of daily living but finds it difficult such as lifting her daughter with some of these pains occur.     Past medical history, social, surgical and family history all reviewed in electronic medical record.   Review of Systems: No headache, visual changes, nausea, vomiting, diarrhea, constipation, dizziness, abdominal pain, skin rash, fevers, chills, night sweats, weight loss, swollen lymph nodes, body aches, joint swelling, muscle aches, chest pain, shortness of breath, mood changes.   Objective Blood pressure 112/78, pulse 78, height 5\' 4"  (1.626 m), weight 214 lb (97.07 kg), last menstrual period 03/15/2014, SpO2 98.00%.  General: No apparent distress alert and oriented x3 mood and affect normal, dressed appropriately. Obese HEENT: Pupils equal, extraocular movements intact  Respiratory: Patient's speak in full sentences and does not appear short of breath  Cardiovascular: No lower extremity edema, non tender, no  erythema  Skin: Warm dry intact with no signs of infection or rash on extremities or on axial skeleton.  Abdomen: Soft nontender  Neuro: Cranial nerves II through XII are intact, neurovascularly intact in all extremities with 2+ DTRs and 2+ pulses.  Lymph: No lymphadenopathy of posterior or anterior cervical chain or axillae bilaterally.  Gait normal with good balance and coordination.  MSK:  Non tender with full range of motion and good stability and symmetric strength and tone of shoulders, elbows, wrist, hip,  and ankles bilaterally. No gross swelling or any other joints today. Knee: Bilateral Normal to inspection with no erythema or effusion or obvious bony abnormalities. Difficult to assess secondary to patient's body habitus More tender today over the superior lateral aspect of the knees bilaterally. ROM full in flexion and extension and lower leg rotation. Ligaments with solid consistent endpoints including ACL, PCL, LCL, MCL. Negative Mcmurray's, Apley's, and Thessalonian tests.  painful patellar compression. Patellar glide without crepitus. Patellar and quadriceps tendons unremarkable. Hamstring and quadriceps strength is normal.    MSK US performed of: left This study was ordered, performed, and interpreted by Charlann Boxer D.O.  Knee: All structures visualized. Anteromedial, anterolateral,  and posterolateral menisci unremarkable without tearing, fraying, effusion, or displacement. Previous tear appreciated on ultrasound seems to be healed at this time. Patient does have hypoechoic changes of the patellofemoral joint. Patellar Tendon unremarkable on long and transverse views without effusion. No abnormality of prepatellar bursa. LCL and MCL unremarkable on long and transverse views. No abnormality of origin of medial or lateral head of the gastrocnemius.  IMPRESSION: Patellofemoral syndrome  Impression and Recommendations:     This case required medical decision making  of moderate complexity.

## 2014-04-07 NOTE — Patient Instructions (Addendum)
Good to see you Deanna Schwartz is your friend.  Exercises for your knee 3-5 times a week Physical therapy will be calling you Exercises for your elbow 3 times a week and lift with thumbs up.  Prednisone daily for 5 days then as needed.  Labs and xray today.  Come back again in 3-4 weeks and we may need to do injection

## 2014-04-07 NOTE — Assessment & Plan Note (Addendum)
Bilateral. Right greater than left. Discuss with exercises as well as icing protocol. Patient was sent to formal physical therapy and x-rays ordered today. Discussed a bracing could also be very beneficial. Patient will come back again in 3-4 weeks for further evaluation and treatment. Is continuing to have trouble elected to an injection.

## 2014-04-08 ENCOUNTER — Encounter: Payer: Self-pay | Admitting: Family Medicine

## 2014-04-10 LAB — ANA: Anti Nuclear Antibody(ANA): NEGATIVE

## 2014-04-11 ENCOUNTER — Emergency Department (INDEPENDENT_AMBULATORY_CARE_PROVIDER_SITE_OTHER)
Admission: EM | Admit: 2014-04-11 | Discharge: 2014-04-11 | Disposition: A | Discharge status: Home / Self Care | Payer: Self-pay | Source: Home / Self Care | Attending: Emergency Medicine | Admitting: Emergency Medicine

## 2014-04-11 ENCOUNTER — Encounter (HOSPITAL_COMMUNITY): Payer: Self-pay | Admitting: Emergency Medicine

## 2014-04-11 DIAGNOSIS — J069 Acute upper respiratory infection, unspecified: Secondary | ICD-10-CM

## 2014-04-11 LAB — GLUCOSE, CAPILLARY: Glucose-Capillary: 80 mg/dL (ref 70–99)

## 2014-04-11 LAB — POCT RAPID STREP A: Streptococcus, Group A Screen (Direct): NEGATIVE

## 2014-04-11 MED ORDER — PREDNISONE 50 MG PO TABS: 50.0000 mg | ORAL_TABLET | Freq: Every day | ORAL | Status: DC

## 2014-04-11 NOTE — Discharge Instructions (Signed)
You likely have a viral infection causing your symptoms. Take the prednisone 1 pill daily for 5 days to help with your symptoms. Use nasal saline spray or a netti pot to wash out your sinuses.  Follow up with your regular doctor in 1-2 weeks to make sure the dizziness has resolved. If you are worsening, develop fevers, or get so dizzy you cannot stand up, please go to the ER.

## 2014-04-11 NOTE — ED Provider Notes (Signed)
CSN: [id number]     Arrival date & time 04/11/14  1520 History   First MD Initiated Contact with Patient 04/11/14 1541     Chief Complaint  Patient presents with  . Sore Throat   (Consider location/radiation/quality/duration/timing/severity/associated sxs/prior Treatment) HPI She is here today for evaluation of sore throat. She states her symptoms started last week with an off-balance feeling. This is intermittent. She then went on to develop some URI symptoms including nasal congestion and rhinorrhea. She denies any ear pain or discharge. Then, this morning, she woke up with a sore throat, sinus pressure, neck pain. She denies any cough or shortness of breath. No fevers at home.  Past Medical History  Diagnosis Date  . Asthma   . Anxiety   . GERD (gastroesophageal reflux disease)   . Allergic rhinitis   . Chronic constipation   . Anemia   . H. pylori infection 2008    Hx of tx + serology  . Panic attack   . Irritable bowel syndrome   . Obesity   . 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 Iron deficiency anemia 08/06/2011    Hgb 10.2 and MCV 79 on health screen labs    Past Surgical History  Procedure Laterality Date  . Nasal sinus surgery    . Flexible sigmoidoscopy  04/14/2008    normal  . Upper gastrointestinal endoscopy     Family History  Problem Relation Age of Onset  . Diabetes      grandmother  . Hypertension    . Colon cancer Neg Hx   . Hypertension Mother   . Hypertension Father   . Diabetes Maternal Aunt   . Diabetes Maternal Aunt    History  Substance Use Topics  . Smoking status: Never Smoker   . Smokeless tobacco: Never Used  . Alcohol Use: Yes     Comment: Social maybe twice amonth, beer   OB History   Grav Para Term Preterm Abortions TAB SAB Ect Mult Living                 Review of Systems  Constitutional: Negative.   HENT: Positive for congestion, rhinorrhea, sinus pressure and sore throat. Negative for  ear pain and trouble swallowing.   Eyes: Negative.   Respiratory: Negative for cough, shortness of breath and wheezing.   Cardiovascular: Negative.   Gastrointestinal: Negative.   Musculoskeletal: Positive for neck pain.  Skin: Negative for rash.  Neurological: Positive for dizziness. Negative for weakness and headaches.    Allergies  Augmentin; Cephalexin; and Iodine  Home Medications   Prior to Admission medications   Medication Sig Start Date End Date Taking? Authorizing Provider  albuterol (PROVENTIL HFA;VENTOLIN HFA) 108 (90 BASE) MCG/ACT inhaler Inhale 2 puffs into the lungs every 6 (six) hours as needed for wheezing. 11/18/13   Aleksei Plotnikov V, MD  cetirizine (ZYRTEC) 10 MG tablet Take 10 mg by mouth daily.    Historical Provider, MD  Dexlansoprazole (DEXILANT) 30 MG capsule Take 30 mg by mouth daily.    Historical Provider, MD  ferrous sulfate 325 (65 FE) MG tablet Take 325 mg by mouth daily with breakfast.    Historical Provider, MD  predniSONE (DELTASONE) 50 MG tablet Take 1 tablet (50 mg total) by mouth daily. 04/11/14   Melony Overly, MD   BP 106/80  Pulse 82  Temp(Src) 98.1 F (36.7 C) (Oral)  Resp 16  SpO2 99%  LMP 03/15/2014 Physical Exam  Constitutional: She is oriented to person, place, and time. She appears well-developed and well-nourished. No distress.  HENT:  Head: Normocephalic and atraumatic.  Right Ear: Tympanic membrane and external ear normal.  Left Ear: Tympanic membrane and external ear normal.  Nose: Rhinorrhea present. No mucosal edema or sinus tenderness.  Mouth/Throat: Oropharynx is clear and moist. Mucous membranes are not dry. No oropharyngeal exudate, posterior oropharyngeal edema or posterior oropharyngeal erythema.  Cardiovascular: Normal rate.   Pulmonary/Chest: Effort normal and breath sounds normal. No respiratory distress. She has no wheezes. She has no rales.  Neurological: She is alert and oriented to person, place, and time.  Skin:  Skin is warm and dry. No rash noted.    ED Course  Procedures (including critical care time) Labs Review Labs Reviewed  GLUCOSE, CAPILLARY  POCT RAPID STREP A ([facility] CARE ONLY)  CBG: 80  Imaging Review No results found.   MDM   1. Viral URI    Discussed symptomatic treatment with nasal saline spray. Prescribed a prednisone burst as she seems to have a component of labyrinthitis. Review red flags with the patient. Followup if worsening, dizziness progresses, develops fevers. Recommended scheduling a followup with her primary care doctor in 1-2 weeks to make sure the dizziness has resolved.    Melony Overly, MD 04/11/14 [phone]

## 2014-04-13 ENCOUNTER — Ambulatory Visit: Payer: Self-pay | Admitting: Internal Medicine

## 2014-04-13 LAB — CULTURE, GROUP A STREP

## 2014-04-14 ENCOUNTER — Telehealth: Payer: Self-pay | Admitting: *Deleted

## 2014-04-14 MED ORDER — ERYTHROMYCIN 5 MG/GM OP OINT: 1.0000 "application " | TOPICAL_OINTMENT | Freq: Three times a day (TID) | OPHTHALMIC | Status: DC

## 2014-04-14 NOTE — Telephone Encounter (Signed)
Noted Erythro ophth oint emailed OV if not well Thx

## 2014-04-14 NOTE — Telephone Encounter (Signed)
Left msg on triage requesting md to rx drops for pink eye. She stated both eyes are rd with yellowish discharge. Can't afford another ov just seen another md couple days ago...Deanna Schwartz

## 2014-04-14 NOTE — Telephone Encounter (Signed)
notified pt with md response. Rx sent to [facility]...Johny Chess

## 2014-04-17 ENCOUNTER — Encounter: Payer: Self-pay | Admitting: Internal Medicine

## 2014-04-18 ENCOUNTER — Other Ambulatory Visit: Payer: Self-pay | Admitting: Internal Medicine

## 2014-04-18 MED ORDER — AZITHROMYCIN 250 MG PO TABS: ORAL_TABLET | ORAL | Status: DC

## 2014-04-28 ENCOUNTER — Ambulatory Visit: Payer: Self-pay | Admitting: Internal Medicine

## 2014-05-09 ENCOUNTER — Encounter: Payer: Self-pay | Admitting: Internal Medicine

## 2014-05-09 ENCOUNTER — Ambulatory Visit (INDEPENDENT_AMBULATORY_CARE_PROVIDER_SITE_OTHER): Payer: Self-pay | Admitting: Internal Medicine

## 2014-05-09 VITALS — BP 120/80 | HR 80 | Temp 98.6°F | Resp 16 | Wt 214.0 lb

## 2014-05-09 DIAGNOSIS — R635 Abnormal weight gain: Secondary | ICD-10-CM

## 2014-05-09 DIAGNOSIS — J45909 Unspecified asthma, uncomplicated: Secondary | ICD-10-CM

## 2014-05-09 DIAGNOSIS — J309 Allergic rhinitis, unspecified: Secondary | ICD-10-CM

## 2014-05-09 MED ORDER — ALBUTEROL SULFATE 108 (90 BASE) MCG/ACT IN AEPB: 1.0000 | INHALATION_SPRAY | Freq: Four times a day (QID) | RESPIRATORY_TRACT | Status: DC | PRN

## 2014-05-09 MED ORDER — FLUTICASONE FUROATE-VILANTEROL 200-25 MCG/INH IN AEPB: 1.0000 | INHALATION_SPRAY | Freq: Every day | RESPIRATORY_TRACT | Status: DC

## 2014-05-09 NOTE — Assessment & Plan Note (Addendum)
Likely is causing her chest tightness Start Breo Albuterol - Proair respiclick prn

## 2014-05-09 NOTE — Assessment & Plan Note (Signed)
Zyrtec  

## 2014-05-09 NOTE — Progress Notes (Signed)
   Subjective:    HPI  C/o chest tightness x 2 days C/o wt gain  Review of Systems  Constitutional: Positive for unexpected weight change. Negative for chills, activity change, appetite change and fatigue.  HENT: Negative for congestion, mouth sores and sinus pressure.   Eyes: Negative for visual disturbance.  Respiratory: Negative for cough and chest tightness.   Gastrointestinal: Negative for nausea and abdominal pain.  Genitourinary: Negative for frequency, difficulty urinating and vaginal pain.  Musculoskeletal: Negative for back pain and gait problem.  Skin: Negative for pallor and rash.  Neurological: Negative for dizziness, tremors, weakness, numbness and headaches.  Psychiatric/Behavioral: Negative for confusion and sleep disturbance.       Objective:   Physical Exam  Constitutional: She appears well-developed. No distress.  Obese  HENT:  Head: Normocephalic.  Right Ear: External ear normal.  Left Ear: External ear normal.  Nose: Nose normal.  Mouth/Throat: Oropharynx is clear and moist.  Eyes: Conjunctivae are normal. Pupils are equal, round, and reactive to light. Right eye exhibits no discharge. Left eye exhibits no discharge.  Neck: Normal range of motion. Neck supple. No JVD present. No tracheal deviation present. No thyromegaly present.  Cardiovascular: Normal rate, regular rhythm and normal heart sounds.   Pulmonary/Chest: No stridor. No respiratory distress. She has no wheezes.  Abdominal: Soft. Bowel sounds are normal. She exhibits no distension and no mass. There is no tenderness. There is no rebound and no guarding.  Musculoskeletal: She exhibits no edema and no tenderness.  Lymphadenopathy:    She has no cervical adenopathy.  Neurological: She displays normal reflexes. No cranial nerve deficit. She exhibits normal muscle tone. Coordination normal.  Skin: No rash noted. No erythema.  Psychiatric: She has a normal mood and affect. Her behavior is normal.  Judgment and thought content normal.          Assessment & Plan:

## 2014-05-09 NOTE — Progress Notes (Signed)
Pre visit review using our clinic review tool, if applicable. No additional management support is needed unless otherwise documented below in the visit note. 

## 2014-05-09 NOTE — Assessment & Plan Note (Signed)
Wt Readings from Last 3 Encounters:  05/09/14 214 lb (97.07 kg)  04/07/14 214 lb (97.07 kg)  02/15/14 212 lb 9.6 oz (96.435 kg)

## 2014-06-20 ENCOUNTER — Encounter: Payer: Self-pay | Admitting: Internal Medicine

## 2014-06-20 ENCOUNTER — Ambulatory Visit (INDEPENDENT_AMBULATORY_CARE_PROVIDER_SITE_OTHER): Payer: Self-pay | Admitting: Internal Medicine

## 2014-06-20 ENCOUNTER — Other Ambulatory Visit (INDEPENDENT_AMBULATORY_CARE_PROVIDER_SITE_OTHER): Payer: Self-pay

## 2014-06-20 VITALS — BP 90/60 | HR 72 | Temp 98.6°F | Resp 16 | Wt 213.0 lb

## 2014-06-20 DIAGNOSIS — D509 Iron deficiency anemia, unspecified: Secondary | ICD-10-CM

## 2014-06-20 DIAGNOSIS — R635 Abnormal weight gain: Secondary | ICD-10-CM

## 2014-06-20 DIAGNOSIS — E538 Deficiency of other specified B group vitamins: Secondary | ICD-10-CM

## 2014-06-20 DIAGNOSIS — M2550 Pain in unspecified joint: Secondary | ICD-10-CM

## 2014-06-20 DIAGNOSIS — M7062 Trochanteric bursitis, left hip: Secondary | ICD-10-CM

## 2014-06-20 LAB — TSH: TSH: 1.19 u[IU]/mL (ref 0.35–4.50)

## 2014-06-20 LAB — URINALYSIS, ROUTINE W REFLEX MICROSCOPIC
Bilirubin Urine: NEGATIVE
Hgb urine dipstick: NEGATIVE
Ketones, ur: NEGATIVE
NITRITE: NEGATIVE
SPECIFIC GRAVITY, URINE: 1.01 (ref 1.000–1.030)
Total Protein, Urine: NEGATIVE
UROBILINOGEN UA: 0.2 (ref 0.0–1.0)
Urine Glucose: NEGATIVE
pH: 6 (ref 5.0–8.0)

## 2014-06-20 LAB — IBC PANEL
Iron: 17 ug/dL — ABNORMAL LOW (ref 42–145)
SATURATION RATIOS: 3.6 % — AB (ref 20.0–50.0)
TRANSFERRIN: 339.8 mg/dL (ref 212.0–360.0)

## 2014-06-20 LAB — BASIC METABOLIC PANEL
BUN: 11 mg/dL (ref 6–23)
CHLORIDE: 103 meq/L (ref 96–112)
CO2: 27 meq/L (ref 19–32)
CREATININE: 0.9 mg/dL (ref 0.4–1.2)
Calcium: 9.5 mg/dL (ref 8.4–10.5)
GFR: 96.83 mL/min (ref >60.00)
Glucose, Bld: 89 mg/dL (ref 70–99)
Potassium: 4.1 mEq/L (ref 3.5–5.1)
Sodium: 136 mEq/L (ref 135–145)

## 2014-06-20 LAB — VITAMIN B12: Vitamin B-12: 267 pg/mL (ref 211–911)

## 2014-06-20 NOTE — Patient Instructions (Signed)
Stretch  

## 2014-06-20 NOTE — Assessment & Plan Note (Signed)
Labs s

## 2014-06-20 NOTE — Assessment & Plan Note (Signed)
Labs

## 2014-06-20 NOTE — Progress Notes (Signed)
   Subjective:    HPI  C/o L LBP x 1 year - worse C/o B hands and forearm tingling B C/o wt gain  BP Readings from Last 3 Encounters:  06/20/14 90/60  05/09/14 120/80  04/11/14 106/80   Wt Readings from Last 3 Encounters:  06/20/14 213 lb (96.616 kg)  05/09/14 214 lb (97.07 kg)  04/07/14 214 lb (97.07 kg)     Review of Systems  Constitutional: Positive for unexpected weight change. Negative for chills, activity change, appetite change and fatigue.  HENT: Negative for congestion, mouth sores and sinus pressure.   Eyes: Negative for visual disturbance.  Respiratory: Negative for cough and chest tightness.   Gastrointestinal: Negative for nausea and abdominal pain.  Genitourinary: Negative for frequency, difficulty urinating and vaginal pain.  Musculoskeletal: Negative for back pain and gait problem.  Skin: Negative for pallor and rash.  Neurological: Negative for dizziness, tremors, weakness, numbness and headaches.  Psychiatric/Behavioral: Negative for confusion and sleep disturbance.       Objective:   Physical Exam  Constitutional: She appears well-developed. No distress.  Obese  HENT:  Head: Normocephalic.  Right Ear: External ear normal.  Left Ear: External ear normal.  Nose: Nose normal.  Mouth/Throat: Oropharynx is clear and moist.  Eyes: Conjunctivae are normal. Pupils are equal, round, and reactive to light. Right eye exhibits no discharge. Left eye exhibits no discharge.  Neck: Normal range of motion. Neck supple. No JVD present. No tracheal deviation present. No thyromegaly present.  Cardiovascular: Normal rate, regular rhythm and normal heart sounds.   Pulmonary/Chest: No stridor. No respiratory distress. She has no wheezes.  Abdominal: Soft. Bowel sounds are normal. She exhibits no distension and no mass. There is no tenderness. There is no rebound and no guarding.  Musculoskeletal: She exhibits no edema and no tenderness.  Lymphadenopathy:    She has  no cervical adenopathy.  Neurological: She displays normal reflexes. No cranial nerve deficit. She exhibits normal muscle tone. Coordination normal.  Skin: No rash noted. No erythema.  Psychiatric: She has a normal mood and affect. Her behavior is normal. Judgment and thought content normal.  L troch major is tender  [place]d [place], [place] 2015       Assessment & Plan:

## 2014-06-20 NOTE — Assessment & Plan Note (Signed)
Not taking B12 Labs

## 2014-06-20 NOTE — Progress Notes (Signed)
Pre visit review using our clinic review tool, if applicable. No additional management support is needed unless otherwise documented below in the visit note. 

## 2014-06-21 ENCOUNTER — Encounter: Payer: Self-pay | Admitting: Internal Medicine

## 2014-06-21 MED ORDER — VITAMIN B-12 1000 MCG SL SUBL: 1.0000 | SUBLINGUAL_TABLET | Freq: Every day | SUBLINGUAL | Status: DC

## 2014-06-21 MED ORDER — CIPROFLOXACIN HCL 250 MG PO TABS: 250.0000 mg | ORAL_TABLET | Freq: Two times a day (BID) | ORAL | Status: DC

## 2014-06-21 NOTE — Assessment & Plan Note (Signed)
Discussed.

## 2014-06-21 NOTE — Assessment & Plan Note (Signed)
Continue with current prescription therapy as reflected on the Med list.  

## 2014-06-26 ENCOUNTER — Emergency Department (HOSPITAL_COMMUNITY)
Admission: EM | Admit: 2014-06-26 | Discharge: 2014-06-26 | Disposition: A | Discharge status: Home / Self Care | Payer: Self-pay | Attending: Emergency Medicine | Admitting: Emergency Medicine

## 2014-06-26 ENCOUNTER — Emergency Department (HOSPITAL_COMMUNITY): Payer: Self-pay

## 2014-06-26 ENCOUNTER — Encounter (HOSPITAL_COMMUNITY): Payer: Self-pay | Admitting: Emergency Medicine

## 2014-06-26 DIAGNOSIS — J45901 Unspecified asthma with (acute) exacerbation: Secondary | ICD-10-CM | POA: Insufficient documentation

## 2014-06-26 DIAGNOSIS — Z792 Long term (current) use of antibiotics: Secondary | ICD-10-CM | POA: Insufficient documentation

## 2014-06-26 DIAGNOSIS — D509 Iron deficiency anemia, unspecified: Secondary | ICD-10-CM | POA: Insufficient documentation

## 2014-06-26 DIAGNOSIS — Z8659 Personal history of other mental and behavioral disorders: Secondary | ICD-10-CM | POA: Insufficient documentation

## 2014-06-26 DIAGNOSIS — Z79899 Other long term (current) drug therapy: Secondary | ICD-10-CM | POA: Insufficient documentation

## 2014-06-26 DIAGNOSIS — R0789 Other chest pain: Secondary | ICD-10-CM | POA: Insufficient documentation

## 2014-06-26 DIAGNOSIS — E669 Obesity, unspecified: Secondary | ICD-10-CM | POA: Insufficient documentation

## 2014-06-26 DIAGNOSIS — K219 Gastro-esophageal reflux disease without esophagitis: Secondary | ICD-10-CM | POA: Insufficient documentation

## 2014-06-26 DIAGNOSIS — Z8619 Personal history of other infectious and parasitic diseases: Secondary | ICD-10-CM | POA: Insufficient documentation

## 2014-06-26 DIAGNOSIS — Z7951 Long term (current) use of inhaled steroids: Secondary | ICD-10-CM | POA: Insufficient documentation

## 2014-06-26 MED ORDER — PREDNISONE 20 MG PO TABS
60.0000 mg | ORAL_TABLET | Freq: Once | ORAL | Status: AC
  Administered 2014-06-26: 60 mg via ORAL
  Filled 2014-06-26: qty 3

## 2014-06-26 MED ORDER — IPRATROPIUM-ALBUTEROL 0.5-2.5 (3) MG/3ML IN SOLN
3.0000 mL | Freq: Once | RESPIRATORY_TRACT | Status: AC
  Administered 2014-06-26: 3 mL via RESPIRATORY_TRACT
  Filled 2014-06-26: qty 3

## 2014-06-26 MED ORDER — IPRATROPIUM BROMIDE 0.02 % IN SOLN: 0.5000 mg | Freq: Once | RESPIRATORY_TRACT | Status: DC

## 2014-06-26 MED ORDER — ALBUTEROL SULFATE (2.5 MG/3ML) 0.083% IN NEBU: 5.0000 mg | INHALATION_SOLUTION | Freq: Once | RESPIRATORY_TRACT | Status: DC

## 2014-06-26 MED ORDER — PREDNISONE 10 MG PO TABS: ORAL_TABLET | ORAL | Status: DC

## 2014-06-26 NOTE — ED Notes (Signed)
Pt escorted to discharge window. Pt verbalized understanding discharge instructions. In no acute distress.  

## 2014-06-26 NOTE — ED Provider Notes (Signed)
CSN: [id number]     Arrival date & time 06/26/14  1652 History  This chart was scribed for non-physician practitioner, Jeannett Senior, PA-C working with Leota Jacobsen, MD by Frederich Balding, ED scribe. This patient was seen in room [facility] and the patient's care was started at 5:16 PM.   Chief Complaint  Patient presents with  . Chest Pain  . Shortness of Breath   The history is provided by the patient. No language interpreter was used.   HPI Comments: Deanna Schwartz is a 33 y.o. female with history of asthma who presents to the Emergency Department complaining of SOB that started this morning. Also reports intermittent chest tightness with coughing. States this feels similar to past asthma exacerbations. Cough, rhinorrhea and congestion started about one week ago. Pt has done 4 neb treatments at home and used her inhaler with temporary relief. Pt does not smoke cigarettes daily. Denies any fever or chills. States she feels like she has a cold coming on. No recent travel surgeries. No swelling in her legs. No history of blood clots. Denies being pregnant.  Past Medical History  Diagnosis Date  . Asthma   . Anxiety   . GERD (gastroesophageal reflux disease)   . Allergic rhinitis   . Chronic constipation   . Anemia   . H. pylori infection 2008    Hx of tx + serology  . Panic attack   . Irritable bowel syndrome   . Obesity   . 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 Iron deficiency anemia 08/06/2011    Hgb 10.2 and MCV 79 on health screen labs    Past Surgical History  Procedure Laterality Date  . Nasal sinus surgery    . Flexible sigmoidoscopy  04/14/2008    normal  . Upper gastrointestinal endoscopy     Family History  Problem Relation Age of Onset  . Diabetes      grandmother  . Hypertension    . Colon cancer Neg Hx   . Hypertension Mother   . Hypertension Father   . Diabetes Maternal Aunt   . Diabetes Maternal Aunt     History  Substance Use Topics  . Smoking status: Never Smoker   . Smokeless tobacco: Never Used  . Alcohol Use: Yes     Comment: Social maybe twice amonth, beer   OB History   Grav Para Term Preterm Abortions TAB SAB Ect Mult Living                 Review of Systems  HENT: Positive for congestion and rhinorrhea.   Respiratory: Positive for cough, chest tightness and shortness of breath.   All other systems reviewed and are negative.  Allergies  Augmentin; Cephalexin; and Iodine  Home Medications   Prior to Admission medications   Medication Sig Start Date End Date Taking? Authorizing Provider  Albuterol Sulfate (PROAIR RESPICLICK) 650 (90 BASE) MCG/ACT AEPB Inhale 1-2 puffs into the lungs 4 (four) times daily as needed. 05/09/14   Aleksei Plotnikov V, MD  cetirizine (ZYRTEC) 10 MG tablet Take 10 mg by mouth daily.    Historical Provider, MD  ciprofloxacin (CIPRO) 250 MG tablet Take 1 tablet (250 mg total) by mouth 2 (two) times daily. 06/21/14   Aleksei Plotnikov V, MD  Cyanocobalamin (VITAMIN B-12) 1000 MCG SUBL Place 1 tablet (1,000 mcg total) under the tongue daily. 06/21/14   Cassandria Anger, MD  Dexlansoprazole ([place]) 30  MG capsule Take 30 mg by mouth daily.    Historical Provider, MD  erythromycin ophthalmic ointment Place 1 application into both eyes 3 (three) times daily. 04/14/14   Aleksei Plotnikov V, MD  ferrous sulfate 325 (65 FE) MG tablet Take 325 mg by mouth daily with breakfast.    Historical Provider, MD  Fluticasone Furoate-Vilanterol (BREO ELLIPTA) 200-25 MCG/INH AEPB Inhale 1 Inhaler into the lungs daily. 05/09/14   Aleksei Plotnikov V, MD   BP 126/88  Pulse 115  Temp(Src) 98.2 F (36.8 C) (Oral)  Resp 24  SpO2 100%  LMP 06/10/2014  Physical Exam  Nursing note and vitals reviewed. Constitutional: She is oriented to person, place, and time. She appears well-developed and well-nourished. No distress.  HENT:  Head: Normocephalic and atraumatic.   Right Ear: External ear normal.  Left Ear: External ear normal.  Nose: Nose normal.  Mouth/Throat: Oropharynx is clear and moist.  Eyes: Conjunctivae and EOM are normal.  Neck: Normal range of motion. Neck supple. No tracheal deviation present.  Cardiovascular: Normal rate, regular rhythm and normal heart sounds.   No murmur heard. Pulmonary/Chest: Effort normal. No respiratory distress. She has wheezes. She has no rales.  Musculoskeletal: Normal range of motion. She exhibits no edema.  Neurological: She is alert and oriented to person, place, and time.  Skin: Skin is warm and dry.  Psychiatric: She has a normal mood and affect. Her behavior is normal.    ED Course  Procedures (including critical care time)  DIAGNOSTIC STUDIES: Oxygen Saturation is 100% on RA, normal by my interpretation.    COORDINATION OF CARE: 5:18 PM-Discussed treatment plan which includes steroids and chest xray with pt at bedside and pt agreed to plan.   Labs Review Labs Reviewed - No data to display  Imaging Review Dg Chest 2 View  06/26/2014   CLINICAL DATA:  Chest pain, shortness of breath.  EXAM: CHEST  2 VIEW  COMPARISON:  November 19, 2013.  FINDINGS: The heart size and mediastinal contours are within normal limits. Both lungs are clear. No pneumothorax or pleural effusion is noted. The visualized skeletal structures are unremarkable.  IMPRESSION: No acute cardiopulmonary abnormality seen.   Electronically Signed   By: Sabino Dick M.D.   On: 06/26/2014 18:20     EKG Interpretation None      MDM   Final diagnoses:  Asthma exacerbation   Patient with wheezing, shortness of breath. Already received one neb treatment prior to me seeing her. States feels better. She is in no distress. Denies any chest pain other than some back pain that she has had before with prior asthma. Will get EKG, chest x-ray given worsening cough, and try another breathing treatment. Steroids, prednisone ordered as  well.   6:40 PM Chest x-ray is negative. Patient now received 2 neb treatments. Pt feeling much better. Wheezing resolved. Home with cont neb treatments. Prednisone taper. Follow up with PCP as needed. VS including HR and oxygen sat normal.   Filed Vitals:   06/26/14 1656 06/26/14 1800  BP: 126/88   Pulse: 115 93  Temp: 98.2 F (36.8 C)   TempSrc: Oral   Resp: 24   SpO2: 100% 100%     I personally performed the services described in this documentation, which was scribed in my presence. The recorded information has been reviewed and is accurate.  Renold Genta, PA-C 06/26/14 1845

## 2014-06-26 NOTE — Discharge Instructions (Signed)
Continue neb treatments, and 1 treatment every 4 hours for the next 3 days. Prednisone as prescribed until all gone. He can try some guaifenesin or Robitussin cough syrup sold over-the-counter. Followup with primary care Dr. Return if worsening symptoms.   Asthma, Acute Bronchospasm Acute bronchospasm caused by asthma is also referred to as an asthma attack. Bronchospasm means your air passages become narrowed. The narrowing is caused by inflammation and tightening of the muscles in the air tubes (bronchi) in your lungs. This can make it hard to breathe or cause you to wheeze and cough. CAUSES Possible triggers are:  Animal dander from the skin, hair, or feathers of animals.  Dust mites contained in house dust.  Cockroaches.  Pollen from trees or grass.  Mold.  Cigarette or tobacco smoke.  Air pollutants such as dust, household cleaners, hair sprays, aerosol sprays, paint fumes, strong chemicals, or strong odors.  Cold air or weather changes. Cold air may trigger inflammation. Winds increase molds and pollens in the air.  Strong emotions such as crying or laughing hard.  Stress.  Certain medicines such as aspirin or beta-blockers.  Sulfites in foods and drinks, such as dried fruits and wine.  Infections or inflammatory conditions, such as a flu, cold, or inflammation of the nasal membranes (rhinitis).  Gastroesophageal reflux disease (GERD). GERD is a condition where stomach acid backs up into your esophagus.  Exercise or strenuous activity. SIGNS AND SYMPTOMS   Wheezing.  Excessive coughing, particularly at night.  Chest tightness.  Shortness of breath. DIAGNOSIS  Your health care provider will ask you about your medical history and perform a physical exam. A chest X-ray or blood testing may be performed to look for other causes of your symptoms or other conditions that may have triggered your asthma attack. TREATMENT  Treatment is aimed at reducing inflammation and  opening up the airways in your lungs. Most asthma attacks are treated with inhaled medicines. These include quick relief or rescue medicines (such as bronchodilators) and controller medicines (such as inhaled corticosteroids). These medicines are sometimes given through an inhaler or a nebulizer. Systemic steroid medicine taken by mouth or given through an IV tube also can be used to reduce the inflammation when an attack is moderate or severe. Antibiotic medicines are only used if a bacterial infection is present.  HOME CARE INSTRUCTIONS   Rest.  Drink plenty of liquids. This helps the mucus to remain thin and be easily coughed up. Only use caffeine in moderation and do not use alcohol until you have recovered from your illness.  Do not smoke. Avoid being exposed to secondhand smoke.  You play a critical role in keeping yourself in good health. Avoid exposure to things that cause you to wheeze or to have breathing problems.  Keep your medicines up-to-date and available. Carefully follow your health care provider's treatment plan.  Take your medicine exactly as prescribed.  When pollen or pollution is bad, keep windows closed and use an air conditioner or go to places with air conditioning.  Asthma requires careful medical care. See your health care provider for a follow-up as advised. If you are more than [redacted] weeks pregnant and you were prescribed any new medicines, let your obstetrician know about the visit and how you are doing. Follow up with your health care provider as directed.  After you have recovered from your asthma attack, make an appointment with your outpatient doctor to talk about ways to reduce the likelihood of future attacks. If you  do not have a doctor who manages your asthma, make an appointment with a primary care doctor to discuss your asthma. SEEK IMMEDIATE MEDICAL CARE IF:   You are getting worse.  You have trouble breathing. If severe, call your local emergency  services (911 in the [place]  You develop chest pain or discomfort.  You are vomiting.  You are not able to keep fluids down.  You are coughing up yellow, green, brown, or bloody sputum.  You have a fever and your symptoms suddenly get worse.  You have trouble swallowing. MAKE SURE YOU:   Understand these instructions.  Will watch your condition.  Will get help right away if you are not doing well or get worse. Document Released: 12/10/2006 Document Revised: 08/30/2013 Document Reviewed: 03/02/2013 [facility] Patient Information 2015 [place], [place]. This information is not intended to replace advice given to you by your health care provider. Make sure you discuss any questions you have with your health care provider.

## 2014-06-26 NOTE — ED Provider Notes (Signed)
Medical screening examination/treatment/procedure(s) were performed by non-physician practitioner and as supervising physician I was immediately available for consultation/collaboration.  Leota Jacobsen, MD 06/26/14 [phone]

## 2014-06-26 NOTE — ED Notes (Signed)
Pt reports waking up with severe SOB and cp this am, has hx of asthma.  Pt reports having neb txs without relief.

## 2014-06-29 ENCOUNTER — Ambulatory Visit: Payer: Self-pay | Admitting: Family

## 2014-06-29 ENCOUNTER — Encounter: Payer: Self-pay | Admitting: Internal Medicine

## 2014-06-30 MED ORDER — FLUCONAZOLE 150 MG PO TABS: 150.0000 mg | ORAL_TABLET | Freq: Once | ORAL | Status: DC

## 2014-06-30 NOTE — Telephone Encounter (Signed)
Yes absolutely will send in diflucan for her. Let her know take 1 pill, if no improvement by Sunday take another pill.

## 2014-07-12 ENCOUNTER — Other Ambulatory Visit (INDEPENDENT_AMBULATORY_CARE_PROVIDER_SITE_OTHER): Payer: Self-pay

## 2014-07-12 ENCOUNTER — Encounter: Payer: Self-pay | Admitting: Family Medicine

## 2014-07-12 ENCOUNTER — Ambulatory Visit (INDEPENDENT_AMBULATORY_CARE_PROVIDER_SITE_OTHER): Payer: Self-pay | Admitting: Family Medicine

## 2014-07-12 ENCOUNTER — Ambulatory Visit (INDEPENDENT_AMBULATORY_CARE_PROVIDER_SITE_OTHER)
Admission: RE | Admit: 2014-07-12 | Discharge: 2014-07-12 | Disposition: A | Discharge status: Home / Self Care | Payer: Self-pay | Source: Ambulatory Visit | Attending: Family Medicine | Admitting: Family Medicine

## 2014-07-12 VITALS — BP 104/72 | HR 74 | Ht 63.0 in | Wt 215.0 lb

## 2014-07-12 DIAGNOSIS — M25511 Pain in right shoulder: Secondary | ICD-10-CM

## 2014-07-12 DIAGNOSIS — S43109A Unspecified dislocation of unspecified acromioclavicular joint, initial encounter: Secondary | ICD-10-CM | POA: Insufficient documentation

## 2014-07-12 DIAGNOSIS — S43101A Unspecified dislocation of right acromioclavicular joint, initial encounter: Secondary | ICD-10-CM

## 2014-07-12 MED ORDER — KETOROLAC TROMETHAMINE 60 MG/2ML IM SOLN
60.0000 mg | Freq: Once | INTRAMUSCULAR | Status: AC
  Administered 2014-07-12: 60 mg via INTRAMUSCULAR

## 2014-07-12 NOTE — Patient Instructions (Signed)
Very nice to see you You separated your shoulder Ice 20 minutes 3 times a day Wear sling for comfort next week.  Try the pennsaid twice daily. Exercises starting in 1 week Xrays downstairs See me again in 10-14 days.

## 2014-07-12 NOTE — Progress Notes (Signed)
[name] [address], [place] [address] Phone: [phone] Subjective:     CC: acute right-sided shoulder pain  [id number] Deanna Schwartz is a 33 y.o. female coming in with complaint of right shoulder pain. Patient states that it started this morning. Patient dropped her daughters off at school and when she went home to start helping her other daughter with her hair she started having severe pain and was unable to lift the arm. Does not remember any true injury but was helping lift the fluid to help her husband do an oil change. Patient denies any radiation down the arm but any type of lifting causes a severe pain. Worse when she reaches across her body. Patient states that there is no significant neck pain associated with it. States that the severity is 9 out of 10 and is only been hours with the pain.     Past medical history, social, surgical and family history all reviewed in electronic medical record.   Review of Systems: No headache, visual changes, nausea, vomiting, diarrhea, constipation, dizziness, abdominal pain, skin rash, fevers, chills, night sweats, weight loss, swollen lymph nodes, body aches, joint swelling, muscle aches, chest pain, shortness of breath, mood changes.   Objective Blood pressure 104/72, pulse 74, height 5\' 3"  (1.6 m), weight 215 lb (97.523 kg), last menstrual period 06/10/2014, SpO2 98 %.  General: No apparent distress alert and oriented x3 mood and affect normal, dressed appropriately.  HEENT: Pupils equal, extraocular movements intact  Respiratory: Patient's speak in full sentences and does not appear short of breath  Cardiovascular: No lower extremity edema, non tender, no erythema  Skin: Warm dry intact with no signs of infection or rash on extremities or on axial skeleton.  Abdomen: Soft nontender  Neuro: Cranial nerves II through XII are intact, neurovascularly intact in all extremities with 2+ DTRs and  2+ pulses.  Lymph: No lymphadenopathy of posterior or anterior cervical chain or axillae bilaterally.  Gait normal with good balance and coordination.  MSK:  Non tender with full range of motion and good stability and symmetric strength and tone of  elbows, wrist, hip, knee and ankles bilaterally.  Shoulder: Right Inspection reveals no abnormalities, atrophy or asymmetry. Palpation is normal with no tenderness over AC joint or bicipital groove. ROM is full in all planes passively. Rotator cuff strength normal throughout. signs of impingement with positive Neer and Hawkin's tests, but negative empty can sign. Speeds and Yergason's tests normal. No labral pathology noted with negative Obrien's, negative clunk and good stability. Normal scapular function observed. No painful arc and no drop arm sign. No apprehension sign  MSK US performed of: Right This study was ordered, performed, and interpreted by Charlann Boxer D.O.  Shoulder:   Supraspinatus:  Appears normal on long and transverse views, Bursal bulge seen with shoulder abduction on impingement view.increasing Doppler flow also noted over the tendon Infraspinatus:  Appears normal on long and transverse views. Significant increase in Doppler flow Subscapularis:  Appears normal on long and transverse views. Positive bursapatient does have increasing Doppler flow over the tendon but no true tear appreciated Teres Minor:  Appears normal on long and transverse views. AC joint:  Mild dislocation noted. Patient has hypoechoic changes and questionable defect noted in the proximal aspect of the acromion at the joint.. Glenohumeral Joint:  Appears normal without effusion. Glenoid Labrum:  Intact without visualized tears. Biceps Tendon:  Appears normal on long and transverse views, no fraying of  tendon, tendon located in intertubercular groove, no subluxation with shoulder internal or external rotation.  Impression: questionable before meals joint  separation with hypoechoic changes and possible cortical defect. Rotator cuff tendinopathy noted    Impression and Recommendations:     This case required medical decision making of moderate complexity.

## 2014-07-12 NOTE — Assessment & Plan Note (Signed)
I believe the patient's injuries secondary to a type I before meals joint separation. Does appear on ultrasound the patient also has a very small fracture noted. Patient does have hypoechoic changes in this is within several hours of the injury which is concerning for further swelling. Patient was given a shot of Toradol for relief as well as put in a slowing. Patient was given a home exercise program to start in the next 72 hours for range of motion. We discussed topical anti-inflammatories and was given a trial. I do believe that there was also a potential for patient having a mild subluxation of the shoulder. This is concerning because there is no significant range of motion or lifting that occurred. Patient is going to come back though in 10 days for further evaluation or make sure she continues to improve. Patient continues to have focal pain over the before meals joint we can consider an injection. At this time we decided not to do that secondary to the possible cortical defect noted on ultrasound.  Spent greater than 25 minutes with patient face-to-face and had greater than 50% of counseling including as described above in assessment and plan.

## 2014-07-25 ENCOUNTER — Other Ambulatory Visit (HOSPITAL_COMMUNITY): Payer: Self-pay | Admitting: *Deleted

## 2014-07-25 DIAGNOSIS — N6310 Unspecified lump in the right breast, unspecified quadrant: Secondary | ICD-10-CM

## 2014-08-15 ENCOUNTER — Encounter (HOSPITAL_COMMUNITY): Payer: Self-pay | Admitting: *Deleted

## 2014-08-16 ENCOUNTER — Ambulatory Visit (HOSPITAL_COMMUNITY): Payer: Self-pay

## 2014-08-16 ENCOUNTER — Other Ambulatory Visit: Payer: Self-pay

## 2014-08-23 ENCOUNTER — Telehealth: Payer: Self-pay | Admitting: Family

## 2014-08-23 DIAGNOSIS — J019 Acute sinusitis, unspecified: Secondary | ICD-10-CM

## 2014-08-23 MED ORDER — AZITHROMYCIN 250 MG PO TABS: ORAL_TABLET | ORAL | Status: DC

## 2014-08-23 NOTE — Progress Notes (Signed)
We are sorry that you are not feeling well.  Here is how we plan to help!  Based on what you have shared with me it looks like you have sinusitis.  Sinusitis is inflammation and infection in the sinus cavities of the head.  Based on your presentation I believe you most likely have Acute Bacterial sinusitis.  This is an infection caused by bacteria and is treated with antibiotics.  I have prescribed Azithromyin 250 mg: two tables now and then one tablet daily for 4 additonal days   In addition you may use A non-prescription cough medication called Mucinex DM: take 2 tablets every 12 hours.   Sinus infections are not as easily transmitted as other respiratory infection, however we still recommend that you avoid close contact with loved ones, especially the very young and elderly.  Remember to wash your hands thoroughly throughout the day as this is the number one way to prevent the spread of infection!  Home Care:  Only take medications as instructed by your medical team.  Complete the entire course of an antibiotic.  Do not take these medications with alcohol.  A steam or ultrasonic humidifier can help congestion.  You can place a towel over your head and breathe in the steam from hot water coming from a faucet.  Avoid close contacts especially the very young and the elderly.  Cover your mouth when you cough or sneeze.  Always remember to wash your hands.  Get Help Right Away If:  You develop worsening fever or sinus pain.  You develop a severe head ache or visual changes.  Your symptoms persist after you have completed your treatment plan.  Make sure you  Understand these instructions.  Will watch your condition.  Will get help right away if you are not doing well or get worse.  Your e-visit answers were reviewed by a board certified advanced clinical practitioner to complete your personal care plan.  Depending on the condition, your plan could have included both over the  counter or prescription medications.  Please review your pharmacy choice.  If there is a problem, you may call our nursing hot line at [phone] and have the prescription routed to another pharmacy.  Your safety is important to [place].  If you have drug allergies check your prescription carefully.    You can use MyChart to ask questions about today's visit, request a non-urgent call back, or ask for a work or school excuse.  You will get an e-mail in the next two days asking about your experience.  I hope that your e-visit has been valuable and will speed your recovery. Thank you for using e-visits.

## 2014-09-04 ENCOUNTER — Encounter: Payer: Self-pay | Admitting: Internal Medicine

## 2014-09-06 ENCOUNTER — Telehealth: Payer: Self-pay | Admitting: *Deleted

## 2014-09-06 DIAGNOSIS — D509 Iron deficiency anemia, unspecified: Secondary | ICD-10-CM

## 2014-09-06 DIAGNOSIS — R5383 Other fatigue: Secondary | ICD-10-CM

## 2014-09-06 NOTE — Telephone Encounter (Signed)
MD ok for pt to have TSH, CBC, and Bmet entered labs in system...Johny Chess

## 2014-09-07 ENCOUNTER — Other Ambulatory Visit (INDEPENDENT_AMBULATORY_CARE_PROVIDER_SITE_OTHER): Payer: Self-pay

## 2014-09-07 DIAGNOSIS — R5383 Other fatigue: Secondary | ICD-10-CM

## 2014-09-07 DIAGNOSIS — D509 Iron deficiency anemia, unspecified: Secondary | ICD-10-CM

## 2014-09-07 LAB — BASIC METABOLIC PANEL
BUN: 12 mg/dL (ref 6–23)
CO2: 26 meq/L (ref 19–32)
Calcium: 8.9 mg/dL (ref 8.4–10.5)
Chloride: 106 mEq/L (ref 96–112)
Creatinine, Ser: 0.7 mg/dL (ref 0.4–1.2)
GFR: 135.37 mL/min (ref >60.00)
GLUCOSE: 88 mg/dL (ref 70–99)
Potassium: 3.9 mEq/L (ref 3.5–5.1)
SODIUM: 136 meq/L (ref 135–145)

## 2014-09-07 LAB — CBC WITH DIFFERENTIAL/PLATELET
BASOS ABS: 0 10*3/uL (ref 0.0–0.1)
Basophils Relative: 0.5 % (ref 0.0–3.0)
EOS ABS: 1.2 10*3/uL — AB (ref 0.0–0.7)
Eosinophils Relative: 15.4 % — ABNORMAL HIGH (ref 0.0–5.0)
HEMATOCRIT: 36 % (ref 36.0–46.0)
HEMOGLOBIN: 11.4 g/dL — AB (ref 12.0–15.0)
LYMPHS ABS: 2.2 10*3/uL (ref 0.7–4.0)
Lymphocytes Relative: 28.9 % (ref 12.0–46.0)
MCHC: 31.6 g/dL (ref 30.0–36.0)
MCV: 79.2 fl (ref 78.0–100.0)
MONO ABS: 0.4 10*3/uL (ref 0.1–1.0)
Monocytes Relative: 5.7 % (ref 3.0–12.0)
NEUTROS ABS: 3.8 10*3/uL (ref 1.4–7.7)
Neutrophils Relative %: 49.5 % (ref 43.0–77.0)
Platelets: 260 10*3/uL (ref 150.0–400.0)
RBC: 4.55 Mil/uL (ref 3.87–5.11)
RDW: 15.9 % — ABNORMAL HIGH (ref 11.5–15.5)
WBC: 7.6 10*3/uL (ref 4.0–10.5)

## 2014-09-11 LAB — TSH: TSH: 1.04 u[IU]/mL (ref 0.35–4.50)

## 2014-09-14 ENCOUNTER — Other Ambulatory Visit (HOSPITAL_COMMUNITY): Payer: Self-pay | Admitting: Obstetrics and Gynecology

## 2014-09-14 ENCOUNTER — Ambulatory Visit
Admission: RE | Admit: 2014-09-14 | Discharge: 2014-09-14 | Disposition: A | Discharge status: Home / Self Care | Payer: No Typology Code available for payment source | Source: Ambulatory Visit | Attending: Obstetrics and Gynecology | Admitting: Obstetrics and Gynecology

## 2014-09-14 ENCOUNTER — Ambulatory Visit (HOSPITAL_COMMUNITY)
Admission: RE | Admit: 2014-09-14 | Discharge: 2014-09-14 | Disposition: A | Discharge status: Home / Self Care | Payer: Self-pay | Source: Ambulatory Visit | Attending: Obstetrics and Gynecology | Admitting: Obstetrics and Gynecology

## 2014-09-14 ENCOUNTER — Encounter (HOSPITAL_COMMUNITY): Payer: Self-pay

## 2014-09-14 VITALS — BP 114/76 | Temp 98.5°F | Ht 63.0 in | Wt 212.6 lb

## 2014-09-14 DIAGNOSIS — M79622 Pain in left upper arm: Secondary | ICD-10-CM

## 2014-09-14 DIAGNOSIS — N6325 Unspecified lump in the left breast, overlapping quadrants: Secondary | ICD-10-CM

## 2014-09-14 DIAGNOSIS — N6310 Unspecified lump in the right breast, unspecified quadrant: Secondary | ICD-10-CM

## 2014-09-14 DIAGNOSIS — Z1239 Encounter for other screening for malignant neoplasm of breast: Secondary | ICD-10-CM

## 2014-09-14 DIAGNOSIS — N6320 Unspecified lump in the left breast, unspecified quadrant: Secondary | ICD-10-CM

## 2014-09-14 NOTE — Patient Instructions (Signed)
Explained to [facility] that she did not need a Pap smear today due to last Pap smear was 06/28/2014. Let her know [facility]CP will cover Pap smears every 3 years unless has a history of abnormal Pap smears. Referred patient to the [facility] for diagnostic mammogram. Appointment scheduled for Thursday, September 14, 2014 at 1545. Patient aware of appointment and will be there. Deanna Schwartz verbalized understanding.  [place], Arvil Chaco, RN 3:43 PM

## 2014-09-14 NOTE — Progress Notes (Signed)
Complaints of left breast lump since end of September 2015 and left axillary pain. Patient states pain comes and goes. Patient rates pain at a 3 out of 10.  Pap Smear:  Pap smear not completed today. Last Pap smear was 06/28/2014 at the [facility] and normal. Per patient has no history of an abnormal Pap smear. Last Pap smear result is scanned in EPIC under media.  Physical exam: Breasts Breasts symmetrical. No skin abnormalities bilateral breasts. No nipple retraction bilateral breasts. No nipple discharge bilateral breasts. No lymphadenopathy. No lumps palpated right breast. Palpated a lump that is about the size of a pea within the left breast at 12 o'clock in the areola. Complaints of left axillary tenderness on exam. Referred patient to the [facility] for diagnostic mammogram. Appointment scheduled for Thursday, September 14, 2014 at 1545.  Pelvic/Bimanual No Pap smear completed today since last Pap smear was 06/28/2014. Pap smear not indicated per BCCCP guidelines.

## 2014-09-20 ENCOUNTER — Encounter: Payer: Self-pay | Admitting: Internal Medicine

## 2014-09-21 NOTE — Telephone Encounter (Signed)
Needs OV today or tomorrow w/any provider Thx

## 2014-09-22 ENCOUNTER — Ambulatory Visit (INDEPENDENT_AMBULATORY_CARE_PROVIDER_SITE_OTHER): Payer: Self-pay | Admitting: Internal Medicine

## 2014-09-22 ENCOUNTER — Ambulatory Visit: Payer: Self-pay | Admitting: Internal Medicine

## 2014-09-22 ENCOUNTER — Encounter: Payer: Self-pay | Admitting: Internal Medicine

## 2014-09-22 VITALS — BP 120/72 | HR 94 | Temp 98.2°F | Ht 63.0 in | Wt 214.4 lb

## 2014-09-22 DIAGNOSIS — J309 Allergic rhinitis, unspecified: Secondary | ICD-10-CM

## 2014-09-22 DIAGNOSIS — J4521 Mild intermittent asthma with (acute) exacerbation: Secondary | ICD-10-CM

## 2014-09-22 DIAGNOSIS — J069 Acute upper respiratory infection, unspecified: Secondary | ICD-10-CM

## 2014-09-22 MED ORDER — LEVOFLOXACIN 500 MG PO TABS: 500.0000 mg | ORAL_TABLET | Freq: Every day | ORAL | Status: DC

## 2014-09-22 MED ORDER — HYDROCODONE-HOMATROPINE 5-1.5 MG/5ML PO SYRP: 5.0000 mL | ORAL_SOLUTION | Freq: Four times a day (QID) | ORAL | Status: DC | PRN

## 2014-09-22 MED ORDER — PREDNISONE 10 MG PO TABS: ORAL_TABLET | ORAL | Status: DC

## 2014-09-22 MED ORDER — METHYLPREDNISOLONE ACETATE 80 MG/ML IJ SUSP
80.0000 mg | Freq: Once | INTRAMUSCULAR | Status: DC
Start: 2014-09-22 — End: 2015-02-03

## 2014-09-22 NOTE — Progress Notes (Signed)
Pre visit review using our clinic review tool, if applicable. No additional management support is needed unless otherwise documented below in the visit note. 

## 2014-09-22 NOTE — Patient Instructions (Addendum)
Please return if you change your mind about the steroid shot   Please take all new medication as prescribed - the antibiotic, cough medicine, and prednisone  Please continue all other medications as before, including the inhaler  Please have the pharmacy call with any other refills you may need.  Please keep your appointments with your specialists as you may have planned

## 2014-09-22 NOTE — Progress Notes (Signed)
Subjective:    Patient ID: Deanna Schwartz, female    DOB: 06/01/1981, 34 y.o.   MRN: [id number]  HPI  Here with 1 wk acute onset fever, facial pain, pressure, headache, general weakness and malaise, and greenish d/c, with mild ST and cough, but pt denies chest pain, wheezing, increased sob or doe, orthopnea, PND, increased LE swelling, palpitations, dizziness or syncope, except for worsening sob/wheezing in last 3 days, worse at night to sleep. Today using inhaler every 2 hrs. Does have several wks ongoing nasal allergy symptoms with clearish congestion, itch and sneezing, without fever, pain, ST, cough, swelling or wheezing.  Past Medical History  Diagnosis Date  . Asthma   . Anxiety   . GERD (gastroesophageal reflux disease)   . Allergic rhinitis   . Chronic constipation   . Anemia   . H. pylori infection 2008    Hx of tx + serology  . Panic attack   . Irritable bowel syndrome   . Obesity   . 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 Iron deficiency anemia 08/06/2011    Hgb 10.2 and MCV 79 on health screen labs    Past Surgical History  Procedure Laterality Date  . Nasal sinus surgery    . Flexible sigmoidoscopy  04/14/2008    normal  . Upper gastrointestinal endoscopy      reports that she has never smoked. She has never used smokeless tobacco. She reports that she drinks alcohol. She reports that she does not use illicit drugs. family history includes Diabetes in her maternal aunt, maternal aunt, paternal grandmother, and another family member; Hypertension in her father, mother, and another family member. There is no history of Colon cancer. Allergies  Allergen Reactions  . Augmentin [Amoxicillin-Pot Clavulanate] Rash  . Cephalexin Rash  . Iodine Swelling and Rash    Throat swelling   Current Outpatient Prescriptions on File Prior to Visit  Medication Sig Dispense Refill  . Albuterol Sulfate 108 (90 BASE) MCG/ACT AEPB Inhale 2  puffs into the lungs every 4 (four) hours as needed (wheezing).    . cholecalciferol (VITAMIN D) 1000 UNITS tablet Take 1,000 Units by mouth daily.    . Cyanocobalamin (VITAMIN B-12) 1000 MCG SUBL Place 1 tablet (1,000 mcg total) under the tongue daily. 100 tablet 3  . Dexlansoprazole (DEXILANT) 30 MG capsule Take 30 mg by mouth daily.    . ferrous sulfate 325 (65 FE) MG tablet Take 325 mg by mouth daily with breakfast.    . vitamin C (ASCORBIC ACID) 500 MG tablet Take 500 mg by mouth daily.     No current facility-administered medications on file prior to visit.   Review of Systems  Constitutional: Negative for unusual diaphoresis or other sweats  HENT: Negative for ringing in ear Eyes: Negative for double vision or worsening visual disturbance.  Respiratory: Negative for choking and stridor.   Gastrointestinal: Negative for vomiting or other signifcant bowel change Genitourinary: Negative for hematuria or decreased urine volume.  Musculoskeletal: Negative for other MSK pain or swelling Skin: Negative for color change and worsening wound.  Neurological: Negative for tremors and numbness other than noted  Psychiatric/Behavioral: Negative for decreased concentration or agitation other than above       Objective:   Physical Exam BP 120/72 mmHg  Pulse 94  Temp(Src) 98.2 F (36.8 C) (Oral)  Ht 5\' 3"  (1.6 m)  Wt 214 lb 6 oz (97.24 kg)  BMI  37.98 kg/m2  SpO2 96%  LMP 09/08/2014 VS noted, mild ill Constitutional: Pt appears well-developed, well-nourished.  HENT: Head: NCAT.  Right Ear: External ear normal.  Left Ear: External ear normal.  Eyes: . Pupils are equal, round, and reactive to light. Conjunctivae and EOM are normal Bilat tm's with mild erythema.  Max sinus areas on tender.  Pharynx with mild erythema, no exudate Neck: Normal range of motion. Neck supple. with tender submandibular LA Cardiovascular: Normal rate and regular rhythm.   Pulmonary/Chest: Effort normal and  breath sounds without rales, but + mild diffuse wheezing.  Neurological: Pt is alert. Not confused , motor grossly intact Skin: Skin is warm. No rash Psychiatric: Pt behavior is normal. No agitation.     Assessment & Plan:

## 2014-09-23 NOTE — Assessment & Plan Note (Signed)
Also to improve with prednisone,  to f/u any worsening symptoms or concerns 

## 2014-09-23 NOTE — Assessment & Plan Note (Addendum)
Mild exac, for prednisone asd,  to f/u any worsening symptoms or concerns, cont inhaler use

## 2014-09-23 NOTE — Assessment & Plan Note (Signed)
Mild to mod, for antibx course,  to f/u any worsening symptoms or concerns 

## 2014-10-02 ENCOUNTER — Telehealth: Payer: Self-pay | Admitting: Internal Medicine

## 2014-10-02 ENCOUNTER — Encounter (HOSPITAL_COMMUNITY): Payer: Self-pay | Admitting: Emergency Medicine

## 2014-10-02 ENCOUNTER — Emergency Department (HOSPITAL_COMMUNITY): Payer: Self-pay

## 2014-10-02 ENCOUNTER — Emergency Department (HOSPITAL_COMMUNITY)
Admission: EM | Admit: 2014-10-02 | Discharge: 2014-10-03 | Disposition: A | Discharge status: Home / Self Care | Payer: Self-pay | Attending: Emergency Medicine | Admitting: Emergency Medicine

## 2014-10-02 DIAGNOSIS — E669 Obesity, unspecified: Secondary | ICD-10-CM | POA: Insufficient documentation

## 2014-10-02 DIAGNOSIS — Z79899 Other long term (current) drug therapy: Secondary | ICD-10-CM | POA: Insufficient documentation

## 2014-10-02 DIAGNOSIS — J45901 Unspecified asthma with (acute) exacerbation: Secondary | ICD-10-CM

## 2014-10-02 DIAGNOSIS — Z792 Long term (current) use of antibiotics: Secondary | ICD-10-CM | POA: Insufficient documentation

## 2014-10-02 DIAGNOSIS — M549 Dorsalgia, unspecified: Secondary | ICD-10-CM | POA: Insufficient documentation

## 2014-10-02 DIAGNOSIS — Z8659 Personal history of other mental and behavioral disorders: Secondary | ICD-10-CM | POA: Insufficient documentation

## 2014-10-02 DIAGNOSIS — E538 Deficiency of other specified B group vitamins: Secondary | ICD-10-CM | POA: Insufficient documentation

## 2014-10-02 DIAGNOSIS — D509 Iron deficiency anemia, unspecified: Secondary | ICD-10-CM | POA: Insufficient documentation

## 2014-10-02 DIAGNOSIS — R0602 Shortness of breath: Secondary | ICD-10-CM

## 2014-10-02 DIAGNOSIS — Z8619 Personal history of other infectious and parasitic diseases: Secondary | ICD-10-CM | POA: Insufficient documentation

## 2014-10-02 DIAGNOSIS — R000 Tachycardia, unspecified: Secondary | ICD-10-CM | POA: Insufficient documentation

## 2014-10-02 DIAGNOSIS — K219 Gastro-esophageal reflux disease without esophagitis: Secondary | ICD-10-CM | POA: Insufficient documentation

## 2014-10-02 MED ORDER — PREDNISONE 20 MG PO TABS
60.0000 mg | ORAL_TABLET | Freq: Once | ORAL | Status: AC
  Administered 2014-10-02: 60 mg via ORAL
  Filled 2014-10-02: qty 3

## 2014-10-02 MED ORDER — FAMOTIDINE IN NACL 20-0.9 MG/50ML-% IV SOLN
20.0000 mg | Freq: Once | INTRAVENOUS | Status: AC
  Administered 2014-10-02: 20 mg via INTRAVENOUS
  Filled 2014-10-02: qty 50

## 2014-10-02 MED ORDER — ALBUTEROL SULFATE (2.5 MG/3ML) 0.083% IN NEBU
5.0000 mg | INHALATION_SOLUTION | Freq: Once | RESPIRATORY_TRACT | Status: AC
  Administered 2014-10-03: 5 mg via RESPIRATORY_TRACT
  Filled 2014-10-02: qty 6

## 2014-10-02 MED ORDER — IPRATROPIUM BROMIDE 0.02 % IN SOLN
0.5000 mg | Freq: Once | RESPIRATORY_TRACT | Status: AC
  Administered 2014-10-03: 0.5 mg via RESPIRATORY_TRACT
  Filled 2014-10-02: qty 2.5

## 2014-10-02 MED ORDER — SODIUM CHLORIDE 0.9 % IV SOLN
Freq: Once | INTRAVENOUS | Status: AC
  Administered 2014-10-02: 23:00:00 via INTRAVENOUS

## 2014-10-02 MED ORDER — IPRATROPIUM BROMIDE 0.02 % IN SOLN
0.5000 mg | Freq: Once | RESPIRATORY_TRACT | Status: AC
  Administered 2014-10-02: 0.5 mg via RESPIRATORY_TRACT
  Filled 2014-10-02: qty 2.5

## 2014-10-02 MED ORDER — ALBUTEROL SULFATE (2.5 MG/3ML) 0.083% IN NEBU
5.0000 mg | INHALATION_SOLUTION | Freq: Once | RESPIRATORY_TRACT | Status: AC
Start: 2014-10-02 — End: 2014-10-02
  Administered 2014-10-02: 5 mg via RESPIRATORY_TRACT
  Filled 2014-10-02: qty 6

## 2014-10-02 MED ORDER — MAGNESIUM SULFATE 2 GM/50ML IV SOLN
2.0000 g | INTRAVENOUS | Status: AC
  Administered 2014-10-02: 2 g via INTRAVENOUS
  Filled 2014-10-02: qty 50

## 2014-10-02 NOTE — Telephone Encounter (Signed)
Patient requesting alternative to   predniSONE (DELTASONE) 10 MG tablet [[id number]]    . Her symptoms have not improved since she began taking it.

## 2014-10-02 NOTE — ED Notes (Signed)
Pt has hx of Asthma and states she has had SOB since Saturday. Pt reports using inhaler and nebulizer treatment at home with no relief. Pt is alert and oriented with slightly labored breathing.

## 2014-10-02 NOTE — ED Provider Notes (Signed)
CSN: [id number]     Arrival date & time 10/02/14  2136 History   First MD Initiated Contact with Patient 10/02/14 2146     Chief Complaint  Patient presents with  . Shortness of Breath   Patient is a 34 y.o. female presenting with shortness of breath. The history is provided by the patient. No language interpreter was used.  Shortness of Breath Associated symptoms: wheezing    This chart was scribed for non-physician practitioner Antonietta Breach, PA-C,  working with Threasa Beards, MD, by Thea Alken, ED Scribe. This patient was seen in room WTR6/WTR6 and the patient's care was started at 11:20 PM.  Deanna Schwartz is a 34 y.o. female with hx of asthma who presents to the Emergency Department complaining of asthma. Pt reports associated wheezing, SOB chest tightness and back pain worsened with activity. Pt has also had chest pain with deep breathing which is a new symptom. Pt has tried albuterol inhaler and a nebulizer treatment at home with minimal relief for a short period of time. Pt has hx of hospitalizations due to asthma. Pt denies fever, nausea, emesis, hemoptysis, and calf/leg swelling. Pt denies recent travels outside of the country. Pt denies being on birth control. She denies recent surgeries or hospitalizations.   Past Medical History  Diagnosis Date  . Asthma   . Anxiety   . GERD (gastroesophageal reflux disease)   . Allergic rhinitis   . Chronic constipation   . Anemia   . H. pylori infection 2008    Hx of tx + serology  . Panic attack   . Irritable bowel syndrome   . Obesity   . 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 Iron deficiency anemia 08/06/2011    Hgb 10.2 and MCV 79 on health screen labs    Past Surgical History  Procedure Laterality Date  . Nasal sinus surgery    . Flexible sigmoidoscopy  04/14/2008    normal  . Upper gastrointestinal endoscopy     Family History  Problem Relation Age of Onset  . Diabetes     grandmother  . Hypertension    . Colon cancer Neg Hx   . Hypertension Mother   . Hypertension Father   . Diabetes Maternal Aunt   . Diabetes Maternal Aunt   . Diabetes Paternal Grandmother    History  Substance Use Topics  . Smoking status: Never Smoker   . Smokeless tobacco: Never Used  . Alcohol Use: Yes     Comment: Social maybe twice amonth, beer   OB History    Gravida Para Term Preterm AB TAB SAB Ectopic Multiple Living   4 3 3  1  1   3      Review of Systems  Respiratory: Positive for chest tightness, shortness of breath and wheezing.   Musculoskeletal: Positive for back pain.  All other systems reviewed and are negative.   Allergies  Augmentin; Cephalexin; and Iodine  Home Medications   Prior to Admission medications   Medication Sig Start Date End Date Taking? Authorizing Provider  Albuterol Sulfate 108 (90 BASE) MCG/ACT AEPB Inhale 2 puffs into the lungs every 4 (four) hours as needed (wheezing).   Yes Historical Provider, MD  cholecalciferol (VITAMIN D) 1000 UNITS tablet Take 1,000 Units by mouth daily.   Yes Historical Provider, MD  Cyanocobalamin (VITAMIN B-12) 1000 MCG SUBL Place 1 tablet (1,000 mcg total) under the tongue daily. 06/21/14  Yes  Aleksei Plotnikov V, MD  Dexlansoprazole (DEXILANT) 30 MG capsule Take 30 mg by mouth daily.   Yes Historical Provider, MD  ferrous sulfate 325 (65 FE) MG tablet Take 325 mg by mouth daily with breakfast.   Yes Historical Provider, MD  vitamin C (ASCORBIC ACID) 500 MG tablet Take 500 mg by mouth daily.   Yes Historical Provider, MD  HYDROcodone-homatropine (HYCODAN) 5-1.5 MG/5ML syrup Take 5 mLs by mouth every 6 (six) hours as needed for cough. 09/22/14   Biagio Borg, MD  levofloxacin (LEVAQUIN) 500 MG tablet Take 1 tablet (500 mg total) by mouth daily. 09/22/14   Biagio Borg, MD  predniSONE (DELTASONE) 10 MG tablet 3 tabs by mouth per day for 3 days,2tabs per day for 3 days,1tab per day for 3 days Patient not taking:  Reported on 10/02/2014 09/22/14   Biagio Borg, MD   BP 130/75 mmHg  Pulse 110  Temp(Src) 98.7 F (37.1 C) (Oral)  Resp 26  SpO2 100%  LMP 09/08/2014   Physical Exam  Constitutional: She is oriented to person, place, and time. She appears well-developed and well-nourished. No distress.  Nontoxic/nonseptic appearing  HENT:  Head: Normocephalic and atraumatic.  Mouth/Throat: Oropharynx is clear and moist.  Eyes: Conjunctivae and EOM are normal. No scleral icterus.  Neck: Normal range of motion.  Cardiovascular: Regular rhythm and normal heart sounds.   Tachycardia, mild. Patient endorses albuterol txs PTA; currently on albuterol neb.  Pulmonary/Chest: No respiratory distress. She has no wheezes. She has rhonchi. She has no rales.  Mild dyspnea without tachypnea. Diffuse expiratory rhonchi. No rales appreciated. No accessory muscle use.  Musculoskeletal: Normal range of motion.  Neurological: She is alert and oriented to person, place, and time. She exhibits normal muscle tone. Coordination normal.  GCS 15. Patient moving extremities without ataxia.  Skin: Skin is warm and dry. No rash noted. She is not diaphoretic. No erythema. No pallor.  Psychiatric: She has a normal mood and affect. Her behavior is normal.  Nursing note and vitals reviewed.   ED Course  Procedures (including critical care time) DIAGNOSTIC STUDIES: Oxygen Saturation is 100% on RA, normal by my interpretation.    COORDINATION OF CARE: 11:20 PM- Pt advised of plan for treatment and pt agrees.  Labs Review Labs Reviewed - No data to display  Imaging Review Dg Chest 2 View  10/02/2014   CLINICAL DATA:  34 year old female with a history of shortness of breath. History of asthma  EXAM: CHEST - 2 VIEW  COMPARISON:  06/26/2014  FINDINGS: Cardiomediastinal silhouette projects within normal limits in size and contour. No confluent airspace disease, pneumothorax, or pleural effusion.  No displaced fracture.   Unremarkable appearance of the upper abdomen.  IMPRESSION: No radiographic evidence of acute cardiopulmonary disease.  Signed,  Dulcy Fanny. Earleen Newport, DO  [facility]  [facility]   Electronically Signed   By: Corrie Mckusick D.O.   On: 10/02/2014 23:13     EKG Interpretation None      MDM   Final diagnoses:  Shortness of breath    Patient ambulated in ED without hypoxia, no current signs of respiratory distress. Lung exam improved after nebulizer treatment; lungs CTAB after DuoNeb x 2. Prednisone and MgSO4 given in the ED as well as Pepcid given hx of reflux. Patient to be discharged with 5 day course of prednisone for further management of her asthma symptoms. Patient states they are breathing at baseline. She has been instructed  to continue using prescribed medications and to speak with PCP about today's exacerbation. Return precautions discussed and provided. Patient agreeable to plan with no unaddressed concerns.  I personally performed the services described in this documentation, which was scribed in my presence. The recorded information has been reviewed and is accurate.    Antonietta Breach, PA-C 10/03/14 0405  Threasa Beards, MD 10/03/14 [phone]

## 2014-10-03 MED ORDER — ALBUTEROL SULFATE HFA 108 (90 BASE) MCG/ACT IN AERS
2.0000 | INHALATION_SPRAY | Freq: Once | RESPIRATORY_TRACT | Status: AC
  Administered 2014-10-03: 2 via RESPIRATORY_TRACT
  Filled 2014-10-03: qty 6.7

## 2014-10-03 MED ORDER — PREDNISONE 20 MG PO TABS: 40.0000 mg | ORAL_TABLET | Freq: Every day | ORAL | Status: DC

## 2014-10-03 NOTE — Telephone Encounter (Signed)
OV w/any provider pls Thx 

## 2014-10-03 NOTE — Telephone Encounter (Signed)
Called patient and scheduled ER follow up for next Wednesday

## 2014-10-03 NOTE — ED Notes (Signed)
Respiratory called and made aware of the neb treatment order. States that she is currently busy and would take a while before she could come administer the treatment. RN is aware

## 2014-10-03 NOTE — Discharge Instructions (Signed)

## 2014-10-04 ENCOUNTER — Encounter: Payer: Self-pay | Admitting: Internal Medicine

## 2014-10-04 ENCOUNTER — Other Ambulatory Visit (INDEPENDENT_AMBULATORY_CARE_PROVIDER_SITE_OTHER): Payer: Self-pay

## 2014-10-04 ENCOUNTER — Ambulatory Visit (INDEPENDENT_AMBULATORY_CARE_PROVIDER_SITE_OTHER): Payer: Self-pay | Admitting: Internal Medicine

## 2014-10-04 ENCOUNTER — Telehealth: Payer: Self-pay | Admitting: *Deleted

## 2014-10-04 VITALS — BP 110/74 | HR 79 | Temp 98.6°F | Resp 16 | Ht 63.0 in | Wt 212.2 lb

## 2014-10-04 DIAGNOSIS — J4521 Mild intermittent asthma with (acute) exacerbation: Secondary | ICD-10-CM

## 2014-10-04 DIAGNOSIS — T148 Other injury of unspecified body region: Secondary | ICD-10-CM

## 2014-10-04 DIAGNOSIS — M609 Myositis, unspecified: Secondary | ICD-10-CM

## 2014-10-04 DIAGNOSIS — M791 Myalgia: Secondary | ICD-10-CM

## 2014-10-04 DIAGNOSIS — IMO0001 Reserved for inherently not codable concepts without codable children: Secondary | ICD-10-CM

## 2014-10-04 DIAGNOSIS — T148XXA Other injury of unspecified body region, initial encounter: Secondary | ICD-10-CM

## 2014-10-04 LAB — BASIC METABOLIC PANEL
BUN: 14 mg/dL (ref 6–23)
CHLORIDE: 106 meq/L (ref 96–112)
CO2: 27 mEq/L (ref 19–32)
Calcium: 9.7 mg/dL (ref 8.4–10.5)
Creatinine, Ser: 0.79 mg/dL (ref 0.40–1.20)
GFR: 108.03 mL/min (ref >60.00)
GLUCOSE: 101 mg/dL — AB (ref 70–99)
Potassium: 4.5 mEq/L (ref 3.5–5.1)
SODIUM: 140 meq/L (ref 135–145)

## 2014-10-04 LAB — CBC WITH DIFFERENTIAL/PLATELET
Basophils Absolute: 0 10*3/uL (ref 0.0–0.1)
Basophils Relative: 0.3 % (ref 0.0–3.0)
EOS PCT: 0.1 % (ref 0.0–5.0)
Eosinophils Absolute: 0 10*3/uL (ref 0.0–0.7)
HEMATOCRIT: 37.3 % (ref 36.0–46.0)
Hemoglobin: 12.2 g/dL (ref 12.0–15.0)
Lymphocytes Relative: 19.4 % (ref 12.0–46.0)
Lymphs Abs: 3.1 10*3/uL (ref 0.7–4.0)
MCHC: 32.8 g/dL (ref 30.0–36.0)
MCV: 78.2 fl (ref 78.0–100.0)
Monocytes Absolute: 0.9 10*3/uL (ref 0.1–1.0)
Monocytes Relative: 5.7 % (ref 3.0–12.0)
NEUTROS PCT: 74.5 % (ref 43.0–77.0)
Neutro Abs: 11.9 10*3/uL — ABNORMAL HIGH (ref 1.4–7.7)
PLATELETS: 349 10*3/uL (ref 150.0–400.0)
RBC: 4.78 Mil/uL (ref 3.87–5.11)
RDW: 15 % (ref 11.5–15.5)
WBC: 15.9 10*3/uL — ABNORMAL HIGH (ref 4.0–10.5)

## 2014-10-04 MED ORDER — BUDESONIDE-FORMOTEROL FUMARATE 160-4.5 MCG/ACT IN AERO: 2.0000 | INHALATION_SPRAY | Freq: Two times a day (BID) | RESPIRATORY_TRACT | Status: DC

## 2014-10-04 NOTE — Patient Instructions (Addendum)
Your next office appointment will be determined based upon review of your pending labs . Those instructions will be transmitted to you through My Chart  . Critical values will be called  Followup as needed for your acute issue. Please report any significant change in your symptoms. Albuterol is a rescue inhaler which should be used as infrequently as possible; it should never be used more than 1-2 puffs every 4 hours. It may  be used 15-30 minutes before exercise if that is also  a trigger for your asthma.   If it is required more than 2-3 times per week; the asthma is not well controlled. Symbicort , Dulera , Advair or other maintenance agent should be considered to control smooth muscle spasm and airway inflammation  and to prevent adverse effects from excess albuterol use.Those adverse effects can include health or life threatening heart rhythm irregularities. .  Plain Mucinex (NOT D) for thick secretions ;force NON dairy fluids .   Nasal cleansing in the shower as discussed with lather of mild shampoo.After 10 seconds wash off lather while  exhaling through nostrils. Make sure that all residual soap is removed to prevent irritation.  Flonase OR Nasacort AQ 1 spray in each nostril twice a day as needed. Use the "crossover" technique into opposite nostril spraying toward opposite ear @ 45 degree angle, not straight up into nostril.  Plain Allegra (NOT D )  160 daily , Loratidine 10 mg , OR Zyrtec 10 mg @ bedtime  as needed for itchy eyes & sneezing.

## 2014-10-04 NOTE — Progress Notes (Signed)
   Subjective:    Patient ID: Deanna Schwartz, female    DOB: 09/10/1980, 34 y.o.   MRN: [id number]  HPI She was seen in emergency room 10/03/14 with a typical asthma flare associated with shortness of breath, chest tightness, wheezing, and some substernal chest pain with deep inspiration. She had used her nebulizer metered-dose inhaler prior to going to the emergency room without benefit  In the emergency room she was given nebulizer treatments 2 as well as IV mag sulfate and Pepcid. She was discharged on oral prednisone 40 mg daily. She was given a 60 mg dose there in the emergency room.  O2 sats were 100 percent on room air. Chest x-ray was negative   She has albuterol metered-dose inhaler at home as well as a nebulizer. She does not have a maintenance asthma therapy.  She also was seen for respiratory tract infection symptoms on 1/16 @ this site. She was prescribed Levaquin and a narcotic cough syrup which she did not fill. 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  Review of Systems  She now describes yellow slightly bloody material from the right nare. Sputum is clear. She has some sneezing.  She's been having some leg discomfort with 40 mg of prednisone.  Also she's noted bruising.     Objective:   Physical Exam Pertinent positive findings include marked inflammation and dryness of the nares; this is greater on the right than the left. There is no active bleeding She has an S4 Isolated low-grade wheezing present w/oincreased WOB. Minor scattered bruising noted.  General appearance:Adequately nourished; no acute distress or increased work of breathing is present.  No  lymphadenopathy about the head, neck, or axilla noted.  Eyes: No conjunctival inflammation or lid edema is present. There is no scleral icterus. Ears:  External ear exam shows no significant lesions or deformities.  Otoscopic examination  reveals clear canals, tympanic membranes are intact bilaterally without bulging, retraction, inflammation or discharge. Nose:  External nasal examination shows no deformity or inflammation. No septal dislocation or deviation.No obstruction to airflow.  Oral exam: Dental hygiene is good; lips and gums are healthy appearing.There is no oropharyngeal erythema or exudate noted.  Neck:  No deformities, thyromegaly, masses, or tenderness noted.   Supple with full range of motion without pain.  Heart:  Normal rate and regular rhythm. S1 and S2 normal without gallop, murmur, click, or rub .  Extremities:  No cyanosis, edema, or clubbing  noted  Skin: Warm & dry w/o  tenting.       Assessment & Plan:  #1 asthma exacerbation, improved. Not on maintenance inhaled steroid  #2 myalgias with the prednisone  #3 bruising  Plan: See orders and recommendations

## 2014-10-04 NOTE — Telephone Encounter (Signed)
[facility] Night - Client TELEPHONE [facility] Patient Name: [facility] O Gender: Female DOB: Feb 27, 1981 Age: 34 Y 90 M 18 D Return Phone Number: [phone] (Primary) Address: [address] City/State/Zip: [place] [address] Client [place] Night - Client Client Site [place] - Night Physician Plotnikov, Alex Contact Type Call Call Type Triage / Clinical Relationship To Patient Self Return Phone Number [phone] (Primary) Chief Complaint ASTHMA ATTACK Initial Comment Caller states she has sx of an asthma attack and short of breath. PreDisposition Go to ED Nurse Assessment Nurse: Lovena Le, RN, Truman Hayward Date/Time Eilene Ghazi Time): 10/02/2014 8:05:48 PM Confirm and document reason for call. If symptomatic, describe symptoms. ---Caller states she is having asthma attack and using albuterol inhaler more often than every 4 hrs. Still short of breath and wheezing. Has the patient traveled out of the country within the last 30 days? ---Not Applicable Does the patient require triage? ---Yes Related visit to physician within the last 2 weeks? ---Yes Does the PT have any chronic conditions? (i.e. diabetes, asthma, etc.) ---Yes List chronic conditions. ---asthma, just been on prednisone and got off on Tues or Wed but sx came back Sat. Did the patient indicate they were pregnant? ---No Guidelines Guideline Title Affirmed Question Affirmed Notes Nurse Date/Time Eilene Ghazi Time) Asthma Attack Chest pain Margarita Sermons 10/02/2014 8:08:27 PM Disp. Time Eilene Ghazi Time) Disposition Final User 10/02/2014 8:04:11 PM Send to Urgent Allie Bossier 10/02/2014 8:09:50 PM Go to ED Now Yes Lovena Le, RN, Windell Hummingbird Understands: Yes Disagree/Comply: Comply PLEASE NOTE: All timestamps contained within this report are represented as [place] Standard Time. CONFIDENTIALTY NOTICE: This fax transmission is  intended only for the addressee. It contains information that is legally privileged, confidential or otherwise protected from use or disclosure. If you are not the intended recipient, you are strictly prohibited from reviewing, disclosing, copying using or disseminating any of this information or taking any action in reliance on or regarding this information. If you have received this fax in error, please notify us immediately by telephone so that we can arrange for its return to [place]. Phone: [phone], Toll-Free: [phone], Fax: [phone] Page: 2 of 2 Call Id: [id number] Care Advice Given Per Guideline GO TO ED NOW: You need to be seen in the Emergency Department. Go to the ER at ___________ [place] now. Drive carefully. NOTE TO TRIAGER - DRIVING: * Another adult should drive. ASTHMA QUICK-RELIEF INHALER (e.g., albuterol, salbutamol, Xopenex): Take 4 puffs on your quick-relief inhaler right now. BRING MEDICINES: CARE ADVICE given per Asthma Attack (Adult) guideline. * It is also a good idea to bring the pill bottles too. This will help the doctor to make certain you are taking the right medicines and the right dose. After Care Instructions Given Call Event Type User Date / Time Description Referrals [facility] - ED

## 2014-10-04 NOTE — Progress Notes (Signed)
Pre visit review using our clinic review tool, if applicable. No additional management support is needed unless otherwise documented below in the visit note. 

## 2014-10-05 LAB — MAGNESIUM: MAGNESIUM: 2.2 mg/dL (ref 1.5–2.5)

## 2014-10-11 ENCOUNTER — Ambulatory Visit: Payer: Self-pay | Admitting: Internal Medicine

## 2014-10-15 ENCOUNTER — Telehealth: Payer: Self-pay | Admitting: Family

## 2014-10-15 DIAGNOSIS — J0121 Acute recurrent ethmoidal sinusitis: Secondary | ICD-10-CM

## 2014-10-15 NOTE — Progress Notes (Signed)
Based on what you shared with me it looks like you have a serious condition that should be evaluated in a face to face office visit.  I have reviewed your chart thoroughly and I am concerned that you did not respond well to the Levaquin that was prescribed to you for 10 days about three weeks ago, which should have been ending its course approximately 10 days ago. That, in combination with your history of nasal surgery warrants a closer evaluation which can only be done face-to-face. Please go to your primary care physician so that you can be accurately diagnosed and treated.  If you are having a true medical emergency please call 911.  If you need an urgent face to face visit, [facility] has four urgent care centers for your convenience.  . [facility] a Provider at this Location  [address] [place], [place] [address] . 8 am to 8 pm Monday-Friday . 9 am to 7 pm Saturday-Sunday  . [facility] [place] a Provider at this Location  [place], [place], [place] [address] . 8 am to 8 pm Monday-Friday . 9 am to 6 pm Saturday . 11 am to 6 pm Sunday   . [facility] [place] Get Driving Directions  [address] Suite [place], [place] [address] . 8 am to 8 pm Monday-Friday . 9 am to 4 pm Saturday-Sunday   . [facility] (a walk in primary care provider)  [place] a Provider at this Location  [place], [place] [address] . 8 am to 8:30 pm Monday-Thursday . 8 am to 6 pm Friday . 8 am to 4 pm Saturday-Sunday   Your e-visit answers were reviewed by a board certified advanced clinical practitioner to complete your personal care plan.  Depending on the condition, your plan could have included both over the counter or prescription  medications.  You will get an e-mail in the next two days asking about your experience.  I hope that your e-visit has been valuable and will speed your recovery . Thank you for choosing an e-visit.

## 2014-11-09 ENCOUNTER — Telehealth: Payer: Self-pay | Admitting: Internal Medicine

## 2014-11-09 ENCOUNTER — Other Ambulatory Visit (INDEPENDENT_AMBULATORY_CARE_PROVIDER_SITE_OTHER): Payer: Self-pay

## 2014-11-09 DIAGNOSIS — Z0000 Encounter for general adult medical examination without abnormal findings: Secondary | ICD-10-CM

## 2014-11-09 LAB — URINALYSIS, ROUTINE W REFLEX MICROSCOPIC
BILIRUBIN URINE: NEGATIVE
Ketones, ur: NEGATIVE
Leukocytes, UA: NEGATIVE
NITRITE: NEGATIVE
Specific Gravity, Urine: 1.025 (ref 1.000–1.030)
Total Protein, Urine: NEGATIVE
Urine Glucose: NEGATIVE
Urobilinogen, UA: 0.2 (ref 0.0–1.0)
pH: 6 (ref 5.0–8.0)

## 2014-11-09 LAB — CBC WITH DIFFERENTIAL/PLATELET
Basophils Absolute: 0.1 10*3/uL (ref 0.0–0.1)
Basophils Relative: 0.7 % (ref 0.0–3.0)
EOS PCT: 11.3 % — AB (ref 0.0–5.0)
Eosinophils Absolute: 0.9 10*3/uL — ABNORMAL HIGH (ref 0.0–0.7)
HCT: 35.7 % — ABNORMAL LOW (ref 36.0–46.0)
Hemoglobin: 11.7 g/dL — ABNORMAL LOW (ref 12.0–15.0)
Lymphocytes Relative: 27.3 % (ref 12.0–46.0)
Lymphs Abs: 2.1 10*3/uL (ref 0.7–4.0)
MCHC: 32.9 g/dL (ref 30.0–36.0)
MCV: 77.4 fl — ABNORMAL LOW (ref 78.0–100.0)
Monocytes Absolute: 0.4 10*3/uL (ref 0.1–1.0)
Monocytes Relative: 5.2 % (ref 3.0–12.0)
Neutro Abs: 4.3 10*3/uL (ref 1.4–7.7)
Neutrophils Relative %: 55.5 % (ref 43.0–77.0)
PLATELETS: 306 10*3/uL (ref 150.0–400.0)
RBC: 4.61 Mil/uL (ref 3.87–5.11)
RDW: 14.1 % (ref 11.5–15.5)
WBC: 7.8 10*3/uL (ref 4.0–10.5)

## 2014-11-09 LAB — HEPATIC FUNCTION PANEL
ALBUMIN: 4.1 g/dL (ref 3.5–5.2)
ALK PHOS: 73 U/L (ref 39–117)
ALT: 17 U/L (ref 0–35)
AST: 21 U/L (ref 0–37)
Bilirubin, Direct: 0.1 mg/dL (ref 0.0–0.3)
TOTAL PROTEIN: 7.7 g/dL (ref 6.0–8.3)
Total Bilirubin: 0.3 mg/dL (ref 0.2–1.2)

## 2014-11-09 LAB — TSH: TSH: 1.06 u[IU]/mL (ref 0.35–4.50)

## 2014-11-09 LAB — BASIC METABOLIC PANEL
BUN: 15 mg/dL (ref 6–23)
CALCIUM: 9.4 mg/dL (ref 8.4–10.5)
CO2: 31 mEq/L (ref 19–32)
CREATININE: 0.81 mg/dL (ref 0.40–1.20)
Chloride: 103 mEq/L (ref 96–112)
GFR: 104.9 mL/min (ref >60.00)
Glucose, Bld: 81 mg/dL (ref 70–99)
Potassium: 4.5 mEq/L (ref 3.5–5.1)
Sodium: 138 mEq/L (ref 135–145)

## 2014-11-09 LAB — LIPID PANEL
CHOL/HDL RATIO: 3
Cholesterol: 180 mg/dL (ref 0–200)
HDL: 52.3 mg/dL (ref >39.00)
LDL CALC: 97 mg/dL (ref 0–99)
NONHDL: 127.7
Triglycerides: 154 mg/dL — ABNORMAL HIGH (ref 0.0–149.0)
VLDL: 30.8 mg/dL (ref 0.0–40.0)

## 2014-11-09 NOTE — Telephone Encounter (Signed)
CPE labs entered.  

## 2014-11-09 NOTE — Telephone Encounter (Signed)
Patient is in the lab.  The lab says she needs an order for the labs for her physical.

## 2014-11-13 ENCOUNTER — Ambulatory Visit (INDEPENDENT_AMBULATORY_CARE_PROVIDER_SITE_OTHER): Payer: 59 | Admitting: Internal Medicine

## 2014-11-13 ENCOUNTER — Encounter: Payer: Self-pay | Admitting: Internal Medicine

## 2014-11-13 VITALS — BP 116/70 | HR 90 | Temp 98.5°F | Ht 63.0 in | Wt 217.5 lb

## 2014-11-13 DIAGNOSIS — M2550 Pain in unspecified joint: Secondary | ICD-10-CM

## 2014-11-13 DIAGNOSIS — J4540 Moderate persistent asthma, uncomplicated: Secondary | ICD-10-CM

## 2014-11-13 DIAGNOSIS — E538 Deficiency of other specified B group vitamins: Secondary | ICD-10-CM | POA: Insufficient documentation

## 2014-11-13 DIAGNOSIS — J4521 Mild intermittent asthma with (acute) exacerbation: Secondary | ICD-10-CM

## 2014-11-13 DIAGNOSIS — Z0000 Encounter for general adult medical examination without abnormal findings: Secondary | ICD-10-CM

## 2014-11-13 DIAGNOSIS — R635 Abnormal weight gain: Secondary | ICD-10-CM

## 2014-11-13 MED ORDER — FERROUS SULFATE 325 (65 FE) MG PO TABS: 325.0000 mg | ORAL_TABLET | Freq: Every day | ORAL | Status: DC

## 2014-11-13 MED ORDER — MONTELUKAST SODIUM 10 MG PO TABS: 10.0000 mg | ORAL_TABLET | Freq: Every day | ORAL | Status: DC

## 2014-11-13 MED ORDER — BUDESONIDE-FORMOTEROL FUMARATE 160-4.5 MCG/ACT IN AERO: 2.0000 | INHALATION_SPRAY | Freq: Two times a day (BID) | RESPIRATORY_TRACT | Status: DC

## 2014-11-13 NOTE — Progress Notes (Signed)
Pre visit review using our clinic review tool, if applicable. No additional management support is needed unless otherwise documented below in the visit note. 

## 2014-11-13 NOTE — Assessment & Plan Note (Signed)
Low carb diet 

## 2014-11-13 NOTE — Assessment & Plan Note (Signed)
B12 def Re-start Rx Risks associated with treatment noncompliance were discussed. Compliance was encouraged.

## 2014-11-13 NOTE — Assessment & Plan Note (Signed)
FMS Options discussed Yoga, pilates Vit D

## 2014-11-13 NOTE — Progress Notes (Signed)
   Subjective:    HPI  The patient is here for a wellness exam.   C/o wt gain  F/u chest tightness and wheezing off and on. Symbicort is too $$$ C/o wt gain  Wt Readings from Last 3 Encounters:  11/13/14 217 lb 8 oz (98.657 kg)  10/04/14 212 lb 4 oz (96.276 kg)  09/22/14 214 lb 6 oz (97.24 kg)   BP Readings from Last 3 Encounters:  11/13/14 116/70  10/04/14 110/74  10/02/14 130/75      Review of Systems  Constitutional: Positive for unexpected weight change. Negative for chills, activity change, appetite change and fatigue.  HENT: Negative for congestion, mouth sores and sinus pressure.   Eyes: Negative for visual disturbance.  Respiratory: Negative for cough and chest tightness.   Gastrointestinal: Negative for nausea and abdominal pain.  Genitourinary: Negative for frequency, difficulty urinating and vaginal pain.  Musculoskeletal: Negative for back pain and gait problem.  Skin: Negative for pallor and rash.  Neurological: Negative for dizziness, tremors, weakness, numbness and headaches.  Psychiatric/Behavioral: Negative for confusion and sleep disturbance.       Objective:   Physical Exam  Constitutional: She appears well-developed. No distress.  Obese  HENT:  Head: Normocephalic.  Right Ear: External ear normal.  Left Ear: External ear normal.  Nose: Nose normal.  Mouth/Throat: Oropharynx is clear and moist.  Eyes: Conjunctivae are normal. Pupils are equal, round, and reactive to light. Right eye exhibits no discharge. Left eye exhibits no discharge.  Neck: Normal range of motion. Neck supple. No JVD present. No tracheal deviation present. No thyromegaly present.  Cardiovascular: Normal rate, regular rhythm and normal heart sounds.   Pulmonary/Chest: No stridor. No respiratory distress. She has no wheezes.  Abdominal: Soft. Bowel sounds are normal. She exhibits no distension and no mass. There is no tenderness. There is no rebound and no guarding.    Musculoskeletal: She exhibits no edema or tenderness.  Lymphadenopathy:    She has no cervical adenopathy.  Neurological: She displays normal reflexes. No cranial nerve deficit. She exhibits normal muscle tone. Coordination normal.  Skin: No rash noted. No erythema.  Psychiatric: She has a normal mood and affect. Her behavior is normal. Judgment and thought content normal.     Lab Results  Component Value Date   WBC 7.8 11/09/2014   HGB 11.7* 11/09/2014   HCT 35.7* 11/09/2014   PLT 306.0 11/09/2014   GLUCOSE 81 11/09/2014   CHOL 180 11/09/2014   TRIG 154.0* 11/09/2014   HDL 52.30 11/09/2014   LDLCALC 97 11/09/2014   ALT 17 11/09/2014   AST 21 11/09/2014   NA 138 11/09/2014   K 4.5 11/09/2014   CL 103 11/09/2014   CREATININE 0.81 11/09/2014   BUN 15 11/09/2014   CO2 31 11/09/2014   TSH 1.06 11/09/2014   HGBA1C 5.2 04/21/2012        Assessment & Plan:

## 2014-11-13 NOTE — Patient Instructions (Signed)
Low carb diet   Fibromyalgia Fibromyalgia is a disorder that is often misunderstood. It is associated with muscular pains and tenderness that comes and goes. It is often associated with fatigue and sleep disturbances. Though it tends to be long-lasting, fibromyalgia is not life-threatening. CAUSES  The exact cause of fibromyalgia is unknown. People with certain gene types are predisposed to developing fibromyalgia and other conditions. Certain factors can play a role as triggers, such as:  Spine disorders.  Arthritis.  Severe injury (trauma) and other physical stressors.  Emotional stressors. SYMPTOMS   The main symptom is pain and stiffness in the muscles and joints, which can vary over time.  Sleep and fatigue problems. Other related symptoms may include:  Bowel and bladder problems.  Headaches.  Visual problems.  Problems with odors and noises.  Depression or mood changes.  Painful periods (dysmenorrhea).  Dryness of the skin or eyes. DIAGNOSIS  There are no specific tests for diagnosing fibromyalgia. Patients can be diagnosed accurately from the specific symptoms they have. The diagnosis is made by determining that nothing else is causing the problems. TREATMENT  There is no cure. Management includes medicines and an active, healthy lifestyle. The goal is to enhance physical fitness, decrease pain, and improve sleep. HOME CARE INSTRUCTIONS   Only take over-the-counter or prescription medicines as directed by your caregiver. Sleeping pills, tranquilizers, and pain medicines may make your problems worse.  Low-impact aerobic exercise is very important and advised for treatment. At first, it may seem to make pain worse. Gradually increasing your tolerance will overcome this feeling.  Learning relaxation techniques and how to control stress will help you. Biofeedback, visual imagery, hypnosis, muscle relaxation, yoga, and meditation are all options.  Anti-inflammatory  medicines and physical therapy may provide short-term help.  Acupuncture or massage treatments may help.  Take muscle relaxant medicines as suggested by your caregiver.  Avoid stressful situations.  Plan a healthy lifestyle. This includes your diet, sleep, rest, exercise, and friends.  Find and practice a hobby you enjoy.  Join a fibromyalgia support group for interaction, ideas, and sharing advice. This may be helpful. SEEK MEDICAL CARE IF:  You are not having good results or improvement from your treatment. FOR MORE INFORMATION  National Fibromyalgia Association: www.fmaware.[place]: www.arthritis.org Document Released: 08/25/2005 Document Revised: 11/17/2011 Document Reviewed: 12/05/2009 [facility] Patient Information 2015 [place], [place]. This information is not intended to replace advice given to you by your health care provider. Make sure you discuss any questions you have with your health care provider.

## 2014-11-13 NOTE — Assessment & Plan Note (Signed)
MDIs Rx: Symbicort Start Singulair 10 mg/d Hold off milk x 2-3 wks

## 2014-11-13 NOTE — Assessment & Plan Note (Addendum)
We discussed age appropriate health related issues, including available/recomended screening tests and vaccinations. We discussed a need for adhering to healthy diet and exercise. Labs/EKG were reviewed/ordered. All questions were answered. Low carb diet

## 2014-11-14 ENCOUNTER — Telehealth: Payer: Self-pay

## 2014-11-14 NOTE — Telephone Encounter (Signed)
Rec'd fax from [facility] requested PA for Symbicort 160-4.5 / inhale 2 puffs intor the lungs two times daily  Call placed to insurance carrier, [employer] at [phone] and representative will fax over PA form.   Status; awaiting PA fax

## 2014-11-16 MED ORDER — MOMETASONE FURO-FORMOTEROL FUM 200-5 MCG/ACT IN AERO: 2.0000 | INHALATION_SPRAY | Freq: Two times a day (BID) | RESPIRATORY_TRACT | Status: DC

## 2014-11-16 NOTE — Telephone Encounter (Signed)
Ok [place] Thx

## 2014-11-16 NOTE — Telephone Encounter (Signed)
PA for Symbicort 160-4.5 mcg was completed and denied because the mbr has not used Advair HFA or diskus. PA requires trial of Advair. Do you want to change the prescription?

## 2014-11-16 NOTE — Telephone Encounter (Signed)
Plan will also cover Alves[place], Dulera, Advair, Asmanex, QVar or Breo Ellipta. Please advise.

## 2014-11-17 NOTE — Telephone Encounter (Signed)
Called pt- no answer/unable to leave vm. Will try again later.  

## 2014-11-21 NOTE — Telephone Encounter (Signed)
Pt informed

## 2014-12-04 ENCOUNTER — Encounter: Payer: Self-pay | Admitting: Internal Medicine

## 2014-12-06 ENCOUNTER — Other Ambulatory Visit: Payer: Self-pay | Admitting: Internal Medicine

## 2014-12-06 DIAGNOSIS — J320 Chronic maxillary sinusitis: Secondary | ICD-10-CM

## 2014-12-19 ENCOUNTER — Telehealth: Payer: Self-pay | Admitting: *Deleted

## 2014-12-19 DIAGNOSIS — R519 Headache, unspecified: Secondary | ICD-10-CM

## 2014-12-19 DIAGNOSIS — R51 Headache: Principal | ICD-10-CM

## 2014-12-19 NOTE — Telephone Encounter (Signed)
Pt is requesting referral to [facility] for HAs.

## 2014-12-19 NOTE — Telephone Encounter (Signed)
OK. Thx

## 2015-01-03 ENCOUNTER — Ambulatory Visit (INDEPENDENT_AMBULATORY_CARE_PROVIDER_SITE_OTHER): Payer: 59 | Admitting: Internal Medicine

## 2015-01-03 ENCOUNTER — Encounter: Payer: Self-pay | Admitting: Internal Medicine

## 2015-01-03 VITALS — BP 112/62 | HR 72 | Ht 63.0 in | Wt 215.0 lb

## 2015-01-03 DIAGNOSIS — G8929 Other chronic pain: Secondary | ICD-10-CM | POA: Diagnosis not present

## 2015-01-03 DIAGNOSIS — R0789 Other chest pain: Secondary | ICD-10-CM

## 2015-01-03 DIAGNOSIS — D500 Iron deficiency anemia secondary to blood loss (chronic): Secondary | ICD-10-CM | POA: Diagnosis not present

## 2015-01-03 MED ORDER — SALONPAS 7-15 % EX GEL: 1.0000 "application " | Freq: Four times a day (QID) | CUTANEOUS | Status: DC | PRN

## 2015-01-03 MED ORDER — MELOXICAM 15 MG PO TABS: 15.0000 mg | ORAL_TABLET | Freq: Every day | ORAL | Status: DC

## 2015-01-03 NOTE — Patient Instructions (Signed)
We have sent the following medications to your pharmacy for you to pick up at your convenience: Generic mobic  Give [place] an update in a month please.   I appreciate the opportunity to care for you.

## 2015-01-03 NOTE — Assessment & Plan Note (Signed)
meloxicam x 1-3 months salonpas prn To f/u w/ me by phone or in person 1 month

## 2015-01-03 NOTE — Progress Notes (Signed)
   Subjective:    Patient ID: Deanna Schwartz, female    DOB: 1981/04/05, 34 y.o.   MRN: [id number] Cc: LUQ and flank pain  HPI 2+ yrs of chronic intermittent and sharp LUQ, flank and back pains. Last x hrs. Tender No clear triggers - has not been coughing or sneezing No relief Has voltaren gel but has used sporadically and self-limited  She is moving bowels ok qd-q2d 7d menses still Periods increase pain sxs  Can disturb sleep  Also can get some central chest pain which is similar Medications, allergies, past medical history, past surgical history, family history and social history are reviewed and updated in the EMR.  Review of Systems As above - now back full time doing precertification    Objective:   Physical Exam BP 112/62 mmHg  Pulse 72  Ht 5\' 3"  (1.6 m)  Wt 215 lb (97.523 kg)  BMI 38.09 kg/m2  LMP 12/25/2014 NAD, obese Tender left chest wall ant, lat and post Spine ok LUQ nontender with and w//o mm tension   Reviewed prior GInotes recent PCP notes and all imaging 2016-2013 2016 labs in EMR    Assessment & Plan:  Chronic chest wall pain - mostly left side meloxicam x 1-3 months salonpas prn To f/u w/ me by phone or in person 1 month    Iron deficiency anemia due to chronic blood loss - menses Back on ferrous sulfate

## 2015-01-03 NOTE — Assessment & Plan Note (Signed)
Back on ferrous sulfate

## 2015-01-05 ENCOUNTER — Encounter: Payer: Self-pay | Admitting: Internal Medicine

## 2015-01-10 ENCOUNTER — Ambulatory Visit: Payer: 59 | Admitting: Internal Medicine

## 2015-01-22 ENCOUNTER — Encounter: Payer: Self-pay | Admitting: Neurology

## 2015-01-22 ENCOUNTER — Ambulatory Visit (INDEPENDENT_AMBULATORY_CARE_PROVIDER_SITE_OTHER): Payer: 59 | Admitting: Neurology

## 2015-01-22 VITALS — BP 118/80 | HR 88 | Ht 64.57 in | Wt 217.0 lb

## 2015-01-22 DIAGNOSIS — M542 Cervicalgia: Secondary | ICD-10-CM | POA: Diagnosis not present

## 2015-01-22 DIAGNOSIS — E669 Obesity, unspecified: Secondary | ICD-10-CM | POA: Insufficient documentation

## 2015-01-22 DIAGNOSIS — G44209 Tension-type headache, unspecified, not intractable: Secondary | ICD-10-CM | POA: Diagnosis not present

## 2015-01-22 MED ORDER — AMITRIPTYLINE HCL 10 MG PO TABS: 10.0000 mg | ORAL_TABLET | Freq: Every day | ORAL | Status: DC

## 2015-01-22 NOTE — Progress Notes (Signed)
[facility]   Provider:  Larey Seat, M D  Referring Provider: Cassandria Anger, MD Primary Care Physician:  [name], MD  Chief Complaint  Patient presents with  . Headache    rm 10, new patient, alone    HPI:  Deanna Schwartz is a 34 y.o. female seen here as a referral from Dr. Alain Marion for headaches,  Mr, Hults describes that her headache concern started about 2 months ago. She states that mostly her" back of the neck " hurts, as she  points to the back and  the occipital area and paraspinal area radiating down to the shoulders.  She feels as if she is a movement and she is actually not moving a dizziness lightheadedness and she feels as if the room is spinning around her this is the second complaint, by description of vertigo. Gait and dizziness are not related and arrive at different times. She reports that she does not usually wake up with headaches but the headaches developed during the day as to get worse as the day goes on. She has been prescribed meloxicam but has not  Filled her PCPs prescription, instead she has taken ibuprofen and had success, and her headaches resolve very promptly after she's taking it. She has resumed to take it about every other day but not daily and is aware of the danger of rebound headaches.  She does not report associated nausea with her headaches, there is no associated phono- or photo phobia. Last for about an hour if untreated. They occur occur every day sometimes every other day. She also went to an ENT physician who proved that she had a sinus infection at the time which is now treated, she has not tolerated the ENTs antibiotic prescription but was given a prednisone Dosepak. She does not recall her ENt doctors name or the name of the drug he prescribed ( Levoquin) . She has not kept a headache diary. After the prednisone dosepack headaches in the forehead resolved. But she still has neck pain and occipital headaches.     She reports left flank pain, no back pain, neck pain and tenderness to the shoulder line.   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     Review of Systems: Out of a complete 14 system review, the patient complains of only the following symptoms, and all other reviewed systems are negative.  headaches, ASthma, weight gain, neck tenderness. she reports no vision changes,  no bladder or bowel incontinence, no back pain per se. She points to the left flank and has pain there she describes a very sore lower rib cage on the left side., she denies depression.      History   Social History  . Marital Status: Married    Spouse Name: N/A  . Number of Children: 3  . Years of Education: N/A   Occupational History  . [employer] [place]   Social History Main Topics  . Smoking status: Never Smoker   . Smokeless tobacco: Never Used  . Alcohol Use: Yes     Comment: Social maybe twice amonth, beer  . Drug Use: No  . Sexual Activity: Yes    Birth Control/ Protection: None   Other Topics Concern  . Not on file   Social History Narrative   HSG, [employer] in [place]. Occupation: [employer]. married  in Oct 2009. Son born in 2009,  2 dtrs - '07, '11. Marriage in good health.    Family History  Problem Relation Age of Onset  . Diabetes      grandmother  . Hypertension    . Colon cancer Neg Hx   . Hypertension Mother   . Hypertension Father   . Diabetes Maternal Aunt   . Diabetes Maternal Aunt   . Diabetes Paternal Grandmother   . Colon polyps Paternal Uncle     x4    Past Medical History  Diagnosis Date  . Asthma   . Anxiety   . GERD (gastroesophageal reflux disease)   . Allergic rhinitis   . Chronic constipation   . Anemia   . H. pylori infection 2008    Hx of tx + serology  . Panic attack   . Irritable bowel syndrome   . Obesity   . 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 Iron deficiency anemia 08/06/2011    Hgb 10.2 and MCV 79 on health screen labs     Past Surgical History  Procedure Laterality Date  . Nasal sinus surgery    . Flexible sigmoidoscopy  04/14/2008    normal  . Upper gastrointestinal endoscopy      Current Outpatient Prescriptions  Medication Sig Dispense Refill  . budesonide-formoterol (SYMBICORT) 160-4.5 MCG/ACT inhaler Inhale 2 puffs into the lungs 2 (two) times daily. 1 Inhaler 3  . cholecalciferol (VITAMIN D) 1000 UNITS tablet Take 1,000 Units by mouth daily.    . Cyanocobalamin (VITAMIN B-12) 1000 MCG SUBL Place 1 tablet (1,000 mcg total) under the tongue daily. 100 tablet 3  . dexlansoprazole (DEXILANT) 60 MG capsule Take 60 mg by mouth daily.    . ferrous sulfate 325 (65 FE) MG tablet Take 1 tablet (325 mg total) by mouth daily with breakfast. 100 tablet 1  . montelukast (SINGULAIR) 10 MG tablet Take 1 tablet (10 mg total) by mouth daily. 90 tablet 3  . meloxicam (MOBIC) 15 MG tablet Take 1 tablet (15 mg total) by mouth daily. (Patient not taking: Reported on 01/22/2015) 90 tablet 0  . Menthol-Methyl Salicylate (SALONPAS) 7-82 % GEL Apply 1 application topically 4 (four) times daily as needed. (Patient not taking: Reported on 01/22/2015)  0  . mometasone-formoterol (DULERA) 200-5 MCG/ACT AERO Inhale 2 puffs into the lungs 2 (two) times daily. (Patient not taking: Reported on 01/22/2015) 1 Inhaler 5  . predniSONE (DELTASONE) 20 MG tablet Take 20 mg by mouth daily with breakfast. Pt uses for sinus     Current Facility-Administered Medications  Medication Dose Route Frequency Provider Last Rate Last Dose  . methylPREDNISolone acetate (DEPO-MEDROL) injection 80 mg  80 mg Intramuscular Once Biagio Borg, MD        Allergies as of 01/22/2015 - Review Complete 01/22/2015  Allergen Reaction Noted  . Augmentin [amoxicillin-pot clavulanate] Rash 11/05/2013  . Cephalexin Rash 08/11/2008  . Iodine Swelling and Rash  04/07/2008    Vitals: BP 118/80 mmHg  Pulse 88  Ht 5' 4.57" (1.64 m)  Wt 217 lb (98.431 kg)  BMI 36.60 kg/m2  LMP 12/25/2014 Last Weight:  Wt Readings from Last 1 Encounters:  01/22/15 217 lb (98.431 kg)       Last Height:   Ht Readings from Last 1 Encounters:  01/22/15 5' 4.57" (1.64 m)    Physical exam:  General: The patient is awake, alert and appears not in acute distress. The patient is well groomed.  Head: Normocephalic, atraumatic. Neck is supple. Mallampati 1 ,  neck circumference:14. Nasal airflow  Partially restricted , TMJ is not  evident . Retrognathia is not seen.  Cardiovascular:  Regular rate and rhythm , without  murmurs or carotid bruit, and without distended neck veins. Respiratory: Lungs are clear to auscultation. Skin:  Without evidence of edema, or rash Trunk: BMI is  elevated and patient has normal posture.  Neurologic exam : The patient is awake and alert, oriented to place and time.   Memory subjective   described as intact. There is a normal attention span & concentration ability.  Speech is fluent without dysarthria, dysphonia or aphasia. Mood and affect are appropriate.  Cranial nerves: Pupils are equal and briskly reactive to light. Funduscopic exam without evidence of pallor or edema.  Extraocular movements  in vertical and horizontal planes intact and without nystagmus. Visual fields by finger perimetry are intact. Hearing to finger rub intact.  Facial sensation intact to fine touch. Facial motor strength is symmetric and tongue and uvula move midline.  Motor exam:   Normal tone, muscle bulk and symmetric ,strength in all extremities.  Sensory:  Fine touch, pinprick and vibration were tested in all extremities.  Proprioception is tested in the upper extremities only. This was is  normal.  Coordination: Rapid alternating movements in the fingers/hands is normal. Finger-to-nose maneuver  normal without evidence of ataxia, dysmetria or  tremor.  Gait and station: Patient walks without assistive device and is able unassisted to climb up to the exam table. Strength within normal limits.  Stance is stable and normal. Tandem gait is unfragmented. Romberg testing is negative.  Deep tendon reflexes: in the  upper and lower extremities are symmetric and intact. Babinski maneuver response is  downgoing.   Assessment:  After physical and neurologic examination, review of laboratory studies, imaging, neurophysiology testing and pre-existing records, assessment is   1) global( generalized ) Headachce after arising from the neck, and occiput.  2) dull, pounding , throbbing headaches in left temple- improved after prednisone treatment of sinusitis  3) the patients neck pain has a muscle tension component. Ibuprofen does help but only temporarily the headache will return at least is in a day. She usually does not have multiple headaches during the day. Her for her to use a muscle relaxer overnight since she has regular work hours this would help her to sleep deeper and would also allow her to break the recurrent pattern of the muscle tension headaches. In addition she would be allowed to take the ibuprofen as needed but I predict that she will need less and less frequent. I would like to try a try cyclic such as amitriptyline or Elavil for this. Her flank and ribcage pain is of unknown origin, but already there for years. Kidney stones and UTI have been ruled out.    The patient was advised of the nature of the diagnosed sleep disorder , the treatment options and risks for general a health and wellness arising from not treating the condition. Visit duration was 30 minutes.   Plan:  Treatment plan and additional workup :  The presumed diagnosis of tension headache I will  start Elavil at a low dose of 10 mg at night. I would like her to take it about an hour before intended bed that time. She was advised of blurred vision, fry mouth and  drowsiness . I have offered the patient physical therapy but it is hard for her work showed [employer]  to get seen by physical therapy without losing time at work. If she can arrange for it I would like her to see a multimodality approach with heat and cold as well as for some ultrasound to soften  the paraspinal tissue.   I thank Dr. Alain Marion for this pleasant  patient's referral to my care.      Asencion Partridge Conchita Truxillo MD  01/22/2015

## 2015-01-22 NOTE — Patient Instructions (Signed)
Amitriptyline tablets What is this medicine? AMITRIPTYLINE (a mee TRIP ti leen) is used to treat depression. This medicine may be used for other purposes; ask your health care provider or pharmacist if you have questions. COMMON BRAND NAME(S): Elavil, Vanatrip What should I tell my health care provider before I take this medicine? They need to know if you have any of these conditions: -an alcohol problem -asthma, difficulty breathing -bipolar disorder or schizophrenia -difficulty passing urine, prostate trouble -glaucoma -heart disease or previous heart attack -liver disease -over active thyroid -seizures -thoughts or plans of suicide, a previous suicide attempt, or family history of suicide attempt -an unusual or allergic reaction to amitriptyline, other medicines, foods, dyes, or preservatives -pregnant or trying to get pregnant -breast-feeding How should I use this medicine? Take this medicine by mouth with a drink of water. Follow the directions on the prescription label. You can take the tablets with or without food. Take your medicine at regular intervals. Do not take it more often than directed. Do not stop taking this medicine suddenly except upon the advice of your doctor. Stopping this medicine too quickly may cause serious side effects or your condition may worsen. A special MedGuide will be given to you by the pharmacist with each prescription and refill. Be sure to read this information carefully each time. Talk to your pediatrician regarding the use of this medicine in children. Special care may be needed. Overdosage: If you think you have taken too much of this medicine contact a poison control center or emergency room at once. NOTE: This medicine is only for you. Do not share this medicine with others. What if I miss a dose? If you miss a dose, take it as soon as you can. If it is almost time for your next dose, take only that dose. Do not take double or extra doses. What  may interact with this medicine? Do not take this medicine with any of the following medications: -arsenic trioxide -certain medicines used to regulate abnormal heartbeat or to treat other heart conditions -cisapride -droperidol -halofantrine -linezolid -MAOIs like Carbex, Eldepryl, Marplan, Nardil, and Parnate -methylene blue -other medicines for mental depression -phenothiazines like perphenazine, thioridazine and chlorpromazine -pimozide -probucol -procarbazine -sparfloxacin -St. John's Wort -ziprasidone This medicine may also interact with the following medications: -atropine and related drugs like hyoscyamine, scopolamine, tolterodine and others -barbiturate medicines for inducing sleep or treating seizures, like phenobarbital -cimetidine -disulfiram -ethchlorvynol -thyroid hormones such as levothyroxine This list may not describe all possible interactions. Give your health care provider a list of all the medicines, herbs, non-prescription drugs, or dietary supplements you use. Also tell them if you smoke, drink alcohol, or use illegal drugs. Some items may interact with your medicine. What should I watch for while using this medicine? Tell your doctor if your symptoms do not get better or if they get worse. Visit your doctor or health care professional for regular checks on your progress. Because it may take several weeks to see the full effects of this medicine, it is important to continue your treatment as prescribed by your doctor. Patients and their families should watch out for new or worsening thoughts of suicide or depression. Also watch out for sudden changes in feelings such as feeling anxious, agitated, panicky, irritable, hostile, aggressive, impulsive, severely restless, overly excited and hyperactive, or not being able to sleep. If this happens, especially at the beginning of treatment or after a change in dose, call your health care professional. You may get   drowsy or  dizzy. Do not drive, use machinery, or do anything that needs mental alertness until you know how this medicine affects you. Do not stand or sit up quickly, especially if you are an older patient. This reduces the risk of dizzy or fainting spells. Alcohol may interfere with the effect of this medicine. Avoid alcoholic drinks. Do not treat yourself for coughs, colds, or allergies without asking your doctor or health care professional for advice. Some ingredients can increase possible side effects. Your mouth may get dry. Chewing sugarless gum or sucking hard candy, and drinking plenty of water will help. Contact your doctor if the problem does not go away or is severe. This medicine may cause dry eyes and blurred vision. If you wear contact lenses you may feel some discomfort. Lubricating drops may help. See your eye doctor if the problem does not go away or is severe. This medicine can cause constipation. Try to have a bowel movement at least every 2 to 3 days. If you do not have a bowel movement for 3 days, call your doctor or health care professional. This medicine can make you more sensitive to the sun. Keep out of the sun. If you cannot avoid being in the sun, wear protective clothing and use sunscreen. Do not use sun lamps or tanning beds/booths. What side effects may I notice from receiving this medicine? Side effects that you should report to your doctor or health care professional as soon as possible: -allergic reactions like skin rash, itching or hives, swelling of the face, lips, or tongue -abnormal production of milk in females -breast enlargement in both males and females -breathing problems -confusion, hallucinations -fast, irregular heartbeat -fever with increased sweating -muscle stiffness, or spasms -pain or difficulty passing urine, loss of bladder control -seizures -suicidal thoughts or other mood changes -swelling of the testicles -tingling, pain, or numbness in the feet or  hands -yellowing of the eyes or skin Side effects that usually do not require medical attention (report to your doctor or health care professional if they continue or are bothersome): -change in sex drive or performance -constipation or diarrhea -nausea, vomiting -weight gain or loss This list may not describe all possible side effects. Call your doctor for medical advice about side effects. You may report side effects to FDA at 1-800-FDA-1088. Where should I keep my medicine? Keep out of the reach of children. Store at room temperature between 20 and 25 degrees C (68 and 77 degrees F). Throw away any unused medicine after the expiration date. NOTE: This sheet is a summary. It may not cover all possible information. If you have questions about this medicine, talk to your doctor, pharmacist, or health care provider.  2015, Elsevier/Gold Standard. (2012-01-12 13:50:32)  

## 2015-02-02 ENCOUNTER — Emergency Department (HOSPITAL_COMMUNITY)
Admission: EM | Admit: 2015-02-02 | Discharge: 2015-02-03 | Disposition: A | Discharge status: Home / Self Care | Payer: 59 | Attending: Emergency Medicine | Admitting: Emergency Medicine

## 2015-02-02 ENCOUNTER — Encounter (HOSPITAL_COMMUNITY): Payer: Self-pay | Admitting: Emergency Medicine

## 2015-02-02 DIAGNOSIS — E538 Deficiency of other specified B group vitamins: Secondary | ICD-10-CM | POA: Insufficient documentation

## 2015-02-02 DIAGNOSIS — Z8619 Personal history of other infectious and parasitic diseases: Secondary | ICD-10-CM | POA: Insufficient documentation

## 2015-02-02 DIAGNOSIS — X58XXXA Exposure to other specified factors, initial encounter: Secondary | ICD-10-CM | POA: Insufficient documentation

## 2015-02-02 DIAGNOSIS — J45901 Unspecified asthma with (acute) exacerbation: Secondary | ICD-10-CM | POA: Insufficient documentation

## 2015-02-02 DIAGNOSIS — T63441A Toxic effect of venom of bees, accidental (unintentional), initial encounter: Secondary | ICD-10-CM | POA: Insufficient documentation

## 2015-02-02 DIAGNOSIS — Z79899 Other long term (current) drug therapy: Secondary | ICD-10-CM | POA: Insufficient documentation

## 2015-02-02 DIAGNOSIS — Z7951 Long term (current) use of inhaled steroids: Secondary | ICD-10-CM | POA: Insufficient documentation

## 2015-02-02 DIAGNOSIS — F410 Panic disorder [episodic paroxysmal anxiety] without agoraphobia: Secondary | ICD-10-CM | POA: Insufficient documentation

## 2015-02-02 DIAGNOSIS — Y9289 Other specified places as the place of occurrence of the external cause: Secondary | ICD-10-CM | POA: Insufficient documentation

## 2015-02-02 DIAGNOSIS — D509 Iron deficiency anemia, unspecified: Secondary | ICD-10-CM | POA: Insufficient documentation

## 2015-02-02 DIAGNOSIS — E669 Obesity, unspecified: Secondary | ICD-10-CM | POA: Insufficient documentation

## 2015-02-02 DIAGNOSIS — K219 Gastro-esophageal reflux disease without esophagitis: Secondary | ICD-10-CM | POA: Insufficient documentation

## 2015-02-02 DIAGNOSIS — Y998 Other external cause status: Secondary | ICD-10-CM | POA: Insufficient documentation

## 2015-02-02 DIAGNOSIS — Y9389 Activity, other specified: Secondary | ICD-10-CM | POA: Insufficient documentation

## 2015-02-02 MED ORDER — PREDNISONE 20 MG PO TABS
60.0000 mg | ORAL_TABLET | Freq: Once | ORAL | Status: AC
  Administered 2015-02-02: 60 mg via ORAL
  Filled 2015-02-02: qty 3

## 2015-02-02 MED ORDER — FAMOTIDINE 20 MG PO TABS
20.0000 mg | ORAL_TABLET | Freq: Once | ORAL | Status: AC
  Administered 2015-02-02: 20 mg via ORAL
  Filled 2015-02-02: qty 1

## 2015-02-02 MED ORDER — SODIUM CHLORIDE 0.9 % IV BOLUS (SEPSIS)
1000.0000 mL | Freq: Once | INTRAVENOUS | Status: AC
  Administered 2015-02-02: 1000 mL via INTRAVENOUS

## 2015-02-02 MED ORDER — MORPHINE SULFATE 4 MG/ML IJ SOLN
4.0000 mg | Freq: Once | INTRAMUSCULAR | Status: DC
  Filled 2015-02-02: qty 1

## 2015-02-02 MED ORDER — DIPHENHYDRAMINE HCL 50 MG/ML IJ SOLN
25.0000 mg | Freq: Once | INTRAMUSCULAR | Status: AC
  Administered 2015-02-02: 25 mg via INTRAVENOUS
  Filled 2015-02-02: qty 1

## 2015-02-02 NOTE — ED Notes (Addendum)
Pt arrived to the ED with a complaint of a bee sting on the inside of her lip.  Pt states her daughter was playing in the grass when the incsects appeared and stung her on the inside bottom lip.  Pt denies allergy.  Pt is able to vocalize complaint.  Pt appears in no distress.   Pt is complaining of head and neck pain

## 2015-02-02 NOTE — ED Provider Notes (Signed)
CSN: [id number]     Arrival date & time 02/02/15  2124 History   First MD Initiated Contact with Patient 02/02/15 2219     Chief Complaint  Patient presents with  . Insect Bite     (Consider location/radiation/quality/duration/timing/severity/associated sxs/prior Treatment) HPI   34 year old female with no history of bee sting allergies presents for evaluation of her bee sting. Patient reports she was sitting outside with family drinking wine several hours ago when her daughter got stung by a bee. She went to pick up her daughter and felt a bee sting to the lower lip follows with severe sharp pain throughout left, mouth and neck. She then noticed swelling to her lip. She was hyperventilating initially and having trouble breathing but that since has improved. She does have history of asthma. At this time she has no significant chest pain, shortness of breath, abdominal cramping, nausea vomiting or diarrhea. No specific treatment tried.  Past Medical History  Diagnosis Date  . Asthma   . Anxiety   . GERD (gastroesophageal reflux disease)   . Allergic rhinitis   . Chronic constipation   . Anemia   . H. pylori infection 2008    Hx of tx + serology  . Panic attack   . Irritable bowel syndrome   . Obesity   . 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 Iron deficiency anemia 08/06/2011    Hgb 10.2 and MCV 79 on health screen labs    Past Surgical History  Procedure Laterality Date  . Nasal sinus surgery    . Flexible sigmoidoscopy  04/14/2008    normal  . Upper gastrointestinal endoscopy     Family History  Problem Relation Age of Onset  . Diabetes      grandmother  . Hypertension    . Colon cancer Neg Hx   . Hypertension Mother   . Hypertension Father   . Diabetes Maternal Aunt   . Diabetes Maternal Aunt   . Diabetes Paternal Grandmother   . Colon polyps Paternal Uncle     x4   History  Substance Use Topics  . Smoking status: Never  Smoker   . Smokeless tobacco: Never Used  . Alcohol Use: Yes     Comment: Social maybe twice amonth, beer   OB History    Gravida Para Term Preterm AB TAB SAB Ectopic Multiple Living   4 3 3  1  1   3      Review of Systems  All other systems reviewed and are negative.     Allergies  Augmentin; Cephalexin; and Iodine  Home Medications   Prior to Admission medications   Medication Sig Start Date End Date Taking? Authorizing Provider  budesonide-formoterol (SYMBICORT) 160-4.5 MCG/ACT inhaler Inhale 2 puffs into the lungs 2 (two) times daily. 11/13/14  Yes Aleksei Plotnikov V, MD  cholecalciferol (VITAMIN D) 1000 UNITS tablet Take 1,000 Units by mouth daily.   Yes Historical Provider, MD  Cyanocobalamin (VITAMIN B-12) 1000 MCG SUBL Place 1 tablet (1,000 mcg total) under the tongue daily. 06/21/14  Yes Aleksei Plotnikov V, MD  dexlansoprazole (DEXILANT) 60 MG capsule Take 60 mg by mouth daily.   Yes Historical Provider, MD  ferrous sulfate 325 (65 FE) MG tablet Take 1 tablet (325 mg total) by mouth daily with breakfast. 11/13/14  Yes Aleksei Plotnikov V, MD  montelukast (SINGULAIR) 10 MG tablet Take 1 tablet (10 mg total) by mouth daily. 11/13/14  Yes Aleksei Plotnikov V, MD  amitriptyline (ELAVIL) 10 MG tablet Take 1 tablet (10 mg total) by mouth at bedtime. 01/22/15   Carmen Dohmeier, MD   BP 118/89 mmHg  Pulse 101  Temp(Src) 98.7 F (37.1 C) (Oral)  Resp 20  Ht 5\' 3"  (1.6 m)  Wt 217 lb (98.431 kg)  BMI 38.45 kg/m2  SpO2 98%  LMP 01/25/2015 (Exact Date) Physical Exam  Constitutional: She is oriented to person, place, and time. She appears well-developed and well-nourished. No distress.  HENT:  Head: Atraumatic.  Lower lip is edematous. No evidence of retained stinger.  Mouth with no buccal  edema, no tongue edema and no airway compromise.  Eyes: Conjunctivae are normal.  Neck: Neck supple. No tracheal deviation present.  Pulmonary/Chest: She has wheezes (faint wheezes).   Abdominal: Soft. There is no tenderness.  Neurological: She is alert and oriented to person, place, and time.  Skin: No rash noted.  Psychiatric: She has a normal mood and affect.  Nursing note and vitals reviewed.   ED Course  Procedures (including critical care time)  Patient was stung by a bee to her lower lip. No airway compromise. Lower lip is edematous and tender. Will treat symptomatically.  12:00 AM Patient is currently receiving IV fluid, Benadryl, Pepcid, and prednisone. After monitoring for the past 2 hrs her lip edema has improved and pt felt better.  Ibuprofen given for pain.  Anticipate discharge.     12:44 AM Patient is stable for discharge. She will follow-up with PCP as needed. Return precautions discussed.  Labs Review Labs Reviewed - No data to display  Imaging Review No results found.   EKG Interpretation None      MDM   Final diagnoses:  Bee sting reaction, accidental or unintentional, initial encounter    BP 135/78 mmHg  Pulse 96  Temp(Src) 98.2 F (36.8 C) (Oral)  Resp 20  Ht 5\' 3"  (1.6 m)  Wt 217 lb (98.431 kg)  BMI 38.45 kg/m2  SpO2 100%  LMP 01/25/2015 (Exact Date)     Domenic Moras, PA-C 02/03/15 0044  Evelina Bucy, MD 02/03/15 [phone]

## 2015-02-03 MED ORDER — FAMOTIDINE 20 MG PO TABS: 20.0000 mg | ORAL_TABLET | Freq: Two times a day (BID) | ORAL | Status: DC

## 2015-02-03 MED ORDER — IBUPROFEN 800 MG PO TABS
800.0000 mg | ORAL_TABLET | Freq: Once | ORAL | Status: AC
  Administered 2015-02-03: 800 mg via ORAL
  Filled 2015-02-03: qty 1

## 2015-02-03 MED ORDER — DIPHENHYDRAMINE HCL 25 MG PO TABS: 25.0000 mg | ORAL_TABLET | Freq: Four times a day (QID) | ORAL | Status: DC

## 2015-02-03 MED ORDER — PREDNISONE 20 MG PO TABS: ORAL_TABLET | ORAL | Status: DC

## 2015-02-03 NOTE — ED Notes (Signed)
Pt alert, oriented, and ambulatory upon DC. She was advised to follow up with PCP if not better.

## 2015-02-03 NOTE — Discharge Instructions (Signed)

## 2015-02-21 ENCOUNTER — Ambulatory Visit (INDEPENDENT_AMBULATORY_CARE_PROVIDER_SITE_OTHER): Payer: 59 | Admitting: Family Medicine

## 2015-02-21 ENCOUNTER — Encounter: Payer: Self-pay | Admitting: Family Medicine

## 2015-02-21 VITALS — BP 116/82 | HR 79 | Ht 64.5 in | Wt 221.0 lb

## 2015-02-21 DIAGNOSIS — M9902 Segmental and somatic dysfunction of thoracic region: Secondary | ICD-10-CM | POA: Diagnosis not present

## 2015-02-21 DIAGNOSIS — M940 Chondrocostal junction syndrome [Tietze]: Secondary | ICD-10-CM | POA: Diagnosis not present

## 2015-02-21 DIAGNOSIS — M9908 Segmental and somatic dysfunction of rib cage: Secondary | ICD-10-CM

## 2015-02-21 DIAGNOSIS — M999 Biomechanical lesion, unspecified: Secondary | ICD-10-CM | POA: Insufficient documentation

## 2015-02-21 DIAGNOSIS — M9903 Segmental and somatic dysfunction of lumbar region: Secondary | ICD-10-CM

## 2015-02-21 DIAGNOSIS — G5702 Lesion of sciatic nerve, left lower limb: Secondary | ICD-10-CM | POA: Diagnosis not present

## 2015-02-21 NOTE — Progress Notes (Signed)
Pre visit review using our clinic review tool, if applicable. No additional management support is needed unless otherwise documented below in the visit note. 

## 2015-02-21 NOTE — Assessment & Plan Note (Signed)
Patient states that overall feels much better until manipular relation. We discussed icing regimen. Discussed different activities and what to avoid. We discussed proper lifting mechanics and how this can be beneficial. Patient will come back and see me again in 3-4 weeks

## 2015-02-21 NOTE — Patient Instructions (Signed)
Good to see you You are doing great overall New exercises 3 times a week On wall heels, butt shoulder and head touching for goal of 5 minutes Exercises on wall.  Heel and butt touching.  Raise leg 6 inches and hold 2 seconds.  Down slow for count of 4 seconds.  1 set of 30 reps daily on both sides.  For the butt pain tennis ball when sitting a long amount time See me again in 4 weeks.

## 2015-02-21 NOTE — Progress Notes (Signed)
[name] [place] [facility] [place], [place] [address] Phone: [phone] Subjective:    I'm seeing this patient by the request  of:  [name], MD   CC: Low back pain with sciatica  MEQ:[id number] Deanna Schwartz is a 34 y.o. female coming in with complaint of low back pain with sciatica. This is a new problem. Patient has had this problem for she states approximately 2 years. Seems to be getting worse over the course of time. Symptoms enter breath when she takes a deep breath. Denies any significant radiation down the leg but sometimes can have a numbness. Patient states sometimes it feels that her leg is weak but this is very intermittent. She states that it seems to be worse after sitting a long amount of time. States though that the rib pain on the left side and can unfortunately take her breath away as well. Does not know if this as well as her leg is can continue with certain not. Patient has tried some over-the-counter medications without any significant improvement. Patient is had this pain and even in April 2015 patient did have a CT scan that show no significant pathology. In addition of this patient had one in 2014 as well as did not show any significant bony abnormality's. States sometimes the pain is bad enough that it does wake her up at night.     Past medical history, social, surgical and family history all reviewed in electronic medical record.   Review of Systems: No headache, visual changes, nausea, vomiting, diarrhea, constipation, dizziness, abdominal pain, skin rash, fevers, chills, night sweats, weight loss, swollen lymph nodes, body aches, joint swelling, muscle aches, chest pain, shortness of breath, mood changes.   Objective Blood pressure 116/82, pulse 79, height 5' 4.5" (1.638 m), weight 221 lb (100.245 kg), last menstrual period 01/25/2015, SpO2 98 %.  General: No apparent distress alert and oriented x3 mood and affect normal,  dressed appropriately.  HEENT: Pupils equal, extraocular movements intact  Respiratory: Patient's speak in full sentences and does not appear short of breath  Cardiovascular: No lower extremity edema, non tender, no erythema  Skin: Warm dry intact with no signs of infection or rash on extremities or on axial skeleton.  Abdomen: Soft nontender  Neuro: Cranial nerves II through XII are intact, neurovascularly intact in all extremities with 2+ DTRs and 2+ pulses.  Lymph: No lymphadenopathy of posterior or anterior cervical chain or axillae bilaterally.  Gait normal with good balance and coordination.  MSK:  Non tender with full range of motion and good stability and symmetric strength and tone of shoulders, elbows, wrist, hip, knee and ankles bilaterally.  Back Exam:  Inspection: Unremarkable  Motion: Flexion 45 deg, Extension 45 deg, Side Bending to 45 deg bilaterally,  Rotation to 45 deg bilaterally  SLR laying: Negative  XSLR laying: Negative  Palpable tenderness: Pain over the piriformis muscle as well as the 11th rib on the left side. FABER: Positive left Sensory change: Gross sensation intact to all lumbar and sacral dermatomes.  Reflexes: 2+ at both patellar tendons, 2+ at achilles tendons, Babinski's downgoing.  Strength at foot  Plantar-flexion: 5/5 Dorsi-flexion: 5/5 Eversion: 5/5 Inversion: 5/5  Leg strength  Quad: 5/5 Hamstring: 5/5 Hip flexor: 5/5 Hip abductors: 3+/5 symmetric Gait unremarkable.  Osteopathic findings Cervical C2 flexed rotated and side bent right C4 flexed rotated and side bent left  Thoracic T3 extended rotated and side bent right T10 extended rotated and side bent  left with inhaled third rib  Lumbar L2 flexed rotated and side bent right  Procedure note 97110; 15 minutes spent for Therapeutic exercises as stated in above notes.  This included exercises focusing on stretching, strengthening, with significant focus on eccentric aspects.  Low back  exercises that included:  Pelvic tilt/bracing instruction to focus on control of the pelvic girdle and lower abdominal muscles  Glute strengthening exercises, focusing on proper firing of the glutes without engaging the low back muscles Proper stretching techniques for maximum relief for the hamstrings, hip flexors, low back and some rotation where tolerated Proper technique shown and discussed handout in great detail with ATC.  All questions were discussed and answered.     Impression and Recommendations:     This case required medical decision making of moderate complexity.

## 2015-02-21 NOTE — Assessment & Plan Note (Signed)
Decision today to treat with OMT was based on Physical Exam  After verbal consent patient was treated with HVLA, ME, FPR techniques in cervical, thoracic, lumbar and rib areas  Patient tolerated the procedure well with improvement in symptoms  Patient given exercises, stretches and lifestyle modifications  See medications in patient instructions if given  Patient will follow up in 3-4 weeks

## 2015-02-21 NOTE — Assessment & Plan Note (Signed)

## 2015-03-28 ENCOUNTER — Telehealth: Payer: Self-pay | Admitting: Internal Medicine

## 2015-03-28 ENCOUNTER — Telehealth: Payer: Self-pay

## 2015-03-28 DIAGNOSIS — E538 Deficiency of other specified B group vitamins: Secondary | ICD-10-CM

## 2015-03-28 NOTE — Telephone Encounter (Signed)
Patient is wanting to get her iron checked and is wondering if you can put lab order in.

## 2015-03-28 NOTE — Telephone Encounter (Signed)
Please advise, thanks.

## 2015-03-28 NOTE — Telephone Encounter (Signed)
Advised patient she can come to lab to have iron checked, orders are entered

## 2015-03-28 NOTE — Telephone Encounter (Signed)
Ok CBC, TIBC/iron Thx

## 2015-03-29 ENCOUNTER — Other Ambulatory Visit (INDEPENDENT_AMBULATORY_CARE_PROVIDER_SITE_OTHER): Payer: 59

## 2015-03-29 DIAGNOSIS — E538 Deficiency of other specified B group vitamins: Secondary | ICD-10-CM

## 2015-03-29 LAB — CBC WITH DIFFERENTIAL/PLATELET
Basophils Absolute: 0 10*3/uL (ref 0.0–0.1)
Basophils Relative: 0.6 % (ref 0.0–3.0)
Eosinophils Absolute: 0.9 10*3/uL — ABNORMAL HIGH (ref 0.0–0.7)
Eosinophils Relative: 11.9 % — ABNORMAL HIGH (ref 0.0–5.0)
HCT: 36 % (ref 36.0–46.0)
Hemoglobin: 11.8 g/dL — ABNORMAL LOW (ref 12.0–15.0)
LYMPHS ABS: 2.1 10*3/uL (ref 0.7–4.0)
LYMPHS PCT: 26.7 % (ref 12.0–46.0)
MCHC: 32.8 g/dL (ref 30.0–36.0)
MCV: 77.9 fl — AB (ref 78.0–100.0)
Monocytes Absolute: 0.6 10*3/uL (ref 0.1–1.0)
Monocytes Relative: 7.1 % (ref 3.0–12.0)
NEUTROS PCT: 53.7 % (ref 43.0–77.0)
Neutro Abs: 4.3 10*3/uL (ref 1.4–7.7)
Platelets: 266 10*3/uL (ref 150.0–400.0)
RBC: 4.62 Mil/uL (ref 3.87–5.11)
RDW: 16.3 % — ABNORMAL HIGH (ref 11.5–15.5)
WBC: 7.9 10*3/uL (ref 4.0–10.5)

## 2015-03-29 LAB — IRON AND TIBC
%SAT: 11 % — ABNORMAL LOW (ref 20–55)
IRON: 49 ug/dL (ref 42–145)
TIBC: 430 ug/dL (ref 250–470)
UIBC: 381 ug/dL (ref 125–400)

## 2015-03-31 MED ORDER — FERROUS SULFATE 325 (65 FE) MG PO TABS: 325.0000 mg | ORAL_TABLET | Freq: Two times a day (BID) | ORAL | Status: DC

## 2015-04-03 ENCOUNTER — Other Ambulatory Visit (INDEPENDENT_AMBULATORY_CARE_PROVIDER_SITE_OTHER): Payer: Self-pay | Admitting: Otolaryngology

## 2015-04-03 DIAGNOSIS — J329 Chronic sinusitis, unspecified: Secondary | ICD-10-CM

## 2015-04-10 ENCOUNTER — Other Ambulatory Visit: Payer: Self-pay | Admitting: Obstetrics and Gynecology

## 2015-04-10 DIAGNOSIS — N63 Unspecified lump in unspecified breast: Secondary | ICD-10-CM

## 2015-04-11 ENCOUNTER — Encounter: Payer: Self-pay | Admitting: Family Medicine

## 2015-04-13 ENCOUNTER — Ambulatory Visit
Admission: RE | Admit: 2015-04-13 | Discharge: 2015-04-13 | Disposition: A | Discharge status: Home / Self Care | Payer: 59 | Source: Ambulatory Visit | Attending: Otolaryngology | Admitting: Otolaryngology

## 2015-04-13 DIAGNOSIS — J329 Chronic sinusitis, unspecified: Secondary | ICD-10-CM

## 2015-04-18 ENCOUNTER — Ambulatory Visit (INDEPENDENT_AMBULATORY_CARE_PROVIDER_SITE_OTHER): Payer: 59 | Admitting: Family Medicine

## 2015-04-18 ENCOUNTER — Encounter: Payer: Self-pay | Admitting: Family Medicine

## 2015-04-18 ENCOUNTER — Ambulatory Visit
Admission: RE | Admit: 2015-04-18 | Discharge: 2015-04-18 | Disposition: A | Discharge status: Home / Self Care | Payer: No Typology Code available for payment source | Source: Ambulatory Visit | Attending: Obstetrics and Gynecology | Admitting: Obstetrics and Gynecology

## 2015-04-18 VITALS — BP 114/84 | HR 79 | Ht 64.5 in | Wt 221.0 lb

## 2015-04-18 DIAGNOSIS — M9902 Segmental and somatic dysfunction of thoracic region: Secondary | ICD-10-CM | POA: Diagnosis not present

## 2015-04-18 DIAGNOSIS — M940 Chondrocostal junction syndrome [Tietze]: Secondary | ICD-10-CM | POA: Diagnosis not present

## 2015-04-18 DIAGNOSIS — N63 Unspecified lump in unspecified breast: Secondary | ICD-10-CM

## 2015-04-18 DIAGNOSIS — M999 Biomechanical lesion, unspecified: Secondary | ICD-10-CM

## 2015-04-18 NOTE — Progress Notes (Signed)
  [facility] [place], [place] [address] Phone: [phone] Subjective:    I'm seeing this patient by the request  of:  [name], MD   CC: Left side pain  GYF:[id number] Deanna Schwartz is a 34 y.o. female coming in with complaint of left-sided pain. Patient has been seen previously was sinus with a slipped rib syndrome patient states that this is feels like the same syndrome again. States that this pain does seem to get worse with her menstruation. Patient is seen her OB/GYN next week. Patient states that the soreness is starting to affect some of her daily activities. Denies any radiation of pain or any type or rash. States though that can catch her breath.    .   Past medical history, social, surgical and family history all reviewed in electronic medical record.   Review of Systems: No headache, visual changes, nausea, vomiting, diarrhea, constipation, dizziness, abdominal pain, skin rash, fevers, chills, night sweats, weight loss, swollen lymph nodes, body aches, joint swelling, muscle aches, chest pain, shortness of breath, mood changes.   Objective Blood pressure 114/84, pulse 79, height 5' 4.5" (1.638 m), weight 221 lb (100.245 kg), SpO2 99 %.  General: No apparent distress alert and oriented x3 mood and affect normal, dressed appropriately.  HEENT: Pupils equal, extraocular movements intact  Respiratory: Patient's speak in full sentences and does not appear short of breath  Cardiovascular: No lower extremity edema, non tender, no erythema  Skin: Warm dry intact with no signs of infection or rash on extremities or on axial skeleton.  Abdomen: Soft nontender  Neuro: Cranial nerves II through XII are intact, neurovascularly intact in all extremities with 2+ DTRs and 2+ pulses.  Lymph: No lymphadenopathy of posterior or anterior cervical chain or axillae bilaterally.  Gait normal with good balance and coordination.  MSK:  Non  tender with full range of motion and good stability and symmetric strength and tone of shoulders, elbows, wrist, hip, knee and ankles bilaterally.  Back Exam:  Inspection: Unremarkable  Motion: Flexion 45 deg, Extension 45 deg, Side Bending to 45 deg bilaterally,  Rotation to 45 deg bilaterally  SLR laying: Negative  XSLR laying: Negative  Palpable tenderness: Only over the rib 10 on left FABER: Mildly positive left Sensory change: Gross sensation intact to all lumbar and sacral dermatomes.  Reflexes: 2+ at both patellar tendons, 2+ at achilles tendons, Babinski's downgoing.  Strength at foot  Plantar-flexion: 5/5 Dorsi-flexion: 5/5 Eversion: 5/5 Inversion: 5/5  Leg strength  Quad: 5/5 Hamstring: 5/5 Hip flexor: 5/5 Hip abductors: 3+/5 symmetric Gait unremarkable.  Osteopathic findings Cervical C2 flexed rotated and side bent right C4 flexed rotated and side bent left  Thoracic T3 extended rotated and side bent right T10 extended rotated and side bent left with inhaled rib  Lumbar L2 flexed rotated and side bent right    Impression and Recommendations:     This case required medical decision making of moderate complexity.

## 2015-04-18 NOTE — Progress Notes (Signed)
Pre visit review using our clinic review tool, if applicable. No additional management support is needed unless otherwise documented below in the visit note. 

## 2015-04-18 NOTE — Assessment & Plan Note (Signed)
I still think that this is likely the underlying cause. Patient does notice some discomfort at the same time of her menstruation selected could be some inflamed inflammatory aspect to this. Patient can take ibuprofen. Patient did respond very well to the manipulation and had not near complete resolution of pain. Discuss that we may need to do this on a more regular basis. Patient will work on scapular exercises. Patient come back and see me again in 3 weeks for further evaluation.

## 2015-04-18 NOTE — Patient Instructions (Signed)
Good to see you If the rib slips call me. Keep up the exercises Talk to your OB Continue the exercises as well See me again i n3 weeks if continues to have pain.

## 2015-04-18 NOTE — Assessment & Plan Note (Signed)
Decision today to treat with OMT was based on Physical Exam  After verbal consent patient was treated with HVLA, ME, FPR techniques in cervical, thoracic, lumbar and rib areas  Patient tolerated the procedure well with improvement in symptoms  Patient given exercises, stretches and lifestyle modifications  See medications in patient instructions if given  Patient will follow up in 3-4 weeks

## 2015-04-19 ENCOUNTER — Ambulatory Visit: Payer: 59 | Admitting: Internal Medicine

## 2015-04-19 ENCOUNTER — Ambulatory Visit (INDEPENDENT_AMBULATORY_CARE_PROVIDER_SITE_OTHER): Payer: 59

## 2015-04-19 DIAGNOSIS — Z23 Encounter for immunization: Secondary | ICD-10-CM

## 2015-04-23 ENCOUNTER — Encounter: Payer: Self-pay | Admitting: Internal Medicine

## 2015-04-24 ENCOUNTER — Other Ambulatory Visit: Payer: Self-pay | Admitting: Internal Medicine

## 2015-04-24 DIAGNOSIS — R21 Rash and other nonspecific skin eruption: Secondary | ICD-10-CM

## 2015-04-25 ENCOUNTER — Encounter: Payer: Self-pay | Admitting: Family Medicine

## 2015-05-30 ENCOUNTER — Ambulatory Visit (INDEPENDENT_AMBULATORY_CARE_PROVIDER_SITE_OTHER): Payer: 59 | Admitting: Family Medicine

## 2015-05-30 VITALS — BP 114/78 | HR 91 | Temp 98.5°F | Resp 16 | Ht 64.0 in | Wt 220.0 lb

## 2015-05-30 DIAGNOSIS — J45901 Unspecified asthma with (acute) exacerbation: Secondary | ICD-10-CM

## 2015-05-30 MED ORDER — IPRATROPIUM-ALBUTEROL 0.5-2.5 (3) MG/3ML IN SOLN: 3.0000 mL | Freq: Once | RESPIRATORY_TRACT | Status: DC

## 2015-05-30 MED ORDER — IPRATROPIUM BROMIDE 0.02 % IN SOLN
0.5000 mg | Freq: Once | RESPIRATORY_TRACT | Status: AC
  Administered 2015-05-30: 0.5 mg via RESPIRATORY_TRACT

## 2015-05-30 MED ORDER — FLUTICASONE-SALMETEROL 250-50 MCG/DOSE IN AEPB: 1.0000 | INHALATION_SPRAY | Freq: Two times a day (BID) | RESPIRATORY_TRACT | Status: DC

## 2015-05-30 MED ORDER — PREDNISONE 50 MG PO TABS: ORAL_TABLET | ORAL | Status: DC

## 2015-05-30 MED ORDER — ALBUTEROL SULFATE (2.5 MG/3ML) 0.083% IN NEBU
2.5000 mg | INHALATION_SOLUTION | Freq: Once | RESPIRATORY_TRACT | Status: AC
  Administered 2015-05-30: 2.5 mg via RESPIRATORY_TRACT

## 2015-05-30 NOTE — Progress Notes (Signed)
Subjective:    Deanna Schwartz is a 34 y.o. female seen in consultation for evaluation of asthma. Patient's symptoms include chest tightness, dyspnea, non-productive cough and wheezing. Associated symptoms include no fever, chills, sweats, pleuritic CP, long trips. The patient has been suffering from these symptoms for approximately 3 days. Symptoms have been gradually worsening since their onset. She has used Nyquil, Dayquil as well.  She was previously on Symbicort but this was too expensive.  She has had 5 previous attacks in the past year, has been hospitalized for this in 2005, but has never been intubated or in the ICU.  For current Sx, she has tried nebulizer tx earlier today x 2, most recent at 5 PM, with minimal relief.    The following portions of the patient's history were reviewed and updated as appropriate: allergies, current medications, past family history, past medical history, past social history, past surgical history and problem list.  Environmental History: unremarkable Review of Systems Pertinent items are noted in HPI.    Objective:    BP 114/78 mmHg  Pulse 91  Temp(Src) 98.5 F (36.9 C) (Oral)  Resp 16  Ht 5\' 4"  (1.626 m)  Wt 220 lb (99.791 kg)  BMI 37.74 kg/m2  SpO2 97%  LMP 05/16/2015 General appearance: alert, cooperative and appears stated age Nose: Nares normal. Septum midline. Mucosa normal. No drainage or sinus tenderness. Neck: no adenopathy and no accessory muscle use Lungs: Normal WOB, end expiratory wheezes throughout, slightly decreased air movement throughout Heart: regular rate and rhythm, S1, S2 normal, no murmur, click, rub or gallop  Laboratory:  none performed    Assessment:    Asthma, acute exacerbation, unknown persistence .   Plan:   - Duoneb tx and reevaluation w/ improvement in Sx.  Normal oxygenation as well as RR and improved air movement.  - Steroid 50 mg x 5 days  - No need for ABx today  - Recommend f/u with PCP for further  evaluation and management of persistent asthma with controller medication options (could not afford Symbicort in the past but multiple other options available).

## 2015-05-30 NOTE — Patient Instructions (Signed)

## 2015-06-22 ENCOUNTER — Encounter: Payer: Self-pay | Admitting: Internal Medicine

## 2015-06-22 ENCOUNTER — Ambulatory Visit (INDEPENDENT_AMBULATORY_CARE_PROVIDER_SITE_OTHER): Payer: 59 | Admitting: Internal Medicine

## 2015-06-22 ENCOUNTER — Other Ambulatory Visit (INDEPENDENT_AMBULATORY_CARE_PROVIDER_SITE_OTHER): Payer: 59

## 2015-06-22 VITALS — BP 124/76 | HR 73 | Temp 98.4°F | Resp 18 | Wt 221.0 lb

## 2015-06-22 DIAGNOSIS — D509 Iron deficiency anemia, unspecified: Secondary | ICD-10-CM

## 2015-06-22 DIAGNOSIS — R002 Palpitations: Secondary | ICD-10-CM

## 2015-06-22 DIAGNOSIS — R42 Dizziness and giddiness: Secondary | ICD-10-CM

## 2015-06-22 LAB — IRON: Iron: 49 ug/dL (ref 42–145)

## 2015-06-22 LAB — FERRITIN: Ferritin: 10.7 ng/mL (ref 10.0–291.0)

## 2015-06-22 LAB — CBC WITH DIFFERENTIAL/PLATELET
Basophils Absolute: 0.1 10*3/uL (ref 0.0–0.1)
Basophils Relative: 0.9 % (ref 0.0–3.0)
EOS PCT: 9.4 % — AB (ref 0.0–5.0)
Eosinophils Absolute: 0.7 10*3/uL (ref 0.0–0.7)
HEMATOCRIT: 35.3 % — AB (ref 36.0–46.0)
Hemoglobin: 11.5 g/dL — ABNORMAL LOW (ref 12.0–15.0)
LYMPHS ABS: 2.4 10*3/uL (ref 0.7–4.0)
Lymphocytes Relative: 31.7 % (ref 12.0–46.0)
MCHC: 32.5 g/dL (ref 30.0–36.0)
MCV: 80.1 fl (ref 78.0–100.0)
MONOS PCT: 7.2 % (ref 3.0–12.0)
Monocytes Absolute: 0.5 10*3/uL (ref 0.1–1.0)
NEUTROS ABS: 3.8 10*3/uL (ref 1.4–7.7)
NEUTROS PCT: 50.8 % (ref 43.0–77.0)
Platelets: 293 10*3/uL (ref 150.0–400.0)
RBC: 4.4 Mil/uL (ref 3.87–5.11)
RDW: 15 % (ref 11.5–15.5)
WBC: 7.5 10*3/uL (ref 4.0–10.5)

## 2015-06-22 LAB — COMPREHENSIVE METABOLIC PANEL
ALK PHOS: 65 U/L (ref 39–117)
ALT: 13 U/L (ref 0–35)
AST: 18 U/L (ref 0–37)
Albumin: 4 g/dL (ref 3.5–5.2)
BUN: 12 mg/dL (ref 6–23)
CALCIUM: 9.3 mg/dL (ref 8.4–10.5)
CO2: 29 mEq/L (ref 19–32)
Chloride: 103 mEq/L (ref 96–112)
Creatinine, Ser: 0.78 mg/dL (ref 0.40–1.20)
GFR: 109.15 mL/min (ref >60.00)
GLUCOSE: 86 mg/dL (ref 70–99)
POTASSIUM: 4.1 meq/L (ref 3.5–5.1)
Sodium: 138 mEq/L (ref 135–145)
TOTAL PROTEIN: 7.7 g/dL (ref 6.0–8.3)
Total Bilirubin: 0.3 mg/dL (ref 0.2–1.2)

## 2015-06-22 LAB — TSH: TSH: 1.29 u[IU]/mL (ref 0.35–4.50)

## 2015-06-22 NOTE — Progress Notes (Signed)
Pre visit review using our clinic review tool, if applicable. No additional management support is needed unless otherwise documented below in the visit note. 

## 2015-06-22 NOTE — Patient Instructions (Signed)
  We have reviewed your prior records including labs and tests today.  Test(s) ordered today. Your results will be released to Deanna Schwartz (or called to you) after review, usually within 72hours after test completion. If any changes need to be made, you will be notified at that same time.  Medications reviewed and updated.  No changes recommended at this time.  To prevent palpitations or premature beats, avoid stimulants such as decongestants, diet pills, nicotine, or caffeine (coffee, tea, cola, or chocolate) to excess.  Try to keep your stress level low.  Call if your symptoms change or worsen.

## 2015-06-22 NOTE — Progress Notes (Signed)
Subjective:    Patient ID: Deanna Schwartz, female    DOB: [birth year], 34 y.o.   MRN: [id number]  HPI For the past 2-3 weeks has had intermittent episodes of  jitteriness, sob, lightheaded/dizzy, her stomach feels empty and then she feels anxious because of these symptoms.  She does have palpitations with these episodes but is unsure if that is related to the anxiety or the symptoms.  She is unsure what symptoms start first.  She then feels drained.  She does not feel that anxiety is the cause of these symptoms.  There is no obvious pattern to when they occur, but often occur in the mid morning or night.  She was concerned about her bp or cholesterol.  The symptoms can last 1-2 hours.  Drinking water seems to help.  She sits down and rests.  It has been increasing in frequency, but it has not gotten more intense.    She has taken her BP when she was symptomatic her BP was 130/84, which is higher than normal for her.  One time it was 98/58.    There does not occur in any relation to food.  She only drinks coffee in the morning.  She denies soda.  She denies supplements.    The only thing different in her lifestyle is she is now in school.  She has three kids.  She does have anxiety but feels she is able to control it.   Medications and allergies reviewed with patient and updated if appropriate.  Patient Active Problem List   Diagnosis Date Noted  . 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 Nonallopathic lesion of thoracic region 02/21/2015  . Nonallopathic lesion of lumbosacral region 02/21/2015  . Nonallopathic lesion-rib cage 02/21/2015  . Piriformis syndrome of left side 02/21/2015  . Cervicalgia 01/22/2015  . Obesity (BMI 35.0-39.9 without comorbidity) ([place]) 01/22/2015  . Muscle tension headache 01/22/2015  . Iron deficiency anemia due to chronic blood loss - menses 01/03/2015  . Chronic chest wall pain - mostly left side 01/03/2015  . Well adult exam 11/13/2014  . B12 deficiency  11/13/2014  . Acromioclavicular joint separation, type 1 07/12/2014  . Trochanteric bursitis of left hip 06/20/2014  . Patellofemoral pain syndrome 04/07/2014  . Acute medial meniscal tear 01/25/2014  . Weight gain 11/18/2013  . Anemia, iron deficiency 11/18/2013  . Abdominal pain, left upper quadrant-musculoskeletal 05/13/2013  . Iron deficiency anemia 08/06/2011  . Arthralgia 03/24/2011  . INSOMNIA, CHRONIC 10/11/2010  . IBS 04/07/2008  . Palpitations 01/11/2008  . ANXIETY 11/25/2007  . Allergic rhinitis 11/25/2007  . GERD 11/25/2007  . SINUSITIS, CHRONIC 04/03/2007  . Asthma 04/03/2007    Past Medical History  Diagnosis Date  . Asthma   . Anxiety   . GERD (gastroesophageal reflux disease)   . Allergic rhinitis   . Chronic constipation   . Anemia   . H. pylori infection 2008    Hx of tx + serology  . Panic attack   . Irritable bowel syndrome   . Obesity   . 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 Iron deficiency anemia 08/06/2011    Hgb 10.2 and MCV 79 on health screen labs     Past Surgical History  Procedure Laterality Date  . Nasal sinus surgery    . Flexible sigmoidoscopy  04/14/2008    normal  . Upper gastrointestinal endoscopy      Social History   Social  History  . Marital Status: Married    Spouse Name: N/A  . Number of Children: 3  . Years of Education: N/A   Occupational History  . [employer]   Social History Main Topics  . Smoking status: Never Smoker   . Smokeless tobacco: Never Used  . Alcohol Use: Yes     Comment: Social maybe twice amonth, beer  . Drug Use: No  . Sexual Activity: Yes    Birth Control/ Protection: None   Other Topics Concern  . Not on file   Social History Narrative   HSG, [employer] in [place]. Occupation: [employer]. married  in Oct 2009. Son born in 2009, [place] dtrs - '07, '11. Marriage in good health.    Review of Systems    Constitutional: Negative for fever, chills and fatigue.  Respiratory: Positive for shortness of breath (with episodes). Negative for cough and wheezing.   Cardiovascular: Positive for chest pain (two episdoes with chest pain - the chest wall hurt when she touched it) and palpitations. Negative for leg swelling.  Gastrointestinal: Negative for nausea and abdominal pain.  Endocrine: Positive for heat intolerance (just at night, sweats a lot).  Neurological: Positive for dizziness and light-headedness (with episodes only). Negative for numbness and headaches.  Psychiatric/Behavioral: Negative for dysphoric mood. The patient is not nervous/anxious.        Objective:   Filed Vitals:   06/22/15 1618  BP: 124/76  Pulse: 73  Temp: 98.4 F (36.9 C)  Resp: 18   Filed Weights   06/22/15 1618  Weight: 221 lb (100.245 kg)   Body mass index is 37.92 kg/(m^2).   Physical Exam  Constitutional: She is oriented to person, place, and time. She appears well-developed and well-nourished. No distress.  HENT:  Head: Normocephalic and atraumatic.  Neck: Neck supple. No tracheal deviation present. No thyromegaly present.  Cardiovascular: Normal rate, regular rhythm and normal heart sounds.   No murmur heard. Pulmonary/Chest: Effort normal and breath sounds normal. No respiratory distress. She has no wheezes.  Musculoskeletal: She exhibits no edema.  Lymphadenopathy:    She has no cervical adenopathy.  Neurological: She is alert and oriented to person, place, and time.  Skin: Skin is warm and dry. No rash noted.  Psychiatric:  Slightly anxious        Assessment & Plan:   Episodes of lightheadedness, palpitations, sob, empty feeling in her stomach for 2-3 weeks Her BP today and when she is having symptoms is unremarkable and I do not think that is the cause No relation to food so hypoglycemia is unlikely Anxiety is a possibility but does not feel that is the cause We will go ahead and check  blood work - she is taking her iron pill daily , but has chronic iron def anemia I will also order a holter to rule out an arrhythmia  We will call her with the results and she will call if her symptoms worsen or do not improve  Discussed stress reduction

## 2015-06-24 ENCOUNTER — Encounter: Payer: Self-pay | Admitting: Internal Medicine

## 2015-07-02 NOTE — Progress Notes (Signed)
History and physical examinations reviewed/discussed with Dr. Awanda Mink. Agree with assessment and plan. Sabria Florido Elayne Guerin, M.D. [facility] [address] [place], [place]  [address] [phone] phone [phone] fax

## 2015-07-11 ENCOUNTER — Telehealth: Payer: Self-pay | Admitting: Emergency Medicine

## 2015-07-11 NOTE — Telephone Encounter (Signed)
Results have been mailed to pt

## 2015-07-11 NOTE — Telephone Encounter (Signed)
-----   Message from Binnie Rail, MD sent at 07/11/2015  8:51 AM EDT ----- I don't think he/she read their mychart message. Can you please call them or mail them a copy. Thanks.

## 2015-07-30 ENCOUNTER — Ambulatory Visit: Payer: 59 | Admitting: Family Medicine

## 2015-07-30 DIAGNOSIS — Z0289 Encounter for other administrative examinations: Secondary | ICD-10-CM

## 2015-08-15 ENCOUNTER — Ambulatory Visit: Payer: 59 | Admitting: Family Medicine

## 2015-08-17 ENCOUNTER — Encounter: Payer: Self-pay | Admitting: Family Medicine

## 2015-08-17 ENCOUNTER — Ambulatory Visit (INDEPENDENT_AMBULATORY_CARE_PROVIDER_SITE_OTHER): Payer: 59 | Admitting: Family Medicine

## 2015-08-17 VITALS — BP 114/70 | HR 104 | Ht 64.0 in | Wt 221.0 lb

## 2015-08-17 DIAGNOSIS — M222X9 Patellofemoral disorders, unspecified knee: Secondary | ICD-10-CM | POA: Diagnosis not present

## 2015-08-17 DIAGNOSIS — M9902 Segmental and somatic dysfunction of thoracic region: Secondary | ICD-10-CM | POA: Diagnosis not present

## 2015-08-17 DIAGNOSIS — M999 Biomechanical lesion, unspecified: Secondary | ICD-10-CM

## 2015-08-17 DIAGNOSIS — M940 Chondrocostal junction syndrome [Tietze]: Secondary | ICD-10-CM | POA: Diagnosis not present

## 2015-08-17 DIAGNOSIS — M9908 Segmental and somatic dysfunction of rib cage: Secondary | ICD-10-CM | POA: Diagnosis not present

## 2015-08-17 DIAGNOSIS — M9903 Segmental and somatic dysfunction of lumbar region: Secondary | ICD-10-CM

## 2015-08-17 MED ORDER — VITAMIN D (ERGOCALCIFEROL) 1.25 MG (50000 UNIT) PO CAPS: 50000.0000 [IU] | ORAL_CAPSULE | ORAL | Status: DC

## 2015-08-17 MED ORDER — MELOXICAM 15 MG PO TABS: 15.0000 mg | ORAL_TABLET | Freq: Every day | ORAL | Status: DC

## 2015-08-17 NOTE — Patient Instructions (Addendum)
Good to see you Happy holidays!  Stay active.  Meloxicam daily for 10 days then as needed Ice 20 minutes 2 times daily. Usually after activity and before bed. Vitamin D once weekly for the winter.  Try pennsaid pinkie amount topically 2 times daily as needed.  Wear brace daily for 1 week then with exercises for 4 weeks See me again in 4 weeks

## 2015-08-17 NOTE — Assessment & Plan Note (Signed)
Decision today to treat with OMT was based on Physical Exam  After verbal consent patient was treated with HVLA, ME, FPR techniques in cervical, thoracic, lumbar and rib areas  Patient tolerated the procedure well with improvement in symptoms  Patient given exercises, stretches and lifestyle modifications  See medications in patient instructions if given  Patient will follow up in 4 weeks

## 2015-08-17 NOTE — Assessment & Plan Note (Signed)
Worsen again at this time. We discussed icing regimen and home exercises. We discussed topical anti-inflammatories and patient given a prescription for oral anti-inflammatories. Has had difficulty with vitamin D and given once weekly. We also discussed formal physical therapy and patient has been referred today. Patient will do more of a bracing. Patient come back and see me again in 4 weeks for further evaluation and treatment. Continuing have pain we'll consider injection.

## 2015-08-17 NOTE — Progress Notes (Signed)
Pre visit review using our clinic review tool, if applicable. No additional management support is needed unless otherwise documented below in the visit note. 

## 2015-08-17 NOTE — Assessment & Plan Note (Signed)
Encouraged home exercises focusing on scapular stabilization.  Discussed icing, posture and ergonomics at work.  RTC in 4 weeks.

## 2015-08-17 NOTE — Progress Notes (Signed)
[name] [name],  [address] Phone: [phone] Subjective:    I'm seeing this patient by the request  of:  [name], MD   CC: Knee pain and back pain.   [id number] Deanna Schwartz is a 34 y.o. female coming in with complaint of left-sided pain. Seen for this for quite some time ago. Patient did respond well to osteopathic manipulation. Feels that the rib has possibly slipped again. Having some discomfort. Still can catch her breath from time to time. No fever, chills, or any abnormal weight loss. Was doing a lot of overhead activity. Does not seem associated with food.  Patient also complaining of patellofemoral syndrome. Patient has had this in last time it flared was approximately 1 year ago. Patient did go to formal physical therapy and responded well. Has stopped all vitamins, home exercises, as well as bracing. Having very similar presentation again. Denies any significant instability. Denies any giving out on her but sometimes has a catching sensation. Rates the severity of 6 out of 10 .   Past medical history, social, surgical and family history all reviewed in electronic medical record.   Review of Systems: No headache, visual changes, nausea, vomiting, diarrhea, constipation, dizziness, abdominal pain, skin rash, fevers, chills, night sweats, weight loss, swollen lymph nodes, body aches, joint swelling, muscle aches, chest pain, shortness of breath, mood changes.   Objective Blood pressure 114/70, pulse 104, height 5\' 4"  (1.626 m), weight 221 lb (100.245 kg), SpO2 99 %.  General: No apparent distress alert and oriented x3 mood and affect normal, dressed appropriately.  HEENT: Pupils equal, extraocular movements intact  Respiratory: Patient's speak in full sentences and does not appear short of breath  Cardiovascular: No lower extremity edema, non tender, no erythema  Skin: Warm dry intact with no signs of infection  or rash on extremities or on axial skeleton.  Abdomen: Soft nontender  Neuro: Cranial nerves II through XII are intact, neurovascularly intact in all extremities with 2+ DTRs and 2+ pulses.  Lymph: No lymphadenopathy of posterior or anterior cervical chain or axillae bilaterally.  Gait normal with good balance and coordination.  MSK:  Non tender with full range of motion and good stability and symmetric strength and tone of shoulders, elbows, wrist, hip, and ankles bilaterally.  Back Exam:  Inspection: Unremarkable  Motion: Flexion 45 deg, Extension 25 deg, Side Bending to 45 deg bilaterally,  Rotation to 45 deg bilaterally  SLR laying: Negative  XSLR laying: Negative  Palpable tenderness: Tender over paraspinal musculature on the right side of the thoracic spine FABER: positive left Sensory change: Gross sensation intact to all lumbar and sacral dermatomes.  Reflexes: 2+ at both patellar tendons, 2+ at achilles tendons, Babinski's downgoing.  Strength at foot  Plantar-flexion: 5/5 Dorsi-flexion: 5/5 Eversion: 5/5 Inversion: 5/5  Leg strength  Quad: 5/5 Hamstring: 5/5 Hip flexor: 5/5 Hip abductors: 3+/5 symmetric Gait unremarkable.  Knee: Right Normal to inspection with no erythema or effusion or obvious bony abnormalities. Tender over the medial joint line and somewhat over the superior lateral patella ROM full in flexion and extension and lower leg rotation. Ligaments with solid consistent endpoints including ACL, PCL, LCL, MCL. Negative Mcmurray's, Apley's, and Thessalonian tests. mild painful patellar compression. Patellar glide with crepitus Patellar and quadriceps tendons unremarkable. Hamstring and quadriceps strength is normal.    Osteopathic findings Cervical C2 flexed rotated and side bent right C4 flexed rotated and side bent left  Thoracic T3  extended rotated and side bent right T10 extended rotated and side bent left   Lumbar L2 flexed rotated and side bent  right  Sacrum left on left    Impression and Recommendations:     This case required medical decision making of moderate complexity.

## 2015-08-21 ENCOUNTER — Encounter: Payer: Self-pay | Admitting: Internal Medicine

## 2015-08-27 ENCOUNTER — Ambulatory Visit (INDEPENDENT_AMBULATORY_CARE_PROVIDER_SITE_OTHER): Payer: 59 | Admitting: Internal Medicine

## 2015-08-27 ENCOUNTER — Other Ambulatory Visit (INDEPENDENT_AMBULATORY_CARE_PROVIDER_SITE_OTHER): Payer: 59

## 2015-08-27 ENCOUNTER — Ambulatory Visit (INDEPENDENT_AMBULATORY_CARE_PROVIDER_SITE_OTHER)
Admission: RE | Admit: 2015-08-27 | Discharge: 2015-08-27 | Disposition: A | Discharge status: Home / Self Care | Payer: 59 | Source: Ambulatory Visit | Attending: Internal Medicine | Admitting: Internal Medicine

## 2015-08-27 ENCOUNTER — Encounter: Payer: Self-pay | Admitting: Internal Medicine

## 2015-08-27 VITALS — BP 100/60 | HR 92 | Temp 98.3°F | Wt 224.0 lb

## 2015-08-27 DIAGNOSIS — F411 Generalized anxiety disorder: Secondary | ICD-10-CM | POA: Diagnosis not present

## 2015-08-27 DIAGNOSIS — M940 Chondrocostal junction syndrome [Tietze]: Secondary | ICD-10-CM

## 2015-08-27 DIAGNOSIS — E538 Deficiency of other specified B group vitamins: Secondary | ICD-10-CM

## 2015-08-27 DIAGNOSIS — R1012 Left upper quadrant pain: Secondary | ICD-10-CM

## 2015-08-27 LAB — URINALYSIS, ROUTINE W REFLEX MICROSCOPIC
BILIRUBIN URINE: NEGATIVE
Ketones, ur: NEGATIVE
Leukocytes, UA: NEGATIVE
Nitrite: NEGATIVE
PH: 5.5 (ref 5.0–8.0)
SPECIFIC GRAVITY, URINE: 1.025 (ref 1.000–1.030)
Total Protein, Urine: NEGATIVE
UROBILINOGEN UA: 0.2 (ref 0.0–1.0)
Urine Glucose: NEGATIVE
WBC, UA: NONE SEEN (ref 0–?)

## 2015-08-27 LAB — CBC WITH DIFFERENTIAL/PLATELET
BASOS PCT: 0.5 % (ref 0.0–3.0)
Basophils Absolute: 0 10*3/uL (ref 0.0–0.1)
EOS ABS: 0.7 10*3/uL (ref 0.0–0.7)
Eosinophils Relative: 8.9 % — ABNORMAL HIGH (ref 0.0–5.0)
HCT: 34.4 % — ABNORMAL LOW (ref 36.0–46.0)
Hemoglobin: 11 g/dL — ABNORMAL LOW (ref 12.0–15.0)
LYMPHS ABS: 1.9 10*3/uL (ref 0.7–4.0)
Lymphocytes Relative: 24.7 % (ref 12.0–46.0)
MCHC: 32.1 g/dL (ref 30.0–36.0)
MCV: 80 fl (ref 78.0–100.0)
MONO ABS: 0.5 10*3/uL (ref 0.1–1.0)
Monocytes Relative: 6.2 % (ref 3.0–12.0)
NEUTROS ABS: 4.7 10*3/uL (ref 1.4–7.7)
NEUTROS PCT: 59.7 % (ref 43.0–77.0)
PLATELETS: 260 10*3/uL (ref 150.0–400.0)
RBC: 4.3 Mil/uL (ref 3.87–5.11)
RDW: 15.4 % (ref 11.5–15.5)
WBC: 7.9 10*3/uL (ref 4.0–10.5)

## 2015-08-27 LAB — IBC PANEL
IRON: 53 ug/dL (ref 42–145)
Saturation Ratios: 11.4 % — ABNORMAL LOW (ref 20.0–50.0)
Transferrin: 332 mg/dL (ref 212.0–360.0)

## 2015-08-27 LAB — VITAMIN D 25 HYDROXY (VIT D DEFICIENCY, FRACTURES): VITD: 22.22 ng/mL — ABNORMAL LOW (ref 30.00–100.00)

## 2015-08-27 LAB — BASIC METABOLIC PANEL
BUN: 14 mg/dL (ref 6–23)
CO2: 27 mEq/L (ref 19–32)
CREATININE: 0.77 mg/dL (ref 0.40–1.20)
Calcium: 9.3 mg/dL (ref 8.4–10.5)
Chloride: 103 mEq/L (ref 96–112)
GFR: 110.67 mL/min (ref >60.00)
Glucose, Bld: 84 mg/dL (ref 70–99)
Potassium: 4.4 mEq/L (ref 3.5–5.1)
Sodium: 138 mEq/L (ref 135–145)

## 2015-08-27 LAB — TSH: TSH: 2.04 u[IU]/mL (ref 0.35–4.50)

## 2015-08-27 LAB — VITAMIN B12: VITAMIN B 12: 155 pg/mL — AB (ref 211–911)

## 2015-08-27 MED ORDER — VITAMIN D (ERGOCALCIFEROL) 1.25 MG (50000 UNIT) PO CAPS: 50000.0000 [IU] | ORAL_CAPSULE | ORAL | Status: DC

## 2015-08-27 MED ORDER — CYANOCOBALAMIN 1000 MCG/ML IJ SOLN: 1000.0000 ug | INTRAMUSCULAR | Status: DC

## 2015-08-27 NOTE — Progress Notes (Signed)
Pre visit review using our clinic review tool, if applicable. No additional management support is needed unless otherwise documented below in the visit note. 

## 2015-08-27 NOTE — Telephone Encounter (Signed)
Erroneous

## 2015-08-27 NOTE — Assessment & Plan Note (Signed)
Dr Tamala Julian Starting PT

## 2015-08-27 NOTE — Assessment & Plan Note (Signed)
x3 years Labs LS X ray

## 2015-08-27 NOTE — Progress Notes (Signed)
Subjective:  Patient ID: Deanna Schwartz, female    DOB: 1981-08-02  Age: 34 y.o. MRN: [id number]  CC: No chief complaint on file.   HPI [facility] presents for L sided LBP and pain under L shoulder blade x 3 years - worse now, constant and burning 5/10 now - can go up to 7/10. Heating pad helps...  Stress w/3 kids, working part time  Outpatient Prescriptions Prior to Visit  Medication Sig Dispense Refill  . albuterol (PROVENTIL HFA;VENTOLIN HFA) 108 (90 BASE) MCG/ACT inhaler Inhale into the lungs every 6 (six) hours as needed for wheezing or shortness of breath.    . cholecalciferol (VITAMIN D) 1000 UNITS tablet Take 1,000 Units by mouth daily.    . Cyanocobalamin (VITAMIN B-12) 1000 MCG SUBL Place 1 tablet (1,000 mcg total) under the tongue daily. 100 tablet 3  . dexlansoprazole (DEXILANT) 60 MG capsule Take 60 mg by mouth daily.    . ferrous sulfate 325 (65 FE) MG tablet Take 1 tablet (325 mg total) by mouth 2 (two) times daily with a meal. 100 tablet 1  . Fluticasone-Salmeterol (ADVAIR DISKUS) 250-50 MCG/DOSE AEPB Inhale 1 puff into the lungs 2 (two) times daily. 1 each 3  . meloxicam (MOBIC) 15 MG tablet Take 1 tablet (15 mg total) by mouth daily. 30 tablet 0  . Vitamin D, Ergocalciferol, (DRISDOL) 50000 UNITS CAPS capsule Take 1 capsule (50,000 Units total) by mouth every 7 (seven) days. 12 capsule 0   No facility-administered medications prior to visit.    ROS Review of Systems  Constitutional: Negative for chills, activity change, appetite change, fatigue and unexpected weight change.  HENT: Negative for congestion, mouth sores and sinus pressure.   Eyes: Negative for visual disturbance.  Respiratory: Negative for cough and chest tightness.   Gastrointestinal: Negative for nausea and abdominal pain.  Genitourinary: Negative for frequency, difficulty urinating, vaginal pain and pelvic pain.  Musculoskeletal: Negative for back pain, gait problem and neck stiffness.    Skin: Negative for pallor and rash.  Neurological: Negative for dizziness, tremors, weakness, numbness and headaches.  Psychiatric/Behavioral: Negative for confusion and sleep disturbance.    Objective:  BP 100/60 mmHg  Pulse 92  Temp(Src) 98.3 F (36.8 C) (Oral)  Wt 224 lb (101.606 kg)  SpO2 95%  BP Readings from Last 3 Encounters:  08/27/15 100/60  08/17/15 114/70  06/22/15 124/76    Wt Readings from Last 3 Encounters:  08/27/15 224 lb (101.606 kg)  08/17/15 221 lb (100.245 kg)  06/22/15 221 lb (100.245 kg)    Physical Exam  Constitutional: She appears well-developed. No distress.  HENT:  Head: Normocephalic.  Right Ear: External ear normal.  Left Ear: External ear normal.  Nose: Nose normal.  Mouth/Throat: Oropharynx is clear and moist.  Eyes: Conjunctivae are normal. Pupils are equal, round, and reactive to light. Right eye exhibits no discharge. Left eye exhibits no discharge.  Neck: Normal range of motion. Neck supple. No JVD present. No tracheal deviation present. No thyromegaly present.  Cardiovascular: Normal rate, regular rhythm and normal heart sounds.   Pulmonary/Chest: No stridor. No respiratory distress. She has no wheezes.  Abdominal: Soft. Bowel sounds are normal. She exhibits no distension and no mass. There is no tenderness. There is no rebound and no guarding.  Musculoskeletal: She exhibits tenderness. She exhibits no edema.  Lymphadenopathy:    She has no cervical adenopathy.  Neurological: She displays normal reflexes. No cranial nerve deficit. She exhibits normal muscle tone. Coordination normal.  Skin: No rash noted. No erythema.  Psychiatric: She has a normal mood and affect. Her behavior is normal. Judgment and thought content normal.  Tender L lower ribs, LS spine, traps Obese  Lab Results  Component Value Date   WBC 7.5 06/22/2015   HGB 11.5* 06/22/2015   HCT 35.3* 06/22/2015   PLT 293.0 06/22/2015   GLUCOSE 86 06/22/2015   CHOL 180  11/09/2014   TRIG 154.0* 11/09/2014   HDL 52.30 11/09/2014   LDLCALC 97 11/09/2014   ALT 13 06/22/2015   AST 18 06/22/2015   NA 138 06/22/2015   K 4.1 06/22/2015   CL 103 06/22/2015   CREATININE 0.78 06/22/2015   BUN 12 06/22/2015   CO2 29 06/22/2015   TSH 1.29 06/22/2015   HGBA1C 5.2 04/21/2012    US Breast Ltd Uni Left Inc Axilla  04/18/2015  CLINICAL DATA:  34 year old female for follow-up of left breast mass. EXAM: ULTRASOUND OF THE LEFT BREAST COMPARISON:  09/14/2014 ultrasound FINDINGS: On physical exam, no palpable abnormalities are identified in the retroareolar left breast. Targeted ultrasound is performed, showing a stable 4 x 3 x 4 mm complicated cyst with layering milk of calcium at the 1 o'clock position of the retroareolar left breast. IMPRESSION: Stable benign complicated cyst in the retroareolar left breast. RECOMMENDATION: Bilateral screening mammograms at age 24. I have discussed the findings and recommendations with the patient. Results were also provided in writing at the conclusion of the visit. If applicable, a reminder letter will be sent to the patient regarding the next appointment. BI-RADS CATEGORY  2: Benign. Electronically Signed   By: Margarette Canada M.D.   On: 04/18/2015 11:32    Assessment & Plan:   Diagnoses and all orders for this visit:  Abdominal pain, left upper quadrant-musculoskeletal -     DG Lumbar Spine 2-3 Views -     VITAMIN D 25 Hydroxy (Vit-D Deficiency, Fractures); Future -     Vitamin B12; Future -     TSH; Future -     Basic metabolic panel; Future -     IBC panel; Future -     CBC with Differential/Platelet; Future -     Urinalysis; Future  B12 deficiency -     VITAMIN D 25 Hydroxy (Vit-D Deficiency, Fractures); Future -     Vitamin B12; Future -     TSH; Future -     Basic metabolic panel; Future -     IBC panel; Future -     CBC with Differential/Platelet; Future -     Urinalysis; Future  Slipped rib syndrome -     DG Lumbar  Spine 2-3 Views -     VITAMIN D 25 Hydroxy (Vit-D Deficiency, Fractures); Future -     Vitamin B12; Future -     TSH; Future -     Basic metabolic panel; Future -     IBC panel; Future -     CBC with Differential/Platelet; Future -     Urinalysis; Future  Generalized anxiety disorder -     VITAMIN D 25 Hydroxy (Vit-D Deficiency, Fractures); Future -     Vitamin B12; Future -     TSH; Future -     Basic metabolic panel; Future -     IBC panel; Future -     CBC with Differential/Platelet; Future -     Urinalysis; Future  I am having Ms. Esterline maintain her Vitamin B-12, cholecalciferol, dexlansoprazole, ferrous sulfate, albuterol, Fluticasone-Salmeterol,  Vitamin D (Ergocalciferol), and meloxicam.  No orders of the defined types were placed in this encounter.     Follow-up: No Follow-up on file.  [name], MD

## 2015-08-27 NOTE — Assessment & Plan Note (Signed)
On B12 

## 2015-08-29 ENCOUNTER — Telehealth: Payer: Self-pay

## 2015-08-29 ENCOUNTER — Ambulatory Visit: Payer: 59

## 2015-08-29 ENCOUNTER — Ambulatory Visit (INDEPENDENT_AMBULATORY_CARE_PROVIDER_SITE_OTHER): Payer: 59

## 2015-08-29 DIAGNOSIS — E538 Deficiency of other specified B group vitamins: Secondary | ICD-10-CM | POA: Diagnosis not present

## 2015-08-29 MED ORDER — CYANOCOBALAMIN 1000 MCG/ML IJ SOLN
1000.0000 ug | Freq: Once | INTRAMUSCULAR | Status: AC
  Administered 2015-08-29: 1000 ug via INTRAMUSCULAR

## 2015-08-29 NOTE — Telephone Encounter (Signed)
Per dr Alain Marion, patient is to have weekly b12 injections for the next 4 weeks, and then go bi-weekly for 3 months---labwork to be drawn again around 3/25 or 3/26---i have advised patient, she has made all nurse visit appts through 3/22, and i have ordered b12 lab to be drawn appx 3/25----patient advised and repeated back for understanding

## 2015-09-05 ENCOUNTER — Ambulatory Visit: Payer: 59

## 2015-09-06 ENCOUNTER — Ambulatory Visit (INDEPENDENT_AMBULATORY_CARE_PROVIDER_SITE_OTHER): Payer: 59

## 2015-09-06 DIAGNOSIS — E538 Deficiency of other specified B group vitamins: Secondary | ICD-10-CM

## 2015-09-06 MED ORDER — CYANOCOBALAMIN 1000 MCG/ML IJ SOLN
1000.0000 ug | Freq: Once | INTRAMUSCULAR | Status: AC
  Administered 2015-09-06: 1000 ug via INTRAMUSCULAR

## 2015-09-12 ENCOUNTER — Ambulatory Visit: Payer: 59

## 2015-09-13 ENCOUNTER — Ambulatory Visit (INDEPENDENT_AMBULATORY_CARE_PROVIDER_SITE_OTHER): Payer: Self-pay

## 2015-09-13 DIAGNOSIS — E538 Deficiency of other specified B group vitamins: Secondary | ICD-10-CM

## 2015-09-13 MED ORDER — CYANOCOBALAMIN 1000 MCG/ML IJ SOLN
1000.0000 ug | Freq: Once | INTRAMUSCULAR | Status: AC
  Administered 2015-09-13: 1000 ug via INTRAMUSCULAR

## 2015-09-19 ENCOUNTER — Ambulatory Visit: Payer: 59

## 2015-09-20 ENCOUNTER — Ambulatory Visit (INDEPENDENT_AMBULATORY_CARE_PROVIDER_SITE_OTHER): Payer: BLUE CROSS/BLUE SHIELD

## 2015-09-20 DIAGNOSIS — E538 Deficiency of other specified B group vitamins: Secondary | ICD-10-CM

## 2015-09-20 MED ORDER — CYANOCOBALAMIN 1000 MCG/ML IJ SOLN
1000.0000 ug | Freq: Once | INTRAMUSCULAR | Status: AC
  Administered 2015-09-20: 1000 ug via INTRAMUSCULAR

## 2015-10-03 ENCOUNTER — Ambulatory Visit: Payer: 59

## 2015-10-10 ENCOUNTER — Ambulatory Visit (INDEPENDENT_AMBULATORY_CARE_PROVIDER_SITE_OTHER): Payer: BLUE CROSS/BLUE SHIELD

## 2015-10-10 ENCOUNTER — Ambulatory Visit (INDEPENDENT_AMBULATORY_CARE_PROVIDER_SITE_OTHER): Payer: BLUE CROSS/BLUE SHIELD | Admitting: Physician Assistant

## 2015-10-10 VITALS — BP 128/80 | HR 103 | Temp 98.6°F | Resp 18 | Ht 64.0 in | Wt 225.0 lb

## 2015-10-10 DIAGNOSIS — R062 Wheezing: Secondary | ICD-10-CM

## 2015-10-10 DIAGNOSIS — R079 Chest pain, unspecified: Secondary | ICD-10-CM

## 2015-10-10 DIAGNOSIS — J4521 Mild intermittent asthma with (acute) exacerbation: Secondary | ICD-10-CM

## 2015-10-10 LAB — POCT GLYCOSYLATED HEMOGLOBIN (HGB A1C): HEMOGLOBIN A1C: 5.5

## 2015-10-10 MED ORDER — PREDNISONE 20 MG PO TABS: ORAL_TABLET | ORAL | Status: AC

## 2015-10-10 MED ORDER — ALBUTEROL SULFATE (2.5 MG/3ML) 0.083% IN NEBU
2.5000 mg | INHALATION_SOLUTION | Freq: Once | RESPIRATORY_TRACT | Status: AC
  Administered 2015-10-10: 2.5 mg via RESPIRATORY_TRACT

## 2015-10-10 MED ORDER — IPRATROPIUM BROMIDE 0.02 % IN SOLN
0.5000 mg | Freq: Once | RESPIRATORY_TRACT | Status: AC
  Administered 2015-10-10: 0.5 mg via RESPIRATORY_TRACT

## 2015-10-10 NOTE — Patient Instructions (Signed)
Because you received an x-ray today, you will receive an invoice from [facility]. Please contact [facility] at [phone] with questions or concerns regarding your invoice. Our billing staff will not be able to assist you with those questions. °

## 2015-10-10 NOTE — Progress Notes (Signed)
10/11/2015 7:00 PM   DOB: 10/29/1980 / MRN: [id number]  SUBJECTIVE:  Deanna Schwartz is a 35 y.o. female presenting for chest pain that started at 4:30 today. She describes the pain as tightness.  Associates SOB and feeling dizzy.  Denies nausea and radiation of the pain.  Denies a family history of CAD, a personal history of HTN, diabetes, and smoking.  She has a history of panic and reports these symptoms are different from that.  Also has a history of asthma and states her symptoms are similar to this.  Has tried albuterol without relief.    She is allergic to augmentin; cephalexin; and iodine.   She  has a past medical history of Asthma; Anxiety; GERD (gastroesophageal reflux disease); Allergic rhinitis; Chronic constipation; Anemia; H. 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    She  reports that she has never smoked. She has never used smokeless tobacco. She reports that she drinks alcohol. She reports that she does not use illicit drugs. She  reports that she currently engages in sexual activity. She reports using the following method of birth control/protection: None. The patient  has past surgical history that includes Nasal sinus surgery; Flexible sigmoidoscopy (04/14/2008); and Upper gastrointestinal endoscopy.  Her family history includes Colon polyps in her paternal uncle; Diabetes in her maternal aunt, maternal aunt, and paternal grandmother; Hypertension in her father and mother. There is no history of Colon cancer.  Review of Systems  Constitutional: Negative for fever and diaphoresis.  Respiratory: Positive for cough, shortness of breath and wheezing. Negative for sputum production.   Cardiovascular: Positive for chest pain.  Gastrointestinal: Negative for nausea.  Genitourinary: Negative for dysuria.  Musculoskeletal: Negative for myalgias.    Skin: Negative for rash.  Neurological: Negative for dizziness and headaches.     Problem list and medications reviewed and updated by myself where necessary, and exist elsewhere in the encounter.   OBJECTIVE:  BP 128/80 mmHg  Pulse 103  Temp(Src) 98.6 F (37 C) (Oral)  Resp 18  Ht 5\' 4"  (1.626 m)  Wt 225 lb (102.059 kg)  BMI 38.60 kg/m2  SpO2 98%  Physical Exam  Constitutional: She is oriented to person, place, and time. She appears well-developed and well-nourished. No distress.  Pulmonary/Chest: She has wheezes (diffuse, inspiratory and expiratory).  Abdominal: Soft.  Musculoskeletal: Normal range of motion.  Neurological: She is alert and oriented to person, place, and time.  Skin: Skin is warm and dry. She is not diaphoretic.  Psychiatric: Her mood appears anxious. Her speech is not rapid and/or pressured. She is agitated and hyperactive.    Results for orders placed or performed in visit on 10/10/15 (from the past 72 hour(s))  POCT glycosylated hemoglobin (Hb A1C)     Status: None   Collection Time: 10/10/15  6:49 PM  Result Value Ref Range   Hemoglobin A1C 5.5     Dg Chest 2 View  10/10/2015  CLINICAL DATA:  Wheezing and chest pain EXAM: CHEST  2 VIEW COMPARISON:  10/02/2014 chest radiograph. FINDINGS: Stable cardiomediastinal silhouette with normal heart size. No pneumothorax. No pleural effusion. Lungs appear clear, with no acute consolidative airspace disease and no pulmonary edema. IMPRESSION: No active cardiopulmonary disease. Electronically Signed   By: Ilona Sorrel M.D.   On: 10/10/2015 18:17     ASSESSMENT AND PLAN  Deanna Schwartz was seen today for chest pain and anxiety.  Diagnoses and all orders for this visit:  Chest pain, unspecified: Patient with resolved chest pain s/p nebs times 2.  Wheezing has largely subsided.  Will treat for problem three. She was provided with 0.25 Xanax here and this did help to reduce her agitation.   RTC as needed.  -     EKG  12-Lead -     albuterol (PROVENTIL) (2.5 MG/3ML) 0.083% nebulizer solution 2.5 mg; Take 3 mLs (2.5 mg total) by nebulization once. -     ipratropium (ATROVENT) nebulizer solution 0.5 mg; Take 2.5 mLs (0.5 mg total) by nebulization once.  Wheezing -     DG Chest 2 View; Future -     albuterol (PROVENTIL) (2.5 MG/3ML) 0.083% nebulizer solution 2.5 mg; Take 3 mLs (2.5 mg total) by nebulization once. -     ipratropium (ATROVENT) nebulizer solution 0.5 mg; Take 2.5 mLs (0.5 mg total) by nebulization once. -     POCT glycosylated hemoglobin (Hb A1C)  Asthma with exacerbation, mild intermittent -     predniSONE (DELTASONE) 20 MG tablet; Take 3 in the morning for 3 days, then 2 in the morning for 3 days, and then 1 in the morning for 3 days.    The patient was advised to call or return to clinic if she does not see an improvement in symptoms or to seek the care of the closest emergency department if she worsens with the above plan.   Philis Fendt, MHS, PA-C [facility] 10/11/2015 7:00 PM

## 2015-10-11 ENCOUNTER — Encounter: Payer: Self-pay | Admitting: Physician Assistant

## 2015-10-17 ENCOUNTER — Ambulatory Visit: Payer: 59

## 2015-10-22 ENCOUNTER — Ambulatory Visit (INDEPENDENT_AMBULATORY_CARE_PROVIDER_SITE_OTHER): Payer: BLUE CROSS/BLUE SHIELD

## 2015-10-22 DIAGNOSIS — E538 Deficiency of other specified B group vitamins: Secondary | ICD-10-CM | POA: Diagnosis not present

## 2015-10-22 MED ORDER — CYANOCOBALAMIN 1000 MCG/ML IJ SOLN
1000.0000 ug | Freq: Once | INTRAMUSCULAR | Status: AC
  Administered 2015-10-22: 1000 ug via INTRAMUSCULAR

## 2015-10-31 ENCOUNTER — Ambulatory Visit: Payer: 59

## 2015-11-01 ENCOUNTER — Ambulatory Visit (INDEPENDENT_AMBULATORY_CARE_PROVIDER_SITE_OTHER): Payer: BLUE CROSS/BLUE SHIELD | Admitting: General Practice

## 2015-11-01 ENCOUNTER — Telehealth: Payer: Self-pay | Admitting: Internal Medicine

## 2015-11-01 DIAGNOSIS — E539 Vitamin B deficiency, unspecified: Secondary | ICD-10-CM

## 2015-11-01 MED ORDER — CYANOCOBALAMIN 1000 MCG/ML IJ SOLN
1000.0000 ug | Freq: Once | INTRAMUSCULAR | Status: AC
  Administered 2015-11-01: 1000 ug via INTRAMUSCULAR

## 2015-11-01 NOTE — Telephone Encounter (Signed)
For a few weeks feels like food sitting in her chest after eating. No impact dysphagia. Also having some wheezing and asthma sxs. On a diet that started with a cleanse....that is going ok. Was off PPI - restarted esopmeprazole says not much better.  Advised add prn Gaviscon and report back if not improving after resuming PPI.

## 2015-11-14 ENCOUNTER — Ambulatory Visit (INDEPENDENT_AMBULATORY_CARE_PROVIDER_SITE_OTHER): Payer: BLUE CROSS/BLUE SHIELD

## 2015-11-14 DIAGNOSIS — E538 Deficiency of other specified B group vitamins: Secondary | ICD-10-CM

## 2015-11-14 MED ORDER — CYANOCOBALAMIN 1000 MCG/ML IJ SOLN
1000.0000 ug | Freq: Once | INTRAMUSCULAR | Status: AC
  Administered 2015-11-14: 1000 ug via INTRAMUSCULAR

## 2015-11-19 ENCOUNTER — Other Ambulatory Visit: Payer: Self-pay

## 2015-11-19 MED ORDER — ALBUTEROL SULFATE HFA 108 (90 BASE) MCG/ACT IN AERS: 1.0000 | INHALATION_SPRAY | Freq: Four times a day (QID) | RESPIRATORY_TRACT | Status: DC | PRN

## 2015-11-22 ENCOUNTER — Other Ambulatory Visit: Payer: Self-pay | Admitting: Internal Medicine

## 2015-11-22 ENCOUNTER — Ambulatory Visit (INDEPENDENT_AMBULATORY_CARE_PROVIDER_SITE_OTHER): Payer: BLUE CROSS/BLUE SHIELD | Admitting: Internal Medicine

## 2015-11-22 ENCOUNTER — Encounter: Payer: Self-pay | Admitting: Internal Medicine

## 2015-11-22 VITALS — BP 100/70 | HR 87 | Ht 64.0 in | Wt 223.0 lb

## 2015-11-22 DIAGNOSIS — E538 Deficiency of other specified B group vitamins: Secondary | ICD-10-CM

## 2015-11-22 DIAGNOSIS — Z0000 Encounter for general adult medical examination without abnormal findings: Secondary | ICD-10-CM | POA: Diagnosis not present

## 2015-11-22 DIAGNOSIS — K219 Gastro-esophageal reflux disease without esophagitis: Secondary | ICD-10-CM | POA: Diagnosis not present

## 2015-11-22 DIAGNOSIS — L700 Acne vulgaris: Secondary | ICD-10-CM | POA: Diagnosis not present

## 2015-11-22 DIAGNOSIS — F411 Generalized anxiety disorder: Secondary | ICD-10-CM | POA: Diagnosis not present

## 2015-11-22 DIAGNOSIS — E559 Vitamin D deficiency, unspecified: Secondary | ICD-10-CM | POA: Insufficient documentation

## 2015-11-22 DIAGNOSIS — J4540 Moderate persistent asthma, uncomplicated: Secondary | ICD-10-CM

## 2015-11-22 MED ORDER — ALBUTEROL SULFATE HFA 108 (90 BASE) MCG/ACT IN AERS: 1.0000 | INHALATION_SPRAY | Freq: Four times a day (QID) | RESPIRATORY_TRACT | Status: DC | PRN

## 2015-11-22 MED ORDER — ALPRAZOLAM 0.25 MG PO TABS: 0.2500 mg | ORAL_TABLET | Freq: Two times a day (BID) | ORAL | Status: DC

## 2015-11-22 MED ORDER — BECLOMETHASONE DIPROPIONATE 40 MCG/ACT IN AERS: 2.0000 | INHALATION_SPRAY | Freq: Two times a day (BID) | RESPIRATORY_TRACT | Status: DC

## 2015-11-22 MED ORDER — FLUTICASONE PROPIONATE 50 MCG/ACT NA SUSP: 2.0000 | Freq: Every day | NASAL | Status: DC

## 2015-11-22 MED FILL — FLUTICASONE PROP 50 MCG SPR: 50 | 30 days supply | Qty: 16 | Fill #0

## 2015-11-22 NOTE — Assessment & Plan Note (Signed)
B12 shots 

## 2015-11-22 NOTE — Assessment & Plan Note (Signed)
On Vit D po 

## 2015-11-22 NOTE — Progress Notes (Signed)
Pre visit review using our clinic review tool, if applicable. No additional management support is needed unless otherwise documented below in the visit note. 

## 2015-11-22 NOTE — Assessment & Plan Note (Signed)
We discussed age appropriate health related issues, including available/recomended screening tests and vaccinations. We discussed a need for adhering to healthy diet and exercise. Labs/EKG were reviewed/ordered. All questions were answered. Husband had vasectomy - no BCP

## 2015-11-22 NOTE — Assessment & Plan Note (Signed)
3/17: Qvar and Proventil

## 2015-11-22 NOTE — Progress Notes (Signed)
Subjective:  Patient ID: Deanna Schwartz, female    DOB: 05/18/1981  Age: 35 y.o. MRN: [id number]  CC: Annual Exam   HPI Deanna Schwartz presents for a well exam. C/o asthma - pt did not like Advair (palpitations) - wants to try Qvar. F/u GERD, acne. C/o panic attacks at times  Outpatient Prescriptions Prior to Visit  Medication Sig Dispense Refill  . cyanocobalamin (COBAL-1000) 1000 MCG/ML injection Inject 1 mL (1,000 mcg total) into the muscle every 14 (fourteen) days. 10 mL 6  . ferrous sulfate 325 (65 FE) MG tablet Take 1 tablet (325 mg total) by mouth 2 (two) times daily with a meal. 100 tablet 1  . Vitamin D, Ergocalciferol, (DRISDOL) 50000 UNITS CAPS capsule Take 1 capsule (50,000 Units total) by mouth every 7 (seven) days. 12 capsule 0  . albuterol (PROVENTIL HFA;VENTOLIN HFA) 108 (90 Base) MCG/ACT inhaler Inhale 1 puff into the lungs every 6 (six) hours as needed for wheezing or shortness of breath. 18 g 0  . Fluticasone-Salmeterol (ADVAIR DISKUS) 250-50 MCG/DOSE AEPB Inhale 1 puff into the lungs 2 (two) times daily. 1 each 3  . dexlansoprazole (DEXILANT) 60 MG capsule Take 60 mg by mouth daily. Reported on 11/22/2015     No facility-administered medications prior to visit.    ROS Review of Systems  Constitutional: Negative for chills, activity change, appetite change, fatigue and unexpected weight change.  HENT: Negative for congestion, mouth sores and sinus pressure.   Eyes: Negative for visual disturbance.  Respiratory: Positive for wheezing. Negative for cough and chest tightness.   Gastrointestinal: Negative for nausea and abdominal pain.  Genitourinary: Negative for frequency, difficulty urinating and vaginal pain.  Musculoskeletal: Negative for back pain and gait problem.  Skin: Negative for pallor and rash.  Neurological: Negative for dizziness, tremors, weakness, numbness and headaches.  Psychiatric/Behavioral: Negative for suicidal ideas, confusion and sleep  disturbance. The patient is nervous/anxious.     Objective:  BP 100/70 mmHg  Pulse 87  Ht 5\' 4"  (1.626 m)  Wt 223 lb (101.152 kg)  BMI 38.26 kg/m2  SpO2 98%  BP Readings from Last 3 Encounters:  11/22/15 100/70  10/10/15 128/80  08/27/15 100/60    Wt Readings from Last 3 Encounters:  11/22/15 223 lb (101.152 kg)  10/10/15 225 lb (102.059 kg)  08/27/15 224 lb (101.606 kg)    Physical Exam  Constitutional: She appears well-developed. No distress.  HENT:  Head: Normocephalic.  Right Ear: External ear normal.  Left Ear: External ear normal.  Nose: Nose normal.  Mouth/Throat: Oropharynx is clear and moist.  Eyes: Conjunctivae are normal. Pupils are equal, round, and reactive to light. Right eye exhibits no discharge. Left eye exhibits no discharge.  Neck: Normal range of motion. Neck supple. No JVD present. No tracheal deviation present. No thyromegaly present.  Cardiovascular: Normal rate, regular rhythm and normal heart sounds.   Pulmonary/Chest: No stridor. No respiratory distress. She has no wheezes.  Abdominal: Soft. Bowel sounds are normal. She exhibits no distension and no mass. There is no tenderness. There is no rebound and no guarding.  Musculoskeletal: She exhibits no edema or tenderness.  Lymphadenopathy:    She has no cervical adenopathy.  Neurological: She displays normal reflexes. No cranial nerve deficit. She exhibits normal muscle tone. Coordination normal.  Skin: No rash noted. No erythema.  Psychiatric: She has a normal mood and affect. Her behavior is normal. Judgment and thought content normal.  Obese  Lab Results  Component Value Date  WBC 7.9 08/27/2015   HGB 11.0* 08/27/2015   HCT 34.4* 08/27/2015   PLT 260.0 08/27/2015   GLUCOSE 84 08/27/2015   CHOL 180 11/09/2014   TRIG 154.0* 11/09/2014   HDL 52.30 11/09/2014   LDLCALC 97 11/09/2014   ALT 13 06/22/2015   AST 18 06/22/2015   NA 138 08/27/2015   K 4.4 08/27/2015   CL 103 08/27/2015    CREATININE 0.77 08/27/2015   BUN 14 08/27/2015   CO2 27 08/27/2015   TSH 2.04 08/27/2015   HGBA1C 5.5 10/10/2015    Dg Lumbar Spine 2-3 Views  08/27/2015  CLINICAL DATA:  Chronic left-sided low back and rib cage pain without known injury. EXAM: LUMBAR SPINE - 2-3 VIEW COMPARISON:  AP view of the abdomen August 27, 2015 FINDINGS: The lumbar vertebral bodies are preserved in height. The disc space heights are well maintained. The pedicles and transverse processes are intact. There is no spondylolisthesis. No pars defects are demonstrated. The observed portions of the sacrum are normal. IMPRESSION: There is no acute or significant chronic bony abnormality of the lumbar spine. Electronically Signed   By: David  Martinique M.D.   On: 08/27/2015 08:50    Assessment & Plan:   Deanna Schwartz was seen today for annual exam.  Diagnoses and all orders for this visit:  Well adult exam  B12 deficiency  Asthma, moderate persistent, uncomplicated  Vitamin D deficiency  Generalized anxiety disorder  Acne vulgaris -     Ambulatory referral to Dermatology  Gastroesophageal reflux disease without esophagitis -     Ambulatory referral to Gastroenterology  Other orders -     albuterol (PROVENTIL HFA;VENTOLIN HFA) 108 (90 Base) MCG/ACT inhaler; Inhale 1-2 puffs into the lungs every 6 (six) hours as needed for wheezing or shortness of breath. -     beclomethasone (QVAR) 40 MCG/ACT inhaler; Inhale 2 puffs into the lungs 2 (two) times daily. -     fluticasone (FLONASE) 50 MCG/ACT nasal spray; Place 2 sprays into both nostrils daily. -     ALPRAZolam (XANAX) 0.25 MG tablet; Take 1 tablet (0.25 mg total) by mouth 2 (two) times daily.  I have discontinued Deanna Schwartz's Fluticasone-Salmeterol. I have also changed her albuterol. Additionally, I am having her start on beclomethasone, fluticasone, and ALPRAZolam. Lastly, I am having her maintain her dexlansoprazole, ferrous sulfate, Vitamin D (Ergocalciferol),  cyanocobalamin, and esomeprazole.  Meds ordered this encounter  Medications  . esomeprazole (NEXIUM) 40 MG capsule    Sig: Take 40 mg by mouth daily at 12 noon.  [name] albuterol (PROVENTIL HFA;VENTOLIN HFA) 108 (90 Base) MCG/ACT inhaler    Sig: Inhale 1-2 puffs into the lungs every 6 (six) hours as needed for wheezing or shortness of breath.    Dispense:  18 g    Refill:  5  . beclomethasone (QVAR) 40 MCG/ACT inhaler    Sig: Inhale 2 puffs into the lungs 2 (two) times daily.    Dispense:  1 Inhaler    Refill:  12  . fluticasone (FLONASE) 50 MCG/ACT nasal spray    Sig: Place 2 sprays into both nostrils daily.    Dispense:  16 g    Refill:  6  . ALPRAZolam (XANAX) 0.25 MG tablet    Sig: Take 1 tablet (0.25 mg total) by mouth 2 (two) times daily.    Dispense:  60 tablet    Refill:  0     Follow-up: No Follow-up on file.  [name], MD

## 2015-11-22 NOTE — Assessment & Plan Note (Addendum)
Panic attacks Stress w/3 kids, working part time Xanax prn - rare  Potential benefits of a long term benzodiazepines  use as well as potential risks  and complications were explained to the patient and were aknowledged.

## 2015-11-28 ENCOUNTER — Ambulatory Visit: Payer: 59

## 2015-11-28 ENCOUNTER — Telehealth: Payer: Self-pay

## 2015-11-28 NOTE — Telephone Encounter (Signed)
Health Examination Certification received and signed.  Original left in cabinet for pt pick up. Pt advised of same

## 2015-11-29 ENCOUNTER — Ambulatory Visit: Payer: BLUE CROSS/BLUE SHIELD

## 2015-12-04 ENCOUNTER — Ambulatory Visit: Payer: BLUE CROSS/BLUE SHIELD

## 2015-12-25 ENCOUNTER — Encounter: Payer: Self-pay | Admitting: Internal Medicine

## 2015-12-26 ENCOUNTER — Other Ambulatory Visit: Payer: Self-pay | Admitting: Internal Medicine

## 2015-12-26 DIAGNOSIS — E559 Vitamin D deficiency, unspecified: Secondary | ICD-10-CM

## 2015-12-26 DIAGNOSIS — E538 Deficiency of other specified B group vitamins: Secondary | ICD-10-CM

## 2015-12-27 ENCOUNTER — Other Ambulatory Visit (INDEPENDENT_AMBULATORY_CARE_PROVIDER_SITE_OTHER): Payer: BLUE CROSS/BLUE SHIELD

## 2015-12-27 DIAGNOSIS — E559 Vitamin D deficiency, unspecified: Secondary | ICD-10-CM | POA: Diagnosis not present

## 2015-12-27 DIAGNOSIS — E538 Deficiency of other specified B group vitamins: Secondary | ICD-10-CM

## 2015-12-27 LAB — BASIC METABOLIC PANEL
BUN: 13 mg/dL (ref 6–23)
CALCIUM: 9.4 mg/dL (ref 8.4–10.5)
CHLORIDE: 102 meq/L (ref 96–112)
CO2: 28 meq/L (ref 19–32)
Creatinine, Ser: 0.85 mg/dL (ref 0.40–1.20)
GFR: 98.54 mL/min (ref >60.00)
GLUCOSE: 94 mg/dL (ref 70–99)
POTASSIUM: 3.9 meq/L (ref 3.5–5.1)
SODIUM: 138 meq/L (ref 135–145)

## 2015-12-27 LAB — CBC WITH DIFFERENTIAL/PLATELET
BASOS PCT: 0.4 % (ref 0.0–3.0)
Basophils Absolute: 0 10*3/uL (ref 0.0–0.1)
EOS PCT: 8.5 % — AB (ref 0.0–5.0)
Eosinophils Absolute: 0.7 10*3/uL (ref 0.0–0.7)
HCT: 33.5 % — ABNORMAL LOW (ref 36.0–46.0)
Hemoglobin: 10.8 g/dL — ABNORMAL LOW (ref 12.0–15.0)
LYMPHS ABS: 2.3 10*3/uL (ref 0.7–4.0)
Lymphocytes Relative: 27.8 % (ref 12.0–46.0)
MCHC: 32.2 g/dL (ref 30.0–36.0)
MCV: 75.3 fl — AB (ref 78.0–100.0)
MONOS PCT: 7.2 % (ref 3.0–12.0)
Monocytes Absolute: 0.6 10*3/uL (ref 0.1–1.0)
NEUTROS ABS: 4.7 10*3/uL (ref 1.4–7.7)
NEUTROS PCT: 56.1 % (ref 43.0–77.0)
PLATELETS: 258 10*3/uL (ref 150.0–400.0)
RBC: 4.46 Mil/uL (ref 3.87–5.11)
RDW: 15.4 % (ref 11.5–15.5)
WBC: 8.4 10*3/uL (ref 4.0–10.5)

## 2015-12-27 LAB — VITAMIN B12: Vitamin B-12: 300 pg/mL (ref 211–911)

## 2015-12-27 LAB — VITAMIN D 25 HYDROXY (VIT D DEFICIENCY, FRACTURES): VITD: 26.83 ng/mL — ABNORMAL LOW (ref 30.00–100.00)

## 2016-01-01 MED FILL — DOXYCYCLINE HYCLATE 100 MG: 100 | 30 days supply | Qty: 60 | Fill #0

## 2016-01-01 MED FILL — FLUCONAZOLE 150 MG TABLET: 150 | 8 days supply | Qty: 4 | Fill #0

## 2016-01-28 ENCOUNTER — Encounter: Payer: Self-pay | Admitting: Internal Medicine

## 2016-02-18 MED FILL — CLOBETASOL 0.05% CREAM: 0.05 | 30 days supply | Qty: 60 | Fill #0

## 2016-02-25 ENCOUNTER — Telehealth: Payer: Self-pay | Admitting: Family Medicine

## 2016-02-25 NOTE — Telephone Encounter (Signed)
Possible double book on wed at 230 is all I have at moment.

## 2016-02-25 NOTE — Telephone Encounter (Signed)
Would like to know if she could be worked in some time.  States knee is hurting really bad today.

## 2016-02-25 NOTE — Telephone Encounter (Signed)
Spoke with patient, scheduled for Wednesday at 2:45pm

## 2016-02-27 ENCOUNTER — Other Ambulatory Visit: Payer: Self-pay

## 2016-02-27 ENCOUNTER — Encounter: Payer: Self-pay | Admitting: Family Medicine

## 2016-02-27 ENCOUNTER — Ambulatory Visit (INDEPENDENT_AMBULATORY_CARE_PROVIDER_SITE_OTHER): Payer: BLUE CROSS/BLUE SHIELD | Admitting: Family Medicine

## 2016-02-27 VITALS — BP 112/82 | HR 78 | Ht 64.0 in | Wt 224.0 lb

## 2016-02-27 DIAGNOSIS — E559 Vitamin D deficiency, unspecified: Secondary | ICD-10-CM | POA: Diagnosis not present

## 2016-02-27 DIAGNOSIS — M25562 Pain in left knee: Secondary | ICD-10-CM

## 2016-02-27 DIAGNOSIS — M222X9 Patellofemoral disorders, unspecified knee: Secondary | ICD-10-CM

## 2016-02-27 DIAGNOSIS — D500 Iron deficiency anemia secondary to blood loss (chronic): Secondary | ICD-10-CM

## 2016-02-27 MED ORDER — VITAMIN D (ERGOCALCIFEROL) 1.25 MG (50000 UNIT) PO CAPS: 50000.0000 [IU] | ORAL_CAPSULE | ORAL | Status: DC

## 2016-02-27 MED ORDER — IBUPROFEN-FAMOTIDINE 800-26.6 MG PO TABS: 1.0000 | ORAL_TABLET | Freq: Three times a day (TID) | ORAL | Status: DC | PRN

## 2016-02-27 MED FILL — VIT D2 1.25 MG (50,000 UNIT: 1.25 MG | 84 days supply | Qty: 12 | Fill #0

## 2016-02-27 NOTE — Patient Instructions (Signed)
Good to see you Duexis up to 3 times a day Ice 20 minutes 2 times daily. Usually after activity and before bed. Once weekly vitamin D for 12 weeks.  Continue the iron but take it with 500mg  of Vitamin C Bromelain 2500GCU with each meal robitoic 10 billion units wit 10 strains or more.  See me again in 4 weeks and we will discuss what is next if in pain

## 2016-02-27 NOTE — Progress Notes (Signed)
[name] [address], [place] [address] Phone: [phone] Subjective:      CC: Left knee pain  [id number] Deanna Schwartz is a 35 y.o. female coming in with complaint of left knee pain. Patient has known patellofemoral disease. Has had this intermittently for 2 years but it was on the contralateral side. Patient states that unfortunately that the pain seems to be worsening on the left side. Noticing swelling from time to time. Seems to be locking. No new injury. Anterior aspect of the knee. Worse with going up and down stairs. Has not been doing the exercises or icing regularly. Only taking ibuprofen occasionally for pain. Patient is going out of town and will be doing a significant amount walking and states that at the moment she would not be able to do so.     Past Medical History  Diagnosis Date  . Asthma   . Anxiety   . GERD (gastroesophageal reflux disease)   . Allergic rhinitis   . Chronic constipation   . Anemia   . H. pylori infection 2008    Hx of tx + serology  . Panic attack   . Irritable bowel syndrome   . Obesity   . 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 Iron deficiency anemia 08/06/2011    Hgb 10.2 and MCV 79 on health screen labs    Past Surgical History  Procedure Laterality Date  . Nasal sinus surgery    . Flexible sigmoidoscopy  04/14/2008    normal  . Upper gastrointestinal endoscopy     Social History   Social History  . Marital Status: Married    Spouse Name: N/A  . Number of Children: 3  . Years of Education: N/A   Occupational History  . [place] [employer]   Social History Main Topics  . Smoking status: Never Smoker   . Smokeless tobacco: Never Used  . Alcohol Use: 0.0 oz/week    0 Standard drinks or equivalent per week     Comment: Social maybe twice amonth, beer  . Drug Use: No  . Sexual Activity: Yes    Birth Control/ Protection: None    Other Topics Concern  . None   Social History Narrative   HS[employer], [employer] college in [place]. Occupation: [employer]. married  in Oct 2009. Son born in 2009, [place] dtrs - '07, '11. Marriage in good health.   Allergies  Allergen Reactions  . Augmentin [Amoxicillin-Pot Clavulanate] Rash  . Cephalexin Rash  . Iodine Swelling and Rash    Throat swelling after eating shrimp   Family History  Problem Relation Age of Onset  . Diabetes      grandmother  . Hypertension    . Colon cancer Neg Hx   . Hypertension Mother   . Hypertension Father   . Diabetes Maternal Aunt   . Diabetes Maternal Aunt   . Diabetes Paternal Grandmother   . Colon polyps Paternal Uncle     x4    Past medical history, social, surgical and family history all reviewed in electronic medical record.  No pertanent information unless stated regarding to the chief complaint.   Review of Systems: No headache, visual changes, nausea, vomiting, diarrhea, constipation, dizziness, abdominal pain, skin rash, fevers, chills, night sweats, weight loss, swollen lymph nodes, body aches, joint swelling, muscle aches, chest pain, shortness of breath, mood changes.  Objective Blood pressure 112/82, pulse 78, height 5\' 4"  (1.626 m), weight 224 lb (101.606 kg), SpO2 99 %.  General: No apparent distress alert and oriented x3 mood and affect normal, dressed appropriately.  HEENT: Pupils equal, extraocular movements intact  Respiratory: Patient's speak in full sentences and does not appear short of breath  Cardiovascular: No lower extremity edema, non tender, no erythema  Skin: Warm dry intact with no signs of infection or rash on extremities or on axial skeleton.  Abdomen: Soft nontender  Neuro: Cranial nerves II through XII are intact, neurovascularly intact in all extremities with 2+ DTRs and 2+ pulses.  Lymph: No lymphadenopathy of posterior or anterior cervical chain or axillae bilaterally.  Gait normal with good  balance and coordination.  MSK:  Non tender with full range of motion and good stability and symmetric strength and tone of shoulders, elbows, wrist, hip, and ankles bilaterally.  Knee: Left Trace effusion noted Tender to palpation over the anterior lateral joint line ROM full in flexion and extension and lower leg rotation. Ligaments with solid consistent endpoints including ACL, PCL, LCL, MCL. Negative Mcmurray's, Apley's, and Thessalonian tests.  painful patellar compression. Patellar glide with moderate crepitus. Patellar and quadriceps tendons unremarkable. Hamstring and quadriceps strength is normal.  Contralateral knee has crepitus but no pain  After informed written and verbal consent, patient was seated on exam table. Left knee was prepped with alcohol swab and utilizing anterolateral approach, patient's left knee space was injected with 4:1  marcaine 0.5%: Kenalog 40mg /dL. Patient tolerated the procedure well without immediate complications.   Impression and Recommendations:     This case required medical decision making of moderate complexity.      Note: This dictation was prepared with Dragon dictation along with smaller phrase technology. Any transcriptional errors that result from this process are unintentional.

## 2016-02-27 NOTE — Assessment & Plan Note (Signed)
Encouraged patient to continue supplementation and vitamin C to help with absorption.

## 2016-02-27 NOTE — Assessment & Plan Note (Signed)
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 With patient's history of intermittent polyarthralgia and some swelling I do feel that laboratory workup for autoimmune diseases could be warranted if she does not make significant improvement. We discussed icing regimen, we discussed possible bracing which patient declined. Patient given topical anti-inflammatories. We discussed optimization of different deficiencies including vitamin D deficiency and iron. We discussed which activities to potentially avoid. Patient will come back and see me again in 4 weeks. At that time we'll consider formal physical therapy as well as further workup including MRI.

## 2016-02-27 NOTE — Assessment & Plan Note (Signed)
Refills once weekly vitamin D

## 2016-02-27 NOTE — Progress Notes (Signed)
Pre visit review using our clinic review tool, if applicable. No additional management support is needed unless otherwise documented below in the visit note. 

## 2016-03-06 ENCOUNTER — Encounter: Payer: Self-pay | Admitting: Family Medicine

## 2016-03-14 ENCOUNTER — Ambulatory Visit (INDEPENDENT_AMBULATORY_CARE_PROVIDER_SITE_OTHER): Payer: BLUE CROSS/BLUE SHIELD | Admitting: Internal Medicine

## 2016-03-14 ENCOUNTER — Encounter: Payer: Self-pay | Admitting: Internal Medicine

## 2016-03-14 ENCOUNTER — Other Ambulatory Visit (INDEPENDENT_AMBULATORY_CARE_PROVIDER_SITE_OTHER): Payer: BLUE CROSS/BLUE SHIELD

## 2016-03-14 VITALS — BP 120/82 | HR 70 | Temp 98.0°F | Resp 14 | Ht 63.0 in | Wt 221.0 lb

## 2016-03-14 DIAGNOSIS — R55 Syncope and collapse: Secondary | ICD-10-CM

## 2016-03-14 DIAGNOSIS — R1011 Right upper quadrant pain: Secondary | ICD-10-CM

## 2016-03-14 LAB — COMPREHENSIVE METABOLIC PANEL
ALT: 14 U/L (ref 0–35)
AST: 18 U/L (ref 0–37)
Albumin: 4.1 g/dL (ref 3.5–5.2)
Alkaline Phosphatase: 74 U/L (ref 39–117)
BUN: 15 mg/dL (ref 6–23)
CHLORIDE: 101 meq/L (ref 96–112)
CO2: 32 mEq/L (ref 19–32)
Calcium: 9.5 mg/dL (ref 8.4–10.5)
Creatinine, Ser: 0.77 mg/dL (ref 0.40–1.20)
GFR: 110.31 mL/min (ref >60.00)
GLUCOSE: 94 mg/dL (ref 70–99)
POTASSIUM: 4.6 meq/L (ref 3.5–5.1)
SODIUM: 136 meq/L (ref 135–145)
Total Bilirubin: 0.3 mg/dL (ref 0.2–1.2)
Total Protein: 7.9 g/dL (ref 6.0–8.3)

## 2016-03-14 LAB — CBC
HCT: 33.6 % — ABNORMAL LOW (ref 36.0–46.0)
Hemoglobin: 10.8 g/dL — ABNORMAL LOW (ref 12.0–15.0)
MCHC: 32.1 g/dL (ref 30.0–36.0)
MCV: 74 fl — AB (ref 78.0–100.0)
PLATELETS: 316 10*3/uL (ref 150.0–400.0)
RBC: 4.53 Mil/uL (ref 3.87–5.11)
RDW: 14.7 % (ref 11.5–15.5)
WBC: 8.4 10*3/uL (ref 4.0–10.5)

## 2016-03-14 LAB — LIPASE: LIPASE: 23 U/L (ref 11.0–59.0)

## 2016-03-14 LAB — FERRITIN: Ferritin: 6.6 ng/mL — ABNORMAL LOW (ref 10.0–291.0)

## 2016-03-14 NOTE — Assessment & Plan Note (Signed)
[place] RUQ limited ordered today. She would like to get done before trip (leaving on Tuesday) and advised her that we would try but may not be able to do this. Not typical for gallbladder symptoms. Suspect musculoskeletal pain with the syncope.

## 2016-03-14 NOTE — Assessment & Plan Note (Signed)
Checking CMP and CBC and lipase and ferritin. Given stomach symptoms the lipase. Prior history of anemia and CBC to ensure not worsening. Suspect vasovagal given she had not eaten and drank several alcoholic beverages. Advised that she stay well hydrated and not drink more than 2 alcoholic beverages in any 1 day.

## 2016-03-14 NOTE — Progress Notes (Signed)
   Subjective:    Patient ID: Deanna Schwartz, female    DOB: 09-30-1980, 35 y.o.   MRN: [id number]  HPI The patient is a 35 YO female coming in for episode of syncope. She was sitting at dinner and then passed out. Per reports from her family they thought she fell asleep. She was arouseable quickly and then was not confused afterwards. They do not report any shaking or tremors and no loss of bladder or bowel. She had not eaten that day well and had drank several alcoholic beverages. Since that episode she has had some RUQ pain worse with moving and bending down. Not bothered by foods or drinks. She is worried about gallbladder problems and wants to get this checked. Otherwise feeling well and not feeling sick at all.  She has had cardiology evaluation in the past with echo and holter and EKGs which were normal. She denies chest pain or palpitations. No recurrent episodes. EKG from 2/17 reviewed during the visit today with her.   Review of Systems  Constitutional: Negative.   HENT: Negative.   Eyes: Negative.   Respiratory: Negative.   Cardiovascular: Negative.   Gastrointestinal: Negative.   Musculoskeletal: Positive for myalgias and arthralgias.  Skin: Negative.   Neurological: Positive for syncope. Negative for dizziness, weakness, light-headedness, numbness and headaches.  Psychiatric/Behavioral: Negative.       Objective:   Physical Exam  Constitutional: She is oriented to person, place, and time. She appears well-developed and well-nourished.  Overweight  HENT:  Head: Normocephalic and atraumatic.  Eyes: EOM are normal.  Neck: Normal range of motion.  Cardiovascular: Normal rate and regular rhythm.   Pulmonary/Chest: Effort normal and breath sounds normal. No respiratory distress. She has no wheezes.  Abdominal: Soft. Bowel sounds are normal. She exhibits no distension. There is no tenderness. There is no rebound.  Musculoskeletal: She exhibits tenderness.  Some tenderness in  the RUQ to touch and with twisting.   Neurological: She is alert and oriented to person, place, and time.  Skin: Skin is warm and dry.  Psychiatric: She has a normal mood and affect.   Filed Vitals:   03/14/16 1113  BP: 120/82  Pulse: 70  Temp: 98 F (36.7 C)  TempSrc: Oral  Resp: 14  Height: 5\' 3"  (1.6 m)  Weight: 221 lb (100.245 kg)  SpO2: 99%      Assessment & Plan:

## 2016-03-14 NOTE — Patient Instructions (Signed)
We will check the blood work today and get the ultrasound of the stomach to check the gallbladder.   If the labs are okay you are safe to travel but I would be careful about eating and drinking well. Try to limit alcohol intake to 2 drinks per day maximum.   Syncope, commonly known as fainting, is a temporary loss of consciousness. It occurs when the blood flow to the brain is reduced. Vasovagal syncope (also called neurocardiogenic syncope) is a fainting spell in which the blood flow to the brain is reduced because of a sudden drop in heart rate and blood pressure. Vasovagal syncope occurs when the brain and the cardiovascular system (blood vessels) do not adequately communicate and respond to each other. This is the most common cause of fainting. It often occurs in response to fear or some other type of emotional or physical stress. The body has a reaction in which the heart starts beating too slowly or the blood vessels expand, reducing blood pressure. This type of fainting spell is generally considered harmless. However, injuries can occur if a person takes a sudden fall during a fainting spell.  CAUSES  Vasovagal syncope occurs when a person's blood pressure and heart rate decrease suddenly, usually in response to a trigger. Many things and situations can trigger an episode. Some of these include:   Pain.   Fear.   The sight of blood or medical procedures, such as blood being drawn from a vein.   Common activities, such as coughing, swallowing, stretching, or going to the bathroom.   Emotional stress.   Prolonged standing, especially in a warm environment.   Lack of sleep or rest.   Prolonged lack of food.   Prolonged lack of fluids.   Recent illness.  The use of certain drugs that affect blood pressure, such as cocaine, alcohol, marijuana, inhalants, and opiates.  SYMPTOMS  Before the fainting episode, you may:   Feel dizzy or light headed.   Become pale.  Sense  that you are going to faint.   Feel like the room is spinning.   Have tunnel vision, only seeing directly in front of you.   Feel sick to your stomach (nauseous).   See spots or slowly lose vision.   Hear ringing in your ears.   Have a headache.   Feel warm and sweaty.   Feel a sensation of pins and needles. During the fainting spell, you will generally be unconscious for no longer than a couple minutes before waking up and returning to normal. If you get up too quickly before your body can recover, you may faint again. Some twitching or jerky movements may occur during the fainting spell.  DIAGNOSIS  Your health care provider will ask about your symptoms, take a medical history, and perform a physical exam. Various tests may be done to rule out other causes of fainting. These may include blood tests and tests to check the heart, such as electrocardiography, echocardiography, and possibly an electrophysiology study. When other causes have been ruled out, a test may be done to check the body's response to changes in position (tilt table test). TREATMENT  Most cases of vasovagal syncope do not require treatment. Your health care provider may recommend ways to avoid fainting triggers and may provide home strategies for preventing fainting. If you must be exposed to a possible trigger, you can drink additional fluids to help reduce your chances of having an episode of vasovagal syncope. If you have warning signs  of an oncoming episode, you can respond by positioning yourself favorably (lying down). If your fainting spells continue, you may be given medicines to prevent fainting. Some medicines may help make you more resistant to repeated episodes of vasovagal syncope. Special exercises or compression stockings may be recommended. In rare cases, the surgical placement of a pacemaker is considered. HOME CARE INSTRUCTIONS   Learn to identify the warning signs of vasovagal syncope.   Sit  or lie down at the first warning sign of a fainting spell. If sitting, put your head down between your legs. If you lie down, swing your legs up in the air to increase blood flow to the brain.   Avoid hot tubs and saunas.  Avoid prolonged standing.  Drink enough fluids to keep your urine clear or pale yellow. Avoid caffeine.  Increase salt in your diet as directed by your health care provider.   If you have to stand for a long time, perform movements such as:   Crossing your legs.   Flexing and stretching your leg muscles.   Squatting.   Moving your legs.   Bending over.   Only take over-the-counter or prescription medicines as directed by your health care provider. Do not suddenly stop any medicines without asking your health care provider first. [place] IF:   Your fainting spells continue or happen more frequently in spite of treatment.   You lose consciousness for more than a couple minutes.  You have fainting spells during or after exercising or after being startled.   You have new symptoms that occur with the fainting spells, such as:   Shortness of breath.  Chest pain.   Irregular heartbeat.   You have episodes of twitching or jerky movements that last longer than a few seconds.  You have episodes of twitching or jerky movements without obvious fainting. SEEK IMMEDIATE MEDICAL CARE IF:   You have injuries or bleeding after a fainting spell.   You have episodes of twitching or jerky movements that last longer than 5 minutes.   You have more than one spell of twitching or jerky movements before returning to consciousness after fainting.   This information is not intended to replace advice given to you by your health care provider. Make sure you discuss any questions you have with your health care provider.   Document Released: 08/11/2012 Document Revised: 01/09/2015 Document Reviewed: 08/11/2012 Elsevier Interactive Patient Education  [employer].

## 2016-03-14 NOTE — Progress Notes (Signed)
Pre visit review using our clinic review tool, if applicable. No additional management support is needed unless otherwise documented below in the visit note. 

## 2016-03-17 ENCOUNTER — Ambulatory Visit
Admission: RE | Admit: 2016-03-17 | Discharge: 2016-03-17 | Disposition: A | Discharge status: Home / Self Care | Payer: BLUE CROSS/BLUE SHIELD | Source: Ambulatory Visit | Attending: Internal Medicine | Admitting: Internal Medicine

## 2016-03-17 ENCOUNTER — Other Ambulatory Visit: Payer: BLUE CROSS/BLUE SHIELD

## 2016-03-17 DIAGNOSIS — R1011 Right upper quadrant pain: Secondary | ICD-10-CM

## 2016-05-09 MED FILL — VENTOLIN HFA 90 MCG INHALER: 108 (90 BAS | 25 days supply | Qty: 18 | Fill #0

## 2016-05-30 ENCOUNTER — Ambulatory Visit (INDEPENDENT_AMBULATORY_CARE_PROVIDER_SITE_OTHER): Payer: BLUE CROSS/BLUE SHIELD | Admitting: Physician Assistant

## 2016-05-30 VITALS — BP 110/64 | HR 75 | Temp 98.4°F | Resp 16 | Ht 63.0 in | Wt 222.0 lb

## 2016-05-30 DIAGNOSIS — M545 Low back pain, unspecified: Secondary | ICD-10-CM

## 2016-05-30 LAB — POCT URINALYSIS DIP (MANUAL ENTRY)
BILIRUBIN UA: NEGATIVE
Bilirubin, UA: NEGATIVE
Glucose, UA: NEGATIVE
LEUKOCYTES UA: NEGATIVE
NITRITE UA: NEGATIVE
PH UA: 7.5
Spec Grav, UA: 1.015
Urobilinogen, UA: 0.2

## 2016-05-30 LAB — POC MICROSCOPIC URINALYSIS (UMFC): Mucus: ABSENT

## 2016-05-30 LAB — POCT URINE PREGNANCY: Preg Test, Ur: NEGATIVE

## 2016-05-30 MED ORDER — CYCLOBENZAPRINE HCL 10 MG PO TABS: 5.0000 mg | ORAL_TABLET | Freq: Three times a day (TID) | ORAL | 0 refills | Status: DC | PRN

## 2016-05-30 MED ORDER — NAPROXEN 500 MG PO TABS: 500.0000 mg | ORAL_TABLET | Freq: Two times a day (BID) | ORAL | 0 refills | Status: DC

## 2016-05-30 MED FILL — NAPROXEN 500 MG TABLET: 500 | 15 days supply | Qty: 30 | Fill #0

## 2016-05-30 MED FILL — CYCLOBENZAPRINE 10 MG TAB: 10 | 10 days supply | Qty: 30 | Fill #0

## 2016-05-30 NOTE — Progress Notes (Signed)
05/30/2016 1:01 PM   DOB: May 19, 1981 / MRN: [id number]  SUBJECTIVE:  Deanna Schwartz is a 35 y.o. female presenting for left sided back pain that started today.  Feels that it is getting worse.  The pain better with standing and lying down.  Denies any dysuria, urgency, frequency. Her husband has had a vasectomy.  She is on her cycle right now.  Denies fever, chills, nausea.    She is allergic to augmentin [amoxicillin-pot clavulanate]; cephalexin; and iodine.   She  has a past medical history of Allergic rhinitis; Anemia; Anxiety; Asthma; B12 deficiency with anti-parietal cell antibodies + (03/25/2011); Chronic constipation; GERD (gastroesophageal reflux disease); H. pylori infection (2008); Iron deficiency anemia (08/06/2011); Irritable bowel syndrome; Obesity; and Panic attack.    She  reports that she has never smoked. She has never used smokeless tobacco. She reports that she drinks alcohol. She reports that she does not use drugs. She  reports that she currently engages in sexual activity. She reports using the following method of birth control/protection: None. The patient  has a past surgical history that includes Nasal sinus surgery; Flexible sigmoidoscopy (04/14/2008); and Upper gastrointestinal endoscopy.  Her family history includes Colon polyps in her paternal uncle; Diabetes in her maternal aunt, maternal aunt, and paternal grandmother; Hypertension in her father and mother.  Review of Systems  Respiratory: Negative for cough.   Cardiovascular: Negative for chest pain.  Gastrointestinal: Negative for nausea.  Genitourinary: Negative for dysuria.  Musculoskeletal: Positive for back pain and myalgias.  Neurological: Negative for dizziness.    The problem list and medications were reviewed and updated by myself where necessary and exist elsewhere in the encounter.   OBJECTIVE:  BP 110/64   Pulse 75   Temp 98.4 F (36.9 C) (Oral)   Resp 16   Ht 5\' 3"  (1.6 m)   Wt 222 lb  (100.7 kg)   SpO2 98%   BMI 39.33 kg/m   Physical Exam  Constitutional: She is oriented to person, place, and time. She appears well-developed and well-nourished.  Cardiovascular: Normal rate and regular rhythm.   Pulmonary/Chest: Effort normal and breath sounds normal.  Abdominal: Soft. Bowel sounds are normal.  Musculoskeletal: Normal range of motion.  Neurological: She is alert and oriented to person, place, and time.  Skin: Skin is warm and dry.    Results for orders placed or performed in visit on 05/30/16 (from the past 72 hour(s))  POCT urine pregnancy     Status: None   Collection Time: 05/30/16 12:44 PM  Result Value Ref Range   Preg Test, Ur Negative Negative  POCT urinalysis dipstick     Status: Abnormal   Collection Time: 05/30/16 12:45 PM  Result Value Ref Range   Color, UA yellow yellow   Clarity, UA clear clear   Glucose, UA negative negative   Bilirubin, UA negative negative   Ketones, POC UA negative negative   Spec Grav, UA 1.015    Blood, UA large (A) negative   pH, UA 7.5    Protein Ur, POC trace (A) negative   Urobilinogen, UA 0.2    Nitrite, UA Negative Negative   Leukocytes, UA Negative Negative  POCT Microscopic Urinalysis ([facility])     Status: Abnormal   Collection Time: 05/30/16 12:47 PM  Result Value Ref Range   WBC,UR,HPF,POC None None WBC/hpf   RBC,UR,HPF,POC Too numerous to count  (A) None RBC/hpf   Bacteria Few (A) None, Too numerous to count   Mucus Absent  Absent   Epithelial Cells, UR Per Microscopy Few (A) None, Too numerous to count cells/hpf    No results found.  ASSESSMENT AND PLAN  Kendyll was seen today for back pain and nasal congestion.  Diagnoses and all orders for this visit:  Left-sided low back pain without sciatica: She is currently on her cycle.  No leuks in her urine.  Will treat MSK etiology.  See below. RTC 3 weeks if not improved, sooner if worse.  -     POCT urinalysis dipstick -     POCT urine pregnancy -      POCT Microscopic Urinalysis ([facility]) -     naproxen (NAPROSYN) 500 MG tablet; Take 1 tablet (500 mg total) by mouth 2 (two) times daily with a meal. -     cyclobenzaprine (FLEXERIL) 10 MG tablet; Take 0.5-1 tablets (5-10 mg total) by mouth 3 (three) times daily as needed for muscle spasms.    The patient is advised to call or return to clinic if she does not see an improvement in symptoms, or to seek the care of the closest emergency department if she worsens with the above plan.   Philis Fendt, MHS, PA-C [facility] 05/30/2016 1:01 PM

## 2016-05-30 NOTE — Patient Instructions (Signed)
     IF you received an x-ray today, you will receive an invoice from [facility]. Please contact [facility] at [phone] with questions or concerns regarding your invoice.   IF you received labwork today, you will receive an invoice from [facility]. Please contact [facility] at [phone] with questions or concerns regarding your invoice.   Our billing staff will not be able to assist you with questions regarding bills from these companies.  You will be contacted with the lab results as soon as they are available. The fastest way to get your results is to activate your My Chart account. Instructions are located on the last page of this paperwork. If you have not heard from us regarding the results in 2 weeks, please contact this office.      

## 2016-06-03 MED FILL — VENTOLIN HFA 90 MCG INHALER: 108 (90 BAS | 25 days supply | Qty: 18 | Fill #1

## 2016-06-10 ENCOUNTER — Ambulatory Visit (INDEPENDENT_AMBULATORY_CARE_PROVIDER_SITE_OTHER): Payer: BLUE CROSS/BLUE SHIELD | Admitting: Family Medicine

## 2016-06-10 ENCOUNTER — Encounter: Payer: Self-pay | Admitting: Family Medicine

## 2016-06-10 VITALS — BP 108/80 | HR 80 | Wt 224.0 lb

## 2016-06-10 DIAGNOSIS — M533 Sacrococcygeal disorders, not elsewhere classified: Secondary | ICD-10-CM | POA: Diagnosis not present

## 2016-06-10 DIAGNOSIS — M999 Biomechanical lesion, unspecified: Secondary | ICD-10-CM

## 2016-06-10 MED ORDER — VITAMIN D (ERGOCALCIFEROL) 1.25 MG (50000 UNIT) PO CAPS: 50000.0000 [IU] | ORAL_CAPSULE | ORAL | 0 refills | Status: DC

## 2016-06-10 MED ORDER — PREDNISONE 50 MG PO TABS: 50.0000 mg | ORAL_TABLET | Freq: Every day | ORAL | 0 refills | Status: DC

## 2016-06-10 NOTE — Assessment & Plan Note (Signed)
Decision today to treat with OMT was based on Physical Exam  After verbal consent patient was treated with HVLA, ME, FPR techniques in cervical, thoracic, lumbar and rib areas  Patient tolerated the procedure well with improvement in symptoms  Patient given exercises, stretches and lifestyle modifications  See medications in patient instructions if given  Patient will follow up in 2 weeks
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
## 2016-06-10 NOTE — Assessment & Plan Note (Signed)
Patient does have pain in this area. We did discuss the possibility of x-rays but this would not change medical management. Patient will be started on prednisone for hopefully more of a contusion as well as once weekly vitamin D in case there is any type of fracture. No true radicular symptoms with a negative straight leg test bilaterally. Patient has full strength. Did respond well to osteopathic manipulation. We'll continue to monitor. Follow-up again in 2-3 weeks.

## 2016-06-10 NOTE — Patient Instructions (Signed)
Good to see you  Ice is your friend  Sit on a donut Prednisone daily for 5 day  Once weekly vitamin D again  Stay active but not on 4 wheelers.  See me again in 2-3 weeks,

## 2016-06-11 ENCOUNTER — Ambulatory Visit: Payer: BLUE CROSS/BLUE SHIELD | Admitting: Family Medicine
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
## 2016-07-07 ENCOUNTER — Encounter: Payer: Self-pay | Admitting: Family Medicine

## 2016-07-07 ENCOUNTER — Ambulatory Visit (INDEPENDENT_AMBULATORY_CARE_PROVIDER_SITE_OTHER): Payer: BLUE CROSS/BLUE SHIELD | Admitting: Family Medicine

## 2016-07-07 VITALS — BP 116/74 | HR 102 | Wt 226.0 lb

## 2016-07-07 DIAGNOSIS — M999 Biomechanical lesion, unspecified: Secondary | ICD-10-CM | POA: Diagnosis not present

## 2016-07-07 DIAGNOSIS — M940 Chondrocostal junction syndrome [Tietze]: Secondary | ICD-10-CM | POA: Diagnosis not present

## 2016-07-07 DIAGNOSIS — G5702 Lesion of sciatic nerve, left lower limb: Secondary | ICD-10-CM | POA: Diagnosis not present

## 2016-07-07 MED ORDER — PREDNISONE 50 MG PO TABS: 50.0000 mg | ORAL_TABLET | Freq: Every day | ORAL | 0 refills | Status: DC

## 2016-07-07 MED ORDER — GABAPENTIN 100 MG PO CAPS: 200.0000 mg | ORAL_CAPSULE | Freq: Every day | ORAL | 3 refills | Status: DC

## 2016-07-07 MED FILL — GABAPENTIN 100 MG CAPSULE: 100 | 30 days supply | Qty: 60 | Fill #0

## 2016-07-07 MED FILL — predniSONE 50 MG TABS: 50 | 5 days supply | Qty: 5 | Fill #0

## 2016-07-07 NOTE — Assessment & Plan Note (Signed)
I believe the patient's pain is more secondary to a piriformis interleaving coccyx area at this time. Patient has responded well to osteopathic manipulative the past. Still having pinpoint tenderness in the area. At this time patient was responding fairly well to the manipulation but we did start her on prednisone as well as gabapentin at night. I'm hoping that this will be beneficial. Discussed following up again in 2-3 weeks. At that time if worsening symptoms we'll consider possible injection for diagnostic as well as therapeutic purposes. Otherwise advance imaging could be warranted for the lumbar spine.

## 2016-07-07 NOTE — Assessment & Plan Note (Signed)
Decision today to treat with OMT was based on Physical Exam  After verbal consent patient was treated with HVLA, ME, FPR techniques in cervical, thoracic, lumbar and rib areas  Patient tolerated the procedure well with improvement in symptoms  Patient given exercises, stretches and lifestyle modifications  See medications in patient instructions if given  Patient will follow up in 2-3 weeks

## 2016-07-07 NOTE — Assessment & Plan Note (Signed)
Stable, continue current therapy

## 2016-07-07 NOTE — Patient Instructions (Addendum)
Sory you are not better Prednisone for 5 days.  Keep doing the ice and consider a donut to sit on.  Gabapentin 200mg  at night See me again in 2 weeks and if not better we will consider injection.

## 2016-07-09 ENCOUNTER — Telehealth: Payer: Self-pay

## 2016-07-09 ENCOUNTER — Ambulatory Visit (INDEPENDENT_AMBULATORY_CARE_PROVIDER_SITE_OTHER): Payer: BLUE CROSS/BLUE SHIELD

## 2016-07-09 DIAGNOSIS — Z23 Encounter for immunization: Secondary | ICD-10-CM | POA: Diagnosis not present

## 2016-07-09 NOTE — Telephone Encounter (Signed)
Left message for patient in regards to the shooting pain in her neck that she stopped by the office to inquire about. Told her Dr. Tamala Julian recommended that she continue with Gabapentin and to call if her symptoms worsen.

## 2016-08-14 ENCOUNTER — Encounter (INDEPENDENT_AMBULATORY_CARE_PROVIDER_SITE_OTHER): Payer: BLUE CROSS/BLUE SHIELD | Admitting: Family Medicine

## 2016-08-15 ENCOUNTER — Encounter: Payer: Self-pay | Admitting: Internal Medicine

## 2016-08-15 ENCOUNTER — Ambulatory Visit (INDEPENDENT_AMBULATORY_CARE_PROVIDER_SITE_OTHER): Payer: BLUE CROSS/BLUE SHIELD | Admitting: Internal Medicine

## 2016-08-15 DIAGNOSIS — F410 Panic disorder [episodic paroxysmal anxiety] without agoraphobia: Secondary | ICD-10-CM

## 2016-08-15 DIAGNOSIS — J4520 Mild intermittent asthma, uncomplicated: Secondary | ICD-10-CM

## 2016-08-15 DIAGNOSIS — E669 Obesity, unspecified: Secondary | ICD-10-CM

## 2016-08-15 MED ORDER — ALBUTEROL SULFATE 108 (90 BASE) MCG/ACT IN AEPB: 1.0000 | INHALATION_SPRAY | Freq: Four times a day (QID) | RESPIRATORY_TRACT | 11 refills | Status: DC | PRN

## 2016-08-15 MED ORDER — BUSPIRONE HCL 5 MG PO TABS: 5.0000 mg | ORAL_TABLET | Freq: Two times a day (BID) | ORAL | 5 refills | Status: DC

## 2016-08-15 MED FILL — busPIRone HCL 5 MG TABS: 5 | 30 days supply | Qty: 60 | Fill #0

## 2016-08-15 MED FILL — PROAIR RESPICLICK INHAL PWD: 108 (90 BAS | 30 days supply | Qty: 1 | Fill #0
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
## 2016-08-15 NOTE — Progress Notes (Signed)
Pre visit review using our clinic review tool, if applicable. No additional management support is needed unless otherwise documented below in the visit note. 

## 2016-08-15 NOTE — Assessment & Plan Note (Addendum)
Buspar Behav med ref [employer]

## 2016-08-15 NOTE — Assessment & Plan Note (Signed)
Restart Qvar

## 2016-08-15 NOTE — Assessment & Plan Note (Signed)
Dr.Beasley 

## 2016-08-18 ENCOUNTER — Other Ambulatory Visit (INDEPENDENT_AMBULATORY_CARE_PROVIDER_SITE_OTHER): Payer: BLUE CROSS/BLUE SHIELD

## 2016-08-18 DIAGNOSIS — E669 Obesity, unspecified: Secondary | ICD-10-CM

## 2016-08-18 DIAGNOSIS — F410 Panic disorder [episodic paroxysmal anxiety] without agoraphobia: Secondary | ICD-10-CM

## 2016-08-18 LAB — CBC WITH DIFFERENTIAL/PLATELET
BASOS PCT: 0.5 % (ref 0.0–3.0)
Basophils Absolute: 0 10*3/uL (ref 0.0–0.1)
EOS PCT: 10.1 % — AB (ref 0.0–5.0)
Eosinophils Absolute: 0.9 10*3/uL — ABNORMAL HIGH (ref 0.0–0.7)
HCT: 33.6 % — ABNORMAL LOW (ref 36.0–46.0)
HEMOGLOBIN: 11.2 g/dL — AB (ref 12.0–15.0)
LYMPHS ABS: 2.3 10*3/uL (ref 0.7–4.0)
Lymphocytes Relative: 26.4 % (ref 12.0–46.0)
MCHC: 33.2 g/dL (ref 30.0–36.0)
MCV: 77.4 fl — AB (ref 78.0–100.0)
MONO ABS: 0.5 10*3/uL (ref 0.1–1.0)
MONOS PCT: 5.8 % (ref 3.0–12.0)
NEUTROS PCT: 57.2 % (ref 43.0–77.0)
Neutro Abs: 4.9 10*3/uL (ref 1.4–7.7)
Platelets: 250 10*3/uL (ref 150.0–400.0)
RBC: 4.35 Mil/uL (ref 3.87–5.11)
RDW: 15 % (ref 11.5–15.5)
WBC: 8.6 10*3/uL (ref 4.0–10.5)

## 2016-08-18 LAB — LIPID PANEL
CHOLESTEROL: 169 mg/dL (ref 0–200)
HDL: 48.2 mg/dL (ref >39.00)
LDL Cholesterol: 91 mg/dL (ref 0–99)
NONHDL: 121.25
Total CHOL/HDL Ratio: 4
Triglycerides: 149 mg/dL (ref 0.0–149.0)
VLDL: 29.8 mg/dL (ref 0.0–40.0)

## 2016-08-18 LAB — HEPATIC FUNCTION PANEL
ALBUMIN: 3.9 g/dL (ref 3.5–5.2)
ALK PHOS: 56 U/L (ref 39–117)
ALT: 13 U/L (ref 0–35)
AST: 17 U/L (ref 0–37)
Bilirubin, Direct: 0.1 mg/dL (ref 0.0–0.3)
TOTAL PROTEIN: 7.5 g/dL (ref 6.0–8.3)
Total Bilirubin: 0.4 mg/dL (ref 0.2–1.2)

## 2016-08-18 LAB — TSH: TSH: 2.33 u[IU]/mL (ref 0.35–4.50)

## 2016-08-18 LAB — URINALYSIS, ROUTINE W REFLEX MICROSCOPIC
BILIRUBIN URINE: NEGATIVE
KETONES UR: NEGATIVE
NITRITE: NEGATIVE
PH: 6 (ref 5.0–8.0)
Specific Gravity, Urine: 1.02 (ref 1.000–1.030)
TOTAL PROTEIN, URINE-UPE24: NEGATIVE
URINE GLUCOSE: NEGATIVE
UROBILINOGEN UA: 0.2 (ref 0.0–1.0)

## 2016-08-18 LAB — BASIC METABOLIC PANEL
BUN: 14 mg/dL (ref 6–23)
CHLORIDE: 103 meq/L (ref 96–112)
CO2: 26 mEq/L (ref 19–32)
Calcium: 9.1 mg/dL (ref 8.4–10.5)
Creatinine, Ser: 0.86 mg/dL (ref 0.40–1.20)
GFR: 96.85 mL/min (ref >60.00)
GLUCOSE: 87 mg/dL (ref 70–99)
POTASSIUM: 3.9 meq/L (ref 3.5–5.1)
SODIUM: 136 meq/L (ref 135–145)

## 2016-08-18 LAB — VITAMIN B12: VITAMIN B 12: 134 pg/mL — AB (ref 211–911)

## 2016-08-22 ENCOUNTER — Ambulatory Visit (INDEPENDENT_AMBULATORY_CARE_PROVIDER_SITE_OTHER): Payer: BLUE CROSS/BLUE SHIELD | Admitting: Geriatric Medicine

## 2016-08-22 ENCOUNTER — Telehealth: Payer: Self-pay | Admitting: Internal Medicine

## 2016-08-22 ENCOUNTER — Ambulatory Visit: Payer: BLUE CROSS/BLUE SHIELD | Admitting: Internal Medicine

## 2016-08-22 DIAGNOSIS — E538 Deficiency of other specified B group vitamins: Secondary | ICD-10-CM

## 2016-08-22 MED ORDER — CYANOCOBALAMIN 1000 MCG/ML IJ SOLN
1000.0000 ug | Freq: Once | INTRAMUSCULAR | Status: AC
  Administered 2016-08-22: 1000 ug via INTRAMUSCULAR

## 2016-08-22 NOTE — Telephone Encounter (Signed)
Patient was told to get B12 injections again.  Please let us know if she needs to do weekly then move to monthly or just do monthly.

## 2016-08-23 NOTE — Telephone Encounter (Signed)
B12 injections: she needs to do weekly then move to monthly

## 2016-08-25 NOTE — Telephone Encounter (Signed)
Called pt no answer LMOM w/MD response../lmb 

## 2016-08-26 ENCOUNTER — Ambulatory Visit (INDEPENDENT_AMBULATORY_CARE_PROVIDER_SITE_OTHER): Payer: BLUE CROSS/BLUE SHIELD | Admitting: Family Medicine

## 2016-08-26 ENCOUNTER — Encounter (INDEPENDENT_AMBULATORY_CARE_PROVIDER_SITE_OTHER): Payer: Self-pay | Admitting: Family Medicine

## 2016-08-26 VITALS — BP 116/68 | HR 90 | Temp 98.3°F | Resp 12 | Ht 63.0 in | Wt 219.4 lb

## 2016-08-26 DIAGNOSIS — F411 Generalized anxiety disorder: Secondary | ICD-10-CM

## 2016-08-26 DIAGNOSIS — Z1331 Encounter for screening for depression: Secondary | ICD-10-CM

## 2016-08-26 DIAGNOSIS — Z1389 Encounter for screening for other disorder: Secondary | ICD-10-CM | POA: Diagnosis not present

## 2016-08-26 DIAGNOSIS — D508 Other iron deficiency anemias: Secondary | ICD-10-CM

## 2016-08-26 DIAGNOSIS — Z9189 Other specified personal risk factors, not elsewhere classified: Secondary | ICD-10-CM | POA: Diagnosis not present

## 2016-08-26 DIAGNOSIS — R0609 Other forms of dyspnea: Secondary | ICD-10-CM

## 2016-08-26 DIAGNOSIS — E559 Vitamin D deficiency, unspecified: Secondary | ICD-10-CM | POA: Diagnosis not present

## 2016-08-26 DIAGNOSIS — R0600 Dyspnea, unspecified: Secondary | ICD-10-CM

## 2016-08-26 DIAGNOSIS — E538 Deficiency of other specified B group vitamins: Secondary | ICD-10-CM | POA: Diagnosis not present

## 2016-08-26 DIAGNOSIS — Z0289 Encounter for other administrative examinations: Secondary | ICD-10-CM

## 2016-08-26 DIAGNOSIS — R5383 Other fatigue: Secondary | ICD-10-CM | POA: Diagnosis not present

## 2016-08-26 MED ORDER — ESCITALOPRAM OXALATE 10 MG PO TABS: 10.0000 mg | ORAL_TABLET | ORAL | 0 refills | Status: DC

## 2016-08-26 MED ORDER — ESCITALOPRAM OXALATE 10 MG PO TABS: 10.0000 mg | ORAL_TABLET | Freq: Every day | ORAL | 0 refills | Status: DC

## 2016-08-26 MED FILL — ESCITALOPRAM 10 MG TABLET: 10 | 30 days supply | Qty: 30 | Fill #0

## 2016-08-26 NOTE — Progress Notes (Signed)
Office: [phone]  /  Fax: [phone]   HPI:   Chief Complaint: OBESITY  Deanna Schwartz (MR# [id number]) is a 35 y.o. female who presents on 08/26/2016 for obesity evaluation and treatment. Current BMI is Body mass index is 38.86 kg/m.Deanna Schwartz has struggled with obesity for years and has been unsuccessful in either losing weight or maintaining long term weight loss. Deanna Schwartz attended our information session and states she is currently in the action stage of change and ready to dedicate time achieving and maintaining a healthier weight.  Deanna Schwartz states her family eats meals together she thinks her family will eat healthier with  her she has significant food cravings issues  she has problems with excessive hunger  she struggles with emotional eating    Fatigue Deanna Schwartz feels her energy is lower than it should be. This has worsened with weight gain and has not worsened recently. Deanna Schwartz admits to daytime somnolence and  reports waking up still tired. Patient is at risk for obstructive sleep apnea. Patent has a history of symptoms of morning fatigue. Patient generally gets 5 or 6 hours of sleep per night, and states they generally have difficulty falling back asleep if awakened. Snoring is present. Apneic episodes are not present. Epworth Sleepiness Score is 7  Dyspnea on exertion Deanna Schwartz notes increasing shortness of breath with exercising and seems to be worsening over time with weight gain. She notes getting out of breath sooner with activity than she used to. This has not gotten worse recently.  Decrease Vit D Deanna Schwartz has a diagnosis of vitamin D deficiency. She is not currently taking vit D and denies nausea, vomiting or muscle weakness.  Anemia She has a long history of anemia and a recent diagnosis of B12 deficiency. Her anemia does not appear to be related to B12. + fatihue. She doesn't eat much B12 rich foods.  Anxiety She has a history of anxiety. She was  prescribed Buspar by her PCP but was afraid to take it. She was referred to psych, but unsure if she wants to go. She is afraid to drive anymore and has a high level of anxiety about multiple things.  Depression Screen [place]'s Food and Mood (modified PHQ-9) score was  Depression screen PHQ 2/9 08/26/2016  Decreased Interest 3  Down, Depressed, Hopeless 1  PHQ - 2 Score 4  Altered sleeping 1  Tired, decreased energy 3  Change in appetite 2  Feeling bad or failure about yourself  0  Trouble concentrating 0  Moving slowly or fidgety/restless 0  Suicidal thoughts 0  PHQ-9 Score 10  Some recent data might be hidden    ALLERGIES: Allergies  Allergen Reactions  . Augmentin [Amoxicillin-Pot Clavulanate] Rash  . Cephalexin Rash  . Iodine Swelling and Rash    Throat swelling after eating shrimp    MEDICATIONS: Current Outpatient Prescriptions on File Prior to Visit  Medication Sig Dispense Refill  . Albuterol Sulfate (PROAIR RESPICLICK) 123XX123 (90 Base) MCG/ACT AEPB Inhale 1-2 puffs into the lungs 4 (four) times daily as needed. 1 each 11  . beclomethasone (QVAR) 40 MCG/ACT inhaler Inhale 2 puffs into the lungs 2 (two) times daily. 1 Inhaler 12  . esomeprazole (NEXIUM) 40 MG capsule Take 40 mg by mouth daily at 12 noon.    . fluticasone (FLONASE) 50 MCG/ACT nasal spray Place 2 sprays into both nostrils daily. 16 g 6  . Ibuprofen-Famotidine 800-26.6 MG TABS Take 1 tablet by mouth 3 (three) times daily as needed. 90 tablet 3  No current facility-administered medications on file prior to visit.     PAST MEDICAL HISTORY: Past Medical History:  Diagnosis Date  . Allergic rhinitis   . Anemia   . Anxiety   . Asthma   . 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 Chronic constipation   . Constipation   . Depression   . GERD (gastroesophageal reflux disease)   . H. pylori infection 2008   Hx of tx + serology  . Iron deficiency anemia 08/06/2011    Hgb 10.2 and MCV 79 on health screen labs   . Irritable bowel syndrome   . Obesity   . Palpitations   . Panic attack   . Shortness of breath   . Ulcer ([place])     PAST SURGICAL HISTORY: Past Surgical History:  Procedure Laterality Date  . FLEXIBLE SIGMOIDOSCOPY  04/14/2008   normal  . NASAL SINUS SURGERY    . UPPER GASTROINTESTINAL ENDOSCOPY      SOCIAL HISTORY: Social History  Substance Use Topics  . Smoking status: Never Smoker  . Smokeless tobacco: Never Used  . Alcohol use 0.0 oz/week     Comment: Social maybe twice amonth, beer    FAMILY HISTORY: Family History  Problem Relation Age of Onset  . Diabetes      grandmother  . Hypertension    . Hypertension Mother   . Anxiety disorder Mother   . Obesity Mother   . Hypertension Father   . Diabetes Maternal Aunt   . Diabetes Maternal Aunt   . Diabetes Paternal Grandmother   . Colon polyps Paternal Uncle     x4  . Colon cancer Neg Hx     ROS: Review of Systems  Constitutional: Positive for malaise/fatigue.  Eyes:       Wear glasses or contacts  Musculoskeletal: Positive for joint pain.  Skin:       Dryness  Neurological: Positive for dizziness and weakness.  Psychiatric/Behavioral: The patient is nervous/anxious.        Stress    PHYSICAL EXAM: Blood pressure 116/68, pulse 90, temperature 98.3 F (36.8 C), temperature source Oral, resp. rate 12, height 5\' 3"  (1.6 m), weight 219 lb 6.4 oz (99.5 kg). Body mass index is 38.86 kg/m. Physical Exam  Constitutional: She is oriented to person, place, and time. She appears well-developed and well-nourished.  HENT:  Head: Normocephalic and atraumatic.  Nose: Nose normal.  Eyes: EOM are normal. No scleral icterus.  Neck: Normal range of motion. Neck supple. No thyromegaly present.  Cardiovascular: Normal rate and regular rhythm.   Pulmonary/Chest: Effort normal and breath sounds normal.  Abdominal: Soft. There is no tenderness.  Musculoskeletal: Normal range  of motion. She exhibits edema.  Neurological: She is alert and oriented to person, place, and time. Coordination normal.  Skin: Skin is warm and dry.  Psychiatric: She has a normal mood and affect. Her behavior is normal.  Vitals reviewed.   RECENT LABS AND TESTS: BMET    Component Value Date/Time   NA 136 08/18/2016 0804   K 3.9 08/18/2016 0804   CL 103 08/18/2016 0804   CO2 26 08/18/2016 0804   GLUCOSE 87 08/18/2016 0804   GLUCOSE 97 07/27/2006 1219   BUN 14 08/18/2016 0804   CREATININE 0.86 08/18/2016 0804   CREATININE 0.74 05/13/2013 1024   CALCIUM 9.1 08/18/2016 0804   GFRNONAA >90 01/03/2014 1916   GFRAA >90 01/03/2014 1916   Lab Results  Component Value Date   HGBA1C 5.5 10/10/2015   No results found for: INSULIN CBC    Component Value Date/Time   WBC 8.6 08/18/2016 0804   RBC 4.35 08/18/2016 0804   HGB 11.2 (L) 08/18/2016 0804   HCT 33.6 (L) 08/18/2016 0804   PLT 250.0 08/18/2016 0804   MCV 77.4 (L) 08/18/2016 0804   MCH 25.0 (L) 01/03/2014 1916   MCHC 33.2 08/18/2016 0804   RDW 15.0 08/18/2016 0804   LYMPHSABS 2.3 08/18/2016 0804   MONOABS 0.5 08/18/2016 0804   EOSABS 0.9 (H) 08/18/2016 0804   BASOSABS 0.0 08/18/2016 0804   Iron/TIBC/Ferritin/ %Sat    Component Value Date/Time   IRON 53 08/27/2015 0829   TIBC 430 03/29/2015 0950   FERRITIN 6.6 (L) 03/14/2016 1150   IRONPCTSAT 11.4 (L) 08/27/2015 0829   IRONPCTSAT 11 (L) 03/29/2015 0950   Lipid Panel     Component Value Date/Time   CHOL 169 08/18/2016 0804   TRIG 149.0 08/18/2016 0804   HDL 48.20 08/18/2016 0804   CHOLHDL 4 08/18/2016 0804   VLDL 29.8 08/18/2016 0804   LDLCALC 91 08/18/2016 0804   Hepatic Function Panel     Component Value Date/Time   PROT 7.5 08/18/2016 0804   ALBUMIN 3.9 08/18/2016 0804   AST 17 08/18/2016 0804   ALT 13 08/18/2016 0804   ALKPHOS 56 08/18/2016 0804   BILITOT 0.4 08/18/2016 0804   BILIDIR 0.1 08/18/2016 0804      Component Value Date/Time   TSH  2.33 08/18/2016 0804   TSH 2.04 08/27/2015 0829   TSH 1.29 06/22/2015 1653    ECG done today shows NSR @ 78 BPM INDIRECT CALORIMETER done today shows a VO2 of 222 and a REE of 1584.    ASSESSMENT AND PLAN: Other fatigue - Plan: EKG 12-Lead, Comprehensive metabolic panel, Lipid Panel With LDL/HDL Ratio, VITAMIN D 25 Hydroxy (Vit-D Deficiency, Fractures), Folate, T3, T4, free, TSH, Anemia panel  Dyspnea on exertion  Vitamin D deficiency  Generalized anxiety disorder - Plan: escitalopram (Deanna Schwartz) 10 MG tablet  At risk of diabetes mellitus - Plan: Insulin, random  Depression screening  Other iron deficiency anemia - Plan: CBC With Differential, Hemoglobin A1c, Anemia panel  PLAN:  Fatigue Deanna Schwartz was informed that her fatigue may be related to obesity, depression or many other causes. Labs will be ordered, and in the meanwhile Deanna Schwartz has agreed to work on diet, exercise and weight loss to help with fatigue. Proper sleep hygiene was discussed including the need for 7-8 hours of quality sleep each night. A sleep study was not ordered based on symptoms and Epworth score.  Exercise Induced Shortness of Breath Starkeisha's shortness of breath appears to be obesity related and exercise induced. She has agreed to work on weight loss and gradually increase exercise to treat her exercise induced shortness of breath. If Deanna Schwartz follows our instructions and loses weight without improvement of her shortness of breath, we will plan to refer to pulmonology. We will monitor this condition regularly. Deanna Schwartz agrees to this plan.  Vitamin D Deficiency Deanna Schwartz was informed that low vitamin D levels contributes to fatigue and are associated with obesity, Check labs and follow.  B12 deficiency She has a hx of B12 deficiency but no recent labs, will check labs and follow.  Anemia She has a long hx of anemia which apperad to be iron deficient. Will check an anemia panel and follow with  dietary advice and possibly iron supplement.  GAD We will  d/c buspar for now as she is hesitant to take this. We discussed many options and she agreed to start Deanna Schwartz 10mg  qam as it can also help with weight loss. We will continue to monitor, but will likely also encourage her to see psychology.  Depression Screen Deanna Schwartz had a positive depression screening. Depression is commonly associated with obesity and often results in emotional eating behaviors. We will monitor this closely and work on CBT to help improve the non-hunger eating patterns. Referral to Psychology may be required if no improvement is seen as she continues in our clinic.  Diabetes prevention counselling Deanna Schwartz was given extended (at least 15 minutes) diabetes prevention counseling today. She is 35 y.o. female and has risk factors for diabetes including obesity. We discussed intensive lifestyle modifications today with an emphasis on weight loss as well as increasing exercise and decreasing simple carbohydrates in her diet.   Obesity Deanna Schwartz is currently in the action stage of change and her goal is to continue with weight loss efforts She has agreed to follow the Category 2 plan Deanna Schwartz has been instructed to work up to a goal of 150 minutes of combined cardio and strengthening exercise per week for weight loss and overall health benefits. We discussed the following Behavioral Modification Stratagies today: increasing lean protein intake and work on meal planning and easy cooking plans  Deanna Schwartz has agreed to follow up with our clinic in 2 weeks. She was informed of the importance of frequent follow up visits to maximize her success with intensive lifestyle modifications for her multiple health conditions. She was informed we would discuss her lab results at her next visit unless there is a critical issue that needs to be addressed sooner. Deanna Schwartz agreed to keep her next visit at the agreed upon time to discuss these  results.

## 2016-08-27 LAB — CBC WITH DIFFERENTIAL
BASOS ABS: 0 10*3/uL (ref 0.0–0.2)
Basos: 1 %
EOS (ABSOLUTE): 0.9 10*3/uL — ABNORMAL HIGH (ref 0.0–0.4)
Eos: 15 %
HEMOGLOBIN: 10.8 g/dL — AB (ref 11.1–15.9)
Immature Grans (Abs): 0 10*3/uL (ref 0.0–0.1)
Immature Granulocytes: 0 %
Lymphocytes Absolute: 1.9 10*3/uL (ref 0.7–3.1)
Lymphs: 30 %
MCH: 25.5 pg — ABNORMAL LOW (ref 26.6–33.0)
MCHC: 32.4 g/dL (ref 31.5–35.7)
MCV: 79 fL (ref 79–97)
MONOCYTES: 5 %
Monocytes Absolute: 0.3 10*3/uL (ref 0.1–0.9)
NEUTROS ABS: 3 10*3/uL (ref 1.4–7.0)
Neutrophils: 49 %
RBC: 4.23 x10E6/uL (ref 3.77–5.28)
RDW: 15.1 % (ref 12.3–15.4)
WBC: 6.2 10*3/uL (ref 3.4–10.8)

## 2016-08-27 LAB — ANEMIA PANEL
FERRITIN: 18 ng/mL (ref 15–150)
FOLATE, RBC: 1225 ng/mL (ref >498)
Folate, Hemolysate: 408 ng/mL
Hematocrit: 33.3 % — ABNORMAL LOW (ref 34.0–46.6)
Iron Saturation: 15 % (ref 15–55)
Iron: 61 ug/dL (ref 27–159)
RETIC CT PCT: 1 % (ref 0.6–2.6)
TIBC: 415 ug/dL (ref 250–450)
UIBC: 354 ug/dL (ref 131–425)
Vitamin B-12: 603 pg/mL (ref 232–1245)

## 2016-08-27 LAB — LIPID PANEL WITH LDL/HDL RATIO
CHOLESTEROL TOTAL: 184 mg/dL (ref 100–199)
HDL: 52 mg/dL (ref >39)
LDL CALC: 106 mg/dL — AB (ref 0–99)
LDl/HDL Ratio: 2 ratio units (ref 0.0–3.2)
TRIGLYCERIDES: 131 mg/dL (ref 0–149)
VLDL Cholesterol Cal: 26 mg/dL (ref 5–40)

## 2016-08-27 LAB — COMPREHENSIVE METABOLIC PANEL
ALK PHOS: 72 IU/L (ref 39–117)
ALT: 16 IU/L (ref 0–32)
AST: 22 IU/L (ref 0–40)
Albumin/Globulin Ratio: 1.1 — ABNORMAL LOW (ref 1.2–2.2)
Albumin: 4 g/dL (ref 3.5–5.5)
BUN/Creatinine Ratio: 18 (ref 9–23)
BUN: 13 mg/dL (ref 6–20)
Bilirubin Total: 0.3 mg/dL (ref 0.0–1.2)
CO2: 26 mmol/L (ref 18–29)
CREATININE: 0.73 mg/dL (ref 0.57–1.00)
Calcium: 9.7 mg/dL (ref 8.7–10.2)
Chloride: 100 mmol/L (ref 96–106)
GFR calc Af Amer: 124 mL/min/{1.73_m2} (ref >59)
GFR calc non Af Amer: 108 mL/min/{1.73_m2} (ref >59)
GLOBULIN, TOTAL: 3.7 g/dL (ref 1.5–4.5)
GLUCOSE: 86 mg/dL (ref 65–99)
Potassium: 4.3 mmol/L (ref 3.5–5.2)
SODIUM: 141 mmol/L (ref 134–144)
Total Protein: 7.7 g/dL (ref 6.0–8.5)

## 2016-08-27 LAB — INSULIN, RANDOM: INSULIN: 13 u[IU]/mL (ref 2.6–24.9)

## 2016-08-27 LAB — TSH: TSH: 1.24 u[IU]/mL (ref 0.450–4.500)

## 2016-08-27 LAB — VITAMIN D 25 HYDROXY (VIT D DEFICIENCY, FRACTURES): Vit D, 25-Hydroxy: 27.6 ng/mL — ABNORMAL LOW (ref 30.0–100.0)

## 2016-08-27 LAB — T4, FREE: FREE T4: 1.26 ng/dL (ref 0.82–1.77)

## 2016-08-27 LAB — T3: T3 TOTAL: 139 ng/dL (ref 71–180)

## 2016-08-27 LAB — HEMOGLOBIN A1C
ESTIMATED AVERAGE GLUCOSE: 105 mg/dL
HEMOGLOBIN A1C: 5.3 % (ref 4.8–5.6)

## 2016-08-27 LAB — FOLATE: Folate: 15.6 ng/mL (ref >3.0)

## 2016-09-16 ENCOUNTER — Ambulatory Visit (INDEPENDENT_AMBULATORY_CARE_PROVIDER_SITE_OTHER): Payer: BLUE CROSS/BLUE SHIELD | Admitting: Family Medicine

## 2016-09-16 VITALS — BP 118/72 | HR 82 | Temp 98.5°F | Ht 63.0 in | Wt 222.0 lb

## 2016-09-16 DIAGNOSIS — E559 Vitamin D deficiency, unspecified: Secondary | ICD-10-CM

## 2016-09-16 DIAGNOSIS — E8881 Metabolic syndrome: Secondary | ICD-10-CM | POA: Diagnosis not present

## 2016-09-16 DIAGNOSIS — E669 Obesity, unspecified: Secondary | ICD-10-CM

## 2016-09-16 DIAGNOSIS — G4700 Insomnia, unspecified: Secondary | ICD-10-CM

## 2016-09-16 DIAGNOSIS — Z6839 Body mass index (BMI) 39.0-39.9, adult: Secondary | ICD-10-CM

## 2016-09-16 DIAGNOSIS — Z9189 Other specified personal risk factors, not elsewhere classified: Secondary | ICD-10-CM

## 2016-09-16 MED ORDER — METFORMIN HCL 500 MG PO TABS: 500.0000 mg | ORAL_TABLET | Freq: Every day | ORAL | 0 refills | Status: DC

## 2016-09-16 MED ORDER — MELATONIN 5 MG PO TABS: 5.0000 mg | ORAL_TABLET | Freq: Once | ORAL | 0 refills | Status: AC

## 2016-09-16 MED ORDER — VITAMIN D (ERGOCALCIFEROL) 1.25 MG (50000 UNIT) PO CAPS: 50000.0000 [IU] | ORAL_CAPSULE | ORAL | 0 refills | Status: DC

## 2016-09-16 MED FILL — metFORMIN HCL 500 MG TABS: 500 | 30 days supply | Qty: 30 | Fill #0

## 2016-09-16 MED FILL — VIT D2 1.25 MG (50,000 UNIT: 1.25 MG | 28 days supply | Qty: 4 | Fill #0

## 2016-09-16 NOTE — Progress Notes (Signed)
Office: [phone]  /  Fax: [phone]   HPI:   Chief Complaint: OBESITY Blaire is here to discuss her progress with her obesity treatment plan. She is following her eating plan approximately 25 % of the time and states she is walking 10,000 steps every day. Tyyne is currently struggling with increased temptations. States she was unable to start early plan over holiday's with increased temptations and sabotage. Tried portion [employer] instead.  Ready to get back on track. Her weight is 222 lb (100.7 kg) today and has gained 3 lbs since her last visit. She has gained 3 lbs since starting treatment with [place].  Vitamin D deficiency Kashish has a new diagnosis of vitamin D deficiency. Not at goal. She is not currently taking vit D and denies nausea, vomiting or muscle weakness. Positive fatigue.  Insulin Resistance Hyatt has a new diagnosis of insulin resistance based on her elevated insulin level of 13.0. Fasting blood glucose and A1c are normal. Adilene reports polyphagia, worse in evenings. Although Channing's blood glucose readings are still under good control, insulin resistance puts her at greater risk of metabolic syndrome and diabetes. She is not taking metformin currently and continues to work on diet and exercise to decrease risk of diabetes.  Insomnia Tasha reports frequent middle of the night awakenings, happens most nights, has difficulty falling back to sleep.  At risk for diabetes Anuva is at higher than average risk for developing diabetes due to her obesity. She currently denies polyuria or polydipsia.   Wt Readings from Last 500 Encounters:  09/16/16 222 lb (100.7 kg)  08/26/16 219 lb 6.4 oz (99.5 kg)  08/15/16 225 lb (102.1 kg)  07/07/16 226 lb (102.5 kg)  06/10/16 224 lb (101.6 kg)  05/30/16 222 lb (100.7 kg)  03/14/16 221 lb (100.2 kg)  02/27/16 224 lb (101.6 kg)  11/22/15 223 lb (101.2 kg)  10/10/15 225 lb (102.1 kg)    08/27/15 224 lb (101.6 kg)  08/17/15 221 lb (100.2 kg)  06/22/15 221 lb (100.2 kg)  05/30/15 220 lb (99.8 kg)  04/18/15 221 lb (100.2 kg)  02/21/15 221 lb (100.2 kg)  02/02/15 217 lb (98.4 kg)  01/22/15 217 lb (98.4 kg)  01/03/15 215 lb (97.5 kg)  11/13/14 217 lb 8 oz (98.7 kg)  10/04/14 212 lb 4 oz (96.3 kg)  09/22/14 214 lb 6 oz (97.2 kg)  09/14/14 212 lb 9.6 oz (96.4 kg)  07/12/14 215 lb (97.5 kg)  06/20/14 213 lb (96.6 kg)  05/09/14 214 lb (97.1 kg)  04/07/14 214 lb (97.1 kg)  02/15/14 212 lb 9.6 oz (96.4 kg)  01/25/14 212 lb (96.2 kg)  11/19/13 211 lb (95.7 kg)  11/18/13 213 lb (96.6 kg)  11/05/13 211 lb (95.7 kg)  06/30/13 206 lb (93.4 kg)  05/26/13 200 lb 12.8 oz (91.1 kg)  05/13/13 200 lb (90.7 kg)  11/13/12 203 lb (92.1 kg)  10/05/12 201 lb 12.8 oz (91.5 kg)  04/29/12 191 lb 9.6 oz (86.9 kg)  04/21/12 197 lb (89.4 kg)  01/19/12 188 lb (85.3 kg)  01/13/12 188 lb (85.3 kg)  01/13/12 188 lb (85.3 kg)  01/09/12 190 lb (86.2 kg)  12/29/11 190 lb 9.6 oz (86.5 kg)  08/28/11 193 lb (87.5 kg)  03/24/11 201 lb (91.2 kg)  02/27/11 196 lb (88.9 kg)  10/11/10 193 lb (87.5 kg)  09/04/10 194 lb (88 kg)  07/10/10 197 lb (89.4 kg)  06/22/09 184 lb (83.5 kg)  05/07/09 178 lb 12.8  oz (81.1 kg)  03/27/09 175 lb (79.4 kg)  03/09/09 176 lb 3.2 oz (79.9 kg)  11/01/08 183 lb (83 kg)  08/07/08 186 lb (84.4 kg)  05/26/08 194 lb (88 kg)  05/03/08 195 lb (88.5 kg)  04/07/08 194 lb 2.1 oz (88.1 kg)  03/29/08 197 lb (89.4 kg)  03/03/08 201 lb (91.2 kg)  01/11/08 201 lb (91.2 kg)  11/25/07 208 lb (94.3 kg)  10/20/07 202 lb (91.6 kg)  01/01/07 182 lb (82.6 kg)     ALLERGIES: Allergies  Allergen Reactions  . Augmentin [Amoxicillin-Pot Clavulanate] Rash  . Cephalexin Rash  . Iodine Swelling and Rash    Throat swelling after eating shrimp    MEDICATIONS: Current Outpatient Prescriptions on File Prior to Visit  Medication Sig Dispense Refill  . Albuterol Sulfate (PROAIR  RESPICLICK) 123XX123 (90 Base) MCG/ACT AEPB Inhale 1-2 puffs into the lungs 4 (four) times daily as needed. 1 each 11  . beclomethasone (QVAR) 40 MCG/ACT inhaler Inhale 2 puffs into the lungs 2 (two) times daily. 1 Inhaler 12  . escitalopram (LEXAPRO) 10 MG tablet Take 1 tablet (10 mg total) by mouth daily. 30 tablet 0  . esomeprazole (NEXIUM) 40 MG capsule Take 40 mg by mouth daily at 12 noon.    . fluticasone (FLONASE) 50 MCG/ACT nasal spray Place 2 sprays into both nostrils daily. 16 g 6   No current facility-administered medications on file prior to visit.     PAST MEDICAL HISTORY: Past Medical History:  Diagnosis Date  . Allergic rhinitis   . Anemia   . Anxiety   . Asthma   . 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 Chronic constipation   . Constipation   . Depression   . GERD (gastroesophageal reflux disease)   . H. pylori infection 2008   Hx of tx + serology  . Iron deficiency anemia 08/06/2011   Hgb 10.2 and MCV 79 on health screen labs   . Irritable bowel syndrome   . Obesity   . Palpitations   . Panic attack   . Shortness of breath   . Ulcer ([place])     PAST SURGICAL HISTORY: Past Surgical History:  Procedure Laterality Date  . FLEXIBLE SIGMOIDOSCOPY  04/14/2008   normal  . NASAL SINUS SURGERY    . UPPER GASTROINTESTINAL ENDOSCOPY      SOCIAL HISTORY: Social History  Substance Use Topics  . Smoking status: Never Smoker  . Smokeless tobacco: Never Used  . Alcohol use 0.0 oz/week     Comment: Social maybe twice amonth, beer    FAMILY HISTORY: Family History  Problem Relation Age of Onset  . Diabetes      grandmother  . Hypertension    . Hypertension Mother   . Anxiety disorder Mother   . Obesity Mother   . Hypertension Father   . Diabetes Maternal Aunt   . Diabetes Maternal Aunt   . Diabetes Paternal Grandmother   . Colon polyps Paternal Uncle     x4  . Colon cancer Neg Hx     ROS: Review of Systems    Constitutional: Positive for malaise/fatigue. Negative for weight loss.  Endo/Heme/Allergies:       Polyphagia  Psychiatric/Behavioral: The patient has insomnia.     PHYSICAL EXAM: Blood pressure 118/72, pulse 82, temperature 98.5 F (36.9 C), temperature source Oral, height 5\' 3"  (1.6 m), weight 222 lb (100.7 kg), SpO2 99 %. Body mass  index is 39.33 kg/m. Physical Exam  Constitutional: She is oriented to person, place, and time. She appears well-developed and well-nourished.  Cardiovascular: Normal rate.   Pulmonary/Chest: Effort normal.  Musculoskeletal: Normal range of motion.  Neurological: She is oriented to person, place, and time.  Skin: Skin is warm and dry.  Psychiatric: She has a normal mood and affect. Her behavior is normal.  Vitals reviewed.   RECENT LABS AND TESTS: BMET    Component Value Date/Time   NA 141 08/26/2016 1145   K 4.3 08/26/2016 1145   CL 100 08/26/2016 1145   CO2 26 08/26/2016 1145   GLUCOSE 86 08/26/2016 1145   GLUCOSE 87 08/18/2016 0804   GLUCOSE 97 07/27/2006 1219   BUN 13 08/26/2016 1145   CREATININE 0.73 08/26/2016 1145   CREATININE 0.74 05/13/2013 1024   CALCIUM 9.7 08/26/2016 1145   GFRNONAA 108 08/26/2016 1145   GFRAA 124 08/26/2016 1145   Lab Results  Component Value Date   HGBA1C 5.3 08/26/2016   HGBA1C 5.5 10/10/2015   HGBA1C 5.2 04/21/2012   Lab Results  Component Value Date   INSULIN 13.0 08/26/2016   CBC    Component Value Date/Time   WBC 6.2 08/26/2016 1145   WBC 8.6 08/18/2016 0804   RBC 4.23 08/26/2016 1145   RBC 4.35 08/18/2016 0804   HGB 11.2 (L) 08/18/2016 0804   HCT 33.3 (L) 08/26/2016 1145   PLT 250.0 08/18/2016 0804   MCV 79 08/26/2016 1145   MCH 25.5 (L) 08/26/2016 1145   MCH 25.0 (L) 01/03/2014 1916   MCHC 32.4 08/26/2016 1145   MCHC 33.2 08/18/2016 0804   RDW 15.1 08/26/2016 1145   LYMPHSABS 1.9 08/26/2016 1145   MONOABS 0.5 08/18/2016 0804   EOSABS 0.9 (H) 08/26/2016 1145   BASOSABS 0.0  08/26/2016 1145   Iron/TIBC/Ferritin/ %Sat    Component Value Date/Time   IRON 61 08/26/2016 1145   TIBC 415 08/26/2016 1145   FERRITIN 18 08/26/2016 1145   IRONPCTSAT 15 08/26/2016 1145   IRONPCTSAT 11 (L) 03/29/2015 0950   Lipid Panel     Component Value Date/Time   CHOL 184 08/26/2016 1145   TRIG 131 08/26/2016 1145   HDL 52 08/26/2016 1145   CHOLHDL 4 08/18/2016 0804   VLDL 29.8 08/18/2016 0804   LDLCALC 106 (H) 08/26/2016 1145   Hepatic Function Panel     Component Value Date/Time   PROT 7.7 08/26/2016 1145   ALBUMIN 4.0 08/26/2016 1145   AST 22 08/26/2016 1145   ALT 16 08/26/2016 1145   ALKPHOS 72 08/26/2016 1145   BILITOT 0.3 08/26/2016 1145   BILIDIR 0.1 08/18/2016 0804      Component Value Date/Time   TSH 1.240 08/26/2016 1145   TSH 2.33 08/18/2016 0804   TSH 2.04 08/27/2015 0829    ASSESSMENT AND PLAN: Vitamin D deficiency - Plan: Vitamin D, Ergocalciferol, (DRISDOL) 50000 units CAPS capsule  Insulin resistance - Plan: metFORMIN (GLUCOPHAGE) 500 MG tablet  Insomnia, unspecified type - Plan: Melatonin 5 MG TABS  At risk for diabetes mellitus - Plan: metFORMIN (GLUCOPHAGE) 500 MG tablet  Class 2 obesity without serious comorbidity with body mass index (BMI) of 39.0 to 39.9 in adult, unspecified obesity type  PLAN:  Vitamin D Deficiency Sharise was informed that low vitamin D levels contributes to fatigue and are associated with obesity, breast, and colon cancer. She agrees to start to take prescription Vit D @50 ,000 IU every week #4 with no refills and will  follow up for routine testing of vitamin D, at least 2-3 times per year. She was informed of the risk of over-replacement of vitamin D and agrees to not increase her dose unless he discusses this with [place] first.  Insulin Resistance Gini will continue to work on weight loss, exercise, and decreasing simple carbohydrates in her diet to help decrease the risk of diabetes. We dicussed metformin  including benefits and risks. She was informed that eating too many simple carbohydrates or too many calories at one sitting increases the likelihood of GI side effects. Mikeya agrees to start to take Metformin 500 MG, every morning with food, #30 with no refills. Darcia agreed to follow up with [place] as directed to monitor her progress.  Diabetes risk counselling Hanya was given extended (at least 15 minutes) diabetes prevention counseling today. She is 36 y.o. female and has risk factors for diabetes including obesity. We discussed intensive lifestyle modifications today with an emphasis on weight loss as well as increasing exercise and decreasing simple carbohydrates in her diet.  Insomnia Start OTC Melatonin and will follow up in 2 weeks.  Obesity Corenne is currently in the action stage of change. As such, her goal is to continue with weight loss efforts She has agreed to follow the Category 2 plan Joclyn has been instructed to work up to a goal of 150 minutes of combined cardio and strengthening exercise per week or 10,000 steps a day for weight loss and overall health benefits. We discussed the following Behavioral Modification Stratagies today: decreasing simple carbohydrates , increasing vegetables, work on meal planning and easy cooking plans and dealing with family or coworker sabotage  Tatyana has agreed to follow up with our clinic in 2 weeks. She was informed of the importance of frequent follow up visits to maximize her success with intensive lifestyle modifications for her multiple health conditions.  I, Doreene Nest, am acting as scribe for Dennard Nip, MD  I have reviewed the above documentation for accuracy and completeness, and I agree with the above. -Dennard Nip, MD

## 2016-09-22 ENCOUNTER — Ambulatory Visit: Payer: BLUE CROSS/BLUE SHIELD

## 2016-09-25 ENCOUNTER — Ambulatory Visit: Payer: BLUE CROSS/BLUE SHIELD | Admitting: Psychology

## 2016-09-29 ENCOUNTER — Ambulatory Visit (INDEPENDENT_AMBULATORY_CARE_PROVIDER_SITE_OTHER): Payer: BLUE CROSS/BLUE SHIELD | Admitting: Family Medicine

## 2016-09-29 VITALS — BP 110/70 | HR 89 | Temp 98.5°F | Ht 63.0 in | Wt 224.0 lb

## 2016-09-29 DIAGNOSIS — E8881 Metabolic syndrome: Secondary | ICD-10-CM

## 2016-09-29 DIAGNOSIS — Z6839 Body mass index (BMI) 39.0-39.9, adult: Secondary | ICD-10-CM | POA: Diagnosis not present

## 2016-09-29 DIAGNOSIS — E669 Obesity, unspecified: Secondary | ICD-10-CM

## 2016-09-29 DIAGNOSIS — IMO0001 Reserved for inherently not codable concepts without codable children: Secondary | ICD-10-CM

## 2016-10-01 NOTE — Progress Notes (Signed)
Office: [phone]  /  Fax: [phone]   HPI:   Chief Complaint: OBESITY Deanna Schwartz is here to discuss her progress with her obesity treatment plan. She is following her eating plan approximately 50 % of the time and states she is exercising 3 times per week. Deanna Schwartz is currently struggling to follow the plan, she is also struggling to plan meals ahead of time. Her weight is 224 lb (101.6 kg) today and has had a weight gain of 2 lbs over a 2 week period since her last visit. She has gained 5 lbs since starting treatment with [place].  Insulin Resistance Deanna Schwartz has a diagnosis of insulin resistance based on her elevated fasting insulin level >5. Although Deanna Schwartz's blood glucose readings are still under good control, insulin resistance puts her at greater risk of metabolic syndrome and diabetes. She is not taking her metformin currently, she frequently forgets. She is consuming too many carbohydrates and reports polyphagia. She continues to work on diet and exercise to decrease risk of diabetes.  Wt Readings from Last 500 Encounters:  09/29/16 224 lb (101.6 kg)  09/16/16 222 lb (100.7 kg)  08/26/16 219 lb 6.4 oz (99.5 kg)  08/15/16 225 lb (102.1 kg)  07/07/16 226 lb (102.5 kg)  06/10/16 224 lb (101.6 kg)  05/30/16 222 lb (100.7 kg)  03/14/16 221 lb (100.2 kg)  02/27/16 224 lb (101.6 kg)  11/22/15 223 lb (101.2 kg)  10/10/15 225 lb (102.1 kg)  08/27/15 224 lb (101.6 kg)  08/17/15 221 lb (100.2 kg)  06/22/15 221 lb (100.2 kg)  05/30/15 220 lb (99.8 kg)  04/18/15 221 lb (100.2 kg)  02/21/15 221 lb (100.2 kg)  02/02/15 217 lb (98.4 kg)  01/22/15 217 lb (98.4 kg)  01/03/15 215 lb (97.5 kg)  11/13/14 217 lb 8 oz (98.7 kg)  10/04/14 212 lb 4 oz (96.3 kg)  09/22/14 214 lb 6 oz (97.2 kg)  09/14/14 212 lb 9.6 oz (96.4 kg)  07/12/14 215 lb (97.5 kg)  06/20/14 213 lb (96.6 kg)  05/09/14 214 lb (97.1 kg)  04/07/14 214 lb (97.1 kg)  02/15/14 212 lb 9.6 oz (96.4 kg)  01/25/14 212  lb (96.2 kg)  11/19/13 211 lb (95.7 kg)  11/18/13 213 lb (96.6 kg)  11/05/13 211 lb (95.7 kg)  06/30/13 206 lb (93.4 kg)  05/26/13 200 lb 12.8 oz (91.1 kg)  05/13/13 200 lb (90.7 kg)  11/13/12 203 lb (92.1 kg)  10/05/12 201 lb 12.8 oz (91.5 kg)  04/29/12 191 lb 9.6 oz (86.9 kg)  04/21/12 197 lb (89.4 kg)  01/19/12 188 lb (85.3 kg)  01/13/12 188 lb (85.3 kg)  01/13/12 188 lb (85.3 kg)  01/09/12 190 lb (86.2 kg)  12/29/11 190 lb 9.6 oz (86.5 kg)  08/28/11 193 lb (87.5 kg)  03/24/11 201 lb (91.2 kg)  02/27/11 196 lb (88.9 kg)  10/11/10 193 lb (87.5 kg)  09/04/10 194 lb (88 kg)  07/10/10 197 lb (89.4 kg)  06/22/09 184 lb (83.5 kg)  05/07/09 178 lb 12.8 oz (81.1 kg)  03/27/09 175 lb (79.4 kg)  03/09/09 176 lb 3.2 oz (79.9 kg)  11/01/08 183 lb (83 kg)  08/07/08 186 lb (84.4 kg)  05/26/08 194 lb (88 kg)  05/03/08 195 lb (88.5 kg)  04/07/08 194 lb 2.1 oz (88.1 kg)  03/29/08 197 lb (89.4 kg)  03/03/08 201 lb (91.2 kg)  01/11/08 201 lb (91.2 kg)  11/25/07 208 lb (94.3 kg)  10/20/07 202 lb (91.6 kg)  01/01/07 182 lb (82.6 kg)     ALLERGIES: Allergies  Allergen Reactions  . Augmentin [Amoxicillin-Pot Clavulanate] Rash  . Cephalexin Rash  . Iodine Swelling and Rash    Throat swelling after eating shrimp    MEDICATIONS: Current Outpatient Prescriptions on File Prior to Visit  Medication Sig Dispense Refill  . Albuterol Sulfate (PROAIR RESPICLICK) [id number] (90 Base) MCG/ACT AEPB Inhale 1-2 puffs into the lungs 4 (four) times daily as needed. 1 each 11  . beclomethasone (QVAR) 40 MCG/ACT inhaler Inhale 2 puffs into the lungs 2 (two) times daily. 1 Inhaler 12  . escitalopram (LEXAPRO) 10 MG tablet Take 1 tablet (10 mg total) by mouth daily. 30 tablet 0  . esomeprazole (NEXIUM) 40 MG capsule Take 40 mg by mouth daily at 12 noon.    . fluticasone (FLONASE) 50 MCG/ACT nasal spray Place 2 sprays into both nostrils daily. 16 g 6  . metFORMIN (GLUCOPHAGE) 500 MG tablet Take 1 tablet (500  mg total) by mouth daily with breakfast. 30 tablet 0  . Vitamin D, Ergocalciferol, (DRISDOL) 50000 units CAPS capsule Take 1 capsule (50,000 Units total) by mouth every 7 (seven) days. 4 capsule 0   No current facility-administered medications on file prior to visit.     PAST MEDICAL HISTORY: Past Medical History:  Diagnosis Date  . Allergic rhinitis   . Anemia   . Anxiety   . Asthma   . 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 Chronic constipation   . Constipation   . Depression   . GERD (gastroesophageal reflux disease)   . H. pylori infection 2008   Hx of tx + serology  . Iron deficiency anemia 08/06/2011   Hgb 10.2 and MCV 79 on health screen labs   . Irritable bowel syndrome   . Obesity   . Palpitations   . Panic attack   . Shortness of breath   . Ulcer ([place])     PAST SURGICAL HISTORY: Past Surgical History:  Procedure Laterality Date  . FLEXIBLE SIGMOIDOSCOPY  04/14/2008   normal  . NASAL SINUS SURGERY    . UPPER GASTROINTESTINAL ENDOSCOPY      SOCIAL HISTORY: Social History  Substance Use Topics  . Smoking status: Never Smoker  . Smokeless tobacco: Never Used  . Alcohol use 0.0 oz/week     Comment: Social maybe twice amonth, beer    FAMILY HISTORY: Family History  Problem Relation Age of Onset  . Diabetes      grandmother  . Hypertension    . Hypertension Mother   . Anxiety disorder Mother   . Obesity Mother   . Hypertension Father   . Diabetes Maternal Aunt   . Diabetes Maternal Aunt   . Diabetes Paternal Grandmother   . Colon polyps Paternal Uncle     x4  . Colon cancer Neg Hx     ROS: Review of Systems  Constitutional: Negative for weight loss.  Endo/Heme/Allergies:       Positive polyphagia    PHYSICAL EXAM: Blood pressure 110/70, pulse 89, temperature 98.5 F (36.9 C), temperature source Oral, height 5\' 3"  (1.6 m), weight 224 lb (101.6 kg), SpO2 97 %. Body mass index is 39.68 kg/m. Physical  Exam  Constitutional: She is oriented to person, place, and time. She appears well-developed and well-nourished.  Cardiovascular: Normal rate.   Pulmonary/Chest: Effort normal.  Musculoskeletal: Normal range of motion. She exhibits edema (trace edema bilateral lower extremities).  Neurological: She is oriented to person, place, and time.  Skin: Skin is warm and dry.  Psychiatric: She has a normal mood and affect. Her behavior is normal.  Vitals reviewed.   RECENT LABS AND TESTS: BMET    Component Value Date/Time   NA 141 08/26/2016 1145   K 4.3 08/26/2016 1145   CL 100 08/26/2016 1145   CO2 26 08/26/2016 1145   GLUCOSE 86 08/26/2016 1145   GLUCOSE 87 08/18/2016 0804   GLUCOSE 97 07/27/2006 1219   BUN 13 08/26/2016 1145   CREATININE 0.73 08/26/2016 1145   CREATININE 0.74 05/13/2013 1024   CALCIUM 9.7 08/26/2016 1145   GFRNONAA 108 08/26/2016 1145   GFRAA 124 08/26/2016 1145   Lab Results  Component Value Date   HGBA1C 5.3 08/26/2016   HGBA1C 5.5 10/10/2015   HGBA1C 5.2 04/21/2012   Lab Results  Component Value Date   INSULIN 13.0 08/26/2016   CBC    Component Value Date/Time   WBC 6.2 08/26/2016 1145   WBC 8.6 08/18/2016 0804   RBC 4.23 08/26/2016 1145   RBC 4.35 08/18/2016 0804   HGB 11.2 (L) 08/18/2016 0804   HCT 33.3 (L) 08/26/2016 1145   PLT 250.0 08/18/2016 0804   MCV 79 08/26/2016 1145   MCH 25.5 (L) 08/26/2016 1145   MCH 25.0 (L) 01/03/2014 1916   MCHC 32.4 08/26/2016 1145   MCHC 33.2 08/18/2016 0804   RDW 15.1 08/26/2016 1145   LYMPHSABS 1.9 08/26/2016 1145   MONOABS 0.5 08/18/2016 0804   EOSABS 0.9 (H) 08/26/2016 1145   BASOSABS 0.0 08/26/2016 1145   Iron/TIBC/Ferritin/ %Sat    Component Value Date/Time   IRON 61 08/26/2016 1145   TIBC 415 08/26/2016 1145   FERRITIN 18 08/26/2016 1145   IRONPCTSAT 15 08/26/2016 1145   IRONPCTSAT 11 (L) 03/29/2015 0950   Lipid Panel     Component Value Date/Time   CHOL 184 08/26/2016 1145   TRIG 131  08/26/2016 1145   HDL 52 08/26/2016 1145   CHOLHDL 4 08/18/2016 0804   VLDL 29.8 08/18/2016 0804   LDLCALC 106 (H) 08/26/2016 1145   Hepatic Function Panel     Component Value Date/Time   PROT 7.7 08/26/2016 1145   ALBUMIN 4.0 08/26/2016 1145   AST 22 08/26/2016 1145   ALT 16 08/26/2016 1145   ALKPHOS 72 08/26/2016 1145   BILITOT 0.3 08/26/2016 1145   BILIDIR 0.1 08/18/2016 0804      Component Value Date/Time   TSH 1.240 08/26/2016 1145   TSH 2.33 08/18/2016 0804   TSH 2.04 08/27/2015 0829    ASSESSMENT AND PLAN: Insulin resistance  PLAN:  Insulin Resistance Deanna Schwartz will continue to work on weight loss, exercise, and decreasing simple carbohydrates in her diet to help decrease the risk of diabetes. We dicussed metformin including benefits and risks and ways to remember to take her medication. She was informed that eating too many simple carbohydrates or too many calories at one sitting increases the likelihood of GI side effects. Deanna Schwartz requested metformin for now and prescription was written today, no refills needed . Deanna Schwartz agreed to follow up with [place] as directed to monitor her progress.  We spent > 50% of today's 30 minute visit on counseling and coordination of care documented in the above note.  Obesity Deanna Schwartz is currently in the action stage of change. As such, her goal is to continue with weight loss efforts She has agreed to change to a Low carbohydrate diet plan  Deanna Schwartz has been instructed to work up to a goal of 150 minutes of combined cardio and strengthening exercise per week for weight loss and overall health benefits. We discussed the following Behavioral Modification Stratagies today: increasing lean protein intake, decreasing simple carbohydrates , increasing vegetables and decreasing sodium intake   Deanna Schwartz has agreed to follow up with our clinic in 2 weeks. She was informed of the importance of frequent follow up visits to maximize her success  with intensive lifestyle modifications for her multiple health conditions.  I, Doreene Nest, am acting as scribe for Dennard Nip, MD  I have reviewed the above documentation for accuracy and completeness, and I agree with the above. -Dennard Nip, MD

## 2016-10-10 ENCOUNTER — Ambulatory Visit (INDEPENDENT_AMBULATORY_CARE_PROVIDER_SITE_OTHER): Payer: BLUE CROSS/BLUE SHIELD | Admitting: Family Medicine

## 2016-10-10 ENCOUNTER — Encounter: Payer: Self-pay | Admitting: Family Medicine

## 2016-10-10 VITALS — BP 122/82 | HR 98 | Ht 63.0 in | Wt 225.0 lb

## 2016-10-10 DIAGNOSIS — G5702 Lesion of sciatic nerve, left lower limb: Secondary | ICD-10-CM | POA: Diagnosis not present

## 2016-10-10 DIAGNOSIS — M999 Biomechanical lesion, unspecified: Secondary | ICD-10-CM | POA: Diagnosis not present

## 2016-10-10 MED ORDER — VENLAFAXINE HCL ER 37.5 MG PO CP24: 37.5000 mg | ORAL_CAPSULE | Freq: Every day | ORAL | 1 refills | Status: DC

## 2016-10-10 MED FILL — VENLAFAXINE HCL ER 37.5 MG: 37.5 | 30 days supply | Qty: 30 | Fill #0

## 2016-10-10 NOTE — Assessment & Plan Note (Signed)
Pain is more over the right side. More positive on the right side. We discussed radicular symptoms. Icing regimen. Core strengthening. Does respond well to osteopathic manipulation. We'll continue to monitor closely. Follow-up again in 2-3 weeks. Worsening symptoms we discussed possibly piriformis injection. Started on Effexor as well to see if this would help some of the nerve pain.

## 2016-10-10 NOTE — Progress Notes (Signed)
[name] [address] [place], [place] [address] Phone: [phone] Subjective:    I'm seeing this patient by the request  of:  [name], MD   CC: Lower back pain  [id number]  Deanna Schwartz is a 36 y.o. female coming in with complaint of low back pain. Patient did have more what seemed to be a piriformis syndrome. Patient's pain didn't Occur though after an accident. Patient states that she was doing somewhat better and then unfortunately last 2 weeks worsening pain again. Patient states it seems to be mostly in the right buttock still. No significant radiation of the leg, no numbness. Waking her up at night. Making it difficult to do daily activities. Does not rib number any true injury. States that it just seems worsening of previous symptoms.     Past Medical History:  Diagnosis Date  . Allergic rhinitis   . Anemia   . Anxiety   . Asthma   . 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 Chronic constipation   . Constipation   . Depression   . GERD (gastroesophageal reflux disease)   . H. pylori infection 2008   Hx of tx + serology  . Iron deficiency anemia 08/06/2011   Hgb 10.2 and MCV 79 on health screen labs   . Irritable bowel syndrome   . Obesity   . Palpitations   . Panic attack   . Shortness of breath   . Ulcer [facility])    Past Surgical History:  Procedure Laterality Date  . FLEXIBLE SIGMOIDOSCOPY  04/14/2008   normal  . NASAL SINUS SURGERY    . UPPER GASTROINTESTINAL ENDOSCOPY     Social History   Social History  . Marital status: Married    Spouse name: N/A  . Number of children: 3  . Years of education: N/A   Occupational History  . [employer]   Social History Main Topics  . Smoking status: Never Smoker  . Smokeless tobacco: Never Used  . Alcohol use 0.0 oz/week     Comment: Social maybe twice amonth, beer  . Drug use: No  . Sexual activity: Yes   Birth control/ protection: None   Other Topics Concern  . Not on file   Social History Narrative   HS[employer], [employer] in [place]. Occupation: [employer]. married  in Oct 2009. Son born in 2009, [place] dtrs - '07, '11. Marriage in good health.   Allergies  Allergen Reactions  . Augmentin [Amoxicillin-Pot Clavulanate] Rash  . Cephalexin Rash  . Iodine Swelling and Rash    Throat swelling after eating shrimp   Family History  Problem Relation Age of Onset  . Diabetes      grandmother  . Hypertension    . Hypertension Mother   . Anxiety disorder Mother   . Obesity Mother   . Hypertension Father   . Diabetes Maternal Aunt   . Diabetes Maternal Aunt   . Diabetes Paternal Grandmother   . Colon polyps Paternal Uncle     x4  . Colon cancer Neg Hx     Past medical history, social, surgical and family history all reviewed in electronic medical record.  No pertanent information unless stated regarding to the chief complaint.   Review of Systems:Review of systems updated and as accurate as of 10/10/16  No headache, visual changes, nausea, vomiting, diarrhea, constipation, dizziness, abdominal pain,  skin rash, fevers, chills, night sweats, weight loss, swollen lymph nodes, body aches, joint swelling, muscle aches, chest pain, shortness of breath, mood changes.   Objective  Height 5\' 3"  (1.6 m). Systems examined below as of 10/10/16   General: No apparent distress alert and oriented x3 mood and affect normal, dressed appropriately.  HEENT: Pupils equal, extraocular movements intact  Respiratory: Patient's speak in full sentences and does not appear short of breath  Cardiovascular: No lower extremity edema, non tender, no erythema  Skin: Warm dry intact with no signs of infection or rash on extremities or on axial skeleton.  Abdomen: Soft nontender  Neuro: Cranial nerves II through XII are intact, neurovascularly intact in all extremities with 2+ DTRs and 2+ pulses.    Lymph: No lymphadenopathy of posterior or anterior cervical chain or axillae bilaterally.  Gait normal with good balance and coordination.  MSK:  Non tender with full range of motion and good stability and symmetric strength and tone of shoulders, elbows, wrist, hip, knee and ankles bilaterally.  Back Exam:  Inspection: Unremarkable  Motion: Flexion 35 deg, Extension 25 deg, Side Bending to 45 deg bilaterally,  Rotation to 45 deg bilaterally  SLR laying: Negative  XSLR laying: Negative  Palpable tenderness: Tender to palpation over the right piriformis and mild over the right paraspinal musculature.[name] FABER: Positive right side. Sensory change: Gross sensation intact to all lumbar and sacral dermatomes.  Reflexes: 2+ at both patellar tendons, 2+ at achilles tendons, Babinski's downgoing.  Strength at foot  Plantar-flexion: 5/5 Dorsi-flexion: 5/5 Eversion: 5/5 Inversion: 5/5  Leg strength  Quad: 5/5 Hamstring: 5/5 Hip flexor: 5/5 Hip abductors: 5/5  Gait unremarkable.   Osteopathic findings Cervical C2 flexed rotated and side bent right C4 flexed rotated and side bent left C6 flexed rotated and side bent left T3 extended rotated and side bent right inhaled third rib T9 extended rotated and side bent left L2 flexed rotated and side bent right Sacrum right on right    Impression and Recommendations:     This case required medical decision making of moderate complexity.      Note: This dictation was prepared with Dragon dictation along with smaller phrase technology. Any transcriptional errors that result from this process are unintentional.

## 2016-10-10 NOTE — Assessment & Plan Note (Signed)
Decision today to treat with OMT was based on Physical Exam  After verbal consent patient was treated with HVLA, ME, FPR techniques in cervical, thoracic, lumbar and sacral areas  Patient tolerated the procedure well with improvement in symptoms  Patient given exercises, stretches and lifestyle modifications  See medications in patient instructions if given  Patient will follow up in 3-4 weeks  

## 2016-10-10 NOTE — Patient Instructions (Addendum)
Great to see you  Deanna Schwartz is your friend.  Keep up with being active and stay off four wheelers .  Continue the vitamin D Effexor 37.5 mg daily  Duexis 3 times daily for 3 days See me again in 3-4 weeks.

## 2016-10-14 ENCOUNTER — Ambulatory Visit (INDEPENDENT_AMBULATORY_CARE_PROVIDER_SITE_OTHER): Payer: BLUE CROSS/BLUE SHIELD | Admitting: Family Medicine

## 2016-10-14 ENCOUNTER — Encounter (INDEPENDENT_AMBULATORY_CARE_PROVIDER_SITE_OTHER): Payer: Self-pay | Admitting: Family Medicine

## 2016-10-14 VITALS — BP 111/71 | HR 95 | Temp 98.4°F | Resp 16 | Ht 63.0 in | Wt 220.0 lb

## 2016-10-14 DIAGNOSIS — F418 Other specified anxiety disorders: Secondary | ICD-10-CM

## 2016-10-14 DIAGNOSIS — E669 Obesity, unspecified: Secondary | ICD-10-CM | POA: Diagnosis not present

## 2016-10-14 DIAGNOSIS — Z6839 Body mass index (BMI) 39.0-39.9, adult: Secondary | ICD-10-CM

## 2016-10-14 MED ORDER — ESCITALOPRAM OXALATE 10 MG PO TABS: 10.0000 mg | ORAL_TABLET | Freq: Every day | ORAL | 0 refills | Status: DC

## 2016-10-14 MED FILL — ESCITALOPRAM 10 MG TABLET: 10 | 30 days supply | Qty: 30 | Fill #0

## 2016-10-14 NOTE — Progress Notes (Signed)
Office: [phone]  /  Fax: [phone]   HPI:   Chief Complaint: OBESITY Deanna Schwartz is here to discuss her progress with her obesity treatment plan. She is following her eating plan approximately 50 % of the time and states she is exercising 30 minutes 3 times per week. Patient did well with her weight loss. She is trying to do yogo but her chronic pain makes this difficult.  Her weight is 220 lb (99.8 kg) today and has had a weight loss of 4 pounds over a period of 2 weeks since her last visit. She has maintained since starting treatment with [place].  Depression with emotional eating behaviors Deanna Schwartz is struggling with temptations but did well with portion control and smart choices. She started Lexapro and feels her anxiety may be slightly improved with the emotional eating.     Depression screen [facility] 2/9 08/26/2016 05/30/2016 05/30/2015  Decreased Interest 3 0 0  Down, Depressed, Hopeless 1 0 0  PHQ - 2 Score 4 0 0  Altered sleeping 1 - -  Tired, decreased energy 3 - -  Change in appetite 2 - -  Feeling bad or failure about yourself  0 - -  Trouble concentrating 0 - -  Moving slowly or fidgety/restless 0 - -  Suicidal thoughts 0 - -  PHQ-9 Score 10 - -  Some recent data might be hidden      Wt Readings from Last 500 Encounters:  10/14/16 220 lb (99.8 kg)  10/10/16 225 lb (102.1 kg)  09/29/16 224 lb (101.6 kg)  09/16/16 222 lb (100.7 kg)  08/26/16 219 lb 6.4 oz (99.5 kg)  08/15/16 225 lb (102.1 kg)  07/07/16 226 lb (102.5 kg)  06/10/16 224 lb (101.6 kg)  05/30/16 222 lb (100.7 kg)  03/14/16 221 lb (100.2 kg)  02/27/16 224 lb (101.6 kg)  11/22/15 223 lb (101.2 kg)  10/10/15 225 lb (102.1 kg)  08/27/15 224 lb (101.6 kg)  08/17/15 221 lb (100.2 kg)  06/22/15 221 lb (100.2 kg)  05/30/15 220 lb (99.8 kg)  04/18/15 221 lb (100.2 kg)  02/21/15 221 lb (100.2 kg)  02/02/15 217 lb (98.4 kg)  01/22/15 217 lb (98.4 kg)  01/03/15 215 lb (97.5 kg)  11/13/14 217 lb 8 oz (98.7  kg)  10/04/14 212 lb 4 oz (96.3 kg)  09/22/14 214 lb 6 oz (97.2 kg)  09/14/14 212 lb 9.6 oz (96.4 kg)  07/12/14 215 lb (97.5 kg)  06/20/14 213 lb (96.6 kg)  05/09/14 214 lb (97.1 kg)  04/07/14 214 lb (97.1 kg)  02/15/14 212 lb 9.6 oz (96.4 kg)  01/25/14 212 lb (96.2 kg)  11/19/13 211 lb (95.7 kg)  11/18/13 213 lb (96.6 kg)  11/05/13 211 lb (95.7 kg)  06/30/13 206 lb (93.4 kg)  05/26/13 200 lb 12.8 oz (91.1 kg)  05/13/13 200 lb (90.7 kg)  11/13/12 203 lb (92.1 kg)  10/05/12 201 lb 12.8 oz (91.5 kg)  04/29/12 191 lb 9.6 oz (86.9 kg)  04/21/12 197 lb (89.4 kg)  01/19/12 188 lb (85.3 kg)  01/13/12 188 lb (85.3 kg)  01/13/12 188 lb (85.3 kg)  01/09/12 190 lb (86.2 kg)  12/29/11 190 lb 9.6 oz (86.5 kg)  08/28/11 193 lb (87.5 kg)  03/24/11 201 lb (91.2 kg)  02/27/11 196 lb (88.9 kg)  10/11/10 193 lb (87.5 kg)  09/04/10 194 lb (88 kg)  07/10/10 197 lb (89.4 kg)  06/22/09 184 lb (83.5 kg)  05/07/09 178 lb 12.8 oz (  81.1 kg)  03/27/09 175 lb (79.4 kg)  03/09/09 176 lb 3.2 oz (79.9 kg)  11/01/08 183 lb (83 kg)  08/07/08 186 lb (84.4 kg)  05/26/08 194 lb (88 kg)  05/03/08 195 lb (88.5 kg)  04/07/08 194 lb 2.1 oz (88.1 kg)  03/29/08 197 lb (89.4 kg)  03/03/08 201 lb (91.2 kg)  01/11/08 201 lb (91.2 kg)  11/25/07 208 lb (94.3 kg)  10/20/07 202 lb (91.6 kg)  01/01/07 182 lb (82.6 kg)     ALLERGIES: Allergies  Allergen Reactions  . Augmentin [Amoxicillin-Pot Clavulanate] Rash  . Cephalexin Rash  . Iodine Swelling and Rash    Throat swelling after eating shrimp    MEDICATIONS: Current Outpatient Prescriptions on File Prior to Visit  Medication Sig Dispense Refill  . Albuterol Sulfate (PROAIR RESPICLICK) 123XX123 (90 Base) MCG/ACT AEPB Inhale 1-2 puffs into the lungs 4 (four) times daily as needed. 1 each 11  . beclomethasone (QVAR) 40 MCG/ACT inhaler Inhale 2 puffs into the lungs 2 (two) times daily. 1 Inhaler 12  . esomeprazole (NEXIUM) 40 MG capsule Take 40 mg by mouth daily  at 12 noon.    . fluticasone (FLONASE) 50 MCG/ACT nasal spray Place 2 sprays into both nostrils daily. 16 g 6  . metFORMIN (GLUCOPHAGE) 500 MG tablet Take 1 tablet (500 mg total) by mouth daily with breakfast. 30 tablet 0  . venlafaxine XR (EFFEXOR XR) 37.5 MG 24 hr capsule Take 1 capsule (37.5 mg total) by mouth daily with breakfast. 30 capsule 1  . Vitamin D, Ergocalciferol, (DRISDOL) 50000 units CAPS capsule Take 1 capsule (50,000 Units total) by mouth every 7 (seven) days. 4 capsule 0   No current facility-administered medications on file prior to visit.     PAST MEDICAL HISTORY: Past Medical History:  Diagnosis Date  . Allergic rhinitis   . Anemia   . Anxiety   . Asthma   . 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 Chronic constipation   . Constipation   . Depression   . GERD (gastroesophageal reflux disease)   . H. pylori infection 2008   Hx of tx + serology  . Iron deficiency anemia 08/06/2011   Hgb 10.2 and MCV 79 on health screen labs   . Irritable bowel syndrome   . Obesity   . Palpitations   . Panic attack   . Shortness of breath   . Ulcer ([place])     PAST SURGICAL HISTORY: Past Surgical History:  Procedure Laterality Date  . FLEXIBLE SIGMOIDOSCOPY  04/14/2008   normal  . NASAL SINUS SURGERY    . UPPER GASTROINTESTINAL ENDOSCOPY      SOCIAL HISTORY: Social History  Substance Use Topics  . Smoking status: Never Smoker  . Smokeless tobacco: Never Used  . Alcohol use 0.0 oz/week     Comment: Social maybe twice amonth, beer    FAMILY HISTORY: Family History  Problem Relation Age of Onset  . Diabetes      grandmother  . Hypertension    . Hypertension Mother   . Anxiety disorder Mother   . Obesity Mother   . Hypertension Father   . Diabetes Maternal Aunt   . Diabetes Maternal Aunt   . Diabetes Paternal Grandmother   . Colon polyps Paternal Uncle     x4  . Colon cancer Neg Hx     ROS: Review of Systems    Constitutional: Positive for weight loss.  All other systems reviewed and are negative.   PHYSICAL EXAM: Blood pressure 111/71, pulse 95, temperature 98.4 F (36.9 C), temperature source Oral, resp. rate 16, height 5\' 3"  (1.6 m), weight 220 lb (99.8 kg), SpO2 100 %. Body mass index is 38.97 kg/m. Physical Exam  Constitutional: She is oriented to person, place, and time. She appears well-developed and well-nourished.  Eyes: Pupils are equal, round, and reactive to light.  Neck: Normal range of motion.  Cardiovascular: Normal rate.   Pulmonary/Chest: Effort normal.  Musculoskeletal: Normal range of motion.  Neurological: She is alert and oriented to person, place, and time.  Skin: Skin is warm and dry.  Psychiatric: She has a normal mood and affect. Her behavior is normal.  Vitals reviewed.   RECENT LABS AND TESTS: BMET    Component Value Date/Time   NA 141 08/26/2016 1145   K 4.3 08/26/2016 1145   CL 100 08/26/2016 1145   CO2 26 08/26/2016 1145   GLUCOSE 86 08/26/2016 1145   GLUCOSE 87 08/18/2016 0804   GLUCOSE 97 07/27/2006 1219   BUN 13 08/26/2016 1145   CREATININE 0.73 08/26/2016 1145   CREATININE 0.74 05/13/2013 1024   CALCIUM 9.7 08/26/2016 1145   GFRNONAA 108 08/26/2016 1145   GFRAA 124 08/26/2016 1145   Lab Results  Component Value Date   HGBA1C 5.3 08/26/2016   HGBA1C 5.5 10/10/2015   HGBA1C 5.2 04/21/2012   Lab Results  Component Value Date   INSULIN 13.0 08/26/2016   CBC    Component Value Date/Time   WBC 6.2 08/26/2016 1145   WBC 8.6 08/18/2016 0804   RBC 4.23 08/26/2016 1145   RBC 4.35 08/18/2016 0804   HGB 11.2 (L) 08/18/2016 0804   HCT 33.3 (L) 08/26/2016 1145   PLT 250.0 08/18/2016 0804   MCV 79 08/26/2016 1145   MCH 25.5 (L) 08/26/2016 1145   MCH 25.0 (L) 01/03/2014 1916   MCHC 32.4 08/26/2016 1145   MCHC 33.2 08/18/2016 0804   RDW 15.1 08/26/2016 1145   LYMPHSABS 1.9 08/26/2016 1145   MONOABS 0.5 08/18/2016 0804   EOSABS 0.9 (H)  08/26/2016 1145   BASOSABS 0.0 08/26/2016 1145   Iron/TIBC/Ferritin/ %Sat    Component Value Date/Time   IRON 61 08/26/2016 1145   TIBC 415 08/26/2016 1145   FERRITIN 18 08/26/2016 1145   IRONPCTSAT 15 08/26/2016 1145   IRONPCTSAT 11 (L) 03/29/2015 0950   Lipid Panel     Component Value Date/Time   CHOL 184 08/26/2016 1145   TRIG 131 08/26/2016 1145   HDL 52 08/26/2016 1145   CHOLHDL 4 08/18/2016 0804   VLDL 29.8 08/18/2016 0804   LDLCALC 106 (H) 08/26/2016 1145   Hepatic Function Panel     Component Value Date/Time   PROT 7.7 08/26/2016 1145   ALBUMIN 4.0 08/26/2016 1145   AST 22 08/26/2016 1145   ALT 16 08/26/2016 1145   ALKPHOS 72 08/26/2016 1145   BILITOT 0.3 08/26/2016 1145   BILIDIR 0.1 08/18/2016 0804      Component Value Date/Time   TSH 1.240 08/26/2016 1145   TSH 2.33 08/18/2016 0804   TSH 2.04 08/27/2015 0829    ASSESSMENT AND PLAN: Depression with anxiety - Plan: escitalopram (LEXAPRO) 10 MG tablet  Class 2 obesity without serious comorbidity with body mass index (BMI) of 39.0 to 39.9 in adult, unspecified obesity type  PLAN: Depression with Emotional Eating Behaviors We discussed behavior modification techniques today to help Deanna Schwartz deal with her emotional eating  and depression. She has agreed to refill her Lexapro for one month. Will follow up to asses the status.   Obesity Deanna Schwartz is currently in the action stage of change. As such, her goal is to continue with weight loss efforts She has agreed to keep a food journal with 1100-1300 calories and 70 protein daily and follow the Category 2 plan Deanna Schwartz has been instructed to work up to a goal of 150 minutes of combined cardio and strengthening exercise per week for weight loss and overall health benefits. We discussed the following Behavioral Modification Stratagies today: increasing lean protein intake, decreasing simple carbohydrates  and increasing lower sugar fruits  Deanna Schwartz has agreed  to follow up with our clinic in 2 weeks. She was informed of the importance of frequent follow up visits to maximize her success with intensive lifestyle modifications for her multiple health conditions.  I, April Moore , am acting as Education administrator for Dennard Nip, MD  I have reviewed the above documentation for accuracy and completeness, and I agree with the above. -Dennard Nip, MD

## 2016-10-15 ENCOUNTER — Ambulatory Visit (INDEPENDENT_AMBULATORY_CARE_PROVIDER_SITE_OTHER): Payer: BLUE CROSS/BLUE SHIELD | Admitting: Family Medicine

## 2016-10-23 MED FILL — VENTOLIN HFA 90 MCG INHALER: 108 (90 BAS | 25 days supply | Qty: 18 | Fill #2

## 2016-10-30 ENCOUNTER — Ambulatory Visit (INDEPENDENT_AMBULATORY_CARE_PROVIDER_SITE_OTHER): Payer: BLUE CROSS/BLUE SHIELD | Admitting: Family Medicine

## 2016-10-30 ENCOUNTER — Encounter (INDEPENDENT_AMBULATORY_CARE_PROVIDER_SITE_OTHER): Payer: Self-pay

## 2016-11-03 ENCOUNTER — Ambulatory Visit: Payer: BLUE CROSS/BLUE SHIELD | Admitting: Family Medicine

## 2016-11-10 ENCOUNTER — Ambulatory Visit (INDEPENDENT_AMBULATORY_CARE_PROVIDER_SITE_OTHER): Payer: BLUE CROSS/BLUE SHIELD | Admitting: Family Medicine

## 2016-11-10 VITALS — BP 114/76 | HR 89 | Temp 98.8°F | Ht 63.0 in | Wt 223.0 lb

## 2016-11-10 DIAGNOSIS — E8881 Metabolic syndrome: Secondary | ICD-10-CM | POA: Diagnosis not present

## 2016-11-10 DIAGNOSIS — F329 Major depressive disorder, single episode, unspecified: Secondary | ICD-10-CM | POA: Insufficient documentation

## 2016-11-10 DIAGNOSIS — F3289 Other specified depressive episodes: Secondary | ICD-10-CM

## 2016-11-10 DIAGNOSIS — Z9189 Other specified personal risk factors, not elsewhere classified: Secondary | ICD-10-CM | POA: Diagnosis not present

## 2016-11-10 DIAGNOSIS — R7303 Prediabetes: Secondary | ICD-10-CM | POA: Diagnosis not present

## 2016-11-10 DIAGNOSIS — F32A Depression, unspecified: Secondary | ICD-10-CM | POA: Insufficient documentation

## 2016-11-10 DIAGNOSIS — E669 Obesity, unspecified: Secondary | ICD-10-CM

## 2016-11-10 MED ORDER — BUPROPION HCL ER (SR) 150 MG PO TB12: 150.0000 mg | ORAL_TABLET | Freq: Every morning | ORAL | 0 refills | Status: DC

## 2016-11-10 MED ORDER — METFORMIN HCL 500 MG PO TABS: 500.0000 mg | ORAL_TABLET | Freq: Every day | ORAL | 0 refills | Status: DC

## 2016-11-10 MED FILL — BUPROPION SR 150 MG TABLET: 150 | 30 days supply | Qty: 30 | Fill #0

## 2016-11-10 MED FILL — metFORMIN HCL 500 MG TABS: 500 | 30 days supply | Qty: 30 | Fill #0

## 2016-11-10 NOTE — Progress Notes (Signed)
Office: [phone]  /  Fax: [phone]   HPI:   Chief Complaint: OBESITY Deanna Schwartz is here to discuss her progress with her obesity treatment plan. She is following her eating plan approximately 80 % of the time and states she is exercising 0 minutes 0 times per week. Deanna Schwartz is off track in the past 2 weeks with daughter in the hospital with an asthma exacerbation. She notes increased emotional eating and not able to plan ahead well. She is mostly trying to portion control/smart choices.  Her weight is 223 lb (101.2 kg) today and has had a weight gain of 3 lbs over a period of 4 weeks since her last visit. She has lost 0 lbs since starting treatment with [place].  Depression with emotional eating behaviors Deanna Schwartz is struggling with emotional eating and using food for comfort to the extent that it is negatively impacting her health. She often snacks when she is not hungry. Deanna Schwartz is currently on Lexapro, has now started Effexor, prescribed by her Orthopaedic physician, she notes significant emotional eating, depressed mood and is frustrated she cannot seem to get her eating under control.   Pre-Diabetes Deanna Schwartz has a diagnosis of prediabetes based on her elevated Hgb A1c and was informed this puts her at greater risk of developing diabetes. She is taking metformin currently and continues to work on diet and exercise to decrease risk of diabetes. She denies nausea, vomiting or hypoglycemia. Deanna Schwartz requests refill of Metformin.   Depression screen [facility] 2/9 08/26/2016 05/30/2016 05/30/2015  Decreased Interest 3 0 0  Down, Depressed, Hopeless 1 0 0  PHQ - 2 Score 4 0 0  Altered sleeping 1 - -  Tired, decreased energy 3 - -  Change in appetite 2 - -  Feeling bad or failure about yourself  0 - -  Trouble concentrating 0 - -  Moving slowly or fidgety/restless 0 - -  Suicidal thoughts 0 - -  PHQ-9 Score 10 - -  Some recent data might be hidden     Wt Readings from Last 500  Encounters:  11/10/16 223 lb (101.2 kg)  10/14/16 220 lb (99.8 kg)  10/10/16 225 lb (102.1 kg)  09/29/16 224 lb (101.6 kg)  09/16/16 222 lb (100.7 kg)  08/26/16 219 lb 6.4 oz (99.5 kg)  08/15/16 225 lb (102.1 kg)  07/07/16 226 lb (102.5 kg)  06/10/16 224 lb (101.6 kg)  05/30/16 222 lb (100.7 kg)  03/14/16 221 lb (100.2 kg)  02/27/16 224 lb (101.6 kg)  11/22/15 223 lb (101.2 kg)  10/10/15 225 lb (102.1 kg)  08/27/15 224 lb (101.6 kg)  08/17/15 221 lb (100.2 kg)  06/22/15 221 lb (100.2 kg)  05/30/15 220 lb (99.8 kg)  04/18/15 221 lb (100.2 kg)  02/21/15 221 lb (100.2 kg)  02/02/15 217 lb (98.4 kg)  01/22/15 217 lb (98.4 kg)  01/03/15 215 lb (97.5 kg)  11/13/14 217 lb 8 oz (98.7 kg)  10/04/14 212 lb 4 oz (96.3 kg)  09/22/14 214 lb 6 oz (97.2 kg)  09/14/14 212 lb 9.6 oz (96.4 kg)  07/12/14 215 lb (97.5 kg)  06/20/14 213 lb (96.6 kg)  05/09/14 214 lb (97.1 kg)  04/07/14 214 lb (97.1 kg)  02/15/14 212 lb 9.6 oz (96.4 kg)  01/25/14 212 lb (96.2 kg)  11/19/13 211 lb (95.7 kg)  11/18/13 213 lb (96.6 kg)  11/05/13 211 lb (95.7 kg)  06/30/13 206 lb (93.4 kg)  05/26/13 200 lb 12.8 oz (91.1 kg)  05/13/13  200 lb (90.7 kg)  11/13/12 203 lb (92.1 kg)  10/05/12 201 lb 12.8 oz (91.5 kg)  04/29/12 191 lb 9.6 oz (86.9 kg)  04/21/12 197 lb (89.4 kg)  01/19/12 188 lb (85.3 kg)  01/13/12 188 lb (85.3 kg)  01/13/12 188 lb (85.3 kg)  01/09/12 190 lb (86.2 kg)  12/29/11 190 lb 9.6 oz (86.5 kg)  08/28/11 193 lb (87.5 kg)  03/24/11 201 lb (91.2 kg)  02/27/11 196 lb (88.9 kg)  10/11/10 193 lb (87.5 kg)  09/04/10 194 lb (88 kg)  07/10/10 197 lb (89.4 kg)  06/22/09 184 lb (83.5 kg)  05/07/09 178 lb 12.8 oz (81.1 kg)  03/27/09 175 lb (79.4 kg)  03/09/09 176 lb 3.2 oz (79.9 kg)  11/01/08 183 lb (83 kg)  08/07/08 186 lb (84.4 kg)  05/26/08 194 lb (88 kg)  05/03/08 195 lb (88.5 kg)  04/07/08 194 lb 2.1 oz (88.1 kg)  03/29/08 197 lb (89.4 kg)  03/03/08 201 lb (91.2 kg)  01/11/08 201 lb  (91.2 kg)  11/25/07 208 lb (94.3 kg)  10/20/07 202 lb (91.6 kg)  01/01/07 182 lb (82.6 kg)     ALLERGIES: Allergies  Allergen Reactions  . Augmentin [Amoxicillin-Pot Clavulanate] Rash  . Cephalexin Rash  . Iodine Swelling and Rash    Throat swelling after eating shrimp    MEDICATIONS: Current Outpatient Prescriptions on File Prior to Visit  Medication Sig Dispense Refill  . Albuterol Sulfate (PROAIR RESPICLICK) 123XX123 (90 Base) MCG/ACT AEPB Inhale 1-2 puffs into the lungs 4 (four) times daily as needed. 1 each 11  . Ascorbic Acid (VITAMIN C) 1000 MG tablet Take 1,000 mg by mouth daily.    . beclomethasone (QVAR) 40 MCG/ACT inhaler Inhale 2 puffs into the lungs 2 (two) times daily. 1 Inhaler 12  . escitalopram (LEXAPRO) 10 MG tablet Take 1 tablet (10 mg total) by mouth daily. 30 tablet 0  . esomeprazole (NEXIUM) 40 MG capsule Take 40 mg by mouth daily at 12 noon.    . fluticasone (FLONASE) 50 MCG/ACT nasal spray Place 2 sprays into both nostrils daily. 16 g 6  . venlafaxine XR (EFFEXOR XR) 37.5 MG 24 hr capsule Take 1 capsule (37.5 mg total) by mouth daily with breakfast. 30 capsule 1  . Vitamin D, Ergocalciferol, (DRISDOL) 50000 units CAPS capsule Take 1 capsule (50,000 Units total) by mouth every 7 (seven) days. 4 capsule 0   No current facility-administered medications on file prior to visit.     PAST MEDICAL HISTORY: Past Medical History:  Diagnosis Date  . Allergic rhinitis   . Anemia   . Anxiety   . Asthma   . 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 Chronic constipation   . Constipation   . Depression   . GERD (gastroesophageal reflux disease)   . H. pylori infection 2008   Hx of tx + serology  . Iron deficiency anemia 08/06/2011   Hgb 10.2 and MCV 79 on health screen labs   . Irritable bowel syndrome   . Obesity   . Palpitations   . Panic attack   . Shortness of breath   . Ulcer ([place])     PAST SURGICAL HISTORY: Past  Surgical History:  Procedure Laterality Date  . FLEXIBLE SIGMOIDOSCOPY  04/14/2008   normal  . NASAL SINUS SURGERY    . UPPER GASTROINTESTINAL ENDOSCOPY      SOCIAL HISTORY: Social History  Substance Use Topics  .  Smoking status: Never Smoker  . Smokeless tobacco: Never Used  . Alcohol use 0.0 oz/week     Comment: Social maybe twice amonth, beer    FAMILY HISTORY: Family History  Problem Relation Age of Onset  . Diabetes      grandmother  . Hypertension    . Hypertension Mother   . Anxiety disorder Mother   . Obesity Mother   . Hypertension Father   . Diabetes Maternal Aunt   . Diabetes Maternal Aunt   . Diabetes Paternal Grandmother   . Colon polyps Paternal Uncle     x4  . Colon cancer Neg Hx     ROS: Review of Systems  Constitutional: Negative for weight loss.  Gastrointestinal: Negative for nausea and vomiting.  Endo/Heme/Allergies:       Negative hypoglycemia  Psychiatric/Behavioral: Positive for depression.    PHYSICAL EXAM: Blood pressure 114/76, pulse 89, temperature 98.8 F (37.1 C), temperature source Oral, height 5\' 3"  (1.6 m), weight 223 lb (101.2 kg), last menstrual period 10/18/2016, SpO2 99 %. Body mass index is 39.5 kg/m. Physical Exam  Constitutional: She is oriented to person, place, and time. She appears well-developed and well-nourished.  Cardiovascular: Normal rate.   Pulmonary/Chest: Effort normal.  Musculoskeletal: Normal range of motion.  Neurological: She is oriented to person, place, and time.  Skin: Skin is warm and dry.  Vitals reviewed.   RECENT LABS AND TESTS: BMET    Component Value Date/Time   NA 141 08/26/2016 1145   K 4.3 08/26/2016 1145   CL 100 08/26/2016 1145   CO2 26 08/26/2016 1145   GLUCOSE 86 08/26/2016 1145   GLUCOSE 87 08/18/2016 0804   GLUCOSE 97 07/27/2006 1219   BUN 13 08/26/2016 1145   CREATININE 0.73 08/26/2016 1145   CREATININE 0.74 05/13/2013 1024   CALCIUM 9.7 08/26/2016 1145   GFRNONAA 108  08/26/2016 1145   GFRAA 124 08/26/2016 1145   Lab Results  Component Value Date   HGBA1C 5.3 08/26/2016   HGBA1C 5.5 10/10/2015   HGBA1C 5.2 04/21/2012   Lab Results  Component Value Date   INSULIN 13.0 08/26/2016   CBC    Component Value Date/Time   WBC 6.2 08/26/2016 1145   WBC 8.6 08/18/2016 0804   RBC 4.23 08/26/2016 1145   RBC 4.35 08/18/2016 0804   HGB 11.2 (L) 08/18/2016 0804   HCT 33.3 (L) 08/26/2016 1145   PLT 250.0 08/18/2016 0804   MCV 79 08/26/2016 1145   MCH 25.5 (L) 08/26/2016 1145   MCH 25.0 (L) 01/03/2014 1916   MCHC 32.4 08/26/2016 1145   MCHC 33.2 08/18/2016 0804   RDW 15.1 08/26/2016 1145   LYMPHSABS 1.9 08/26/2016 1145   MONOABS 0.5 08/18/2016 0804   EOSABS 0.9 (H) 08/26/2016 1145   BASOSABS 0.0 08/26/2016 1145   Iron/TIBC/Ferritin/ %Sat    Component Value Date/Time   IRON 61 08/26/2016 1145   TIBC 415 08/26/2016 1145   FERRITIN 18 08/26/2016 1145   IRONPCTSAT 15 08/26/2016 1145   IRONPCTSAT 11 (L) 03/29/2015 0950   Lipid Panel     Component Value Date/Time   CHOL 184 08/26/2016 1145   TRIG 131 08/26/2016 1145   HDL 52 08/26/2016 1145   CHOLHDL 4 08/18/2016 0804   VLDL 29.8 08/18/2016 0804   LDLCALC 106 (H) 08/26/2016 1145   Hepatic Function Panel     Component Value Date/Time   PROT 7.7 08/26/2016 1145   ALBUMIN 4.0 08/26/2016 1145   AST 22 08/26/2016  1145   ALT 16 08/26/2016 1145   ALKPHOS 72 08/26/2016 1145   BILITOT 0.3 08/26/2016 1145   BILIDIR 0.1 08/18/2016 0804      Component Value Date/Time   TSH 1.240 08/26/2016 1145   TSH 2.33 08/18/2016 0804   TSH 2.04 08/27/2015 0829    ASSESSMENT AND PLAN: Depression, unspecified depression type - Plan: buPROPion (WELLBUTRIN SR) 150 MG 12 hr tablet  Prediabetes  Obesity (BMI 35.0-39.9 without comorbidity)  Insulin resistance - Plan: metFORMIN (GLUCOPHAGE) 500 MG tablet  At risk for diabetes mellitus - Plan: metFORMIN (GLUCOPHAGE) 500 MG tablet  PLAN:  Depression with  Emotional Eating Behaviors We discussed behavior modification techniques today to help Deanna Schwartz deal with her emotional eating and depression. She has agreed to continue to take Lexapro and start Wellbutrin SR 150 mg qd #30 with no refills and agreed to follow up as directed.  Pre-Diabetes Deanna Schwartz will continue to work on weight loss, exercise, and decreasing simple carbohydrates in her diet to help decrease the risk of diabetes. We dicussed metformin including benefits and risks. She was informed that eating too many simple carbohydrates or too many calories at one sitting increases the likelihood of GI side effects. Deanna Schwartz requested metformin for now and a prescription was written today for 1 month refill. We will re-check labs in 2 weeks and Deanna Schwartz agreed to follow up with [place] as directed to monitor her progress.  Diabetes risk counselling Deanna Schwartz was given extended (at least 15 minutes) diabetes prevention counseling today. She is 36 y.o. female and has risk factors for diabetes including obesity. We discussed intensive lifestyle modifications today with an emphasis on weight loss as well as increasing exercise and decreasing simple carbohydrates in her diet.  Obesity Deanna Schwartz is currently in the action stage of change. As such, her goal is to continue with weight loss efforts She has agreed to change to follow a lower carbohydrate, vegetable and lean protein rich diet plan Deanna Schwartz has been instructed to work up to a goal of 150 minutes of combined cardio and strengthening exercise per week for weight loss and overall health benefits. We discussed the following Behavioral Modification Stratagies today: increasing lean protein intake, decreasing simple carbohydrates , work on meal planning and easy cooking plans and emotional eating strategies  Deanna Schwartz has agreed to follow up with our clinic in 2 weeks. She was informed of the importance of frequent follow up visits to maximize her  success with intensive lifestyle modifications for her multiple health conditions.  I, Doreene Nest, am acting as scribe for Dennard Nip, MD  I have reviewed the above documentation for accuracy and completeness, and I agree with the above. -Dennard Nip, MD

## 2016-11-13 ENCOUNTER — Ambulatory Visit (INDEPENDENT_AMBULATORY_CARE_PROVIDER_SITE_OTHER): Payer: BLUE CROSS/BLUE SHIELD | Admitting: Family Medicine

## 2016-11-13 ENCOUNTER — Encounter: Payer: Self-pay | Admitting: Family Medicine

## 2016-11-13 ENCOUNTER — Ambulatory Visit: Payer: Self-pay

## 2016-11-13 VITALS — BP 108/80 | HR 91 | Ht 63.0 in | Wt 229.0 lb

## 2016-11-13 DIAGNOSIS — S83419A Sprain of medial collateral ligament of unspecified knee, initial encounter: Secondary | ICD-10-CM | POA: Insufficient documentation

## 2016-11-13 DIAGNOSIS — S83412A Sprain of medial collateral ligament of left knee, initial encounter: Secondary | ICD-10-CM | POA: Diagnosis not present

## 2016-11-13 DIAGNOSIS — M222X9 Patellofemoral disorders, unspecified knee: Secondary | ICD-10-CM | POA: Diagnosis not present

## 2016-11-13 DIAGNOSIS — M25562 Pain in left knee: Secondary | ICD-10-CM

## 2016-11-13 DIAGNOSIS — G5702 Lesion of sciatic nerve, left lower limb: Secondary | ICD-10-CM | POA: Diagnosis not present

## 2016-11-13 NOTE — Assessment & Plan Note (Signed)
Continues to have patellofemoral syndrome. Patient does have discomfort. We discussed the possibility of injection. Patient declined. The MCL sprain noted a new home exercises given. We discussed objective is a dome which was to avoid. Follow-up again in 3-4 weeks.

## 2016-11-13 NOTE — Patient Instructions (Addendum)
Good to see you  Ice 20 minutes 2 times daily. Usually after activity and before bed. New exercises 3 times a week.  Try the new brace with activity  See me again in 4 weeks. If not better we will consider injection or PT but you should do well.

## 2016-11-13 NOTE — Assessment & Plan Note (Signed)
Patient does have what appears to be an MCL sprain noted. We discussed icing regimen and home exercises. Patient will do a bracing at a think will be beneficial. Patient will try these interventions by worsening pain we'll consider also injection as well as formal physical therapy.

## 2016-11-13 NOTE — Progress Notes (Signed)
[facility] [address], [place] [address] Phone: [phone] Subjective:    I'm seeing this patient by the request  of:  [name], MD   CC: Lower back pain f/u F/u left knee pain   BJY:[id number]  Deanna Schwartz is a 36 y.o. female coming in with complaint of low back pain. Patient did have more what seemed to be a piriformis syndrome. Patient was seen previously and was started on Effexor recently. We have attempted osteopathic manipulation in the past as well. Patient was given home exercises. Patient states Back seems to be doing relatively better. Patient has been doing the Effexor regularly.  Patient is complaining more of a left knee pain. Patient has had a history previously of meniscal tear. States that this seems to be somewhat different. Seems to be worse going up or down stairs. States it seems to be bad when patient also has pressure on the knee itself. Does not remember any true injury though. Pain seems to be also on the medial aspect of the knee. Denies any swelling. Rates the severity of pain a 5 out of 10 and seems to be worsening. Has noticed last couple days been walking with a limp     Past Medical History:  Diagnosis Date  . Allergic rhinitis   . Anemia   . Anxiety   . Asthma   . 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 Chronic constipation   . Constipation   . Depression   . GERD (gastroesophageal reflux disease)   . H. pylori infection 2008   Hx of tx + serology  . Iron deficiency anemia 08/06/2011   Hgb 10.2 and MCV 79 on health screen labs   . Irritable bowel syndrome   . Obesity   . Palpitations   . Panic attack   . Shortness of breath   . Ulcer [facility])    Past Surgical History:  Procedure Laterality Date  . FLEXIBLE SIGMOIDOSCOPY  04/14/2008   normal  . NASAL SINUS SURGERY    . UPPER GASTROINTESTINAL ENDOSCOPY     Social History   Social History  . Marital  status: Married    Spouse name: N/A  . Number of children: 3  . Years of education: N/A   Occupational History  .  GI [employer]   Social History Main Topics  . Smoking status: Never Smoker  . Smokeless tobacco: Never Used  . Alcohol use 0.0 oz/week     Comment: Social maybe twice amonth, beer  . Drug use: No  . Sexual activity: Yes    Birth control/ protection: None   Other Topics Concern  . None   Social History Narrative   HS[employer], [employer] in [place]. Occupation: [employer]. married  in Oct 2009. Son born in 2009, [place] dtrs - '07, '11. Marriage in good health.   Allergies  Allergen Reactions  . Augmentin [Amoxicillin-Pot Clavulanate] Rash  . Cephalexin Rash  . Iodine Swelling and Rash    Throat swelling after eating shrimp   Family History  Problem Relation Age of Onset  . Diabetes      grandmother  . Hypertension    . Hypertension Mother   . Anxiety disorder Mother   . Obesity Mother   . Hypertension Father   . Diabetes Maternal Aunt   . Diabetes Maternal Aunt   . Diabetes Paternal Grandmother   .  Colon polyps Paternal Uncle     x4  . Colon cancer Neg Hx     Past medical history, social, surgical and family history all reviewed in electronic medical record.  No pertanent information unless stated regarding to the chief complaint.   Review of Systems: No headache, visual changes, nausea, vomiting, diarrhea, constipation, dizziness, abdominal pain, skin rash, fevers, chills, night sweats, weight loss, swollen lymph nodes, body aches, joint swelling, muscle aches, chest pain, shortness of breath, mood changes.    Objective  Height 5\' 3"  (1.6 m), weight 229 lb (103.9 kg), last menstrual period 10/18/2016. Systems examined below as of 11/13/16   Systems examined below as of 11/13/16 General: NAD A&O x3 mood, affect normal  HEENT: Pupils equal, extraocular movements intact no nystagmus Respiratory: not short of breath at rest or  with speaking Cardiovascular: No lower extremity edema, non tender Skin: Warm dry intact with no signs of infection or rash on extremities or on axial skeleton. Abdomen: Soft nontender, no masses Neuro: Cranial nerves  intact, neurovascularly intact in all extremities with 2+ DTRs and 2+ pulses. Lymph: No lymphadenopathy appreciated today  Gait normal with good balance and coordination.  MSK: Non tender with full range of motion and good stability and symmetric strength and tone of shoulders, elbows, wrist,  hips and ankles bilaterally.  .  Back Exam:  Inspection: Unremarkable  Motion: Flexion 45 deg, Extension 25 deg, Side Bending to 35 deg bilaterally,  Rotation to 45 deg bilaterally  SLR laying: Negative  XSLR laying: Negative  Palpable tenderness: Minimal tenderness still remaining in the paraspinal musculature FABER: Negative Sensory change: Gross sensation intact to all lumbar and sacral dermatomes.  Reflexes: 2+ at both patellar tendons, 2+ at achilles tendons, Babinski's downgoing.  Strength at foot  Plantar-flexion: 5/5 Dorsi-flexion: 5/5 Eversion: 5/5 Inversion: 5/5  Leg strength  Quad: 5/5 Hamstring: 5/5 Hip flexor: 5/5 Hip abductors: 5/5  Gait unremarkable.   Knee: Left Normal to inspection with no erythema or effusion or obvious bony abnormalities. Palpation normal with no warmth, joint line tenderness, patellar tenderness, or condyle tenderness. ROM full in flexion and extension and lower leg rotation. Mild pain with stressing the MCL but no true laxity Mild positive Mcmurray's, Apley's, and Thessalonian tests. Non painful patellar compression. Patellar glide with mild crepitus. Patellar and quadriceps tendons unremarkable. Hamstring and quadriceps strength is normal.  Contralateral knee also has some mild patellofemoral symptoms but otherwise unremarkable  MSK US performed of: Left knee This study was ordered, performed, and interpreted by Charlann Boxer  D.O.  Knee: MCL has surrounding hypoechoic changes as well as increasing Doppler flow. No true tear noted. Narrowing of the patellofemoral joint noted  IMPRESSION:  MCL sprain      Impression and Recommendations:     This case required medical decision making of moderate complexity.      Note: This dictation was prepared with Dragon dictation along with smaller phrase technology. Any transcriptional errors that result from this process are unintentional.

## 2016-11-13 NOTE — Assessment & Plan Note (Signed)
Improving. Continue Effexor and the home exercises. We discussed core stability exercises. Follow-up again in 4 weeks

## 2016-11-25 ENCOUNTER — Ambulatory Visit (INDEPENDENT_AMBULATORY_CARE_PROVIDER_SITE_OTHER): Payer: BLUE CROSS/BLUE SHIELD | Admitting: Family Medicine

## 2016-11-25 ENCOUNTER — Encounter (INDEPENDENT_AMBULATORY_CARE_PROVIDER_SITE_OTHER): Payer: Self-pay

## 2016-12-08 ENCOUNTER — Ambulatory Visit (INDEPENDENT_AMBULATORY_CARE_PROVIDER_SITE_OTHER): Payer: BLUE CROSS/BLUE SHIELD | Admitting: Family Medicine

## 2016-12-08 VITALS — BP 113/73 | HR 94 | Temp 98.4°F | Ht 63.0 in | Wt 226.0 lb

## 2016-12-08 DIAGNOSIS — E559 Vitamin D deficiency, unspecified: Secondary | ICD-10-CM | POA: Diagnosis not present

## 2016-12-08 DIAGNOSIS — D508 Other iron deficiency anemias: Secondary | ICD-10-CM

## 2016-12-08 DIAGNOSIS — F3289 Other specified depressive episodes: Secondary | ICD-10-CM | POA: Diagnosis not present

## 2016-12-08 DIAGNOSIS — R7303 Prediabetes: Secondary | ICD-10-CM

## 2016-12-08 MED ORDER — METFORMIN HCL 500 MG PO TABS
500.0000 mg | ORAL_TABLET | Freq: Every day | ORAL | 0 refills | Status: DC
Start: 2016-12-08 — End: 2017-10-13

## 2016-12-08 MED ORDER — BUPROPION HCL ER (SR) 150 MG PO TB12: 150.0000 mg | ORAL_TABLET | Freq: Every morning | ORAL | 0 refills | Status: DC

## 2016-12-08 NOTE — Progress Notes (Signed)
Office: [phone]  /  Fax: [phone]   HPI:   Chief Complaint: OBESITY Deanna Schwartz is here to discuss her progress with her obesity treatment plan. She is following her eating plan approximately 80 % of the time and states she is exercising 0 minutes 0 times per week. Deanna Schwartz is not journaling as often, but is walking for exercise and doing 10,000 steps approximately 4 times per week. She has gotten off track in the last 21 weeks and would like to discuss other options.  Her weight is 226 lb (102.5 kg) today and has had a weight gain of 3 pounds over a period of 4 weeks since her last visit. She has gained 7 lbs since starting treatment with [place].  Vitamin D deficiency Deanna Schwartz has a diagnosis of vitamin D deficiency. She is currently on vit D, not yet at goal. She still notes fatigue and denies nausea, vomiting or muscle weakness.  Pre-Diabetes Deanna Schwartz has a diagnosis of prediabetes based on her elevated Hgb A1c and was informed this puts her at greater risk of developing diabetes. She is on metformin. She stopped her medications due to forgetting to take and notes increased polyphagia. She continues to work on diet and exercise to decrease risk of diabetes. She denies nausea or hypoglycemia.  Iron Deficiency Anemia Deanna Schwartz has a diagnosis of iron deficiency anemia.  She still notes fatigue, has been trying to improve with increased iron diet and is not on iron supplementation.   Depression with emotional eating behaviors Deanna Schwartz forgot to take medication and then just stopped. She notes increased emotional eating. She is struggling with emotional eating and using food for comfort to the extent that it is negatively impacting her health. She often snacks when she is not hungry. Deanna Schwartz sometimes feels she is out of control and then feels guilty that she made poor food choices. She has been working on behavior modification techniques to help reduce her emotional eating and has  been somewhat successful. She shows no sign of suicidal or homicidal ideations.  Depression screen Deanna Schwartz 2/9 08/26/2016 05/30/2016 05/30/2015  Decreased Interest 3 0 0  Down, Depressed, Hopeless 1 0 0  PHQ - 2 Score 4 0 0  Altered sleeping 1 - -  Tired, decreased energy 3 - -  Change in appetite 2 - -  Feeling bad or failure about yourself  0 - -  Trouble concentrating 0 - -  Moving slowly or fidgety/restless 0 - -  Suicidal thoughts 0 - -  PHQ-9 Score 10 - -  Some recent data might be hidden      Wt Readings from Last 500 Encounters:  12/08/16 226 lb (102.5 kg)  11/13/16 229 lb (103.9 kg)  11/10/16 223 lb (101.2 kg)  10/14/16 220 lb (99.8 kg)  10/10/16 225 lb (102.1 kg)  09/29/16 224 lb (101.6 kg)  09/16/16 222 lb (100.7 kg)  08/26/16 219 lb 6.4 oz (99.5 kg)  08/15/16 225 lb (102.1 kg)  07/07/16 226 lb (102.5 kg)  06/10/16 224 lb (101.6 kg)  05/30/16 222 lb (100.7 kg)  03/14/16 221 lb (100.2 kg)  02/27/16 224 lb (101.6 kg)  11/22/15 223 lb (101.2 kg)  10/10/15 225 lb (102.1 kg)  08/27/15 224 lb (101.6 kg)  08/17/15 221 lb (100.2 kg)  06/22/15 221 lb (100.2 kg)  05/30/15 220 lb (99.8 kg)  04/18/15 221 lb (100.2 kg)  02/21/15 221 lb (100.2 kg)  02/02/15 217 lb (98.4 kg)  01/22/15 217 lb (98.4 kg)  01/03/15 215 lb (97.5 kg)  11/13/14 217 lb 8 oz (98.7 kg)  10/04/14 212 lb 4 oz (96.3 kg)  09/22/14 214 lb 6 oz (97.2 kg)  09/14/14 212 lb 9.6 oz (96.4 kg)  07/12/14 215 lb (97.5 kg)  06/20/14 213 lb (96.6 kg)  05/09/14 214 lb (97.1 kg)  04/07/14 214 lb (97.1 kg)  02/15/14 212 lb 9.6 oz (96.4 kg)  01/25/14 212 lb (96.2 kg)  11/19/13 211 lb (95.7 kg)  11/18/13 213 lb (96.6 kg)  11/05/13 211 lb (95.7 kg)  06/30/13 206 lb (93.4 kg)  05/26/13 200 lb 12.8 oz (91.1 kg)  05/13/13 200 lb (90.7 kg)  11/13/12 203 lb (92.1 kg)  10/05/12 201 lb 12.8 oz (91.5 kg)  04/29/12 191 lb 9.6 oz (86.9 kg)  04/21/12 197 lb (89.4 kg)  01/19/12 188 lb (85.3 kg)  01/13/12 188 lb (85.3 kg)   01/13/12 188 lb (85.3 kg)  01/09/12 190 lb (86.2 kg)  12/29/11 190 lb 9.6 oz (86.5 kg)  08/28/11 193 lb (87.5 kg)  03/24/11 201 lb (91.2 kg)  02/27/11 196 lb (88.9 kg)  10/11/10 193 lb (87.5 kg)  09/04/10 194 lb (88 kg)  07/10/10 197 lb (89.4 kg)  06/22/09 184 lb (83.5 kg)  05/07/09 178 lb 12.8 oz (81.1 kg)  03/27/09 175 lb (79.4 kg)  03/09/09 176 lb 3.2 oz (79.9 kg)  11/01/08 183 lb (83 kg)  08/07/08 186 lb (84.4 kg)  05/26/08 194 lb (88 kg)  05/03/08 195 lb (88.5 kg)  04/07/08 194 lb 2.1 oz (88.1 kg)  03/29/08 197 lb (89.4 kg)  03/03/08 201 lb (91.2 kg)  01/11/08 201 lb (91.2 kg)  11/25/07 208 lb (94.3 kg)  10/20/07 202 lb (91.6 kg)  01/01/07 182 lb (82.6 kg)     ALLERGIES: Allergies  Allergen Reactions  . Augmentin [Amoxicillin-Pot Clavulanate] Rash  . Cephalexin Rash  . Iodine Swelling and Rash    Throat swelling after eating shrimp    MEDICATIONS: Current Outpatient Prescriptions on File Prior to Visit  Medication Sig Dispense Refill  . Albuterol Sulfate (PROAIR RESPICLICK) 767 (90 Base) MCG/ACT AEPB Inhale 1-2 puffs into the lungs 4 (four) times daily as needed. 1 each 11  . Ascorbic Acid (VITAMIN C) 1000 MG tablet Take 1,000 mg by mouth daily.    . beclomethasone (QVAR) 40 MCG/ACT inhaler Inhale 2 puffs into the lungs 2 (two) times daily. 1 Inhaler 12  . escitalopram (LEXAPRO) 10 MG tablet Take 1 tablet (10 mg total) by mouth daily. 30 tablet 0  . esomeprazole (NEXIUM) 40 MG capsule Take 40 mg by mouth daily at 12 noon.    . fluticasone (FLONASE) 50 MCG/ACT nasal spray Place 2 sprays into both nostrils daily. 16 g 6  . venlafaxine XR (EFFEXOR XR) 37.5 MG 24 hr capsule Take 1 capsule (37.5 mg total) by mouth daily with breakfast. 30 capsule 1  . Vitamin D, Ergocalciferol, (DRISDOL) 50000 units CAPS capsule Take 1 capsule (50,000 Units total) by mouth every 7 (seven) days. 4 capsule 0   No current facility-administered medications on file prior to visit.      PAST MEDICAL HISTORY: Past Medical History:  Diagnosis Date  . Allergic rhinitis   . Anemia   . Anxiety   . Asthma   . 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 Chronic constipation   . Constipation   . Depression   . GERD (gastroesophageal reflux disease)   .  H. pylori infection 2008   Hx of tx + serology  . Iron deficiency anemia 08/06/2011   Hgb 10.2 and MCV 79 on health screen labs   . Irritable bowel syndrome   . Obesity   . Palpitations   . Panic attack   . Shortness of breath   . Ulcer ([place])     PAST SURGICAL HISTORY: Past Surgical History:  Procedure Laterality Date  . FLEXIBLE SIGMOIDOSCOPY  04/14/2008   normal  . NASAL SINUS SURGERY    . UPPER GASTROINTESTINAL ENDOSCOPY      SOCIAL HISTORY: Social History  Substance Use Topics  . Smoking status: Never Smoker  . Smokeless tobacco: Never Used  . Alcohol use 0.0 oz/week     Comment: Social maybe twice amonth, beer    FAMILY HISTORY: Family History  Problem Relation Age of Onset  . Diabetes      grandmother  . Hypertension    . Hypertension Mother   . Anxiety disorder Mother   . Obesity Mother   . Hypertension Father   . Diabetes Maternal Aunt   . Diabetes Maternal Aunt   . Diabetes Paternal Grandmother   . Colon polyps Paternal Uncle     x4  . Colon cancer Neg Hx     ROS: Review of Systems  Constitutional: Positive for malaise/fatigue.  Gastrointestinal: Negative for nausea and vomiting.  Musculoskeletal:       Negative muscle weakness   Endo/Heme/Allergies:       Polyphagia Negative hypoglycemia  Psychiatric/Behavioral: Negative for suicidal ideas.    PHYSICAL EXAM: Blood pressure 113/73, pulse 94, temperature 98.4 F (36.9 C), temperature source Oral, height 5\' 3"  (1.6 m), weight 226 lb (102.5 kg), SpO2 100 %. Body mass index is 40.03 kg/m. Physical Exam  Constitutional: She is oriented to person, place, and time. She appears  well-developed and well-nourished.  Cardiovascular: Normal rate.   Pulmonary/Chest: Effort normal.  Musculoskeletal: Normal range of motion.  Neurological: She is oriented to person, place, and time.  Skin: Skin is warm and dry.  Vitals reviewed.   RECENT LABS AND TESTS: BMET    Component Value Date/Time   NA 141 08/26/2016 1145   K 4.3 08/26/2016 1145   CL 100 08/26/2016 1145   CO2 26 08/26/2016 1145   GLUCOSE 86 08/26/2016 1145   GLUCOSE 87 08/18/2016 0804   GLUCOSE 97 07/27/2006 1219   BUN 13 08/26/2016 1145   CREATININE 0.73 08/26/2016 1145   CREATININE 0.74 05/13/2013 1024   CALCIUM 9.7 08/26/2016 1145   GFRNONAA 108 08/26/2016 1145   GFRAA 124 08/26/2016 1145   Lab Results  Component Value Date   HGBA1C 5.3 08/26/2016   HGBA1C 5.5 10/10/2015   HGBA1C 5.2 04/21/2012   Lab Results  Component Value Date   INSULIN 13.0 08/26/2016   CBC    Component Value Date/Time   WBC 6.2 08/26/2016 1145   WBC 8.6 08/18/2016 0804   RBC 4.23 08/26/2016 1145   RBC 4.35 08/18/2016 0804   HGB 11.2 (L) 08/18/2016 0804   HCT 33.3 (L) 08/26/2016 1145   PLT 250.0 08/18/2016 0804   MCV 79 08/26/2016 1145   MCH 25.5 (L) 08/26/2016 1145   MCH 25.0 (L) 01/03/2014 1916   MCHC 32.4 08/26/2016 1145   MCHC 33.2 08/18/2016 0804   RDW 15.1 08/26/2016 1145   LYMPHSABS 1.9 08/26/2016 1145   MONOABS 0.5 08/18/2016 0804   EOSABS 0.9 (H) 08/26/2016 1145   BASOSABS 0.0 08/26/2016 1145  Iron/TIBC/Ferritin/ %Sat    Component Value Date/Time   IRON 61 08/26/2016 1145   TIBC 415 08/26/2016 1145   FERRITIN 18 08/26/2016 1145   IRONPCTSAT 15 08/26/2016 1145   IRONPCTSAT 11 (L) 03/29/2015 0950   Lipid Panel     Component Value Date/Time   CHOL 184 08/26/2016 1145   TRIG 131 08/26/2016 1145   HDL 52 08/26/2016 1145   CHOLHDL 4 08/18/2016 0804   VLDL 29.8 08/18/2016 0804   LDLCALC 106 (H) 08/26/2016 1145   Hepatic Function Panel     Component Value Date/Time   PROT 7.7 08/26/2016  1145   ALBUMIN 4.0 08/26/2016 1145   AST 22 08/26/2016 1145   ALT 16 08/26/2016 1145   ALKPHOS 72 08/26/2016 1145   BILITOT 0.3 08/26/2016 1145   BILIDIR 0.1 08/18/2016 0804      Component Value Date/Time   TSH 1.240 08/26/2016 1145   TSH 2.33 08/18/2016 0804   TSH 2.04 08/27/2015 0829    ASSESSMENT AND PLAN: Other depression - Plan: buPROPion (WELLBUTRIN SR) 150 MG 12 hr tablet  Prediabetes - Plan: metFORMIN (GLUCOPHAGE) 500 MG tablet, Comprehensive metabolic panel, Hemoglobin A1c, Insulin, random, Lipid Panel With LDL/HDL Ratio  Vitamin D deficiency - Plan: VITAMIN D 25 Hydroxy (Vit-D Deficiency, Fractures)  Other iron deficiency anemia - Plan: CBC With Differential  Morbid obesity ([place])  PLAN: Vitamin D Deficiency Deanna Schwartz was informed that low vitamin D levels contributes to fatigue and are associated with obesity, breast, and colon cancer. She agrees to continue to take prescription Vit D @50 ,000 IU every week and we will check labs and will follow up for routine testing of vitamin D, at least 2-3 times per year. She was informed of the risk of over-replacement of vitamin D and agrees to not increase her dose unless he discusses this with [place] first. She agrees to follow up with our clinic in 2 weeks.  Pre-Diabetes Deanna Schwartz will continue to work on weight loss, exercise, and decreasing simple carbohydrates in her diet to help decrease the risk of diabetes. We dicussed metformin including benefits and risks. She was informed that eating too many simple carbohydrates or too many calories at one sitting increases the likelihood of GI side effects. Deanna Schwartz agrees to re-start metformin for now and a prescription was written today for 1 month refill. We will check labs and Deanna Schwartz agreed to follow up with [place] as directed to monitor her progress.  Iron Deficiency Anemia The diagnosis of Iron deficiency anemia was discussed with Deanna Schwartz and was explained in detail. She was given  suggestions of iron rich foods and and iron supplement was not prescribed. We will check labs and follow. We may need to start a supplement if not improved.  Depression with Emotional Eating Behaviors We discussed behavior modification techniques today to help Deanna Schwartz deal with her emotional eating and depression. She has agreed to continue to take Wellbutrin SR 150 mg qd, we will refill for 1 month and agreed to follow up as directed. We discussed cognitive behavioral therapy to help with emotional eating.  Obesity Deanna Schwartz is currently in the action stage of change. As such, her goal is to continue with weight loss efforts She has agreed to change to follow a lower carbohydrate, vegetable and lean protein rich diet plan Deanna Schwartz has been instructed to work up to a goal of 150 minutes of combined cardio and strengthening exercise per week for weight loss and overall health benefits. We discussed the following Behavioral Modification  Stratagies today: increasing lean protein intake, decreasing simple carbohydrates , increasing lower sugar fruits and increasing fiber rich foods  Deanna Schwartz has agreed to follow up with our clinic in 2 weeks. She was informed of the importance of frequent follow up visits to maximize her success with intensive lifestyle modifications for her multiple health conditions.  I, Doreene Nest, am acting as scribe for Dennard Nip, MD  I have reviewed the above documentation for accuracy and completeness, and I agree with the above. -Dennard Nip, MD

## 2016-12-16 ENCOUNTER — Other Ambulatory Visit (INDEPENDENT_AMBULATORY_CARE_PROVIDER_SITE_OTHER): Payer: BLUE CROSS/BLUE SHIELD

## 2016-12-16 DIAGNOSIS — F411 Generalized anxiety disorder: Secondary | ICD-10-CM

## 2016-12-16 DIAGNOSIS — R7989 Other specified abnormal findings of blood chemistry: Secondary | ICD-10-CM

## 2016-12-16 LAB — COMPREHENSIVE METABOLIC PANEL
ALBUMIN: 3.8 g/dL (ref 3.5–5.2)
ALT: 15 U/L (ref 0–35)
AST: 17 U/L (ref 0–37)
Alkaline Phosphatase: 68 U/L (ref 39–117)
BILIRUBIN TOTAL: 0.3 mg/dL (ref 0.2–1.2)
BUN: 12 mg/dL (ref 6–23)
CALCIUM: 9 mg/dL (ref 8.4–10.5)
CHLORIDE: 101 meq/L (ref 96–112)
CO2: 29 meq/L (ref 19–32)
Creatinine, Ser: 0.85 mg/dL (ref 0.40–1.20)
GFR: 97.98 mL/min (ref >60.00)
Glucose, Bld: 90 mg/dL (ref 70–99)
Potassium: 3.9 mEq/L (ref 3.5–5.1)
Sodium: 137 mEq/L (ref 135–145)
Total Protein: 7.6 g/dL (ref 6.0–8.3)

## 2016-12-16 LAB — CBC WITH DIFFERENTIAL/PLATELET
BASOS PCT: 0.7 % (ref 0.0–3.0)
Basophils Absolute: 0.1 10*3/uL (ref 0.0–0.1)
Eosinophils Absolute: 0.9 10*3/uL — ABNORMAL HIGH (ref 0.0–0.7)
Eosinophils Relative: 10.3 % — ABNORMAL HIGH (ref 0.0–5.0)
HEMATOCRIT: 33.8 % — AB (ref 36.0–46.0)
Hemoglobin: 10.6 g/dL — ABNORMAL LOW (ref 12.0–15.0)
LYMPHS PCT: 27.9 % (ref 12.0–46.0)
Lymphs Abs: 2.3 10*3/uL (ref 0.7–4.0)
MCHC: 31.5 g/dL (ref 30.0–36.0)
MCV: 72.4 fl — AB (ref 78.0–100.0)
MONOS PCT: 7.7 % (ref 3.0–12.0)
Monocytes Absolute: 0.6 10*3/uL (ref 0.1–1.0)
NEUTROS ABS: 4.5 10*3/uL (ref 1.4–7.7)
Neutrophils Relative %: 53.4 % (ref 43.0–77.0)
PLATELETS: 304 10*3/uL (ref 150.0–400.0)
RBC: 4.67 Mil/uL (ref 3.87–5.11)
RDW: 16.2 % — AB (ref 11.5–15.5)
WBC: 8.4 10*3/uL (ref 4.0–10.5)

## 2016-12-16 LAB — LDL CHOLESTEROL, DIRECT: LDL DIRECT: 106 mg/dL

## 2016-12-16 LAB — LIPID PANEL
CHOL/HDL RATIO: 4
Cholesterol: 175 mg/dL (ref 0–200)
HDL: 43.4 mg/dL (ref >39.00)
NONHDL: 131.61
Triglycerides: 245 mg/dL — ABNORMAL HIGH (ref 0.0–149.0)
VLDL: 49 mg/dL — ABNORMAL HIGH (ref 0.0–40.0)

## 2016-12-16 LAB — HEMOGLOBIN A1C: HEMOGLOBIN A1C: 5.7 % (ref 4.6–6.5)

## 2016-12-16 LAB — VITAMIN D 25 HYDROXY (VIT D DEFICIENCY, FRACTURES): VITD: 27.06 ng/mL — ABNORMAL LOW (ref 30.00–100.00)

## 2016-12-17 LAB — INSULIN, RANDOM: INSULIN: 14.6 u[IU]/mL (ref 2.0–19.6)

## 2016-12-24 ENCOUNTER — Encounter (INDEPENDENT_AMBULATORY_CARE_PROVIDER_SITE_OTHER): Payer: Self-pay | Admitting: Family Medicine

## 2016-12-25 ENCOUNTER — Ambulatory Visit (INDEPENDENT_AMBULATORY_CARE_PROVIDER_SITE_OTHER): Payer: BLUE CROSS/BLUE SHIELD | Admitting: Family Medicine

## 2016-12-25 VITALS — BP 108/72 | HR 97 | Temp 98.4°F | Ht 63.0 in | Wt 227.0 lb

## 2016-12-25 DIAGNOSIS — E559 Vitamin D deficiency, unspecified: Secondary | ICD-10-CM | POA: Diagnosis not present

## 2016-12-25 MED ORDER — VITAMIN D (ERGOCALCIFEROL) 1.25 MG (50000 UNIT) PO CAPS: 50000.0000 [IU] | ORAL_CAPSULE | ORAL | 0 refills | Status: DC

## 2016-12-25 NOTE — Progress Notes (Signed)
Office: [phone]  /  Fax: [phone]   HPI:   Chief Complaint: OBESITY Deanna Schwartz is here to discuss her progress with her obesity treatment plan. She is following her eating plan approximately 50 % of the time and states she is exercising 0 minutes 0 times per week. Deanna Schwartz is still struggling to follow any of our diet plans. Instead she is trying to eat healthy but continues to gain weight. She has questions about weight loss surgery and would like more information. Her weight is 227 lb (103 kg) today and has had a weight gain of 1 lb over a period of 2 weeks since her last visit. She has gained 3 lbs since starting treatment with [place].  Vitamin D deficiency Deanna Schwartz has a diagnosis of vitamin D deficiency. She is currently stable on vit D and denies nausea, vomiting or muscle weakness.  Wt Readings from Last 500 Encounters:  12/25/16 227 lb (103 kg)  12/08/16 226 lb (102.5 kg)  11/13/16 229 lb (103.9 kg)  11/10/16 223 lb (101.2 kg)  10/14/16 220 lb (99.8 kg)  10/10/16 225 lb (102.1 kg)  09/29/16 224 lb (101.6 kg)  09/16/16 222 lb (100.7 kg)  08/26/16 219 lb 6.4 oz (99.5 kg)  08/15/16 225 lb (102.1 kg)  07/07/16 226 lb (102.5 kg)  06/10/16 224 lb (101.6 kg)  05/30/16 222 lb (100.7 kg)  03/14/16 221 lb (100.2 kg)  02/27/16 224 lb (101.6 kg)  11/22/15 223 lb (101.2 kg)  10/10/15 225 lb (102.1 kg)  08/27/15 224 lb (101.6 kg)  08/17/15 221 lb (100.2 kg)  06/22/15 221 lb (100.2 kg)  05/30/15 220 lb (99.8 kg)  04/18/15 221 lb (100.2 kg)  02/21/15 221 lb (100.2 kg)  02/02/15 217 lb (98.4 kg)  01/22/15 217 lb (98.4 kg)  01/03/15 215 lb (97.5 kg)  11/13/14 217 lb 8 oz (98.7 kg)  10/04/14 212 lb 4 oz (96.3 kg)  09/22/14 214 lb 6 oz (97.2 kg)  09/14/14 212 lb 9.6 oz (96.4 kg)  07/12/14 215 lb (97.5 kg)  06/20/14 213 lb (96.6 kg)  05/09/14 214 lb (97.1 kg)  04/07/14 214 lb (97.1 kg)  02/15/14 212 lb 9.6 oz (96.4 kg)  01/25/14 212 lb (96.2 kg)  11/19/13 211 lb (95.7  kg)  11/18/13 213 lb (96.6 kg)  11/05/13 211 lb (95.7 kg)  06/30/13 206 lb (93.4 kg)  05/26/13 200 lb 12.8 oz (91.1 kg)  05/13/13 200 lb (90.7 kg)  11/13/12 203 lb (92.1 kg)  10/05/12 201 lb 12.8 oz (91.5 kg)  04/29/12 191 lb 9.6 oz (86.9 kg)  04/21/12 197 lb (89.4 kg)  01/19/12 188 lb (85.3 kg)  01/13/12 188 lb (85.3 kg)  01/13/12 188 lb (85.3 kg)  01/09/12 190 lb (86.2 kg)  12/29/11 190 lb 9.6 oz (86.5 kg)  08/28/11 193 lb (87.5 kg)  03/24/11 201 lb (91.2 kg)  02/27/11 196 lb (88.9 kg)  10/11/10 193 lb (87.5 kg)  09/04/10 194 lb (88 kg)  07/10/10 197 lb (89.4 kg)  06/22/09 184 lb (83.5 kg)  05/07/09 178 lb 12.8 oz (81.1 kg)  03/27/09 175 lb (79.4 kg)  03/09/09 176 lb 3.2 oz (79.9 kg)  11/01/08 183 lb (83 kg)  08/07/08 186 lb (84.4 kg)  05/26/08 194 lb (88 kg)  05/03/08 195 lb (88.5 kg)  04/07/08 194 lb 2.1 oz (88.1 kg)  03/29/08 197 lb (89.4 kg)  03/03/08 201 lb (91.2 kg)  01/11/08 201 lb (91.2 kg)  11/25/07 208  lb (94.3 kg)  10/20/07 202 lb (91.6 kg)  01/01/07 182 lb (82.6 kg)     ALLERGIES: Allergies  Allergen Reactions  . Augmentin [Amoxicillin-Pot Clavulanate] Rash  . Cephalexin Rash  . Iodine Swelling and Rash    Throat swelling after eating shrimp    MEDICATIONS: Current Outpatient Prescriptions on File Prior to Visit  Medication Sig Dispense Refill  . Albuterol Sulfate (PROAIR RESPICLICK) 562 (90 Base) MCG/ACT AEPB Inhale 1-2 puffs into the lungs 4 (four) times daily as needed. 1 each 11  . Ascorbic Acid (VITAMIN C) 1000 MG tablet Take 1,000 mg by mouth daily.    . beclomethasone (QVAR) 40 MCG/ACT inhaler Inhale 2 puffs into the lungs 2 (two) times daily. 1 Inhaler 12  . buPROPion (WELLBUTRIN SR) 150 MG 12 hr tablet Take 1 tablet (150 mg total) by mouth every morning. 30 tablet 0  . escitalopram (LEXAPRO) 10 MG tablet Take 1 tablet (10 mg total) by mouth daily. 30 tablet 0  . esomeprazole (NEXIUM) 40 MG capsule Take 40 mg by mouth daily at 12 noon.      . fluticasone (FLONASE) 50 MCG/ACT nasal spray Place 2 sprays into both nostrils daily. 16 g 6  . metFORMIN (GLUCOPHAGE) 500 MG tablet Take 1 tablet (500 mg total) by mouth daily with breakfast. 30 tablet 0  . venlafaxine XR (EFFEXOR XR) 37.5 MG 24 hr capsule Take 1 capsule (37.5 mg total) by mouth daily with breakfast. 30 capsule 1   No current facility-administered medications on file prior to visit.     PAST MEDICAL HISTORY: Past Medical History:  Diagnosis Date  . Allergic rhinitis   . Anemia   . Anxiety   . Asthma   . 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 Chronic constipation   . Constipation   . Depression   . GERD (gastroesophageal reflux disease)   . H. pylori infection 2008   Hx of tx + serology  . Iron deficiency anemia 08/06/2011   Hgb 10.2 and MCV 79 on health screen labs   . Irritable bowel syndrome   . Obesity   . Palpitations   . Panic attack   . Shortness of breath   . Ulcer ([place])     PAST SURGICAL HISTORY: Past Surgical History:  Procedure Laterality Date  . FLEXIBLE SIGMOIDOSCOPY  04/14/2008   normal  . NASAL SINUS SURGERY    . UPPER GASTROINTESTINAL ENDOSCOPY      SOCIAL HISTORY: Social History  Substance Use Topics  . Smoking status: Never Smoker  . Smokeless tobacco: Never Used  . Alcohol use 0.0 oz/week     Comment: Social maybe twice amonth, beer    FAMILY HISTORY: Family History  Problem Relation Age of Onset  . Diabetes      grandmother  . Hypertension    . Hypertension Mother   . Anxiety disorder Mother   . Obesity Mother   . Hypertension Father   . Diabetes Maternal Aunt   . Diabetes Maternal Aunt   . Diabetes Paternal Grandmother   . Colon polyps Paternal Uncle     x4  . Colon cancer Neg Hx     ROS: Review of Systems  Constitutional: Negative for weight loss.  Gastrointestinal: Negative for nausea and vomiting.  Musculoskeletal:       Negative muscle weakness     PHYSICAL  EXAM: Blood pressure 108/72, pulse 97, temperature 98.4 F (36.9 C), temperature source  Oral, height 5\' 3"  (1.6 m), weight 227 lb (103 kg), SpO2 97 %. Body mass index is 40.21 kg/m. Physical Exam  Constitutional: She is oriented to person, place, and time. She appears well-developed and well-nourished.  Cardiovascular: Normal rate.   Pulmonary/Chest: Effort normal.  Musculoskeletal: Normal range of motion.  Neurological: She is oriented to person, place, and time.  Skin: Skin is warm and dry.  Psychiatric: She has a normal mood and affect. Her behavior is normal.  Vitals reviewed.   RECENT LABS AND TESTS: BMET    Component Value Date/Time   NA 137 12/16/2016 0741   NA 141 08/26/2016 1145   K 3.9 12/16/2016 0741   CL 101 12/16/2016 0741   CO2 29 12/16/2016 0741   GLUCOSE 90 12/16/2016 0741   GLUCOSE 97 07/27/2006 1219   BUN 12 12/16/2016 0741   BUN 13 08/26/2016 1145   CREATININE 0.85 12/16/2016 0741   CREATININE 0.74 05/13/2013 1024   CALCIUM 9.0 12/16/2016 0741   GFRNONAA 108 08/26/2016 1145   GFRAA 124 08/26/2016 1145   Lab Results  Component Value Date   HGBA1C 5.7 12/16/2016   HGBA1C 5.3 08/26/2016   HGBA1C 5.5 10/10/2015   HGBA1C 5.2 04/21/2012   Lab Results  Component Value Date   INSULIN 13.0 08/26/2016   CBC    Component Value Date/Time   WBC 8.4 12/16/2016 0741   RBC 4.67 12/16/2016 0741   HGB 10.6 (L) 12/16/2016 0741   HCT 33.8 (L) 12/16/2016 0741   HCT 33.3 (L) 08/26/2016 1145   PLT 304.0 12/16/2016 0741   MCV 72.4 (L) 12/16/2016 0741   MCV 79 08/26/2016 1145   MCH 25.5 (L) 08/26/2016 1145   MCH 25.0 (L) 01/03/2014 1916   MCHC 31.5 12/16/2016 0741   RDW 16.2 (H) 12/16/2016 0741   RDW 15.1 08/26/2016 1145   LYMPHSABS 2.3 12/16/2016 0741   LYMPHSABS 1.9 08/26/2016 1145   MONOABS 0.6 12/16/2016 0741   EOSABS 0.9 (H) 12/16/2016 0741   EOSABS 0.9 (H) 08/26/2016 1145   BASOSABS 0.1 12/16/2016 0741   BASOSABS 0.0 08/26/2016 1145    Iron/TIBC/Ferritin/ %Sat    Component Value Date/Time   IRON 61 08/26/2016 1145   TIBC 415 08/26/2016 1145   FERRITIN 18 08/26/2016 1145   IRONPCTSAT 15 08/26/2016 1145   IRONPCTSAT 11 (L) 03/29/2015 0950   Lipid Panel     Component Value Date/Time   CHOL 175 12/16/2016 0741   CHOL 184 08/26/2016 1145   TRIG 245.0 (H) 12/16/2016 0741   HDL 43.40 12/16/2016 0741   HDL 52 08/26/2016 1145   CHOLHDL 4 12/16/2016 0741   VLDL 49.0 (H) 12/16/2016 0741   LDLCALC 106 (H) 08/26/2016 1145   LDLDIRECT 106.0 12/16/2016 0741   Hepatic Function Panel     Component Value Date/Time   PROT 7.6 12/16/2016 0741   PROT 7.7 08/26/2016 1145   ALBUMIN 3.8 12/16/2016 0741   ALBUMIN 4.0 08/26/2016 1145   AST 17 12/16/2016 0741   ALT 15 12/16/2016 0741   ALKPHOS 68 12/16/2016 0741   BILITOT 0.3 12/16/2016 0741   BILITOT 0.3 08/26/2016 1145   BILIDIR 0.1 08/18/2016 0804      Component Value Date/Time   TSH 1.240 08/26/2016 1145   TSH 2.33 08/18/2016 0804   TSH 2.04 08/27/2015 0829    ASSESSMENT AND PLAN: Vitamin D deficiency - Plan: Vitamin D, Ergocalciferol, (DRISDOL) 50000 units CAPS capsule  Morbid obesity ([place])  PLAN:  Vitamin D Deficiency Nancey was  informed that low vitamin D levels contributes to fatigue and are associated with obesity, breast, and colon cancer. She agrees to continue to take prescription Vit D @50 ,000 IU every week, we will refill for 1 month and will follow up for routine testing of vitamin D, at least 2-3 times per year. She was informed of the risk of over-replacement of vitamin D and agrees to not increase her dose unless he discusses this with [place] first. Beena agrees to follow up with our clinic in 2 weeks.  We spent > than 50% of the 30 minute visit on the counseling as documented in the note.  Obesity Jesselyn was advised about various weight loss surgery options but reminded her that the reasons she is struggling to lose weight will not go away  with weight loss surgery. She was encouraged to stop trying to do this her way and instead follow my advice. Uncertain if she is ready to change. As such, she would like to try to lose weight but my goal is to help her prevent weight gain until she is ready to follow my advice more closely. She has agreed to change to keep a food journal with 400 to 550 calories and 35 grams of protein daily and follow the Category 2 plan Nikkia has been instructed to work up to a goal of 150 minutes of combined cardio and strengthening exercise per week for weight loss and overall health benefits. We discussed the following Behavioral Modification Stratagies today: increasing lean protein intake, decreasing simple carbohydrates , increasing lower sugar fruits, increasing fiber rich foods and work on meal planning and easy cooking plans  Arthurine has agreed to follow up with our clinic in 2 weeks. She was informed of the importance of frequent follow up visits to maximize her success with intensive lifestyle modifications for her multiple health conditions.  I, Doreene Nest, am acting as scribe for Dennard Nip, MD  I have reviewed the above documentation for accuracy and completeness, and I agree with the above. -Dennard Nip, MD

## 2016-12-31 ENCOUNTER — Ambulatory Visit: Payer: BLUE CROSS/BLUE SHIELD | Admitting: Psychology

## 2016-12-31 ENCOUNTER — Ambulatory Visit (INDEPENDENT_AMBULATORY_CARE_PROVIDER_SITE_OTHER): Payer: BLUE CROSS/BLUE SHIELD | Admitting: Psychology

## 2016-12-31 DIAGNOSIS — F40248 Other situational type phobia: Secondary | ICD-10-CM

## 2017-01-06 ENCOUNTER — Ambulatory Visit (INDEPENDENT_AMBULATORY_CARE_PROVIDER_SITE_OTHER): Payer: BLUE CROSS/BLUE SHIELD | Admitting: Psychology

## 2017-01-06 DIAGNOSIS — F411 Generalized anxiety disorder: Secondary | ICD-10-CM

## 2017-01-08 ENCOUNTER — Ambulatory Visit (INDEPENDENT_AMBULATORY_CARE_PROVIDER_SITE_OTHER): Payer: BLUE CROSS/BLUE SHIELD | Admitting: Family Medicine

## 2017-01-14 MED FILL — VIT D2 1.25 MG (50,000 UNIT: 1.25 MG | 28 days supply | Qty: 4 | Fill #0

## 2017-01-20 ENCOUNTER — Ambulatory Visit: Payer: Self-pay | Admitting: Psychology

## 2017-01-29 ENCOUNTER — Ambulatory Visit (INDEPENDENT_AMBULATORY_CARE_PROVIDER_SITE_OTHER): Payer: BLUE CROSS/BLUE SHIELD | Admitting: Psychology

## 2017-01-29 DIAGNOSIS — F411 Generalized anxiety disorder: Secondary | ICD-10-CM

## 2017-02-03 ENCOUNTER — Ambulatory Visit: Payer: Self-pay | Admitting: Psychology

## 2017-03-19 ENCOUNTER — Encounter: Payer: Self-pay | Admitting: Internal Medicine

## 2017-03-19 ENCOUNTER — Other Ambulatory Visit: Payer: Self-pay | Admitting: Internal Medicine

## 2017-03-19 DIAGNOSIS — R42 Dizziness and giddiness: Secondary | ICD-10-CM

## 2017-03-24 ENCOUNTER — Other Ambulatory Visit (INDEPENDENT_AMBULATORY_CARE_PROVIDER_SITE_OTHER): Payer: BLUE CROSS/BLUE SHIELD

## 2017-03-24 DIAGNOSIS — R42 Dizziness and giddiness: Secondary | ICD-10-CM

## 2017-03-24 LAB — CBC WITH DIFFERENTIAL/PLATELET
BASOS ABS: 0 10*3/uL (ref 0.0–0.1)
Basophils Relative: 0.6 % (ref 0.0–3.0)
Eosinophils Absolute: 0.5 10*3/uL (ref 0.0–0.7)
Eosinophils Relative: 6.6 % — ABNORMAL HIGH (ref 0.0–5.0)
HEMATOCRIT: 31.5 % — AB (ref 36.0–46.0)
HEMOGLOBIN: 10 g/dL — AB (ref 12.0–15.0)
LYMPHS PCT: 25.8 % (ref 12.0–46.0)
Lymphs Abs: 1.9 10*3/uL (ref 0.7–4.0)
MCHC: 31.9 g/dL (ref 30.0–36.0)
MCV: 70.1 fl — ABNORMAL LOW (ref 78.0–100.0)
Monocytes Absolute: 0.5 10*3/uL (ref 0.1–1.0)
Monocytes Relative: 7.3 % (ref 3.0–12.0)
Neutro Abs: 4.3 10*3/uL (ref 1.4–7.7)
Neutrophils Relative %: 59.7 % (ref 43.0–77.0)
Platelets: 278 10*3/uL (ref 150.0–400.0)
RBC: 4.49 Mil/uL (ref 3.87–5.11)
RDW: 15.5 % (ref 11.5–15.5)
WBC: 7.2 10*3/uL (ref 4.0–10.5)

## 2017-03-24 LAB — VITAMIN B12: Vitamin B-12: 140 pg/mL — ABNORMAL LOW (ref 211–911)

## 2017-04-09 ENCOUNTER — Other Ambulatory Visit: Payer: Self-pay | Admitting: Internal Medicine

## 2017-04-21 MED FILL — VENTOLIN HFA 90 MCG INHALER: 108 (90 BAS | 25 days supply | Qty: 18 | Fill #0

## 2017-04-24 ENCOUNTER — Ambulatory Visit (INDEPENDENT_AMBULATORY_CARE_PROVIDER_SITE_OTHER): Payer: BLUE CROSS/BLUE SHIELD | Admitting: Internal Medicine

## 2017-04-24 ENCOUNTER — Encounter: Payer: Self-pay | Admitting: Internal Medicine

## 2017-04-24 DIAGNOSIS — F411 Generalized anxiety disorder: Secondary | ICD-10-CM

## 2017-04-24 DIAGNOSIS — E538 Deficiency of other specified B group vitamins: Secondary | ICD-10-CM

## 2017-04-24 DIAGNOSIS — E559 Vitamin D deficiency, unspecified: Secondary | ICD-10-CM

## 2017-04-24 DIAGNOSIS — F410 Panic disorder [episodic paroxysmal anxiety] without agoraphobia: Secondary | ICD-10-CM

## 2017-04-24 DIAGNOSIS — E669 Obesity, unspecified: Secondary | ICD-10-CM | POA: Diagnosis not present

## 2017-04-24 DIAGNOSIS — Z6839 Body mass index (BMI) 39.0-39.9, adult: Secondary | ICD-10-CM

## 2017-04-24 MED ORDER — CYANOCOBALAMIN 1000 MCG/ML IJ SOLN
1000.0000 ug | Freq: Once | INTRAMUSCULAR | Status: AC
  Administered 2017-04-24: 1000 ug via INTRAMUSCULAR

## 2017-04-24 MED ORDER — CYANOCOBALAMIN 1000 MCG/ML IJ SOLN: INTRAMUSCULAR | 10 refills | Status: DC

## 2017-04-24 MED ORDER — VITAMIN D3 50 MCG (2000 UT) PO CAPS: 2000.0000 [IU] | ORAL_CAPSULE | Freq: Every day | ORAL | 3 refills | Status: DC

## 2017-04-24 MED ORDER — "SYRINGE/NEEDLE (DISP) 30G X 1/2"" 1 ML MISC": 1.0000 | 5 refills | Status: DC

## 2017-04-24 MED FILL — BD TB SYRINGE 27GX1/2: 27G X 1/2" | 30 days supply | Qty: 10 | Fill #0

## 2017-04-24 MED FILL — BD TB SYRINGE 27GX1/2": 27G X 1/2" | 30 days supply | Qty: 10 | Fill #0

## 2017-04-24 MED FILL — CYANOCOBALAMIN 1,000 MCG/ML: 1000 | 30 days supply | Qty: 10 | Fill #0

## 2017-04-24 NOTE — Assessment & Plan Note (Signed)
Start B12

## 2017-04-24 NOTE — Assessment & Plan Note (Signed)
Re-start w/Dr Leafy Ro

## 2017-04-24 NOTE — Assessment & Plan Note (Signed)
Cont w/Behav Med

## 2017-04-24 NOTE — Addendum Note (Signed)
Addended by: Aviva Signs M on: 04/24/2017 11:06 AM   Modules accepted: Orders

## 2017-04-24 NOTE — Assessment & Plan Note (Signed)
Start Vit D daily

## 2017-04-24 NOTE — Progress Notes (Signed)
Subjective:  Patient ID: Deanna Schwartz, female    DOB: 01-12-1981  Age: 36 y.o. MRN: [id number]  CC: No chief complaint on file.   HPI [employer] presents for Vit B12 and Vit D def C/o obesity - was seeing Dr Leafy Ro  Outpatient Medications Prior to Visit  Medication Sig Dispense Refill  . Albuterol Sulfate (PROAIR RESPICLICK) 283 (90 Base) MCG/ACT AEPB Inhale 1-2 puffs into the lungs 4 (four) times daily as needed. 1 each 11  . Ascorbic Acid (VITAMIN C) 1000 MG tablet Take 1,000 mg by mouth daily.    . beclomethasone (QVAR) 40 MCG/ACT inhaler Inhale 2 puffs into the lungs 2 (two) times daily. 1 Inhaler 12  . buPROPion (WELLBUTRIN SR) 150 MG 12 hr tablet Take 1 tablet (150 mg total) by mouth every morning. 30 tablet 0  . esomeprazole (NEXIUM) 40 MG capsule Take 40 mg by mouth daily at 12 noon.    . fluticasone (FLONASE) 50 MCG/ACT nasal spray Place 2 sprays into both nostrils daily. 16 g 6  . metFORMIN (GLUCOPHAGE) 500 MG tablet Take 1 tablet (500 mg total) by mouth daily with breakfast. 30 tablet 0  . VENTOLIN HFA 108 (90 Base) MCG/ACT inhaler INHALE 1 TO 2 PUFFS BY MOUTH INTO THE LUNGS EVERY 6 HOURS AS NEEDED FOR WHEEZING OR SHORTNESS OF BREATH. 18 g 5  . escitalopram (LEXAPRO) 10 MG tablet Take 1 tablet (10 mg total) by mouth daily. 30 tablet 0  . venlafaxine XR (EFFEXOR XR) 37.5 MG 24 hr capsule Take 1 capsule (37.5 mg total) by mouth daily with breakfast. 30 capsule 1  . Vitamin D, Ergocalciferol, (DRISDOL) 50000 units CAPS capsule Take 1 capsule (50,000 Units total) by mouth every 7 (seven) days. 4 capsule 0   No facility-administered medications prior to visit.     ROS Review of Systems  Constitutional: Positive for unexpected weight change. Negative for activity change, appetite change, chills and fatigue.  HENT: Negative for congestion, mouth sores and sinus pressure.   Eyes: Negative for visual disturbance.  Respiratory: Negative for cough and chest tightness.     Cardiovascular: Positive for palpitations.  Gastrointestinal: Negative for abdominal pain and nausea.  Genitourinary: Negative for difficulty urinating, frequency and vaginal pain.  Musculoskeletal: Negative for back pain and gait problem.  Skin: Negative for pallor and rash.  Neurological: Negative for dizziness, tremors, weakness, numbness and headaches.  Psychiatric/Behavioral: Negative for confusion and sleep disturbance. The patient is nervous/anxious.     Objective:  BP 100/70 (BP Location: Left Arm, Patient Position: Sitting, Cuff Size: Large)   Pulse 89   Temp 98.2 F (36.8 C) (Oral)   Ht 5\' 3"  (1.6 m)   Wt 235 lb 4 oz (106.7 kg)   SpO2 99%   BMI 41.67 kg/m   BP Readings from Last 3 Encounters:  04/24/17 100/70  12/25/16 108/72  12/08/16 113/73    Wt Readings from Last 3 Encounters:  04/24/17 235 lb 4 oz (106.7 kg)  12/25/16 227 lb (103 kg)  12/08/16 226 lb (102.5 kg)    Physical Exam  Constitutional: She appears well-developed. No distress.  HENT:  Head: Normocephalic.  Right Ear: External ear normal.  Left Ear: External ear normal.  Nose: Nose normal.  Mouth/Throat: Oropharynx is clear and moist.  Eyes: Pupils are equal, round, and reactive to light. Conjunctivae are normal. Right eye exhibits no discharge. Left eye exhibits no discharge.  Neck: Normal range of motion. Neck supple. No JVD present. No tracheal  deviation present. No thyromegaly present.  Cardiovascular: Normal rate, regular rhythm and normal heart sounds.   Pulmonary/Chest: No stridor. No respiratory distress. She has no wheezes.  Abdominal: Soft. Bowel sounds are normal. She exhibits no distension and no mass. There is no tenderness. There is no rebound and no guarding.  Musculoskeletal: She exhibits no edema or tenderness.  Lymphadenopathy:    She has no cervical adenopathy.  Neurological: She displays normal reflexes. No cranial nerve deficit. She exhibits normal muscle tone. Coordination  normal.  Skin: No rash noted. No erythema.  Psychiatric: She has a normal mood and affect. Her behavior is normal. Judgment and thought content normal.  Obese  Lab Results  Component Value Date   WBC 7.2 03/24/2017   HGB 10.0 (L) 03/24/2017   HCT 31.5 (L) 03/24/2017   PLT 278.0 03/24/2017   GLUCOSE 90 12/16/2016   CHOL 175 12/16/2016   TRIG 245.0 (H) 12/16/2016   HDL 43.40 12/16/2016   LDLDIRECT 106.0 12/16/2016   LDLCALC 106 (H) 08/26/2016   ALT 15 12/16/2016   AST 17 12/16/2016   NA 137 12/16/2016   K 3.9 12/16/2016   CL 101 12/16/2016   CREATININE 0.85 12/16/2016   BUN 12 12/16/2016   CO2 29 12/16/2016   TSH 1.240 08/26/2016   HGBA1C 5.7 12/16/2016    US Abdomen Limited Ruq  Result Date: 03/17/2016 CLINICAL DATA:  Right upper quadrant abdominal pain for 2 weeks. EXAM: US ABDOMEN LIMITED - RIGHT UPPER QUADRANT COMPARISON:  Ultrasound of February 23, 2014. FINDINGS: Gallbladder: No gallstones or wall thickening visualized. No sonographic Murphy sign noted by sonographer. Common bile duct: Diameter: 3 mm which is within normal limits. Liver: No focal lesion identified. Within normal limits in parenchymal echogenicity. IMPRESSION: No abnormality seen in the right upper quadrant of the abdomen. Electronically Signed   By: Marijo Conception, M.D.   On: 03/17/2016 16:27    Assessment & Plan:   There are no diagnoses linked to this encounter. I have discontinued Ms. Signer's venlafaxine XR, escitalopram, and Vitamin D (Ergocalciferol). I am also having her maintain her esomeprazole, beclomethasone, fluticasone, Albuterol Sulfate, vitamin C, metFORMIN, buPROPion, and VENTOLIN HFA.  No orders of the defined types were placed in this encounter.    Follow-up: No Follow-up on file.  [name], MD

## 2017-04-24 NOTE — Assessment & Plan Note (Signed)
Xanax prn - rare  Potential benefits of a long term benzodiazepines  use as well as potential risks  and complications were explained to the patient and were aknowledged. 

## 2017-04-30 ENCOUNTER — Ambulatory Visit: Payer: Self-pay | Admitting: Psychology

## 2017-05-07 ENCOUNTER — Ambulatory Visit (INDEPENDENT_AMBULATORY_CARE_PROVIDER_SITE_OTHER): Payer: BLUE CROSS/BLUE SHIELD | Admitting: Psychology

## 2017-05-07 DIAGNOSIS — F410 Panic disorder [episodic paroxysmal anxiety] without agoraphobia: Secondary | ICD-10-CM | POA: Diagnosis not present

## 2017-05-14 ENCOUNTER — Other Ambulatory Visit (INDEPENDENT_AMBULATORY_CARE_PROVIDER_SITE_OTHER): Payer: Self-pay | Admitting: Family Medicine

## 2017-05-14 ENCOUNTER — Encounter: Payer: Self-pay | Admitting: Internal Medicine

## 2017-05-14 ENCOUNTER — Ambulatory Visit (INDEPENDENT_AMBULATORY_CARE_PROVIDER_SITE_OTHER): Payer: BLUE CROSS/BLUE SHIELD | Admitting: Psychology

## 2017-05-14 DIAGNOSIS — F411 Generalized anxiety disorder: Secondary | ICD-10-CM | POA: Diagnosis not present

## 2017-05-14 DIAGNOSIS — F3289 Other specified depressive episodes: Secondary | ICD-10-CM

## 2017-05-14 MED ORDER — BUPROPION HCL ER (SR) 150 MG PO TB12: 150.0000 mg | ORAL_TABLET | Freq: Every morning | ORAL | 1 refills | Status: DC

## 2017-05-18 LAB — HM PAP SMEAR

## 2017-05-19 ENCOUNTER — Ambulatory Visit: Payer: BLUE CROSS/BLUE SHIELD | Admitting: Psychology

## 2017-05-22 ENCOUNTER — Encounter: Payer: Self-pay | Admitting: Family Medicine

## 2017-05-22 ENCOUNTER — Ambulatory Visit (INDEPENDENT_AMBULATORY_CARE_PROVIDER_SITE_OTHER): Payer: BLUE CROSS/BLUE SHIELD | Admitting: Family Medicine

## 2017-05-22 VITALS — BP 110/74 | HR 81 | Temp 98.6°F | Ht 63.0 in | Wt 238.0 lb

## 2017-05-22 DIAGNOSIS — M62838 Other muscle spasm: Secondary | ICD-10-CM | POA: Insufficient documentation

## 2017-05-22 MED ORDER — CYCLOBENZAPRINE HCL 10 MG PO TABS: 10.0000 mg | ORAL_TABLET | Freq: Three times a day (TID) | ORAL | 1 refills | Status: DC | PRN

## 2017-05-22 MED ORDER — MELOXICAM 15 MG PO TABS: 15.0000 mg | ORAL_TABLET | Freq: Every day | ORAL | 0 refills | Status: DC | PRN

## 2017-05-22 MED FILL — MELOXICAM 15 MG TABLET: 15 | 30 days supply | Qty: 30 | Fill #0

## 2017-05-22 MED FILL — CYCLOBENZAPRINE 10 MG TABLE: 10 | 10 days supply | Qty: 30 | Fill #0

## 2017-05-22 NOTE — Patient Instructions (Signed)
Thank you for coming in,   Please take the mobic for 10 days straight and then as needed. The muscle relaxer can make you sleepy so be careful if you have to drive.   Please follow up with me if you don't have any improvement.    Please feel free to call with any questions or concerns at any time, at [phone]. --Dr. Raeford Razor

## 2017-05-22 NOTE — Assessment & Plan Note (Signed)
Her symptoms seem to be consistent with a muscle spasm. No signs of nerve compression. - Mobic and Flexeril provided - If no improvement can follow-up and further consideration of physical therapy versus trigger point injection. Also consider an x-ray of her neck.

## 2017-05-22 NOTE — Progress Notes (Signed)
Deanna Schwartz - 36 y.o. female MRN [id number]  Date of birth: 1981/02/11  SUBJECTIVE:  Including CC & ROS.  Chief Complaint  Patient presents with  . Neck Pain    left side neck pain down to shoulder--hard to turn-painful--happened this morning.    Ms Deanna Schwartz is a 36 year old female is presenting with a one-day history of left-sided neck and trapezius pain. She had this pain when she woke up this morning. She denies any injury or source of the pain. She denies any prior history of similar pain. Pain is worse when she moves her neck or sits on a soft cushion. The pain is sharp in nature. She has not taken anything for the pain. The pain is localized to her shoulder and neck region. There is no radicular symptoms. She denies new prior history of surgery to her neck or upper back. There is no numbness or tingling. She denies any fevers or chills.   Review of Systems  Constitutional: Negative for fever.  Musculoskeletal: Positive for neck pain.  Skin: Negative for color change.  Neurological: Negative for weakness and numbness.  Hematological: Negative for adenopathy.    HISTORY: Past Medical, Surgical, Social, and Family History Reviewed & Updated per EMR.   Pertinent Historical Findings include:  Past Medical History:  Diagnosis Date  . Allergic rhinitis   . Anemia   . Anxiety   . Asthma   . 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 Chronic constipation   . Constipation   . Depression   . GERD (gastroesophageal reflux disease)   . H. pylori infection 2008   Hx of tx + serology  . Iron deficiency anemia 08/06/2011   Hgb 10.2 and MCV 79 on health screen labs   . Irritable bowel syndrome   . Obesity   . Palpitations   . Panic attack   . Shortness of breath   . Ulcer     Past Surgical History:  Procedure Laterality Date  . FLEXIBLE SIGMOIDOSCOPY  04/14/2008   normal  . NASAL SINUS SURGERY    . UPPER GASTROINTESTINAL ENDOSCOPY       Allergies  Allergen Reactions  . Augmentin [Amoxicillin-Pot Clavulanate] Rash  . Cephalexin Rash  . Iodine Swelling and Rash    Throat swelling after eating shrimp    Family History  Problem Relation Age of Onset  . Diabetes Unknown        grandmother  . Hypertension Unknown   . Hypertension Mother   . Anxiety disorder Mother   . Obesity Mother   . Hypertension Father   . Diabetes Maternal Aunt   . Diabetes Maternal Aunt   . Diabetes Paternal Grandmother   . Colon polyps Paternal Uncle        x4  . Colon cancer Neg Hx      Social History   Social History  . Marital status: Married    Spouse name: N/A  . Number of children: 3  . Years of education: N/A   Occupational History  . [employer]   Social History Main Topics  . Smoking status: Never Smoker  . Smokeless tobacco: Never Used  . Alcohol use 0.0 oz/week     Comment: Social maybe twice amonth, beer  . Drug use: No  . Sexual activity: Yes    Birth control/ protection: None   Other Topics Concern  . Not on file   Social History Narrative  HS[employer], [employer] college in [place]. Occupation: [employer]. married  in Oct 2009. Son born in 2009, [place] dtrs - '07, '11. Marriage in good health.     PHYSICAL EXAM:  VS: BP 110/74   Pulse 81   Temp 98.6 F (37 C)   Ht 5\' 3"  (1.6 m)   Wt 238 lb (108 kg)   SpO2 99%   BMI 42.16 kg/m  Physical Exam Gen: NAD, alert, cooperative with exam, well-appearing ENT: normal lips, normal nasal mucosa,  Eye: normal EOM, normal conjunctiva and lids CV:  no edema, +2 pedal pulses   Resp: no accessory muscle use, non-labored,  GI: no masses or tenderness, no hernia  Skin: no rashes, no areas of induration  Neuro: normal tone, normal sensation to touch Psych:  normal insight, alert and oriented MSK:  Neck: Some tenderness to palpation over the cervical muscles. No tenderness to palpation over the cervical spine or thoracic spine. Normal  strength with shrug. Normal shoulder range of motion. Pain worse with extension of the neck.  pain worse with lateral bending to the left. Some limitations with lateral rotation. Neurovascularly intact    ASSESSMENT & PLAN:   Trapezius muscle spasm Her symptoms seem to be consistent with a muscle spasm. No signs of nerve compression. - Mobic and Flexeril provided - If no improvement can follow-up and further consideration of physical therapy versus trigger point injection. Also consider an x-ray of her neck.

## 2017-05-26 ENCOUNTER — Ambulatory Visit: Payer: Self-pay | Admitting: Psychology

## 2017-05-28 ENCOUNTER — Ambulatory Visit: Payer: Self-pay | Admitting: Psychology

## 2017-05-28 ENCOUNTER — Encounter: Payer: Self-pay | Admitting: Internal Medicine

## 2017-06-19 ENCOUNTER — Ambulatory Visit (INDEPENDENT_AMBULATORY_CARE_PROVIDER_SITE_OTHER): Payer: BLUE CROSS/BLUE SHIELD | Admitting: Psychology

## 2017-06-19 DIAGNOSIS — F4310 Post-traumatic stress disorder, unspecified: Secondary | ICD-10-CM

## 2017-06-23 ENCOUNTER — Ambulatory Visit: Payer: Self-pay | Admitting: Psychology

## 2017-07-03 ENCOUNTER — Ambulatory Visit (INDEPENDENT_AMBULATORY_CARE_PROVIDER_SITE_OTHER): Payer: BLUE CROSS/BLUE SHIELD | Admitting: Psychology

## 2017-07-03 DIAGNOSIS — F4310 Post-traumatic stress disorder, unspecified: Secondary | ICD-10-CM | POA: Diagnosis not present

## 2017-07-13 ENCOUNTER — Ambulatory Visit: Payer: BLUE CROSS/BLUE SHIELD | Admitting: Psychology

## 2017-07-27 MED FILL — CYANOCOBALAMIN 1,000 MCG/ML: 1000 | 84 days supply | Qty: 6 | Fill #1

## 2017-07-27 MED FILL — VENTOLIN HFA 90 MCG INHALER: 108 (90 BAS | 25 days supply | Qty: 18 | Fill #1

## 2017-07-28 ENCOUNTER — Other Ambulatory Visit (INDEPENDENT_AMBULATORY_CARE_PROVIDER_SITE_OTHER): Payer: BLUE CROSS/BLUE SHIELD

## 2017-07-28 DIAGNOSIS — E559 Vitamin D deficiency, unspecified: Secondary | ICD-10-CM | POA: Diagnosis not present

## 2017-07-28 DIAGNOSIS — E538 Deficiency of other specified B group vitamins: Secondary | ICD-10-CM

## 2017-07-28 DIAGNOSIS — F411 Generalized anxiety disorder: Secondary | ICD-10-CM

## 2017-07-28 LAB — BASIC METABOLIC PANEL
BUN: 10 mg/dL (ref 6–23)
CALCIUM: 9.3 mg/dL (ref 8.4–10.5)
CHLORIDE: 105 meq/L (ref 96–112)
CO2: 27 meq/L (ref 19–32)
CREATININE: 0.72 mg/dL (ref 0.40–1.20)
GFR: 118.24 mL/min (ref >60.00)
GLUCOSE: 102 mg/dL — AB (ref 70–99)
Potassium: 4 mEq/L (ref 3.5–5.1)
Sodium: 138 mEq/L (ref 135–145)

## 2017-07-28 LAB — VITAMIN B12: Vitamin B-12: 530 pg/mL (ref 211–911)

## 2017-07-28 LAB — VITAMIN D 25 HYDROXY (VIT D DEFICIENCY, FRACTURES): VITD: 22.81 ng/mL — ABNORMAL LOW (ref 30.00–100.00)

## 2017-08-07 ENCOUNTER — Ambulatory Visit: Payer: BLUE CROSS/BLUE SHIELD | Admitting: Psychology

## 2017-08-17 ENCOUNTER — Encounter: Payer: Self-pay | Admitting: Internal Medicine

## 2017-10-09 ENCOUNTER — Ambulatory Visit (INDEPENDENT_AMBULATORY_CARE_PROVIDER_SITE_OTHER): Payer: Self-pay | Admitting: Psychology

## 2017-10-09 DIAGNOSIS — F4310 Post-traumatic stress disorder, unspecified: Secondary | ICD-10-CM

## 2017-10-13 ENCOUNTER — Ambulatory Visit (INDEPENDENT_AMBULATORY_CARE_PROVIDER_SITE_OTHER): Payer: Self-pay | Admitting: Internal Medicine

## 2017-10-13 ENCOUNTER — Encounter: Payer: Self-pay | Admitting: Internal Medicine

## 2017-10-13 ENCOUNTER — Other Ambulatory Visit (INDEPENDENT_AMBULATORY_CARE_PROVIDER_SITE_OTHER): Payer: Self-pay

## 2017-10-13 DIAGNOSIS — F411 Generalized anxiety disorder: Secondary | ICD-10-CM

## 2017-10-13 DIAGNOSIS — E538 Deficiency of other specified B group vitamins: Secondary | ICD-10-CM

## 2017-10-13 DIAGNOSIS — Z6839 Body mass index (BMI) 39.0-39.9, adult: Secondary | ICD-10-CM

## 2017-10-13 DIAGNOSIS — R635 Abnormal weight gain: Secondary | ICD-10-CM

## 2017-10-13 DIAGNOSIS — J4520 Mild intermittent asthma, uncomplicated: Secondary | ICD-10-CM

## 2017-10-13 DIAGNOSIS — Z0000 Encounter for general adult medical examination without abnormal findings: Secondary | ICD-10-CM

## 2017-10-13 DIAGNOSIS — E669 Obesity, unspecified: Secondary | ICD-10-CM

## 2017-10-13 LAB — HEPATIC FUNCTION PANEL
ALT: 16 U/L (ref 0–35)
AST: 17 U/L (ref 0–37)
Albumin: 3.9 g/dL (ref 3.5–5.2)
Alkaline Phosphatase: 61 U/L (ref 39–117)
BILIRUBIN DIRECT: 0 mg/dL (ref 0.0–0.3)
TOTAL PROTEIN: 7.6 g/dL (ref 6.0–8.3)
Total Bilirubin: 0.3 mg/dL (ref 0.2–1.2)

## 2017-10-13 LAB — CBC WITH DIFFERENTIAL/PLATELET
BASOS ABS: 0.1 10*3/uL (ref 0.0–0.1)
Basophils Relative: 0.9 % (ref 0.0–3.0)
EOS ABS: 0.9 10*3/uL — AB (ref 0.0–0.7)
Eosinophils Relative: 12 % — ABNORMAL HIGH (ref 0.0–5.0)
HEMATOCRIT: 31.8 % — AB (ref 36.0–46.0)
HEMOGLOBIN: 9.9 g/dL — AB (ref 12.0–15.0)
LYMPHS PCT: 27.3 % (ref 12.0–46.0)
Lymphs Abs: 2.1 10*3/uL (ref 0.7–4.0)
MCHC: 31 g/dL (ref 30.0–36.0)
MCV: 70.2 fl — AB (ref 78.0–100.0)
MONOS PCT: 5.7 % (ref 3.0–12.0)
Monocytes Absolute: 0.4 10*3/uL (ref 0.1–1.0)
NEUTROS ABS: 4.3 10*3/uL (ref 1.4–7.7)
Neutrophils Relative %: 54.1 % (ref 43.0–77.0)
PLATELETS: 248 10*3/uL (ref 150.0–400.0)
RBC: 4.52 Mil/uL (ref 3.87–5.11)
RDW: 18.3 % — ABNORMAL HIGH (ref 11.5–15.5)
WBC: 7.9 10*3/uL (ref 4.0–10.5)

## 2017-10-13 LAB — URINALYSIS
Bilirubin Urine: NEGATIVE
HGB URINE DIPSTICK: NEGATIVE
Ketones, ur: NEGATIVE
LEUKOCYTES UA: NEGATIVE
Nitrite: NEGATIVE
SPECIFIC GRAVITY, URINE: 1.02 (ref 1.000–1.030)
Total Protein, Urine: NEGATIVE
URINE GLUCOSE: NEGATIVE
UROBILINOGEN UA: 0.2 (ref 0.0–1.0)
pH: 6 (ref 5.0–8.0)

## 2017-10-13 LAB — BASIC METABOLIC PANEL
BUN: 13 mg/dL (ref 6–23)
CHLORIDE: 104 meq/L (ref 96–112)
CO2: 28 mEq/L (ref 19–32)
Calcium: 8.9 mg/dL (ref 8.4–10.5)
Creatinine, Ser: 0.78 mg/dL (ref 0.40–1.20)
GFR: 107.68 mL/min (ref >60.00)
GLUCOSE: 93 mg/dL (ref 70–99)
POTASSIUM: 4 meq/L (ref 3.5–5.1)
Sodium: 138 mEq/L (ref 135–145)

## 2017-10-13 LAB — LIPID PANEL
CHOL/HDL RATIO: 3
CHOLESTEROL: 158 mg/dL (ref 0–200)
HDL: 49.7 mg/dL (ref >39.00)
NONHDL: 108.6
Triglycerides: 215 mg/dL — ABNORMAL HIGH (ref 0.0–149.0)
VLDL: 43 mg/dL — AB (ref 0.0–40.0)

## 2017-10-13 LAB — TSH: TSH: 2.42 u[IU]/mL (ref 0.35–4.50)

## 2017-10-13 LAB — LDL CHOLESTEROL, DIRECT: LDL DIRECT: 86 mg/dL

## 2017-10-13 MED ORDER — LORCASERIN HCL 10 MG PO TABS: 1.0000 | ORAL_TABLET | Freq: Two times a day (BID) | ORAL | 3 refills | Status: DC

## 2017-10-13 MED ORDER — BECLOMETHASONE DIPROPIONATE 40 MCG/ACT IN AERS: 2.0000 | INHALATION_SPRAY | Freq: Two times a day (BID) | RESPIRATORY_TRACT | 12 refills | Status: DC

## 2017-10-13 NOTE — Progress Notes (Signed)
Subjective:  Patient ID: Deanna Schwartz, female    DOB: May 12, 1981  Age: 37 y.o. MRN: [id number]  CC: No chief complaint on file.   HPI [facility] presents for a well exam  Outpatient Medications Prior to Visit  Medication Sig Dispense Refill  . Albuterol Sulfate (PROAIR RESPICLICK) 834 (90 Base) MCG/ACT AEPB Inhale 1-2 puffs into the lungs 4 (four) times daily as needed. 1 each 11  . Ascorbic Acid (VITAMIN C) 1000 MG tablet Take 1,000 mg by mouth daily.    [name] buPROPion (WELLBUTRIN SR) 150 MG 12 hr tablet Take 1 tablet (150 mg total) by mouth every morning. 90 tablet 1  . Cholecalciferol (VITAMIN D3) 2000 units capsule Take 1 capsule (2,000 Units total) by mouth daily. 100 capsule 3  . esomeprazole (NEXIUM) 40 MG capsule Take 40 mg by mouth daily at 12 noon.    . VENTOLIN HFA 108 (90 Base) MCG/ACT inhaler INHALE 1 TO 2 PUFFS BY MOUTH INTO THE LUNGS EVERY 6 HOURS AS NEEDED FOR WHEEZING OR SHORTNESS OF BREATH. 18 g 5  . beclomethasone (QVAR) 40 MCG/ACT inhaler Inhale 2 puffs into the lungs 2 (two) times daily. 1 Inhaler 12  . cyanocobalamin (,VITAMIN B-12,) 1000 MCG/ML injection Give 1000 mcg of Vitamin b12 sq daily x 1 week, then weekly for 1 month, then once every 2 weeks 10 mL 10  . cyclobenzaprine (FLEXERIL) 10 MG tablet Take 1 tablet (10 mg total) by mouth 3 (three) times daily as needed for muscle spasms. 30 tablet 1  . fluticasone (FLONASE) 50 MCG/ACT nasal spray Place 2 sprays into both nostrils daily. (Patient not taking: Reported on 05/22/2017) 16 g 6  . meloxicam (MOBIC) 15 MG tablet Take 1 tablet (15 mg total) by mouth daily as needed for pain. 30 tablet 0  . metFORMIN (GLUCOPHAGE) 500 MG tablet Take 1 tablet (500 mg total) by mouth daily with breakfast. (Patient not taking: Reported on 05/22/2017) 30 tablet 0  . Syringe/Needle, Disp, (B-D ECLIPSE SYRINGE) 30G X 1/2" 1 ML MISC 1 each by Does not apply route 1 day or 1 dose. 50 each 5   No facility-administered medications  prior to visit.     ROS Review of Systems  Constitutional: Negative for activity change, appetite change, chills, fatigue and unexpected weight change.  HENT: Negative for congestion, mouth sores and sinus pressure.   Eyes: Negative for visual disturbance.  Respiratory: Negative for cough and chest tightness.   Gastrointestinal: Negative for abdominal pain and nausea.  Genitourinary: Negative for difficulty urinating, frequency and vaginal pain.  Musculoskeletal: Negative for back pain and gait problem.  Skin: Negative for pallor and rash.  Neurological: Negative for dizziness, tremors, weakness, numbness and headaches.  Psychiatric/Behavioral: Negative for confusion and sleep disturbance.    Objective:  BP 112/72 (BP Location: Left Arm, Patient Position: Sitting, Cuff Size: Large)   Pulse 81   Temp 98.8 F (37.1 C) (Oral)   Ht 5\' 3"  (1.6 m)   Wt 241 lb (109.3 kg)   SpO2 98%   BMI 42.69 kg/m   BP Readings from Last 3 Encounters:  10/13/17 112/72  05/22/17 110/74  04/24/17 100/70    Wt Readings from Last 3 Encounters:  10/13/17 241 lb (109.3 kg)  05/22/17 238 lb (108 kg)  04/24/17 235 lb 4 oz (106.7 kg)    Physical Exam  Constitutional: She appears well-developed. No distress.  HENT:  Head: Normocephalic.  Right Ear: External ear normal.  Left Ear: External ear  normal.  Nose: Nose normal.  Mouth/Throat: Oropharynx is clear and moist.  Eyes: Conjunctivae are normal. Pupils are equal, round, and reactive to light. Right eye exhibits no discharge. Left eye exhibits no discharge.  Neck: Normal range of motion. Neck supple. No JVD present. No tracheal deviation present. No thyromegaly present.  Cardiovascular: Normal rate, regular rhythm and normal heart sounds.  Pulmonary/Chest: No stridor. No respiratory distress. She has no wheezes.  Abdominal: Soft. Bowel sounds are normal. She exhibits no distension and no mass. There is no tenderness. There is no rebound and no  guarding.  Musculoskeletal: She exhibits no edema or tenderness.  Lymphadenopathy:    She has no cervical adenopathy.  Neurological: She displays normal reflexes. No cranial nerve deficit. She exhibits normal muscle tone. Coordination normal.  Skin: No rash noted. No erythema.  Psychiatric: She has a normal mood and affect. Her behavior is normal. Judgment and thought content normal.  Obese  Lab Results  Component Value Date   WBC 7.2 03/24/2017   HGB 10.0 (L) 03/24/2017   HCT 31.5 (L) 03/24/2017   PLT 278.0 03/24/2017   GLUCOSE 102 (H) 07/28/2017   CHOL 175 12/16/2016   TRIG 245.0 (H) 12/16/2016   HDL 43.40 12/16/2016   LDLDIRECT 106.0 12/16/2016   LDLCALC 106 (H) 08/26/2016   ALT 15 12/16/2016   AST 17 12/16/2016   NA 138 07/28/2017   K 4.0 07/28/2017   CL 105 07/28/2017   CREATININE 0.72 07/28/2017   BUN 10 07/28/2017   CO2 27 07/28/2017   TSH 1.240 08/26/2016   HGBA1C 5.7 12/16/2016    US Abdomen Limited Ruq  Result Date: 03/17/2016 CLINICAL DATA:  Right upper quadrant abdominal pain for 2 weeks. EXAM: US ABDOMEN LIMITED - RIGHT UPPER QUADRANT COMPARISON:  Ultrasound of February 23, 2014. FINDINGS: Gallbladder: No gallstones or wall thickening visualized. No sonographic Murphy sign noted by sonographer. Common bile duct: Diameter: 3 mm which is within normal limits. Liver: No focal lesion identified. Within normal limits in parenchymal echogenicity. IMPRESSION: No abnormality seen in the right upper quadrant of the abdomen. Electronically Signed   By: Marijo Conception, M.D.   On: 03/17/2016 16:27    Assessment & Plan:   There are no diagnoses linked to this encounter. I have discontinued Deanna Schwartz's beclomethasone, fluticasone, metFORMIN, cyanocobalamin, Syringe/Needle (Disp), meloxicam, and cyclobenzaprine. I am also having her maintain her esomeprazole, Albuterol Sulfate, vitamin C, VENTOLIN HFA, Vitamin D3, and buPROPion.  No orders of the defined types were  placed in this encounter.    Follow-up: No Follow-up on file.  [name], MD

## 2017-10-13 NOTE — Assessment & Plan Note (Signed)
On B12 

## 2017-10-13 NOTE — Assessment & Plan Note (Signed)
Dr Marlowe Sax Not driving yet

## 2017-10-13 NOTE — Assessment & Plan Note (Signed)
We discussed age appropriate health related issues, including available/recomended screening tests and vaccinations. We discussed a need for adhering to healthy diet and exercise. Labs were ordered to be later reviewed . All questions were answered.   

## 2017-10-13 NOTE — Assessment & Plan Note (Addendum)
F/u w/Dr Leafy Deanna Schwartz The pt wants to try Belviq  Potential benefits of a long term Belviq use as well as potential risks (BPH, MI, OSA, cancer)  and complications were explained to the patient and were aknowledged.

## 2017-10-13 NOTE — Assessment & Plan Note (Signed)
Qvar and Proventil

## 2017-10-13 NOTE — Assessment & Plan Note (Signed)
Not seeing Dr Leafy Ro - appt in April pending

## 2017-10-13 NOTE — Assessment & Plan Note (Signed)
Wt Readings from Last 3 Encounters:  10/13/17 241 lb (109.3 kg)  05/22/17 238 lb (108 kg)  04/24/17 235 lb 4 oz (106.7 kg)

## 2017-10-22 ENCOUNTER — Other Ambulatory Visit: Payer: Self-pay | Admitting: Internal Medicine

## 2017-10-22 ENCOUNTER — Encounter: Payer: Self-pay | Admitting: Internal Medicine

## 2017-10-22 DIAGNOSIS — D508 Other iron deficiency anemias: Secondary | ICD-10-CM

## 2017-10-22 MED FILL — BUPROPION SR 150 MG TABLET: 150 | 90 days supply | Qty: 90 | Fill #0

## 2017-10-23 ENCOUNTER — Ambulatory Visit: Payer: Self-pay | Admitting: Psychology

## 2017-11-01 ENCOUNTER — Other Ambulatory Visit: Payer: Self-pay | Admitting: Internal Medicine

## 2017-11-01 MED ORDER — LORCASERIN HCL 10 MG PO TABS: 1.0000 | ORAL_TABLET | Freq: Two times a day (BID) | ORAL | 3 refills | Status: DC

## 2017-11-10 ENCOUNTER — Telehealth: Payer: Self-pay | Admitting: Hematology and Oncology

## 2017-11-10 ENCOUNTER — Encounter: Payer: Self-pay | Admitting: Hematology and Oncology

## 2017-11-10 NOTE — Telephone Encounter (Signed)
Pt has been scheduled for the pt to see Dr. Lebron Conners on 3/18 at 820am. Pt was originally scheduled to see Dr. Marin Olp on 3/6, but wanted the [facility]. She also needed an early morning appt. Letter mailed.

## 2017-11-12 ENCOUNTER — Ambulatory Visit (INDEPENDENT_AMBULATORY_CARE_PROVIDER_SITE_OTHER): Payer: Self-pay | Admitting: Psychology

## 2017-11-12 ENCOUNTER — Other Ambulatory Visit: Payer: Self-pay

## 2017-11-12 ENCOUNTER — Ambulatory Visit: Payer: Self-pay | Admitting: Family

## 2017-11-12 DIAGNOSIS — F4310 Post-traumatic stress disorder, unspecified: Secondary | ICD-10-CM

## 2017-11-16 ENCOUNTER — Encounter: Payer: Self-pay | Admitting: Internal Medicine

## 2017-11-17 ENCOUNTER — Telehealth: Payer: Self-pay

## 2017-11-17 NOTE — Telephone Encounter (Signed)
Key: [id number]

## 2017-11-19 NOTE — Telephone Encounter (Signed)
PA approved through 05/15/18

## 2017-11-23 ENCOUNTER — Encounter: Payer: Self-pay | Admitting: Hematology and Oncology

## 2017-11-23 ENCOUNTER — Other Ambulatory Visit: Payer: Self-pay

## 2017-11-23 ENCOUNTER — Inpatient Hospital Stay: Payer: BLUE CROSS/BLUE SHIELD | Attending: Hematology and Oncology | Admitting: Hematology and Oncology

## 2017-11-23 ENCOUNTER — Inpatient Hospital Stay: Payer: BLUE CROSS/BLUE SHIELD

## 2017-11-23 ENCOUNTER — Telehealth: Payer: Self-pay | Admitting: Internal Medicine

## 2017-11-23 ENCOUNTER — Telehealth: Payer: Self-pay

## 2017-11-23 VITALS — BP 119/72 | HR 89 | Temp 98.6°F | Resp 20 | Ht 63.0 in | Wt 239.6 lb

## 2017-11-23 DIAGNOSIS — E538 Deficiency of other specified B group vitamins: Secondary | ICD-10-CM | POA: Diagnosis not present

## 2017-11-23 DIAGNOSIS — K219 Gastro-esophageal reflux disease without esophagitis: Secondary | ICD-10-CM | POA: Insufficient documentation

## 2017-11-23 DIAGNOSIS — D500 Iron deficiency anemia secondary to blood loss (chronic): Secondary | ICD-10-CM

## 2017-11-23 DIAGNOSIS — Z79899 Other long term (current) drug therapy: Secondary | ICD-10-CM | POA: Diagnosis not present

## 2017-11-23 DIAGNOSIS — R5383 Other fatigue: Secondary | ICD-10-CM | POA: Insufficient documentation

## 2017-11-23 DIAGNOSIS — N920 Excessive and frequent menstruation with regular cycle: Secondary | ICD-10-CM | POA: Diagnosis not present

## 2017-11-23 LAB — CMP (CANCER CENTER ONLY)
ALT: 20 U/L (ref 0–55)
ANION GAP: 9 (ref 3–11)
AST: 24 U/L (ref 5–34)
Albumin: 3.8 g/dL (ref 3.5–5.0)
Alkaline Phosphatase: 80 U/L (ref 40–150)
BUN: 11 mg/dL (ref 7–26)
CHLORIDE: 103 mmol/L (ref 98–109)
CO2: 27 mmol/L (ref 22–29)
CREATININE: 0.82 mg/dL (ref 0.60–1.10)
Calcium: 9.9 mg/dL (ref 8.4–10.4)
Glucose, Bld: 89 mg/dL (ref 70–140)
POTASSIUM: 4.1 mmol/L (ref 3.5–5.1)
Sodium: 139 mmol/L (ref 136–145)
Total Bilirubin: <0.2 mg/dL — ABNORMAL LOW (ref 0.2–1.2)
Total Protein: 8.2 g/dL (ref 6.4–8.3)

## 2017-11-23 LAB — CBC WITH DIFFERENTIAL (CANCER CENTER ONLY)
BASOS ABS: 0.1 10*3/uL (ref 0.0–0.1)
Basophils Relative: 1 %
Eosinophils Absolute: 0.7 10*3/uL — ABNORMAL HIGH (ref 0.0–0.5)
Eosinophils Relative: 9 %
HEMATOCRIT: 33.6 % — AB (ref 34.8–46.6)
Hemoglobin: 10.6 g/dL — ABNORMAL LOW (ref 11.6–15.9)
LYMPHS ABS: 2.5 10*3/uL (ref 0.9–3.3)
LYMPHS PCT: 35 %
MCH: 23.2 pg — AB (ref 25.1–34.0)
MCHC: 31.6 g/dL (ref 31.5–36.0)
MCV: 73.2 fL — AB (ref 79.5–101.0)
Monocytes Absolute: 0.4 10*3/uL (ref 0.1–0.9)
Monocytes Relative: 6 %
NEUTROS ABS: 3.5 10*3/uL (ref 1.5–6.5)
Neutrophils Relative %: 49 %
Platelet Count: 302 10*3/uL (ref 145–400)
RBC: 4.59 MIL/uL (ref 3.70–5.45)
RDW: 18.7 % — ABNORMAL HIGH (ref 11.2–14.5)
WBC Count: 7.1 10*3/uL (ref 3.9–10.3)

## 2017-11-23 LAB — FOLATE: Folate: 19.8 ng/mL (ref >5.9)

## 2017-11-23 LAB — SEDIMENTATION RATE: SED RATE: 27 mm/h — AB (ref 0–22)

## 2017-11-23 LAB — IRON AND TIBC
Iron: 23 ug/dL — ABNORMAL LOW (ref 41–142)
SATURATION RATIOS: 5 % — AB (ref 21–57)
TIBC: 423 ug/dL (ref 236–444)
UIBC: 400 ug/dL

## 2017-11-23 LAB — LACTATE DEHYDROGENASE: LDH: 172 U/L (ref 125–245)

## 2017-11-23 LAB — FERRITIN: Ferritin: 8 ng/mL — ABNORMAL LOW (ref 9–269)

## 2017-11-23 LAB — VITAMIN B12: Vitamin B-12: 255 pg/mL (ref 180–914)

## 2017-11-23 LAB — C-REACTIVE PROTEIN: CRP: 1.1 mg/dL — AB (ref <1.0)

## 2017-11-23 NOTE — Telephone Encounter (Signed)
Patient requesting discount card for belviq.

## 2017-11-23 NOTE — Telephone Encounter (Signed)
Printed avs and calender of upcoming appointment. Per 3/18 los. Also patient has MyChart

## 2017-11-23 NOTE — Telephone Encounter (Signed)
Copied from [place] [phone]. Topic: General - Other >> Nov 23, 2017  3:33 PM Burnis Medin, NT wrote: Reason for CRM:  Patient called and wanted to see if the doctor had any discount cards and would like a call back.

## 2017-11-24 ENCOUNTER — Telehealth: Payer: Self-pay | Admitting: Hematology and Oncology

## 2017-11-24 LAB — HEMOGLOBINOPATHY EVALUATION
HGB A2 QUANT: 2.3 % (ref 1.8–3.2)
HGB A: 97.7 % (ref 96.4–98.8)
HGB C: 0 %
HGB S QUANTITAION: 0 %
Hgb F Quant: 0 % (ref 0.0–2.0)
Hgb Variant: 0 %

## 2017-11-24 LAB — HAPTOGLOBIN: HAPTOGLOBIN: 186 mg/dL (ref 34–200)

## 2017-11-24 NOTE — Telephone Encounter (Signed)
Tried to call regarding voicemail  °

## 2017-11-24 NOTE — Telephone Encounter (Signed)
Pt notified discount card put up front for her to pick up

## 2017-11-25 ENCOUNTER — Telehealth: Payer: Self-pay | Admitting: Hematology and Oncology

## 2017-11-25 ENCOUNTER — Ambulatory Visit (INDEPENDENT_AMBULATORY_CARE_PROVIDER_SITE_OTHER): Payer: Self-pay | Admitting: Psychology

## 2017-11-25 DIAGNOSIS — F4310 Post-traumatic stress disorder, unspecified: Secondary | ICD-10-CM

## 2017-11-25 LAB — COPPER, SERUM: COPPER: 164 ug/dL (ref 72–166)

## 2017-11-25 LAB — METHYLMALONIC ACID, SERUM: Methylmalonic Acid, Quantitative: 172 nmol/L (ref 0–378)

## 2017-11-25 LAB — ZINC: Zinc: 104 ug/dL (ref 56–134)

## 2017-11-25 NOTE — Telephone Encounter (Signed)
Patient called in to reschedule  °

## 2017-11-30 NOTE — Assessment & Plan Note (Signed)
37 y.o. female with mild to moderate anemia that was initially normocytic and normochromic and is becoming progressively microcytic and possibly hypochromic.  The 2 would identify deficiencies or vitamin B12 deficiency that appears to be adequately replaced at this time as well as iron deficiency initially diagnosed in 2017, and currently only treated with oral iron with 1 pill of ferrous sulfate per day.  Plan: -Repeat labs today as outlined below. -Increase ferrous sulfate to 3 times per day. - Return to clinic in 1 month with labs obtained 2-3 days prior to the clinic visit to assess iron replacement.  We will reserve possible iron infusion spot

## 2017-11-30 NOTE — Progress Notes (Signed)
[facility] New Visit:  Assessment: Iron deficiency anemia due to chronic blood loss - menses 37 y.o. female with mild to moderate anemia that was initially normocytic and normochromic and is becoming progressively microcytic and possibly hypochromic.  The 2 would identify deficiencies or vitamin B12 deficiency that appears to be adequately replaced at this time as well as iron deficiency initially diagnosed in 2017, and currently only treated with oral iron with 1 pill of ferrous sulfate per day.  Plan: -Repeat labs today as outlined below. -Increase ferrous sulfate to 3 times per day. - Return to clinic in 1 month with labs obtained 2-3 days prior to the clinic visit to assess iron replacement.  We will reserve possible iron infusion spot   Voice recognition software was used and creation of this note. Despite my best effort at editing the text, some misspelling/errors may have occurred. Orders Placed This Encounter  Procedures  . CBC with Differential (Cancer Center Only)    Standing Status:   Future    Standing Expiration Date:   11/24/2018  . CMP ([place] only)    Standing Status:   Future    Number of Occurrences:   1    Standing Expiration Date:   11/24/2018  . Lactate dehydrogenase (LDH)    Standing Status:   Future    Number of Occurrences:   1    Standing Expiration Date:   11/23/2018  . Iron and TIBC    Standing Status:   Future    Number of Occurrences:   1    Standing Expiration Date:   11/24/2018  . Ferritin    Standing Status:   Future    Number of Occurrences:   1    Standing Expiration Date:   11/24/2018  . Folate, Serum    Standing Status:   Future    Number of Occurrences:   1    Standing Expiration Date:   11/23/2018  . Vitamin B12    Standing Status:   Future    Number of Occurrences:   1    Standing Expiration Date:   11/23/2018  . Methylmalonic acid, serum    Standing Status:   Future    Number of Occurrences:   1    Standing  Expiration Date:   11/23/2018  . Copper, serum    Standing Status:   Future    Number of Occurrences:   1    Standing Expiration Date:   11/23/2018  . Zinc    Standing Status:   Future    Number of Occurrences:   1    Standing Expiration Date:   11/23/2018  . Haptoglobin    Standing Status:   Future    Number of Occurrences:   1    Standing Expiration Date:   11/23/2018  . Sedimentation rate    Standing Status:   Future    Number of Occurrences:   1    Standing Expiration Date:   11/23/2018  . C-reactive protein  . Hemoglobinopathy evaluation  . CBC with Differential (Cancer Center Only)    Standing Status:   Future    Number of Occurrences:   1    Standing Expiration Date:   11/24/2018  . CMP ([facility] only)    Standing Status:   Future    Standing Expiration Date:   11/24/2018  . Iron and TIBC    Standing Status:   Future    Standing  Expiration Date:   11/24/2018  . Ferritin    Standing Status:   Future    Standing Expiration Date:   11/24/2018    All questions were answered.  . The patient knows to call the clinic with any problems, questions or concerns.  This note was electronically signed.    History of Presenting Illness Deanna Schwartz 37 y.o. presenting to the [place] for complex anemia evaluation, referred by Dr Yevette Edwards Plotnikov.  Patient has a history of chronic mild to moderate anemia was diagnosed with iron deficiency in 2017 and B12 deficiency in 2012.  Has been receiving IM B12 supplementation since 2012.  Oral iron supplementation has been attempted, and patient has been taking 1 tablet/day complicated by constipation and poor adherence.  Patient denies any epistaxis, melena, hematochezia.  Menstrual periods are regular every 28 days, last menstrual period on 11/17/17.  Usually last about 6 days with hourly pad change for the first 2 days of the bleeding.  Patient denies fevers, chills, night sweats.  Does acknowledge fatigue, but is able to function  and work full-time.  Denies any changes in appetite.  Oncological/hematological History: --Labs, 08/26/16: Hgb 10.8, MCV 79.0, MCH 25.5, MCHC 32.4, RDW     ..., Plt    ...; Fe 61, FeSat 15%, TIBC 415, Ferritin 18, Folate 15.6, Vit B12 603 --Labs, 12/16/16: Hgb 10.6, MCV 72.4, MCH     ..., MCHC 31.5, RDW 16.2, Plt 304; --Labs, 03/24/17: Hgb 10.0, MCV 70.1, MCH     ..., MCHC 31.9, RDW 15.5, Plt 278;             Vit B12 140; --Labs, 07/28/17:                        Vit B12 530 --Labs, 10/13/17: Hgb   9.9, MCV 70.2, MCH     ..., MCHC 31.0, RDW 18.3, Plt 248; TSH 2.42   Medical History: Past Medical History:  Diagnosis Date  . Allergic rhinitis   . Anemia   . Anxiety   . Asthma   . 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 Chronic constipation   . Constipation   . Depression   . GERD (gastroesophageal reflux disease)   . H. pylori infection 2008   Hx of tx + serology  . Iron deficiency anemia 08/06/2011   Hgb 10.2 and MCV 79 on health screen labs   . Irritable bowel syndrome   . Obesity   . Palpitations   . Panic attack   . Shortness of breath   . Ulcer     Surgical History: Past Surgical History:  Procedure Laterality Date  . FLEXIBLE SIGMOIDOSCOPY  04/14/2008   normal  . NASAL SINUS SURGERY    . UPPER GASTROINTESTINAL ENDOSCOPY      Family History: Family History  Problem Relation Age of Onset  . Diabetes Unknown        grandmother  . Hypertension Unknown   . Hypertension Mother   . Anxiety disorder Mother   . Obesity Mother   . Hypertension Father   . Diabetes Maternal Aunt   . Diabetes Maternal Aunt   . Diabetes Paternal Grandmother   . Colon polyps Paternal Uncle        x4  . Colon cancer Neg Hx     Social History: Social History   Socioeconomic History  . Marital status: Married    Spouse name:  Not on file  . Number of children: 3  . Years of education: Not on file  . Highest education level: Not on file   Occupational History  . Occupation: Financial controller [employer]    Employer: [employer]  Social Needs  . Financial resource strain: Not on file  . Food insecurity:    Worry: Not on file    Inability: Not on file  . Transportation needs:    Medical: Not on file    Non-medical: Not on file  Tobacco Use  . Smoking status: Never Smoker  . Smokeless tobacco: Never Used  Substance and Sexual Activity  . Alcohol use: Yes    Alcohol/week: 0.0 oz    Comment: Social maybe twice amonth, beer  . Drug use: No  . Sexual activity: Yes    Birth control/protection: None  Lifestyle  . Physical activity:    Days per week: Not on file    Minutes per session: Not on file  . Stress: Not on file  Relationships  . Social connections:    Talks on phone: Not on file    Gets together: Not on file    Attends religious service: Not on file    Active member of club or organization: Not on file    Attends meetings of clubs or organizations: Not on file    Relationship status: Not on file  . Intimate partner violence:    Fear of current or ex partner: Not on file    Emotionally abused: Not on file    Physically abused: Not on file    Forced sexual activity: Not on file  Other Topics Concern  . Not on file  Social History Narrative   HSG, [employer] college in [place]. Occupation: [employer]. married  in Oct 2009. Son born in 2009, [place] dtrs - '07, '11. Marriage in good health.    Allergies: Allergies  Allergen Reactions  . Augmentin [Amoxicillin-Pot Clavulanate] Rash  . Cephalexin Rash  . Iodine Swelling and Rash    Throat swelling after eating shrimp    Medications:  Current Outpatient Medications  Medication Sig Dispense Refill  . Albuterol Sulfate (PROAIR RESPICLICK) 786 (90 Base) MCG/ACT AEPB Inhale 1-2 puffs into the lungs 4 (four) times daily as needed. 1 each 11  . Ascorbic Acid (VITAMIN C) 1000 MG tablet Take 1,000 mg by mouth daily.    . beclomethasone (QVAR) 40 MCG/ACT inhaler  Inhale 2 puffs into the lungs 2 (two) times daily. 1 Inhaler 12  . buPROPion (WELLBUTRIN SR) 150 MG 12 hr tablet Take 1 tablet (150 mg total) by mouth every morning. 90 tablet 1  . Cholecalciferol (VITAMIN D3) 2000 units capsule Take 1 capsule (2,000 Units total) by mouth daily. 100 capsule 3  . esomeprazole (NEXIUM) 40 MG capsule Take 40 mg by mouth daily at 12 noon.    . Lorcaserin HCl (BELVIQ) 10 MG TABS Take 1 tablet by mouth 2 (two) times daily. 60 tablet 3  . VENTOLIN HFA 108 (90 Base) MCG/ACT inhaler INHALE 1 TO 2 PUFFS BY MOUTH INTO THE LUNGS EVERY 6 HOURS AS NEEDED FOR WHEEZING OR SHORTNESS OF BREATH. 18 g 5   No current facility-administered medications for this visit.     Review of Systems: Review of Systems  Constitutional: Positive for fatigue.  All other systems reviewed and are negative.    PHYSICAL EXAMINATION Blood pressure 119/72, pulse 89, temperature 98.6 F (37 C), temperature source Oral, resp. rate  20, height '5\' 3"'$  (1.6 m), weight 239 lb 9.6 oz (108.7 kg), SpO2 100 %.  ECOG PERFORMANCE STATUS: 1 - Symptomatic but completely ambulatory  Physical Exam  Constitutional: She is oriented to person, place, and time and well-developed, well-nourished, and in no distress. No distress.  HENT:  Head: Normocephalic and atraumatic.  Mouth/Throat: Oropharynx is clear and moist. No oropharyngeal exudate.  Eyes: Pupils are equal, round, and reactive to light. Conjunctivae and EOM are normal. No scleral icterus.  Neck: No thyromegaly present.  Cardiovascular: Normal rate, regular rhythm, normal heart sounds and intact distal pulses.  No murmur heard. Pulmonary/Chest: Effort normal and breath sounds normal. No respiratory distress. She has no wheezes. She has no rales.  Abdominal: Soft. Bowel sounds are normal. She exhibits no distension and no mass. There is no tenderness. There is no guarding.  Musculoskeletal: She exhibits no edema.  Lymphadenopathy:    She has no cervical  adenopathy.  Neurological: She is alert and oriented to person, place, and time. She has normal reflexes. No cranial nerve deficit.  Skin: Skin is warm and dry. No rash noted. She is not diaphoretic. No erythema.     LABORATORY DATA: I have personally reviewed the data as listed: Appointment on 11/23/2017  Component Date Value Ref Range Status  . Sed Rate 11/23/2017 27* 0 - 22 mm/hr Final   Performed at [facility], [place] [address], [place], [place] [address]  . Haptoglobin 11/23/2017 186  34 - 200 mg/dL Final   Comment: (NOTE) Performed At: [facility] [place], [place] [id number] Rush Farmer MD [id number] Performed at [facility], [place] [address], [place], [place] [address]   . Zinc 11/23/2017 104  56 - 134 ug/dL Final   Comment: (NOTE) This test was developed and its performance characteristics determined by [facility]. It has not been cleared or approved by the Food and Drug Administration.                                Detection Limit = 5 Performed At: [facility] [place], [place] [id number] Rush Farmer MD [id number] Performed at [facility], [place] [address] [place], [place] [address]   . Copper 11/23/2017 164  72 - 166 ug/dL Final   Comment: (NOTE) This test was developed and its performance characteristics determined by [facility]. It has not been cleared or approved by the Food and Drug Administration.                                Detection Limit = 5 Performed At: [facility] [place], [place] [id number] Rush Farmer MD [id number] Performed at [facility], [place] [address] [place], [place] [address]   . Methylmalonic Acid, Quantitative 11/23/2017 172  0 - 378 nmol/L Final  . Disclaimer: 11/23/2017 Comment   Final   Comment: (NOTE) This test was developed and its performance  characteristics determined by [facility]. It has not been cleared or approved by the Food and Drug Administration. Performed At: [facility] [place], [place] [id number] Rush Farmer MD [id number] Performed at [facility], [place] [address] [place], [place] [address]   . Vitamin B-12 11/23/2017 255  180 - 914 pg/mL Final   Comment: (NOTE) This assay is  not validated for testing neonatal or myeloproliferative syndrome specimens for Vitamin B12 levels. Performed at [facility], [place] [address] [place], [place] [address]   . WBC Count 11/23/2017 7.1  3.9 - 10.3 K/uL Final  . RBC 11/23/2017 4.59  3.70 - 5.45 MIL/uL Final  . Hemoglobin 11/23/2017 10.6* 11.6 - 15.9 g/dL Final  . HCT 11/23/2017 33.6* 34.8 - 46.6 % Final  . MCV 11/23/2017 73.2* 79.5 - 101.0 fL Final  . MCH 11/23/2017 23.2* 25.1 - 34.0 pg Final  . MCHC 11/23/2017 31.6  31.5 - 36.0 g/dL Final  . RDW 11/23/2017 18.7* 11.2 - 14.5 % Final  . Platelet Count 11/23/2017 302  145 - 400 K/uL Final  . Neutrophils Relative % 11/23/2017 49  % Final  . Neutro Abs 11/23/2017 3.5  1.5 - 6.5 K/uL Final  . Lymphocytes Relative 11/23/2017 35  % Final  . Lymphs Abs 11/23/2017 2.5  0.9 - 3.3 K/uL Final  . Monocytes Relative 11/23/2017 6  % Final  . Monocytes Absolute 11/23/2017 0.4  0.1 - 0.9 K/uL Final  . Eosinophils Relative 11/23/2017 9  % Final  . Eosinophils Absolute 11/23/2017 0.7* 0.0 - 0.5 K/uL Final  . Basophils Relative 11/23/2017 1  % Final  . Basophils Absolute 11/23/2017 0.1  0.0 - 0.1 K/uL Final   Performed at [facility], [place] [address] [place], [place] [address]  . Folate 11/23/2017 19.8  >5.9 ng/mL Final   Performed at [facility], [place] [address], [place], [place] [address]  . Ferritin 11/23/2017 8* 9 - 269 ng/mL Final   Performed at [facility], [place] [address], [place], [place] [address]  . Iron 11/23/2017  23* 41 - 142 ug/dL Final  . TIBC 11/23/2017 423  236 - 444 ug/dL Final  . Saturation Ratios 11/23/2017 5* 21 - 57 % Final  . UIBC 11/23/2017 400  ug/dL Final   Performed at [facility], [place] [address], [place], [place] [address]  . LDH 11/23/2017 172  125 - 245 U/L Final   Performed at [facility], [place] [address] [place], [place] [address]  . Sodium 11/23/2017 139  136 - 145 mmol/L Final  . Potassium 11/23/2017 4.1  3.5 - 5.1 mmol/L Final  . Chloride 11/23/2017 103  98 - 109 mmol/L Final  . CO2 11/23/2017 27  22 - 29 mmol/L Final  . Glucose, Bld 11/23/2017 89  70 - 140 mg/dL Final  . BUN 11/23/2017 11  7 - 26 mg/dL Final  . Creatinine 11/23/2017 0.82  0.60 - 1.10 mg/dL Final  . Calcium 11/23/2017 9.9  8.4 - 10.4 mg/dL Final  . Total Protein 11/23/2017 8.2  6.4 - 8.3 g/dL Final  . Albumin 11/23/2017 3.8  3.5 - 5.0 g/dL Final  . AST 11/23/2017 24  5 - 34 U/L Final  . ALT 11/23/2017 20  0 - 55 U/L Final  . Alkaline Phosphatase 11/23/2017 80  40 - 150 U/L Final  . Total Bilirubin 11/23/2017 <0.2* 0.2 - 1.2 mg/dL Final  . GFR, Est Non Af Am 11/23/2017 >60  >60 mL/min Final  . GFR, Est AFR Am 11/23/2017 >60  >60 mL/min Final   Comment: (NOTE) The eGFR has been calculated using the CKD EPI equation. This calculation has not been validated in all clinical situations. eGFR's persistently <60 mL/min signify possible Chronic Kidney Disease.   . Anion gap 11/23/2017 9  3 - 11 Final   Performed at [facility], [place] [address], [place], [place] [address]  Office Visit on 11/23/2017  Component Date Value Ref Range Status  . CRP 11/23/2017 1.1* <1.0 mg/dL Final   Performed at [place] [address], [place], [place] [address]  . Hgb A2 Quant 11/23/2017 2.3  1.8 - 3.2 % Final  . Hgb F Quant 11/23/2017 0.0  0.0 - 2.0 % Final  . Hgb S Quant 11/23/2017 0.0  0.0 % Final  . Hgb C 11/23/2017 0.0  0.0 % Corrected   . Hgb A 11/23/2017 97.7  96.4 - 98.8 % Final  . Hgb Variant 11/23/2017 0.0  0.0 % Corrected  . Please Note: 11/23/2017 Comment   Corrected   Comment: (NOTE) Normal adult hemoglobin present. Performed At: [facility] [place], [place] [id number] Rush Farmer MD [id number] Performed at [facility], [place] [address] [place], [place] [address]          Ardath Sax, MD

## 2017-12-04 MED FILL — BELVIQ 10 MG TABLET: 10 | 30 days supply | Qty: 60 | Fill #0

## 2017-12-11 ENCOUNTER — Ambulatory Visit: Payer: BLUE CROSS/BLUE SHIELD | Admitting: Psychology

## 2017-12-17 ENCOUNTER — Encounter (INDEPENDENT_AMBULATORY_CARE_PROVIDER_SITE_OTHER): Payer: BLUE CROSS/BLUE SHIELD

## 2017-12-18 ENCOUNTER — Ambulatory Visit (INDEPENDENT_AMBULATORY_CARE_PROVIDER_SITE_OTHER): Payer: BLUE CROSS/BLUE SHIELD | Admitting: Psychology

## 2017-12-18 DIAGNOSIS — F4310 Post-traumatic stress disorder, unspecified: Secondary | ICD-10-CM

## 2017-12-21 ENCOUNTER — Ambulatory Visit (INDEPENDENT_AMBULATORY_CARE_PROVIDER_SITE_OTHER): Payer: Self-pay | Admitting: Family Medicine

## 2017-12-21 ENCOUNTER — Other Ambulatory Visit: Payer: Self-pay

## 2017-12-23 ENCOUNTER — Ambulatory Visit: Payer: Self-pay | Admitting: Hematology and Oncology

## 2017-12-23 ENCOUNTER — Ambulatory Visit: Payer: Self-pay

## 2017-12-28 ENCOUNTER — Inpatient Hospital Stay: Payer: BLUE CROSS/BLUE SHIELD | Attending: Hematology and Oncology

## 2017-12-28 DIAGNOSIS — D500 Iron deficiency anemia secondary to blood loss (chronic): Secondary | ICD-10-CM

## 2017-12-28 DIAGNOSIS — Z79899 Other long term (current) drug therapy: Secondary | ICD-10-CM | POA: Diagnosis not present

## 2017-12-28 DIAGNOSIS — I10 Essential (primary) hypertension: Secondary | ICD-10-CM | POA: Insufficient documentation

## 2017-12-28 DIAGNOSIS — E538 Deficiency of other specified B group vitamins: Secondary | ICD-10-CM

## 2017-12-28 DIAGNOSIS — K581 Irritable bowel syndrome with constipation: Secondary | ICD-10-CM | POA: Insufficient documentation

## 2017-12-28 DIAGNOSIS — D509 Iron deficiency anemia, unspecified: Secondary | ICD-10-CM | POA: Insufficient documentation

## 2017-12-28 DIAGNOSIS — K219 Gastro-esophageal reflux disease without esophagitis: Secondary | ICD-10-CM | POA: Diagnosis not present

## 2017-12-28 LAB — CBC WITH DIFFERENTIAL (CANCER CENTER ONLY)
BASOS PCT: 1 %
Basophils Absolute: 0.1 10*3/uL (ref 0.0–0.1)
EOS ABS: 0.5 10*3/uL (ref 0.0–0.5)
EOS PCT: 8 %
HCT: 33.3 % — ABNORMAL LOW (ref 34.8–46.6)
HEMOGLOBIN: 10.6 g/dL — AB (ref 11.6–15.9)
LYMPHS ABS: 1.9 10*3/uL (ref 0.9–3.3)
Lymphocytes Relative: 31 %
MCH: 24.1 pg — AB (ref 25.1–34.0)
MCHC: 31.9 g/dL (ref 31.5–36.0)
MCV: 75.4 fL — ABNORMAL LOW (ref 79.5–101.0)
MONOS PCT: 6 %
Monocytes Absolute: 0.4 10*3/uL (ref 0.1–0.9)
NEUTROS PCT: 54 %
Neutro Abs: 3.3 10*3/uL (ref 1.5–6.5)
PLATELETS: 270 10*3/uL (ref 145–400)
RBC: 4.42 MIL/uL (ref 3.70–5.45)
RDW: 17.1 % — ABNORMAL HIGH (ref 11.2–14.5)
WBC: 6.1 10*3/uL (ref 3.9–10.3)

## 2017-12-28 LAB — CMP (CANCER CENTER ONLY)
ALK PHOS: 77 U/L (ref 40–150)
ALT: 21 U/L (ref 0–55)
AST: 25 U/L (ref 5–34)
Albumin: 3.8 g/dL (ref 3.5–5.0)
Anion gap: 9 (ref 3–11)
BUN: 12 mg/dL (ref 7–26)
CALCIUM: 9.2 mg/dL (ref 8.4–10.4)
CHLORIDE: 106 mmol/L (ref 98–109)
CO2: 24 mmol/L (ref 22–29)
CREATININE: 0.81 mg/dL (ref 0.60–1.10)
Glucose, Bld: 94 mg/dL (ref 70–140)
Potassium: 4.5 mmol/L (ref 3.5–5.1)
Sodium: 139 mmol/L (ref 136–145)
Total Bilirubin: 0.3 mg/dL (ref 0.2–1.2)
Total Protein: 7.7 g/dL (ref 6.4–8.3)

## 2017-12-28 LAB — IRON AND TIBC
IRON: 24 ug/dL — AB (ref 41–142)
Saturation Ratios: 6 % — ABNORMAL LOW (ref 21–57)
TIBC: 413 ug/dL (ref 236–444)
UIBC: 389 ug/dL

## 2017-12-28 LAB — FERRITIN: FERRITIN: 7 ng/mL — AB (ref 9–269)

## 2017-12-30 ENCOUNTER — Ambulatory Visit: Payer: BLUE CROSS/BLUE SHIELD | Admitting: Psychology

## 2017-12-30 ENCOUNTER — Encounter: Payer: Self-pay | Admitting: Hematology and Oncology

## 2017-12-30 ENCOUNTER — Inpatient Hospital Stay: Payer: BLUE CROSS/BLUE SHIELD

## 2017-12-30 ENCOUNTER — Telehealth: Payer: Self-pay | Admitting: Hematology and Oncology

## 2017-12-30 ENCOUNTER — Inpatient Hospital Stay (HOSPITAL_BASED_OUTPATIENT_CLINIC_OR_DEPARTMENT_OTHER): Payer: BLUE CROSS/BLUE SHIELD | Admitting: Hematology and Oncology

## 2017-12-30 VITALS — BP 122/56 | HR 82 | Temp 98.2°F | Resp 14 | Ht 63.0 in | Wt 237.3 lb

## 2017-12-30 VITALS — BP 125/55 | HR 77 | Temp 97.9°F | Resp 16

## 2017-12-30 DIAGNOSIS — D500 Iron deficiency anemia secondary to blood loss (chronic): Secondary | ICD-10-CM

## 2017-12-30 DIAGNOSIS — E538 Deficiency of other specified B group vitamins: Secondary | ICD-10-CM

## 2017-12-30 DIAGNOSIS — K219 Gastro-esophageal reflux disease without esophagitis: Secondary | ICD-10-CM

## 2017-12-30 DIAGNOSIS — Z79899 Other long term (current) drug therapy: Secondary | ICD-10-CM

## 2017-12-30 DIAGNOSIS — D509 Iron deficiency anemia, unspecified: Secondary | ICD-10-CM

## 2017-12-30 DIAGNOSIS — K581 Irritable bowel syndrome with constipation: Secondary | ICD-10-CM | POA: Diagnosis not present

## 2017-12-30 DIAGNOSIS — I10 Essential (primary) hypertension: Secondary | ICD-10-CM | POA: Diagnosis not present

## 2017-12-30 DIAGNOSIS — D508 Other iron deficiency anemias: Secondary | ICD-10-CM

## 2017-12-30 MED ORDER — SODIUM CHLORIDE 0.9 % IV SOLN
Freq: Once | INTRAVENOUS | Status: AC
  Administered 2017-12-30: 10:00:00 via INTRAVENOUS

## 2017-12-30 MED ORDER — SODIUM CHLORIDE 0.9 % IV SOLN
510.0000 mg | Freq: Once | INTRAVENOUS | Status: AC
  Administered 2017-12-30: 510 mg via INTRAVENOUS
  Filled 2017-12-30: qty 17

## 2017-12-30 NOTE — Telephone Encounter (Signed)
Appointments scheduled AVS/Calendar printed per 4/24 los °

## 2017-12-30 NOTE — Patient Instructions (Signed)

## 2017-12-31 ENCOUNTER — Ambulatory Visit (INDEPENDENT_AMBULATORY_CARE_PROVIDER_SITE_OTHER): Payer: Self-pay | Admitting: Family Medicine

## 2017-12-31 ENCOUNTER — Encounter (INDEPENDENT_AMBULATORY_CARE_PROVIDER_SITE_OTHER): Payer: Self-pay

## 2018-01-06 ENCOUNTER — Ambulatory Visit (HOSPITAL_COMMUNITY)
Admission: RE | Admit: 2018-01-06 | Discharge: 2018-01-06 | Disposition: A | Discharge status: Home / Self Care | Payer: BLUE CROSS/BLUE SHIELD | Source: Ambulatory Visit | Attending: Hematology and Oncology | Admitting: Hematology and Oncology

## 2018-01-06 DIAGNOSIS — D500 Iron deficiency anemia secondary to blood loss (chronic): Secondary | ICD-10-CM | POA: Diagnosis present

## 2018-01-06 MED ORDER — FERUMOXYTOL INJECTION 510 MG/17 ML
510.0000 mg | Freq: Once | INTRAVENOUS | Status: AC
  Administered 2018-01-06: 510 mg via INTRAVENOUS
  Filled 2018-01-06: qty 17

## 2018-01-06 MED ORDER — SODIUM CHLORIDE 0.9 % IV SOLN
INTRAVENOUS | Status: DC
  Administered 2018-01-06: 10:00:00 via INTRAVENOUS

## 2018-01-06 NOTE — Discharge Instructions (Signed)

## 2018-01-06 NOTE — Progress Notes (Signed)
Pt received IV infusion of feraheme;  No complications noted; pt alert, oriented, and ambulatory upon discharge  Ordering Provder: Dr. Lebron Conners Associated Diagnosis: Iron deficiency anemia due to chronic blood loss - menses  D50.0

## 2018-01-13 ENCOUNTER — Ambulatory Visit: Payer: BLUE CROSS/BLUE SHIELD | Admitting: Psychology

## 2018-01-15 ENCOUNTER — Ambulatory Visit: Payer: BLUE CROSS/BLUE SHIELD | Admitting: Psychology

## 2018-01-18 NOTE — Assessment & Plan Note (Signed)
36 y.o. female with mild to moderate anemia that was initially normocytic and normochromic and is becoming progressively microcytic and possibly hypochromic.  Patient was identified to have a B12 deficiency as well as significant iron depletion.  B12 deficiency was adequately treated prior to her initial visit in our clinic, but her iron deficiency was only addressed with a single daily dose of ferrous sulfate which did not appear to have contributed significantly to improvement in hemoglobin.  Following our last clinic, dose of the oral iron was increased and patient was taking the pills twice daily now.  In the interim, MCV and MCH appeared to be improving although her hemoglobin remained stable.  Iron panel shows no significant improvement and iron storage consistent with ongoing utilization of the iron by the bone marrow as well as ongoing monthly loss with menstrual flow.  As the oral iron does not appear to be adequate to increase patient's iron storage, parenteral administration will be conducted.  Plan: -Proceed with Feraheme infusions 510 mg IV x2 -Continue ferrous sulfate 2 times per day. - Return to clinic in 3 month with labs obtained 2-3 days prior to the clinic visit to assess iron replacement.

## 2018-01-18 NOTE — Progress Notes (Signed)
[facility] Follow-up Visit:  Assessment: Anemia, iron deficiency 37 y.o. female with mild to moderate anemia that was initially normocytic and normochromic and is becoming progressively microcytic and possibly hypochromic.  Patient was identified to have a B12 deficiency as well as significant iron depletion.  B12 deficiency was adequately treated prior to her initial visit in our clinic, but her iron deficiency was only addressed with a single daily dose of ferrous sulfate which did not appear to have contributed significantly to improvement in hemoglobin.  Following our last clinic, dose of the oral iron was increased and patient was taking the pills twice daily now.  In the interim, MCV and MCH appeared to be improving although her hemoglobin remained stable.  Iron panel shows no significant improvement and iron storage consistent with ongoing utilization of the iron by the bone marrow as well as ongoing monthly loss with menstrual flow.  As the oral iron does not appear to be adequate to increase patient's iron storage, parenteral administration will be conducted.  Plan: -Proceed with Feraheme infusions 510 mg IV x2 -Continue ferrous sulfate 2 times per day. - Return to clinic in 3 month with labs obtained 2-3 days prior to the clinic visit to assess iron replacement.    Voice recognition software was used and creation of this note. Despite my best effort at editing the text, some misspelling/errors may have occurred.  Orders Placed This Encounter  Procedures  . CBC with Differential (Cancer Center Only)    Standing Status:   Future    Standing Expiration Date:   12/31/2018  . CMP ([facility] only)    Standing Status:   Future    Standing Expiration Date:   12/31/2018  . Iron and TIBC    Standing Status:   Future    Standing Expiration Date:   12/31/2018  . Ferritin    Standing Status:   Future    Standing Expiration Date:   12/31/2018  . Vitamin B12     Standing Status:   Future    Standing Expiration Date:   12/30/2018  . Methylmalonic acid, serum    Standing Status:   Future    Standing Expiration Date:   12/30/2018    Cancer Staging No matching staging information was found for the patient.  All questions were answered.  . The patient knows to call the clinic with any problems, questions or concerns.  This note was electronically signed.    History of Presenting Illness Deanna Schwartz is a 38 y.o. female followed in the [facility] for complex anemia evaluation, referred by Dr Yevette Edwards Plotnikov.  Patient has a history of chronic mild to moderate anemia was diagnosed with iron deficiency in 2017 and B12 deficiency in 2012.  Has been receiving IM B12 supplementation since 2012.  Oral iron supplementation has been attempted, and patient has been taking 1 tablet/day complicated by constipation and poor adherence.  Patient denies any epistaxis, melena, hematochezia.  Menstrual periods are regular every 28 days, last menstrual period on 11/17/17.  Usually last about 6 days with hourly pad change for the first 2 days of the bleeding.  At the last visit to the clinic, patient was started on oral iron supplementation with ferrous sulfate 325 mg twice daily to assess for possible efficiency of oral iron replacement.  Patient did not tolerate 3 times daily dosing due to constipation, but appears to have been able to comply with twice daily treatment.  Patient also reported  just recently completed her menstrual period.  Without additional complications patient denies fevers, chills, night sweats.  Does acknowledge fatigue, but is able to function and work full-time.  Denies any changes in appetite.  Oncological/hematological History: --Labs, 08/26/16: Hgb 10.8, MCV 79.0, MCH 25.5, MCHC 32.4, RDW     ..., Plt    ...; Fe 61, FeSat 15%, TIBC 415, Ferritin 18, Folate 15.6,  Vit B12 603 --Labs, 12/16/16: Hgb 10.6, MCV 72.4, MCH     ..., MCHC 31.5,  RDW 16.2, Plt 304; --Labs, 03/24/17: Hgb 10.0, MCV 70.1, MCH     ..., MCHC 31.9, RDW 15.5, Plt 278;                                                                                       Vit B12 140; --Labs, 07/28/17:                                                                                                                            Vit B12 530 --Labs, 10/13/17: Hgb   9.9, MCV 70.2, MCH     ..., MCHC 31.0, RDW 18.3, Plt 248; TSH 2.42 --Labs, 11/23/17: Hgb 10.6, MCV 73.2, MCH 23.2, MCHC 31.6, RDW 18.7, Plt 302; Fe 23, FeSat   5%, TIBC 423, Ferritin   8, Folate 19.8,  Vit B12 255, MMA 172, Cu 164, Zn 104 --Treatment:  --Ferrous sulfate 387m PO BID, 11/23/17- --Labs, 12/28/17: Hgb 10.6, MCV 75.4, MCH 24.1, MCHC 31.9, RDW 17.1, Plt 270; Fe 24, FeSat   6%, TIBC 413, Ferritin   7  --Ferahame 5164mIV x2    No history exists.    Medical History: Past Medical History:  Diagnosis Date  . Allergic rhinitis   . Anemia   . Anxiety   . Asthma   . 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 Chronic constipation   . Constipation   . Depression   . GERD (gastroesophageal reflux disease)   . H. pylori infection 2008   Hx of tx + serology  . Iron deficiency anemia 08/06/2011   Hgb 10.2 and MCV 79 on health screen labs   . Irritable bowel syndrome   . Obesity   . Palpitations   . Panic attack   . Shortness of breath   . Ulcer     Surgical History: Past Surgical History:  Procedure Laterality Date  . FLEXIBLE SIGMOIDOSCOPY  04/14/2008   normal  . NASAL SINUS SURGERY    . UPPER GASTROINTESTINAL ENDOSCOPY      Family History: Family History  Problem Relation Age  of Onset  . Diabetes Unknown        grandmother  . Hypertension Unknown   . Hypertension Mother   . Anxiety disorder Mother   . Obesity Mother   . Hypertension Father   . Diabetes Maternal Aunt   . Diabetes Maternal Aunt   . Diabetes Paternal Grandmother   . Colon polyps Paternal  Uncle        x4  . Colon cancer Neg Hx     Social History: Social History   Socioeconomic History  . Marital status: Married    Spouse name: Not on file  . Number of children: 3  . Years of education: Not on file  . Highest education level: Not on file  Occupational History  . Occupation: Financial controller [employer]    Employer: [employer]  Social Needs  . Financial resource strain: Not on file  . Food insecurity:    Worry: Not on file    Inability: Not on file  . Transportation needs:    Medical: Not on file    Non-medical: Not on file  Tobacco Use  . Smoking status: Never Smoker  . Smokeless tobacco: Never Used  Substance and Sexual Activity  . Alcohol use: Yes    Alcohol/week: 0.0 oz    Comment: Social maybe twice amonth, beer  . Drug use: No  . Sexual activity: Yes    Birth control/protection: None  Lifestyle  . Physical activity:    Days per week: Not on file    Minutes per session: Not on file  . Stress: Not on file  Relationships  . Social connections:    Talks on phone: Not on file    Gets together: Not on file    Attends religious service: Not on file    Active member of club or organization: Not on file    Attends meetings of clubs or organizations: Not on file    Relationship status: Not on file  . Intimate partner violence:    Fear of current or ex partner: Not on file    Emotionally abused: Not on file    Physically abused: Not on file    Forced sexual activity: Not on file  Other Topics Concern  . Not on file  Social History Narrative   HSG, [employer] college in [place]. Occupation: [employer]. married  in Oct 2009. Son born in 2009, [place] dtrs - '07, '11. Marriage in good health.    Allergies: Allergies  Allergen Reactions  . Augmentin [Amoxicillin-Pot Clavulanate] Rash  . Cephalexin Rash  . Iodine Swelling and Rash    Throat swelling after eating shrimp    Medications:  Current Outpatient Medications  Medication Sig Dispense Refill   . Albuterol Sulfate (PROAIR RESPICLICK) 580 (90 Base) MCG/ACT AEPB Inhale 1-2 puffs into the lungs 4 (four) times daily as needed. 1 each 11  . Ascorbic Acid (VITAMIN C) 1000 MG tablet Take 1,000 mg by mouth daily.    . beclomethasone (QVAR) 40 MCG/ACT inhaler Inhale 2 puffs into the lungs 2 (two) times daily. 1 Inhaler 12  . Cholecalciferol (VITAMIN D3) 2000 units capsule Take 1 capsule (2,000 Units total) by mouth daily. 100 capsule 3  . esomeprazole (NEXIUM) 40 MG capsule Take 40 mg by mouth daily at 12 noon.    . Lorcaserin HCl (BELVIQ) 10 MG TABS Take 1 tablet by mouth 2 (two) times daily. 60 tablet 3  . VENTOLIN HFA 108 (90 Base) MCG/ACT  inhaler INHALE 1 TO 2 PUFFS BY MOUTH INTO THE LUNGS EVERY 6 HOURS AS NEEDED FOR WHEEZING OR SHORTNESS OF BREATH. 18 g 5   No current facility-administered medications for this visit.     Review of Systems: Review of Systems  Constitutional: Positive for fatigue.  All other systems reviewed and are negative.    PHYSICAL EXAMINATION Blood pressure (!) 122/56, pulse 82, temperature 98.2 F (36.8 C), temperature source Oral, resp. rate 14, height _0  (1.6 m), weight 237 lb 4.8 oz (107.6 kg), SpO2 99 %.  ECOG PERFORMANCE STATUS: 1 - Symptomatic but completely ambulatory  Physical Exam  Constitutional: She is oriented to person, place, and time. She appears well-developed and well-nourished. No distress.  HENT:  Head: Normocephalic and atraumatic.  Mouth/Throat: Oropharynx is clear and moist. No oropharyngeal exudate.  Eyes: Pupils are equal, round, and reactive to light. Conjunctivae and EOM are normal. No scleral icterus.  Neck: No thyromegaly present.  Cardiovascular: Normal rate, regular rhythm, normal heart sounds and intact distal pulses. Exam reveals no gallop and no friction rub.  No murmur heard. Pulmonary/Chest: Effort normal and breath sounds normal. No stridor. No respiratory distress. She has no wheezes. She has no rales.   Abdominal: Soft. Bowel sounds are normal. She exhibits no distension and no mass. There is no tenderness. There is no guarding.  Musculoskeletal: She exhibits no edema.  Lymphadenopathy:    She has no cervical adenopathy.  Neurological: She is alert and oriented to person, place, and time. She displays normal reflexes. No cranial nerve deficit or sensory deficit.  Skin: Skin is warm and dry. No rash noted. She is not diaphoretic. No erythema. No pallor.     LABORATORY DATA: I have personally reviewed the data as listed: Appointment on 12/28/2017  Component Date Value Ref Range Status  . Ferritin 12/28/2017 7* 9 - 269 ng/mL Final   Performed at [facility], [place] [address], [place], [place] [address]  . Iron 12/28/2017 24* 41 - 142 ug/dL Final  . TIBC 12/28/2017 413  236 - 444 ug/dL Final  . Saturation Ratios 12/28/2017 6* 21 - 57 % Final  . UIBC 12/28/2017 389  ug/dL Final   Performed at [facility], [place] [address], [place], [place] [address]  . Sodium 12/28/2017 139  136 - 145 mmol/L Final  . Potassium 12/28/2017 4.5  3.5 - 5.1 mmol/L Final  . Chloride 12/28/2017 106  98 - 109 mmol/L Final  . CO2 12/28/2017 24  22 - 29 mmol/L Final  . Glucose, Bld 12/28/2017 94  70 - 140 mg/dL Final  . BUN 12/28/2017 12  7 - 26 mg/dL Final  . Creatinine 12/28/2017 0.81  0.60 - 1.10 mg/dL Final  . Calcium 12/28/2017 9.2  8.4 - 10.4 mg/dL Final  . Total Protein 12/28/2017 7.7  6.4 - 8.3 g/dL Final  . Albumin 12/28/2017 3.8  3.5 - 5.0 g/dL Final  . AST 12/28/2017 25  5 - 34 U/L Final  . ALT 12/28/2017 21  0 - 55 U/L Final  . Alkaline Phosphatase 12/28/2017 77  40 - 150 U/L Final  . Total Bilirubin 12/28/2017 0.3  0.2 - 1.2 mg/dL Final  . GFR, Est Non Af Am 12/28/2017 >60  >60 mL/min Final  . GFR, Est AFR Am 12/28/2017 >60  >60 mL/min Final   Comment: (NOTE) The eGFR has been calculated using the CKD EPI equation. This calculation has not been  validated in  all clinical situations. eGFR's persistently <60 mL/min signify possible Chronic Kidney Disease.   Georgiann Hahn gap 12/28/2017 9  3 - 11 Final   Performed at [facility], [place] [address], [place], [place] [address]  . WBC Count 12/28/2017 6.1  3.9 - 10.3 K/uL Final  . RBC 12/28/2017 4.42  3.70 - 5.45 MIL/uL Final  . Hemoglobin 12/28/2017 10.6* 11.6 - 15.9 g/dL Final  . HCT 12/28/2017 33.3* 34.8 - 46.6 % Final  . MCV 12/28/2017 75.4* 79.5 - 101.0 fL Final  . MCH 12/28/2017 24.1* 25.1 - 34.0 pg Final  . MCHC 12/28/2017 31.9  31.5 - 36.0 g/dL Final  . RDW 12/28/2017 17.1* 11.2 - 14.5 % Final  . Platelet Count 12/28/2017 270  145 - 400 K/uL Final  . Neutrophils Relative % 12/28/2017 54  % Final  . Neutro Abs 12/28/2017 3.3  1.5 - 6.5 K/uL Final  . Lymphocytes Relative 12/28/2017 31  % Final  . Lymphs Abs 12/28/2017 1.9  0.9 - 3.3 K/uL Final  . Monocytes Relative 12/28/2017 6  % Final  . Monocytes Absolute 12/28/2017 0.4  0.1 - 0.9 K/uL Final  . Eosinophils Relative 12/28/2017 8  % Final  . Eosinophils Absolute 12/28/2017 0.5  0.0 - 0.5 K/uL Final  . Basophils Relative 12/28/2017 1  % Final  . Basophils Absolute 12/28/2017 0.1  0.0 - 0.1 K/uL Final   Performed at [facility], [place] [address], [place], [place] [address]       Ardath Sax, MD

## 2018-02-03 ENCOUNTER — Ambulatory Visit (INDEPENDENT_AMBULATORY_CARE_PROVIDER_SITE_OTHER): Payer: BLUE CROSS/BLUE SHIELD | Admitting: Psychology

## 2018-02-03 DIAGNOSIS — F4310 Post-traumatic stress disorder, unspecified: Secondary | ICD-10-CM | POA: Diagnosis not present

## 2018-02-08 ENCOUNTER — Ambulatory Visit (INDEPENDENT_AMBULATORY_CARE_PROVIDER_SITE_OTHER): Payer: BLUE CROSS/BLUE SHIELD | Admitting: Psychology

## 2018-02-08 DIAGNOSIS — F4310 Post-traumatic stress disorder, unspecified: Secondary | ICD-10-CM

## 2018-02-09 MED FILL — VENTOLIN HFA 90 MCG INHALER: 108 (90 BAS | 25 days supply | Qty: 18 | Fill #2

## 2018-02-12 ENCOUNTER — Telehealth: Payer: Self-pay | Admitting: Hematology and Oncology

## 2018-02-12 NOTE — Telephone Encounter (Signed)
Called pt re appts being moved to [facility] - spoke w/ pt re appts

## 2018-02-18 ENCOUNTER — Ambulatory Visit: Payer: Self-pay | Admitting: Psychology

## 2018-03-23 ENCOUNTER — Encounter: Payer: Self-pay | Admitting: Internal Medicine

## 2018-03-23 DIAGNOSIS — D508 Other iron deficiency anemias: Secondary | ICD-10-CM

## 2018-03-23 DIAGNOSIS — E538 Deficiency of other specified B group vitamins: Secondary | ICD-10-CM

## 2018-03-25 ENCOUNTER — Ambulatory Visit: Payer: BLUE CROSS/BLUE SHIELD | Admitting: Internal Medicine

## 2018-03-26 NOTE — Addendum Note (Signed)
Addended by: Earnstine Regal on: 03/26/2018 08:56 AM   Modules accepted: Orders

## 2018-03-28 ENCOUNTER — Other Ambulatory Visit: Payer: Self-pay | Admitting: Internal Medicine

## 2018-03-28 DIAGNOSIS — D508 Other iron deficiency anemias: Secondary | ICD-10-CM

## 2018-03-28 DIAGNOSIS — E559 Vitamin D deficiency, unspecified: Secondary | ICD-10-CM

## 2018-03-28 DIAGNOSIS — E538 Deficiency of other specified B group vitamins: Secondary | ICD-10-CM

## 2018-04-01 ENCOUNTER — Other Ambulatory Visit (INDEPENDENT_AMBULATORY_CARE_PROVIDER_SITE_OTHER): Payer: BLUE CROSS/BLUE SHIELD

## 2018-04-01 DIAGNOSIS — E559 Vitamin D deficiency, unspecified: Secondary | ICD-10-CM

## 2018-04-01 DIAGNOSIS — E538 Deficiency of other specified B group vitamins: Secondary | ICD-10-CM

## 2018-04-01 DIAGNOSIS — D508 Other iron deficiency anemias: Secondary | ICD-10-CM | POA: Diagnosis not present

## 2018-04-01 LAB — CBC WITH DIFFERENTIAL/PLATELET
BASOS PCT: 0.6 % (ref 0.0–3.0)
Basophils Absolute: 0 10*3/uL (ref 0.0–0.1)
Eosinophils Absolute: 0.5 10*3/uL (ref 0.0–0.7)
Eosinophils Relative: 7 % — ABNORMAL HIGH (ref 0.0–5.0)
HEMATOCRIT: 37.5 % (ref 36.0–46.0)
Hemoglobin: 12.8 g/dL (ref 12.0–15.0)
LYMPHS ABS: 1.9 10*3/uL (ref 0.7–4.0)
LYMPHS PCT: 25.5 % (ref 12.0–46.0)
MCHC: 34.2 g/dL (ref 30.0–36.0)
MCV: 87.5 fl (ref 78.0–100.0)
MONOS PCT: 4.6 % (ref 3.0–12.0)
Monocytes Absolute: 0.3 10*3/uL (ref 0.1–1.0)
NEUTROS ABS: 4.7 10*3/uL (ref 1.4–7.7)
Neutrophils Relative %: 62.3 % (ref 43.0–77.0)
PLATELETS: 252 10*3/uL (ref 150.0–400.0)
RBC: 4.29 Mil/uL (ref 3.87–5.11)
RDW: 16.2 % — AB (ref 11.5–15.5)
WBC: 7.5 10*3/uL (ref 4.0–10.5)

## 2018-04-01 LAB — VITAMIN D 25 HYDROXY (VIT D DEFICIENCY, FRACTURES): VITD: 19.16 ng/mL — ABNORMAL LOW (ref 30.00–100.00)

## 2018-04-01 LAB — IRON,TIBC AND FERRITIN PANEL
%SAT: 19 % (calc) (ref 16–45)
Ferritin: 116 ng/mL (ref 16–154)
Iron: 58 ug/dL (ref 40–190)
TIBC: 312 mcg/dL (calc) (ref 250–450)

## 2018-04-01 LAB — VITAMIN B12: VITAMIN B 12: 240 pg/mL (ref 211–911)

## 2018-04-12 ENCOUNTER — Inpatient Hospital Stay: Payer: BLUE CROSS/BLUE SHIELD

## 2018-04-12 ENCOUNTER — Telehealth: Payer: Self-pay | Admitting: *Deleted

## 2018-04-12 NOTE — Telephone Encounter (Signed)
Patient called to reschedule lab/md/infusion appt from 08/05 and 08/07/ to another day.  Sent to scheduling to reschedule

## 2018-04-13 ENCOUNTER — Telehealth: Payer: Self-pay | Admitting: Oncology

## 2018-04-13 NOTE — Telephone Encounter (Signed)
Called and rescheduled appts per sch msg - pt wanted to push it out two weeks.

## 2018-04-14 ENCOUNTER — Inpatient Hospital Stay: Payer: BLUE CROSS/BLUE SHIELD

## 2018-04-14 ENCOUNTER — Inpatient Hospital Stay: Payer: BLUE CROSS/BLUE SHIELD | Admitting: Oncology

## 2018-04-23 ENCOUNTER — Ambulatory Visit: Payer: BLUE CROSS/BLUE SHIELD | Admitting: Internal Medicine

## 2018-04-23 ENCOUNTER — Encounter: Payer: Self-pay | Admitting: Internal Medicine

## 2018-04-23 VITALS — BP 124/78 | HR 70 | Temp 98.2°F | Ht 63.0 in | Wt 232.0 lb

## 2018-04-23 DIAGNOSIS — E669 Obesity, unspecified: Secondary | ICD-10-CM

## 2018-04-23 DIAGNOSIS — D508 Other iron deficiency anemias: Secondary | ICD-10-CM

## 2018-04-23 DIAGNOSIS — E559 Vitamin D deficiency, unspecified: Secondary | ICD-10-CM | POA: Diagnosis not present

## 2018-04-23 DIAGNOSIS — E538 Deficiency of other specified B group vitamins: Secondary | ICD-10-CM | POA: Diagnosis not present

## 2018-04-23 DIAGNOSIS — J4520 Mild intermittent asthma, uncomplicated: Secondary | ICD-10-CM | POA: Diagnosis not present

## 2018-04-23 DIAGNOSIS — D500 Iron deficiency anemia secondary to blood loss (chronic): Secondary | ICD-10-CM

## 2018-04-23 MED ORDER — VITAMIN B-12 1000 MCG SL SUBL: 1.0000 | SUBLINGUAL_TABLET | Freq: Every day | SUBLINGUAL | 3 refills | Status: DC

## 2018-04-23 MED ORDER — ALBUTEROL SULFATE 108 (90 BASE) MCG/ACT IN AEPB: 1.0000 | INHALATION_SPRAY | Freq: Four times a day (QID) | RESPIRATORY_TRACT | 11 refills | Status: DC | PRN

## 2018-04-23 MED ORDER — CYANOCOBALAMIN 1000 MCG/ML IJ SOLN
1000.0000 ug | Freq: Once | INTRAMUSCULAR | Status: AC
  Administered 2018-04-23: 1000 ug via INTRAMUSCULAR

## 2018-04-23 MED ORDER — VITAMIN D3 1.25 MG (50000 UT) PO CAPS: 1.0000 | ORAL_CAPSULE | ORAL | 0 refills | Status: DC

## 2018-04-23 MED FILL — PROAIR RESPICLICK INHAL PWD: 108 (90 BAS | 25 days supply | Qty: 1 | Fill #0

## 2018-04-23 MED FILL — VITAMIN D3 50000 UNIT CAPS: 1.25 MG | 56 days supply | Qty: 8 | Fill #0

## 2018-04-23 NOTE — Assessment & Plan Note (Signed)
Proventil prn 

## 2018-04-23 NOTE — Assessment & Plan Note (Signed)
B12 inj today Take PO daily B12

## 2018-04-23 NOTE — Progress Notes (Signed)
Subjective:  Patient ID: Deanna Schwartz, female    DOB: [birth year]  Age: 37 y.o. MRN: [id number]  CC: No chief complaint on file.   HPI [facility] presents for anemia, Vit D and Vit B12 def  Outpatient Medications Prior to Visit  Medication Sig Dispense Refill  . Albuterol Sulfate (PROAIR RESPICLICK) 785 (90 Base) MCG/ACT AEPB Inhale 1-2 puffs into the lungs 4 (four) times daily as needed. 1 each 11  . Ascorbic Acid (VITAMIN C) 1000 MG tablet Take 1,000 mg by mouth daily.    . beclomethasone (QVAR) 40 MCG/ACT inhaler Inhale 2 puffs into the lungs 2 (two) times daily. 1 Inhaler 12  . Cholecalciferol (VITAMIN D3) 2000 units capsule Take 1 capsule (2,000 Units total) by mouth daily. 100 capsule 3  . esomeprazole (NEXIUM) 40 MG capsule Take 40 mg by mouth daily at 12 noon.    . Lorcaserin HCl (BELVIQ) 10 MG TABS Take 1 tablet by mouth 2 (two) times daily. 60 tablet 3  . VENTOLIN HFA 108 (90 Base) MCG/ACT inhaler INHALE 1 TO 2 PUFFS BY MOUTH INTO THE LUNGS EVERY 6 HOURS AS NEEDED FOR WHEEZING OR SHORTNESS OF BREATH. 18 g 5   No facility-administered medications prior to visit.     ROS: Review of Systems  Constitutional: Positive for fatigue. Negative for activity change, appetite change, chills and unexpected weight change.  HENT: Negative for congestion, mouth sores and sinus pressure.   Eyes: Negative for visual disturbance.  Respiratory: Negative for cough and chest tightness.   Gastrointestinal: Negative for abdominal pain and nausea.  Genitourinary: Negative for difficulty urinating, frequency and vaginal pain.  Musculoskeletal: Negative for back pain and gait problem.  Skin: Negative for pallor and rash.  Neurological: Negative for dizziness, tremors, weakness, numbness and headaches.  Psychiatric/Behavioral: Negative for confusion, sleep disturbance and suicidal ideas.  obese  Objective:  BP 124/78 (BP Location: Left Arm, Patient Position: Sitting, Cuff Size: Large)    Pulse 70   Temp 98.2 F (36.8 C) (Oral)   Ht 5\' 3"  (1.6 m)   Wt 232 lb (105.2 kg)   SpO2 99%   BMI 41.10 kg/m   BP Readings from Last 3 Encounters:  04/23/18 124/78  01/06/18 (!) 108/59  12/30/17 (!) 125/55    Wt Readings from Last 3 Encounters:  04/23/18 232 lb (105.2 kg)  12/30/17 237 lb 4.8 oz (107.6 kg)  11/23/17 239 lb 9.6 oz (108.7 kg)    Physical Exam  Constitutional: She appears well-developed. No distress.  HENT:  Head: Normocephalic.  Right Ear: External ear normal.  Left Ear: External ear normal.  Nose: Nose normal.  Mouth/Throat: Oropharynx is clear and moist.  Eyes: Pupils are equal, round, and reactive to light. Conjunctivae are normal. Right eye exhibits no discharge. Left eye exhibits no discharge.  Neck: Normal range of motion. Neck supple. No JVD present. No tracheal deviation present. No thyromegaly present.  Cardiovascular: Normal rate, regular rhythm and normal heart sounds.  Pulmonary/Chest: No stridor. No respiratory distress. She has no wheezes.  Abdominal: Soft. Bowel sounds are normal. She exhibits no distension and no mass. There is no tenderness. There is no rebound and no guarding.  Musculoskeletal: She exhibits no edema or tenderness.  Lymphadenopathy:    She has no cervical adenopathy.  Neurological: She displays normal reflexes. No cranial nerve deficit. She exhibits normal muscle tone. Coordination normal.  Skin: No rash noted. No erythema.  Psychiatric: She has a normal mood and affect. Her behavior  is normal. Judgment and thought content normal.    Lab Results  Component Value Date   WBC 7.5 04/01/2018   HGB 12.8 04/01/2018   HCT 37.5 04/01/2018   PLT 252.0 04/01/2018   GLUCOSE 94 12/28/2017   CHOL 158 10/13/2017   TRIG 215.0 (H) 10/13/2017   HDL 49.70 10/13/2017   LDLDIRECT 86.0 10/13/2017   LDLCALC 106 (H) 08/26/2016   ALT 21 12/28/2017   AST 25 12/28/2017   NA 139 12/28/2017   K 4.5 12/28/2017   CL 106 12/28/2017    CREATININE 0.81 12/28/2017   BUN 12 12/28/2017   CO2 24 12/28/2017   TSH 2.42 10/13/2017   HGBA1C 5.7 12/16/2016    No results found.  Assessment & Plan:   There are no diagnoses linked to this encounter.   No orders of the defined types were placed in this encounter.    Follow-up: No follow-ups on file.  [name], MD

## 2018-04-23 NOTE — Assessment & Plan Note (Signed)
CBC, iron in 3 mo

## 2018-04-23 NOTE — Assessment & Plan Note (Signed)
On low carb diet, Dr Wendy Poet Readings from Last 3 Encounters:  04/23/18 232 lb (105.2 kg)  12/30/17 237 lb 4.8 oz (107.6 kg)  11/23/17 239 lb 9.6 oz (108.7 kg)

## 2018-04-28 ENCOUNTER — Inpatient Hospital Stay: Payer: BLUE CROSS/BLUE SHIELD | Attending: Hematology and Oncology

## 2018-04-28 ENCOUNTER — Other Ambulatory Visit: Payer: Self-pay | Admitting: Internal Medicine

## 2018-04-29 ENCOUNTER — Telehealth: Payer: Self-pay | Admitting: *Deleted

## 2018-04-29 NOTE — Telephone Encounter (Signed)
Received voice mail message from patient stating,"I need to cancel my appointments for tomorrow. I need to reschedule them to November per my PCP."

## 2018-04-30 ENCOUNTER — Ambulatory Visit: Payer: BLUE CROSS/BLUE SHIELD | Admitting: Oncology

## 2018-04-30 ENCOUNTER — Ambulatory Visit: Payer: BLUE CROSS/BLUE SHIELD

## 2018-05-07 ENCOUNTER — Telehealth: Payer: Self-pay

## 2018-05-07 NOTE — Telephone Encounter (Signed)
PA came back denied. Medication is covered when the member has lost at least 5% of their starting body weight. In this case, the member has not loast 5% or more of their starting body weight.

## 2018-05-10 NOTE — Telephone Encounter (Signed)
Noted Pls inform the pt Thx

## 2018-05-12 NOTE — Telephone Encounter (Signed)
LM notifying pt

## 2018-05-13 ENCOUNTER — Other Ambulatory Visit: Payer: Self-pay | Admitting: Gynecology

## 2018-05-13 DIAGNOSIS — N644 Mastodynia: Secondary | ICD-10-CM

## 2018-05-18 ENCOUNTER — Ambulatory Visit: Payer: Self-pay

## 2018-05-18 ENCOUNTER — Ambulatory Visit
Admission: RE | Admit: 2018-05-18 | Discharge: 2018-05-18 | Disposition: A | Discharge status: Home / Self Care | Payer: BLUE CROSS/BLUE SHIELD | Source: Ambulatory Visit | Attending: Gynecology | Admitting: Gynecology

## 2018-05-18 DIAGNOSIS — N644 Mastodynia: Secondary | ICD-10-CM

## 2018-05-27 MED FILL — VENTOLIN HFA 90 MCG INHALER: 108 (90 BAS | 25 days supply | Qty: 18 | Fill #0

## 2018-06-14 ENCOUNTER — Ambulatory Visit: Payer: Self-pay | Admitting: Family Medicine

## 2018-06-14 ENCOUNTER — Ambulatory Visit: Payer: BLUE CROSS/BLUE SHIELD | Admitting: Family Medicine

## 2018-06-14 ENCOUNTER — Encounter: Payer: Self-pay | Admitting: Family Medicine

## 2018-06-14 ENCOUNTER — Ambulatory Visit (INDEPENDENT_AMBULATORY_CARE_PROVIDER_SITE_OTHER)
Admission: RE | Admit: 2018-06-14 | Discharge: 2018-06-14 | Disposition: A | Discharge status: Home / Self Care | Payer: BLUE CROSS/BLUE SHIELD | Source: Ambulatory Visit | Attending: Family Medicine | Admitting: Family Medicine

## 2018-06-14 VITALS — BP 100/60 | HR 96

## 2018-06-14 DIAGNOSIS — M940 Chondrocostal junction syndrome [Tietze]: Secondary | ICD-10-CM

## 2018-06-14 DIAGNOSIS — M999 Biomechanical lesion, unspecified: Secondary | ICD-10-CM

## 2018-06-14 DIAGNOSIS — M25511 Pain in right shoulder: Secondary | ICD-10-CM | POA: Diagnosis not present

## 2018-06-14 MED ORDER — MELOXICAM 15 MG PO TABS: 15.0000 mg | ORAL_TABLET | Freq: Every day | ORAL | 0 refills | Status: DC

## 2018-06-14 MED FILL — MELOXICAM 15 MG TABLET: 15 | 30 days supply | Qty: 30 | Fill #0

## 2018-06-14 NOTE — Assessment & Plan Note (Signed)
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

## 2018-06-14 NOTE — Progress Notes (Signed)
[name] [address] [place], [place] [address] Phone: [phone] Subjective:    I Deanna Schwartz am serving as a Education administrator for Dr. Hulan Saas.    CC: Right shoulder pain  IPJ:[id number]  Deanna Schwartz is a 37 y.o. female coming in with complaint of right shoulder pain. Patient was showering when she heard a pop in her shoulder. She is bracing the shoulder.  Patient states that it is severe pain.  Difficult to even lay down without the pain.  Rates the severity of pain is 8 out of 10.     Past Medical History:  Diagnosis Date  . Allergic rhinitis   . Anemia   . Anxiety   . Asthma   . 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 Chronic constipation   . Constipation   . Depression   . GERD (gastroesophageal reflux disease)   . H. pylori infection 2008   Hx of tx + serology  . Iron deficiency anemia 08/06/2011   Hgb 10.2 and MCV 79 on health screen labs   . Irritable bowel syndrome   . Obesity   . Palpitations   . Panic attack   . Shortness of breath   . Ulcer    Past Surgical History:  Procedure Laterality Date  . FLEXIBLE SIGMOIDOSCOPY  04/14/2008   normal  . NASAL SINUS SURGERY    . UPPER GASTROINTESTINAL ENDOSCOPY     Social History   Socioeconomic History  . Marital status: Married    Spouse name: Not on file  . Number of children: 3  . Years of education: Not on file  . Highest education level: Not on file  Occupational History  . Occupation: Financial controller [employer]    Employer: [employer]  Social Needs  . Financial resource strain: Not on file  . Food insecurity:    Worry: Not on file    Inability: Not on file  . Transportation needs:    Medical: Not on file    Non-medical: Not on file  Tobacco Use  . Smoking status: Never Smoker  . Smokeless tobacco: Never Used  Substance and Sexual Activity  . Alcohol use: Yes    Alcohol/week: 0.0 standard drinks    Comment: Social maybe twice  amonth, beer  . Drug use: No  . Sexual activity: Yes    Birth control/protection: None  Lifestyle  . Physical activity:    Days per week: Not on file    Minutes per session: Not on file  . Stress: Not on file  Relationships  . Social connections:    Talks on phone: Not on file    Gets together: Not on file    Attends religious service: Not on file    Active member of club or organization: Not on file    Attends meetings of clubs or organizations: Not on file    Relationship status: Not on file  Other Topics Concern  . Not on file  Social History Narrative   HSG, [employer] college in [place]. Occupation: [employer]. married  in Oct 2009. Son born in 2009, [place] dtrs - '07, '11. Marriage in good health.   Allergies  Allergen Reactions  . Augmentin [Amoxicillin-Pot Clavulanate] Rash  . Cephalexin Rash  . Iodine Swelling and Rash    Throat swelling after eating shrimp   Family History  Problem Relation Age of Onset  . Diabetes  Unknown        grandmother  . Hypertension Unknown   . Hypertension Mother   . Anxiety disorder Mother   . Obesity Mother   . Hypertension Father   . Diabetes Maternal Aunt   . Diabetes Maternal Aunt   . Diabetes Paternal Grandmother   . Colon polyps Paternal Uncle        x4  . Colon cancer Neg Hx       Current Outpatient Medications (Respiratory):  [name]  Albuterol Sulfate (PROAIR RESPICLICK) 469 (90 Base) MCG/ACT AEPB, Inhale 1-2 puffs into the lungs 4 (four) times daily as needed. .  beclomethasone (QVAR) 40 MCG/ACT inhaler, Inhale 2 puffs into the lungs 2 (two) times daily. .  VENTOLIN HFA 108 (90 Base) MCG/ACT inhaler, INHALE 1 TO 2 PUFFS BY MOUTH INTO THE LUNGS EVERY 6 HOURS AS NEEDED FOR WHEEZING OR SHORTNESS OF BREATH  Current Outpatient Medications (Analgesics):  .  meloxicam (MOBIC) 15 MG tablet, Take 1 tablet (15 mg total) by mouth daily.  Current Outpatient Medications (Hematological):  [name]  Cyanocobalamin (VITAMIN B-12) 1000  MCG SUBL, Place 1 tablet (1,000 mcg total) under the tongue daily.  Current Outpatient Medications (Other):  [name]  Ascorbic Acid (VITAMIN C) 1000 MG tablet, Take 1,000 mg by mouth daily. .  Cholecalciferol (VITAMIN D3) 2000 units capsule, Take 1 capsule (2,000 Units total) by mouth daily. .  Cholecalciferol (VITAMIN D3) 50000 units CAPS, Take 1 capsule by mouth once a week. .  esomeprazole (NEXIUM) 40 MG capsule, Take 40 mg by mouth daily at 12 noon. .  Lorcaserin HCl (BELVIQ) 10 MG TABS, Take 1 tablet by mouth 2 (two) times daily.    Past medical history, social, surgical and family history all reviewed in electronic medical record.  No pertanent information unless stated regarding to the chief complaint.   Review of Systems:  No headache, visual changes, nausea, vomiting, diarrhea, constipation, dizziness, abdominal pain, skin rash, fevers, chills, night sweats, weight loss, swollen lymph nodes, body aches, joint swelling,  chest pain, shortness of breath, mood changes.  Positive muscle aches  Objective  Blood pressure 100/60, pulse 96, SpO2 98 %.   General: No apparent distress alert and oriented x3 mood and affect normal, dressed appropriately.  HEENT: Pupils equal, extraocular movements intact  Respiratory: Patient's speak in full sentences and does not appear short of breath  Cardiovascular: No lower extremity edema, non tender, no erythema  Skin: Warm dry intact with no signs of infection or rash on extremities or on axial skeleton.  Abdomen: Soft nontender  Neuro: Cranial nerves II through XII are intact, neurovascularly intact in all extremities with 2+ DTRs and 2+ pulses.  Lymph: No lymphadenopathy of posterior or anterior cervical chain or axillae bilaterally.  Gait normal with good balance and coordination.  MSK:  Non tender with full range of motion and good stability and symmetric strength and tone of  elbows, wrist, hip, knee and ankles bilaterally.  Shoulder: Inspection  reveals no abnormalities, atrophy or asymmetry. Palpation is normal with no tenderness over AC joint or bicipital groove.  More pain over the [facility] joint ROM is full in all planes. Rotator cuff strength normal throughout. No signs of impingement with negative Neer and Hawkin's tests, empty can sign. Speeds and Yergason's tests normal. Mild pain with crossover around the [facility] joint Normal scapular function observed. No painful arc and no drop arm sign. No apprehension sign Contralateral shoulder unremarkable  Osteopathic findings C2 flexed rotated and side  bent right C4 flexed rotated and side bent left T3 extended rotated and side bent right inhaled third rib      Impression and Recommendations:     This case required medical decision making of moderate complexity. The above documentation has been reviewed and is accurate and complete Lyndal Pulley, DO       Note: This dictation was prepared with Dragon dictation along with smaller phrase technology. Any transcriptional errors that result from this process are unintentional.

## 2018-06-14 NOTE — Assessment & Plan Note (Signed)
xrays pending

## 2018-06-14 NOTE — Patient Instructions (Signed)
God to see you  Ice 20 minutes 2 times daily. Usually after activity and before bed. Try no lifting more then 10 pounds for next week.  Starting next week start the exercises  Meloxicam daily for 10 days then as needed See me again in 3 weeks

## 2018-06-14 NOTE — Assessment & Plan Note (Signed)
Decision today to treat with OMT was based on Physical Exam  After verbal consent patient was treated with HVLA, ME, FPR techniques in cervical, thoracic, rib areas  Patient tolerated the procedure well with improvement in symptoms  Patient given exercises, stretches and lifestyle modifications  See medications in patient instructions if given  Patient will follow up in 2-3 weeks

## 2018-06-16 ENCOUNTER — Ambulatory Visit: Payer: Self-pay | Admitting: Internal Medicine

## 2018-07-01 ENCOUNTER — Ambulatory Visit: Payer: Self-pay | Admitting: *Deleted

## 2018-07-01 NOTE — Telephone Encounter (Signed)
Patient has been experiencing intermittent wheezing for 4 days. Denies fever. Stated her chest has discomfort on and off, not dependent on exertion but when she takes a deep breath.  Rates it a 2. She has used her albuterol inhaler 3x today and doesn't feel much improvement. Stated she feels her breathing difficulty is mild and has continued to go about her daily schedule all week. "It doesn't feel like asthma to me". Reviewed symptoms that would require immediate emergency intervention-Stated she understood. Appointment made for 8:00am in the morning with Jodi Mourning, NP. Reason for Disposition . Asthma medicine (nebulizer or inhaler) is needed more frequently than q 4 hours to keep you comfortable  Answer Assessment - Initial Assessment Questions 1. RESPIRATORY STATUS: "Describe your breathing?" (e.g., wheezing, shortness of breath, unable to speak, severe coughing)      Wheezing with expiratory breaths 2. ONSET: "When did this breathing problem begin?"      On and off for 4 days 3. PATTERN "Does the difficult breathing come and go, or has it been constant since it started?"    Comes and goes as in episodes 4. SEVERITY: "How bad is your breathing?" (e.g., mild, moderate, severe)    - MILD: No SOB at rest, mild SOB with walking, speaks normally in sentences, can lay down, no retractions, pulse < 100.    - MODERATE: SOB at rest, SOB with minimal exertion and prefers to sit, cannot lie down flat, speaks in phrases, mild retractions, audible wheezing, pulse 100-120.    - SEVERE: Very SOB at rest, speaks in single words, struggling to breathe, sitting hunched forward, retractions, pulse > 120      Mild. She continues her daily activities also does not notice SOB while lying.  5. RECURRENT SYMPTOM: "Have you had difficulty breathing before?" If so, When was the last time?" and "What happened that time?"     no 6. CARDIAC HISTORY: "Do you have any history of heart disease?" (e.g., heart attack, angina,  bypass surgery, angioplasty)      no 7. LUNG HISTORY: "Do you have any history of lung disease?"  (e.g., pulmonary embolus, asthma, emphysema)     asthma 8. CAUSE: "What do you think is causing the breathing problem?"      Doesn't feel like asthma to her.  9. OTHER SYMPTOMS: "Do you have any other symptoms? (e.g., dizziness, runny nose, cough, chest pain, fever)     Discomfort in the middle of her chest intermittently.  10. PREGNANCY: "Is there any chance you are pregnant?" "When was your last menstrual period?"       no 11. TRAVEL: "Have you traveled out of the country in the last month?" (e.g., travel history, exposures)      no  Protocols used: ASTHMA ATTACK-A-AH, BREATHING DIFFICULTY-A-AH

## 2018-07-02 ENCOUNTER — Ambulatory Visit (INDEPENDENT_AMBULATORY_CARE_PROVIDER_SITE_OTHER)
Admission: RE | Admit: 2018-07-02 | Discharge: 2018-07-02 | Disposition: A | Discharge status: Home / Self Care | Payer: BLUE CROSS/BLUE SHIELD | Source: Ambulatory Visit | Attending: Family | Admitting: Family

## 2018-07-02 ENCOUNTER — Encounter: Payer: Self-pay | Admitting: Family

## 2018-07-02 ENCOUNTER — Ambulatory Visit: Payer: BLUE CROSS/BLUE SHIELD | Admitting: Family

## 2018-07-02 ENCOUNTER — Telehealth: Payer: Self-pay | Admitting: Internal Medicine

## 2018-07-02 VITALS — BP 106/74 | HR 71 | Temp 98.4°F | Ht 63.0 in | Wt 228.0 lb

## 2018-07-02 DIAGNOSIS — R0602 Shortness of breath: Secondary | ICD-10-CM

## 2018-07-02 MED ORDER — METHYLPREDNISOLONE 4 MG PO TBPK: ORAL_TABLET | ORAL | 0 refills | Status: DC

## 2018-07-02 MED ORDER — ESOMEPRAZOLE MAGNESIUM 40 MG PO CPDR: 40.0000 mg | DELAYED_RELEASE_CAPSULE | Freq: Every day | ORAL | 3 refills | Status: DC

## 2018-07-02 MED FILL — ESOMEPRAZOLE MAG DR 40 MG C: 40 | 30 days supply | Qty: 30 | Fill #0

## 2018-07-02 MED FILL — METHYLPREDNISOLONE 4 MG TAB: 4 | 6 days supply | Qty: 21 | Fill #0

## 2018-07-02 NOTE — Telephone Encounter (Signed)
Having asthma flare Saw Jodi Mourning, NP today  CXR ok (reviewed w/ her that chronic bronchitic changes stable)  She had specific ? About using Nexium again - says not having heartburn or regurg but burping increased.  I explained reasonable idea that GERD could trigger asthma and taking PPI might help. Though burping not typically from GERD.  She wants to try Qvar and see what that does alone and can add Nexium back in later if that alone does not work.  Has some chest discomfort w/ inspiration - could be musculoskeletal after frequent sneezing a few days ago  Reassured

## 2018-07-02 NOTE — Progress Notes (Signed)
Deanna Schwartz is a 37 y.o. female with the following history as recorded in Epic[employer]:  Patient Active Problem List   Diagnosis Date Noted  . Nonallopathic lesion of cervical region 06/14/2018  . Sternoclavicular joint pain, right 06/14/2018  . Trapezius muscle spasm 05/22/2017  . Knee MCL sprain 11/13/2016  . Depression 11/10/2016  . Class 2 obesity without serious comorbidity with body mass index (BMI) of 39.0 to 39.9 in adult 10/14/2016  . Nonallopathic lesion of sacral region 10/10/2016  . Panic attack 08/15/2016  . Coccydynia 06/10/2016  . Vasovagal syncope 03/14/2016  . RUQ pain 03/14/2016  . Vitamin D deficiency 11/22/2015  . 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 Nonallopathic lesion of thoracic region 02/21/2015  . Nonallopathic lesion of lumbosacral region 02/21/2015  . Nonallopathic lesion-rib cage 02/21/2015  . Piriformis syndrome of left side 02/21/2015  . Cervicalgia 01/22/2015  . Obesity (BMI 35.0-39.9 without comorbidity) 01/22/2015  . Muscle tension headache 01/22/2015  . Iron deficiency anemia due to chronic blood loss - menses 01/03/2015  . Chronic chest wall pain - mostly left side 01/03/2015  . Well adult exam 11/13/2014  . B12 deficiency 11/13/2014  . Acromioclavicular joint separation, type 1 07/12/2014  . Trochanteric bursitis of left hip 06/20/2014  . Patellofemoral pain syndrome 04/07/2014  . Acute medial meniscal tear 01/25/2014  . Weight gain 11/18/2013  . Anemia, iron deficiency 11/18/2013  . Abdominal pain, left upper quadrant-musculoskeletal 05/13/2013  . Iron deficiency anemia 08/06/2011  . Arthralgia 03/24/2011  . INSOMNIA, CHRONIC 10/11/2010  . IBS 04/07/2008  . Palpitations 01/11/2008  . Generalized anxiety disorder 11/25/2007  . Allergic rhinitis 11/25/2007  . GERD 11/25/2007  . SINUSITIS, CHRONIC 04/03/2007  . Asthma 04/03/2007    Current Outpatient Medications  Medication Sig Dispense Refill  . Ascorbic Acid (VITAMIN C) 1000 MG  tablet Take 1,000 mg by mouth daily.    . beclomethasone (QVAR) 40 MCG/ACT inhaler Inhale 2 puffs into the lungs 2 (two) times daily. 1 Inhaler 12  . Cholecalciferol (VITAMIN D3) 2000 units capsule Take 1 capsule (2,000 Units total) by mouth daily. 100 capsule 3  . Cholecalciferol (VITAMIN D3) 50000 units CAPS Take 1 capsule by mouth once a week. 8 capsule 0  . Cyanocobalamin (VITAMIN B-12) 1000 MCG SUBL Place 1 tablet (1,000 mcg total) under the tongue daily. 100 tablet 3  . esomeprazole (NEXIUM) 40 MG capsule Take 40 mg by mouth daily at 12 noon.    . Multiple Vitamin (MULTI-VITAMIN DAILY PO) Multi Vitamin    . VENTOLIN HFA 108 (90 Base) MCG/ACT inhaler INHALE 1 TO 2 PUFFS BY MOUTH INTO THE LUNGS EVERY 6 HOURS AS NEEDED FOR WHEEZING OR SHORTNESS OF BREATH 18 g 5  . esomeprazole (NEXIUM) 40 MG capsule Take 1 capsule (40 mg total) by mouth daily. 30 capsule 3  . methylPREDNISolone (MEDROL DOSEPAK) 4 MG TBPK tablet Taper as directed 21 tablet 0   No current facility-administered medications for this visit.     Allergies: Augmentin [amoxicillin-pot clavulanate]; Cephalexin; and Iodine  Past Medical History:  Diagnosis Date  . Allergic rhinitis   . Anemia   . Anxiety   . Asthma   . 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 Chronic constipation   . Constipation   . Depression   . GERD (gastroesophageal reflux disease)   . H. pylori infection 2008   Hx of tx + serology  . Iron deficiency anemia 08/06/2011   Hgb 10.2  and MCV 79 on health screen labs   . Irritable bowel syndrome   . Obesity   . Palpitations   . Panic attack   . Shortness of breath   . Ulcer     Past Surgical History:  Procedure Laterality Date  . FLEXIBLE SIGMOIDOSCOPY  04/14/2008   normal  . NASAL SINUS SURGERY    . UPPER GASTROINTESTINAL ENDOSCOPY      Family History  Problem Relation Age of Onset  . Diabetes Unknown        grandmother  . Hypertension Unknown   .  Hypertension Mother   . Anxiety disorder Mother   . Obesity Mother   . Hypertension Father   . Diabetes Maternal Aunt   . Diabetes Maternal Aunt   . Diabetes Paternal Grandmother   . Colon polyps Paternal Uncle        x4  . Colon cancer Neg Hx     Social History   Tobacco Use  . Smoking status: Never Smoker  . Smokeless tobacco: Never Used  Substance Use Topics  . Alcohol use: Yes    Alcohol/week: 0.0 standard drinks    Comment: Social maybe twice amonth, beer    Subjective:  Patient presents with concerns for 5 day history of wheezing; "feels like it is hard to catch my breath." Denies any fever or cough/ cold symptoms; no recent travel; no sick contacts at home; Does have history of asthma- does not normally need daily medication/ used QVAR last winter; history of GERD- has been having increased burping/ belching recently;   LMP- 2 weeks ago;   Objective:  Vitals:   07/02/18 0812  BP: 106/74  Pulse: 71  Temp: 98.4 F (36.9 C)  TempSrc: Oral  SpO2: 97%  Weight: 228 lb 0.6 oz (103.4 kg)  Height: 5\' 3"  (1.6 m)    General: Well developed, well nourished, in no acute distress  Skin : Warm and dry.  Head: Normocephalic and atraumatic  Eyes: Sclera and conjunctiva clear; pupils round and reactive to light; extraocular movements intact  Ears: External normal; canals clear; tympanic membranes normal  Oropharynx: Pink, supple. No suspicious lesions  Neck: Supple without thyromegaly, adenopathy  Lungs: Respirations unlabored; wheezing noted on initial exam- improved after nebulizer treatment;  CVS exam: normal rate and regular rhythm.  Neurologic: Alert and oriented; speech intact; face symmetrical; moves all extremities well; CNII-XII intact without focal deficit   Assessment:  1. Shortness of breath     Plan:  Suspect asthma flare- ? Due to GERD symptoms; albuterol nebulizer treatment given with relief; EKG updated- NSR; update CXR as well; do not feel antibiotics needed  at this time; Re-start Nexium 40 mg qd; sample of QVAR 40 2 puffs bid; patient prefers not to use oral steroids at this time- Rx is given in case symptoms worsen over the weekend; follow-up to be determined.    No follow-ups on file.  Orders Placed This Encounter  Procedures  . DG Chest 2 View    Standing Status:   Future    Standing Expiration Date:   09/02/2019    Order Specific Question:   Reason for Exam (SYMPTOM  OR DIAGNOSIS REQUIRED)    Answer:   shortness of breath    Order Specific Question:   Is patient pregnant?    Answer:   No    Order Specific Question:   Preferred imaging location?    Answer:   [facility]    Order Specific Question:  Radiology Contrast Protocol - do NOT remove file path    Answer:   [email or web address]  . EKG 12-Lead    Requested Prescriptions   Signed Prescriptions Disp Refills  . esomeprazole (NEXIUM) 40 MG capsule 30 capsule 3    Sig: Take 1 capsule (40 mg total) by mouth daily.  . methylPREDNISolone (MEDROL DOSEPAK) 4 MG TBPK tablet 21 tablet 0    Sig: Taper as directed

## 2018-07-14 ENCOUNTER — Ambulatory Visit: Payer: BLUE CROSS/BLUE SHIELD | Admitting: Internal Medicine

## 2018-07-19 ENCOUNTER — Emergency Department (HOSPITAL_COMMUNITY)
Admission: EM | Admit: 2018-07-19 | Discharge: 2018-07-20 | Disposition: A | Discharge status: Home / Self Care | Payer: BLUE CROSS/BLUE SHIELD | Attending: Emergency Medicine | Admitting: Emergency Medicine

## 2018-07-19 ENCOUNTER — Emergency Department (HOSPITAL_COMMUNITY): Payer: BLUE CROSS/BLUE SHIELD

## 2018-07-19 ENCOUNTER — Other Ambulatory Visit: Payer: Self-pay

## 2018-07-19 ENCOUNTER — Encounter (HOSPITAL_COMMUNITY): Payer: Self-pay | Admitting: Emergency Medicine

## 2018-07-19 DIAGNOSIS — R0602 Shortness of breath: Secondary | ICD-10-CM | POA: Diagnosis present

## 2018-07-19 DIAGNOSIS — J45909 Unspecified asthma, uncomplicated: Secondary | ICD-10-CM | POA: Diagnosis not present

## 2018-07-19 LAB — CBC WITH DIFFERENTIAL/PLATELET
ABS IMMATURE GRANULOCYTES: 0.05 10*3/uL (ref 0.00–0.07)
Basophils Absolute: 0 10*3/uL (ref 0.0–0.1)
Basophils Relative: 1 %
EOS PCT: 1 %
Eosinophils Absolute: 0.1 10*3/uL (ref 0.0–0.5)
HCT: 40.2 % (ref 36.0–46.0)
HEMOGLOBIN: 12.8 g/dL (ref 12.0–15.0)
Immature Granulocytes: 1 %
LYMPHS PCT: 13 %
Lymphs Abs: 1.1 10*3/uL (ref 0.7–4.0)
MCH: 29.8 pg (ref 26.0–34.0)
MCHC: 31.8 g/dL (ref 30.0–36.0)
MCV: 93.5 fL (ref 80.0–100.0)
MONO ABS: 0.1 10*3/uL (ref 0.1–1.0)
MONOS PCT: 1 %
NEUTROS ABS: 6.7 10*3/uL (ref 1.7–7.7)
Neutrophils Relative %: 83 %
Platelets: 247 10*3/uL (ref 150–400)
RBC: 4.3 MIL/uL (ref 3.87–5.11)
RDW: 12.3 % (ref 11.5–15.5)
WBC: 8 10*3/uL (ref 4.0–10.5)
nRBC: 0 % (ref 0.0–0.2)

## 2018-07-19 LAB — BASIC METABOLIC PANEL
Anion gap: 7 (ref 5–15)
BUN: 14 mg/dL (ref 6–20)
CHLORIDE: 104 mmol/L (ref 98–111)
CO2: 26 mmol/L (ref 22–32)
Calcium: 9.4 mg/dL (ref 8.9–10.3)
Creatinine, Ser: 0.85 mg/dL (ref 0.44–1.00)
GFR calc Af Amer: >60 mL/min (ref >60)
GFR calc non Af Amer: >60 mL/min (ref >60)
GLUCOSE: 114 mg/dL — AB (ref 70–99)
POTASSIUM: 3.9 mmol/L (ref 3.5–5.1)
Sodium: 137 mmol/L (ref 135–145)

## 2018-07-19 NOTE — ED Triage Notes (Signed)
Pt arriving POV with shortness o breath x1 week. Pt states she is having discomfort in her chest and towards her throat. States it feels like a heavy pressure on her. Pt reports starting Methylprednisolone tablets today and she has been taking asthma treatments as ordered.

## 2018-07-19 NOTE — ED Notes (Signed)
Pt transported to xray 

## 2018-07-20 ENCOUNTER — Encounter (HOSPITAL_COMMUNITY): Payer: Self-pay

## 2018-07-20 ENCOUNTER — Emergency Department (HOSPITAL_COMMUNITY): Payer: BLUE CROSS/BLUE SHIELD

## 2018-07-20 LAB — D-DIMER, QUANTITATIVE (NOT AT ARMC): D-Dimer, Quant: 0.31 ug/mL-FEU (ref 0.00–0.50)

## 2018-07-20 LAB — I-STAT TROPONIN, ED: Troponin i, poc: 0 ng/mL (ref 0.00–0.08)

## 2018-07-20 MED ORDER — IPRATROPIUM-ALBUTEROL 0.5-2.5 (3) MG/3ML IN SOLN
3.0000 mL | Freq: Once | RESPIRATORY_TRACT | Status: AC
  Administered 2018-07-20: 3 mL via RESPIRATORY_TRACT
  Filled 2018-07-20: qty 3

## 2018-07-20 MED ORDER — SODIUM CHLORIDE (PF) 0.9 % IJ SOLN
INTRAMUSCULAR | Status: AC
  Filled 2018-07-20: qty 50

## 2018-07-20 MED ORDER — IOPAMIDOL (ISOVUE-370) INJECTION 76%
INTRAVENOUS | Status: AC
  Filled 2018-07-20: qty 100

## 2018-07-20 MED ORDER — IOPAMIDOL (ISOVUE-370) INJECTION 76%
100.0000 mL | Freq: Once | INTRAVENOUS | Status: AC | PRN
  Administered 2018-07-20: 100 mL via INTRAVENOUS

## 2018-07-20 MED ORDER — KETOROLAC TROMETHAMINE 30 MG/ML IJ SOLN
30.0000 mg | Freq: Once | INTRAMUSCULAR | Status: AC
  Administered 2018-07-20: 30 mg via INTRAVENOUS
  Filled 2018-07-20: qty 1

## 2018-07-20 MED ORDER — ACETAMINOPHEN-CODEINE 120-12 MG/5ML PO SOLN: 10.0000 mL | ORAL | 0 refills | Status: DC | PRN

## 2018-07-20 NOTE — Discharge Instructions (Addendum)
Continue to take the steroid. Your CT scan did not show any abnormalities. Follow up with your doctor.

## 2018-07-20 NOTE — ED Notes (Addendum)
Pt ambulated in hallway with no assist. Oxygen saturations at 98% while ambulating. Pt stated she was short of breath while walking.

## 2018-07-21 NOTE — ED Provider Notes (Signed)
[facility] DEPT Provider Note   CSN: [id number] Arrival date & time: 07/19/18  1853     History   Chief Complaint Chief Complaint  Patient presents with  . Shortness of Breath    HPI Deanna Schwartz is a 37 y.o. female.  HPI Patient presents to the emergency department with chest discomfort and shortness of breath January.  The patient states is been episodic over the last few weeks worse over the last 24 hours.  Patient states that she has a history of asthma and she seen her primary care doctor about this.  They gave her a new inhaler plus steroids as well.  The patient denies  headache,blurred vision, neck pain, fever, cough, weakness, numbness, dizziness, anorexia, edema, abdominal pain, nausea, vomiting, diarrhea, rash, back pain, dysuria, hematemesis, bloody stool, near syncope, or syncope. Past Medical History:  Diagnosis Date  . Allergic rhinitis   . Anemia   . Anxiety   . Asthma   . 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 Chronic constipation   . Constipation   . Depression   . GERD (gastroesophageal reflux disease)   . H. pylori infection 2008   Hx of tx + serology  . Iron deficiency anemia 08/06/2011   Hgb 10.2 and MCV 79 on health screen labs   . Irritable bowel syndrome   . Obesity   . Palpitations   . Panic attack   . Shortness of breath   . Ulcer     Patient Active Problem List   Diagnosis Date Noted  . Nonallopathic lesion of cervical region 06/14/2018  . Sternoclavicular joint pain, right 06/14/2018  . Trapezius muscle spasm 05/22/2017  . Knee MCL sprain 11/13/2016  . Depression 11/10/2016  . Class 2 obesity without serious comorbidity with body mass index (BMI) of 39.0 to 39.9 in adult 10/14/2016  . Nonallopathic lesion of sacral region 10/10/2016  . Panic attack 08/15/2016  . Coccydynia 06/10/2016  . Vasovagal syncope 03/14/2016  . RUQ pain 03/14/2016  . Vitamin D  deficiency 11/22/2015  . 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 Nonallopathic lesion of thoracic region 02/21/2015  . Nonallopathic lesion of lumbosacral region 02/21/2015  . Nonallopathic lesion-rib cage 02/21/2015  . Piriformis syndrome of left side 02/21/2015  . Cervicalgia 01/22/2015  . Obesity (BMI 35.0-39.9 without comorbidity) 01/22/2015  . Muscle tension headache 01/22/2015  . Iron deficiency anemia due to chronic blood loss - menses 01/03/2015  . Chronic chest wall pain - mostly left side 01/03/2015  . Well adult exam 11/13/2014  . B12 deficiency 11/13/2014  . Acromioclavicular joint separation, type 1 07/12/2014  . Trochanteric bursitis of left hip 06/20/2014  . Patellofemoral pain syndrome 04/07/2014  . Acute medial meniscal tear 01/25/2014  . Weight gain 11/18/2013  . Anemia, iron deficiency 11/18/2013  . Abdominal pain, left upper quadrant-musculoskeletal 05/13/2013  . Iron deficiency anemia 08/06/2011  . Arthralgia 03/24/2011  . INSOMNIA, CHRONIC 10/11/2010  . IBS 04/07/2008  . Palpitations 01/11/2008  . Generalized anxiety disorder 11/25/2007  . Allergic rhinitis 11/25/2007  . GERD 11/25/2007  . SINUSITIS, CHRONIC 04/03/2007  . Asthma 04/03/2007    Past Surgical History:  Procedure Laterality Date  . FLEXIBLE SIGMOIDOSCOPY  04/14/2008   normal  . NASAL SINUS SURGERY    . UPPER GASTROINTESTINAL ENDOSCOPY       OB History    Gravida  4   Para  3   Term  3  Preterm      AB  1   Living  3     SAB  1   TAB      Ectopic      Multiple      Live Births               Home Medications    Prior to Admission medications   Medication Sig Start Date End Date Taking? Authorizing Provider  beclomethasone (QVAR) 40 MCG/ACT inhaler Inhale 2 puffs into the lungs 2 (two) times daily. 10/13/17  Yes Plotnikov, Evie Lacks, MD  esomeprazole (NEXIUM) 40 MG capsule Take 1 capsule (40 mg total) by mouth daily. 07/02/18  Yes Marrian Salvage, FNP    methylPREDNISolone (MEDROL DOSEPAK) 4 MG TBPK tablet Taper as directed 07/02/18  Yes Marrian Salvage, FNP  Multiple Vitamin (MULTI-VITAMIN DAILY PO) Take 1 tablet by mouth daily.    Yes [provider]  VENTOLIN HFA 108 (90 Base) MCG/ACT inhaler INHALE 1 TO 2 PUFFS BY MOUTH INTO THE LUNGS EVERY 6 HOURS AS NEEDED FOR WHEEZING OR SHORTNESS OF BREATH Patient taking differently: Inhale 1-2 puffs into the lungs every 6 (six) hours as needed for shortness of breath.  04/29/18  Yes Plotnikov, Evie Lacks, MD  acetaminophen-codeine 120-12 MG/5ML solution Take 10 mLs by mouth every 4 (four) hours as needed for moderate pain (and cough). 07/20/18   Austin Pongratz, Harrell Gave, PA-C  Cholecalciferol (VITAMIN D3) 2000 units capsule Take 1 capsule (2,000 Units total) by mouth daily. Patient not taking: Reported on 07/20/2018 04/24/17   Plotnikov, Evie Lacks, MD  Cholecalciferol (VITAMIN D3) 50000 units CAPS Take 1 capsule by mouth once a week. Patient not taking: Reported on 07/20/2018 04/23/18   Plotnikov, Evie Lacks, MD  Cyanocobalamin (VITAMIN B-12) 1000 MCG SUBL Place 1 tablet (1,000 mcg total) under the tongue daily. Patient not taking: Reported on 07/20/2018 04/23/18   Plotnikov, Evie Lacks, MD    Family History Family History  Problem Relation Age of Onset  . Diabetes Unknown        grandmother  . Hypertension Unknown   . Hypertension Mother   . Anxiety disorder Mother   . Obesity Mother   . Hypertension Father   . Diabetes Maternal Aunt   . Diabetes Maternal Aunt   . Diabetes Paternal Grandmother   . Colon polyps Paternal Uncle        x4  . Colon cancer Neg Hx     Social History Social History   Tobacco Use  . Smoking status: Never Smoker  . Smokeless tobacco: Never Used  Substance Use Topics  . Alcohol use: Yes    Alcohol/week: 0.0 standard drinks    Comment: Social maybe twice amonth, beer  . Drug use: No     Allergies   Augmentin [amoxicillin-pot clavulanate]; Cephalexin;  and Iodine   Review of Systems Review of Systems All other systems negative except as documented in the HPI. All pertinent positives and negatives as reviewed in the HPI.  Physical Exam Updated Vital Signs BP 108/72 (BP Location: Right Arm)   Pulse 85   Temp 97.7 F (36.5 C) (Oral)   Resp 14   Ht 5\' 3"  (1.6 m)   Wt 104.3 kg   LMP 07/10/2018   SpO2 100%   BMI 40.74 kg/m   Physical Exam  Constitutional: She is oriented to person, place, and time. She appears well-developed and well-nourished. No distress.  HENT:  Head: Normocephalic and atraumatic.  Mouth/Throat: Oropharynx  is clear and moist.  Eyes: Pupils are equal, round, and reactive to light.  Neck: Normal range of motion. Neck supple.  Cardiovascular: Normal rate, regular rhythm and normal heart sounds. Exam reveals no gallop and no friction rub.  No murmur heard. Pulmonary/Chest: Effort normal and breath sounds normal. No respiratory distress. She has no decreased breath sounds. She has no wheezes. She has no rhonchi. She has no rales.  Abdominal: Soft. Bowel sounds are normal. She exhibits no distension. There is no tenderness.  Neurological: She is alert and oriented to person, place, and time. She exhibits normal muscle tone. Coordination normal.  Skin: Skin is warm and dry. Capillary refill takes less than 2 seconds. No rash noted. No erythema.  Psychiatric: She has a normal mood and affect. Her behavior is normal.  Nursing note and vitals reviewed.    ED Treatments / Results  Labs (all labs ordered are listed, but only abnormal results are displayed) Labs Reviewed  BASIC METABOLIC PANEL - Abnormal; Notable for the following components:      Result Value   Glucose, Bld 114 (*)    All other components within normal limits  CBC WITH DIFFERENTIAL/PLATELET  D-DIMER, QUANTITATIVE (NOT AT [facility])  I-STAT TROPONIN, ED    EKG None  Radiology Dg Chest 2 View  Result Date: 07/19/2018 CLINICAL DATA:  Shortness  of breath. EXAM: CHEST - 2 VIEW COMPARISON:  July 02, 2018 FINDINGS: The heart size and mediastinal contours are within normal limits. Both lungs are clear. The visualized skeletal structures are unremarkable. IMPRESSION: No active cardiopulmonary disease. Electronically Signed   By: Dorise Bullion III M.D   On: 07/19/2018 22:31   Ct Angio Chest Pe W/cm &/or Wo Cm  Result Date: 07/20/2018 CLINICAL DATA:  Acute onset of shortness of breath. Upper chest discomfort. EXAM: CT ANGIOGRAPHY CHEST WITH CONTRAST TECHNIQUE: Multidetector CT imaging of the chest was performed using the standard protocol during bolus administration of intravenous contrast. Multiplanar CT image reconstructions and MIPs were obtained to evaluate the vascular anatomy. CONTRAST:  14mL ISOVUE-370 IOPAMIDOL (ISOVUE-370) INJECTION 76% COMPARISON:  Chest radiograph performed 07/19/2018 FINDINGS: Cardiovascular:  There is no evidence of pulmonary embolus. The heart is normal in size. The thoracic aorta is unremarkable. The great vessels are within normal limits. Mediastinum/Nodes: Mediastinum is unremarkable in appearance. No mediastinal lymphadenopathy is seen. No pericardial effusion is identified. The visualized portions of the thyroid gland are unremarkable. No axillary lymphadenopathy is seen. Lungs/Pleura: The lungs are clear bilaterally. No focal consolidation, pleural effusion or pneumothorax is seen. No masses are identified. Upper Abdomen: The visualized portions of the liver and spleen are unremarkable. The visualized portions of the pancreas, adrenal glands and kidneys are within normal limits. Musculoskeletal: No acute osseous abnormalities are identified. The visualized musculature is unremarkable in appearance. Review of the MIP images confirms the above findings. IMPRESSION: No evidence of pulmonary embolus. Lungs clear bilaterally. Electronically Signed   By: Garald Balding M.D.   On: 07/20/2018 04:56     Procedures Procedures (including critical care time)  Medications Ordered in ED Medications  ketorolac (TORADOL) 30 MG/ML injection 30 mg (30 mg Intravenous Given 07/20/18 0206)  iopamidol (ISOVUE-370) 76 % injection 100 mL (100 mLs Intravenous Contrast Given 07/20/18 0420)  ipratropium-albuterol (DUONEB) 0.5-2.5 (3) MG/3ML nebulizer solution 3 mL (3 mLs Nebulization Given 07/20/18 0507)     Initial Impression / Assessment and Plan / ED Course  I have reviewed the triage vital signs and the nursing notes.  Pertinent labs & imaging results that were available during my care of the patient were reviewed by me and considered in my medical decision making (see chart for details).    She had CTA of her chest which was negative.  The patient is feeling better.  She is rather anxious about most of the work-up and condition.  Patient is advised to return here as needed.  Told to follow-up with her primary doctor.   Final Clinical Impressions(s) / ED Diagnoses   Final diagnoses:  Shortness of breath    ED Discharge Orders         Ordered    acetaminophen-codeine 120-12 MG/5ML solution  Every 4 hours PRN     07/20/18 0617           Dalia Heading, PA-C 07/21/18 0600    Molpus, Jenny Reichmann, MD 07/21/18 [phone]

## 2018-07-22 ENCOUNTER — Other Ambulatory Visit: Payer: Self-pay

## 2018-07-22 DIAGNOSIS — D509 Iron deficiency anemia, unspecified: Secondary | ICD-10-CM

## 2018-07-26 ENCOUNTER — Other Ambulatory Visit: Payer: Self-pay

## 2018-07-26 ENCOUNTER — Inpatient Hospital Stay: Payer: BLUE CROSS/BLUE SHIELD

## 2018-07-26 ENCOUNTER — Other Ambulatory Visit (INDEPENDENT_AMBULATORY_CARE_PROVIDER_SITE_OTHER): Payer: BLUE CROSS/BLUE SHIELD

## 2018-07-26 DIAGNOSIS — D649 Anemia, unspecified: Secondary | ICD-10-CM

## 2018-07-26 LAB — COMPREHENSIVE METABOLIC PANEL
ALT: 23 U/L (ref 0–35)
AST: 19 U/L (ref 0–37)
Albumin: 4.2 g/dL (ref 3.5–5.2)
Alkaline Phosphatase: 61 U/L (ref 39–117)
BUN: 17 mg/dL (ref 6–23)
CHLORIDE: 101 meq/L (ref 96–112)
CO2: 28 meq/L (ref 19–32)
CREATININE: 0.82 mg/dL (ref 0.40–1.20)
Calcium: 9.4 mg/dL (ref 8.4–10.5)
GFR: 101.19 mL/min (ref >60.00)
Glucose, Bld: 82 mg/dL (ref 70–99)
POTASSIUM: 4.4 meq/L (ref 3.5–5.1)
SODIUM: 138 meq/L (ref 135–145)
Total Bilirubin: 0.3 mg/dL (ref 0.2–1.2)
Total Protein: 7.8 g/dL (ref 6.0–8.3)

## 2018-07-26 LAB — VITAMIN B12: Vitamin B-12: 339 pg/mL (ref 211–911)

## 2018-07-26 LAB — CBC WITH DIFFERENTIAL/PLATELET
BASOS PCT: 0.4 % (ref 0.0–3.0)
Basophils Absolute: 0.1 10*3/uL (ref 0.0–0.1)
EOS ABS: 0.4 10*3/uL (ref 0.0–0.7)
Eosinophils Relative: 2.9 % (ref 0.0–5.0)
HCT: 40.5 % (ref 36.0–46.0)
Hemoglobin: 13.5 g/dL (ref 12.0–15.0)
Lymphocytes Relative: 22.3 % (ref 12.0–46.0)
Lymphs Abs: 2.8 10*3/uL (ref 0.7–4.0)
MCHC: 33.4 g/dL (ref 30.0–36.0)
MCV: 88.9 fl (ref 78.0–100.0)
MONO ABS: 0.9 10*3/uL (ref 0.1–1.0)
Monocytes Relative: 7.3 % (ref 3.0–12.0)
NEUTROS ABS: 8.5 10*3/uL — AB (ref 1.4–7.7)
Neutrophils Relative %: 67.1 % (ref 43.0–77.0)
PLATELETS: 287 10*3/uL (ref 150.0–400.0)
RBC: 4.56 Mil/uL (ref 3.87–5.11)
RDW: 13.2 % (ref 11.5–15.5)
WBC: 12.6 10*3/uL — ABNORMAL HIGH (ref 4.0–10.5)

## 2018-07-26 LAB — IBC PANEL
IRON: 45 ug/dL (ref 42–145)
SATURATION RATIOS: 10.7 % — AB (ref 20.0–50.0)
TRANSFERRIN: 300 mg/dL (ref 212.0–360.0)

## 2018-07-26 LAB — FERRITIN: Ferritin: 45.9 ng/mL (ref 10.0–291.0)

## 2018-07-27 ENCOUNTER — Encounter: Payer: Self-pay | Admitting: Internal Medicine

## 2018-07-27 ENCOUNTER — Ambulatory Visit: Payer: BLUE CROSS/BLUE SHIELD | Admitting: Internal Medicine

## 2018-07-27 DIAGNOSIS — D509 Iron deficiency anemia, unspecified: Secondary | ICD-10-CM | POA: Diagnosis not present

## 2018-07-27 DIAGNOSIS — Z6839 Body mass index (BMI) 39.0-39.9, adult: Secondary | ICD-10-CM

## 2018-07-27 DIAGNOSIS — F411 Generalized anxiety disorder: Secondary | ICD-10-CM | POA: Diagnosis not present

## 2018-07-27 DIAGNOSIS — J4520 Mild intermittent asthma, uncomplicated: Secondary | ICD-10-CM | POA: Diagnosis not present

## 2018-07-27 DIAGNOSIS — F410 Panic disorder [episodic paroxysmal anxiety] without agoraphobia: Secondary | ICD-10-CM

## 2018-07-27 DIAGNOSIS — E669 Obesity, unspecified: Secondary | ICD-10-CM | POA: Diagnosis not present

## 2018-07-27 MED ORDER — VILAZODONE HCL 20 MG PO TABS: 20.0000 mg | ORAL_TABLET | Freq: Every day | ORAL | 6 refills | Status: DC

## 2018-07-27 NOTE — Assessment & Plan Note (Signed)
Try Viibrid

## 2018-07-27 NOTE — Assessment & Plan Note (Signed)
Wt Readings from Last 3 Encounters:  07/27/18 228 lb (103.4 kg)  07/19/18 230 lb (104.3 kg)  07/02/18 228 lb 0.6 oz (103.4 kg)

## 2018-07-27 NOTE — Progress Notes (Signed)
Subjective:  Patient ID: Deanna Schwartz, female    DOB: September 08, 1981  Age: 37 y.o. MRN: [id number]  CC: No chief complaint on file.   HPI [facility] presents for SOB attack, s/p OV and ER visit C/o L knee pain  Outpatient Medications Prior to Visit  Medication Sig Dispense Refill  . beclomethasone (QVAR) 40 MCG/ACT inhaler Inhale 2 puffs into the lungs 2 (two) times daily. 1 Inhaler 12  . Cholecalciferol (VITAMIN D3) 2000 units capsule Take 1 capsule (2,000 Units total) by mouth daily. 100 capsule 3  . Cholecalciferol (VITAMIN D3) 50000 units CAPS Take 1 capsule by mouth once a week. 8 capsule 0  . Cyanocobalamin (VITAMIN B-12) 1000 MCG SUBL Place 1 tablet (1,000 mcg total) under the tongue daily. 100 tablet 3  . esomeprazole (NEXIUM) 40 MG capsule Take 1 capsule (40 mg total) by mouth daily. 30 capsule 3  . Multiple Vitamin (MULTI-VITAMIN DAILY PO) Take 1 tablet by mouth daily.     . VENTOLIN HFA 108 (90 Base) MCG/ACT inhaler INHALE 1 TO 2 PUFFS BY MOUTH INTO THE LUNGS EVERY 6 HOURS AS NEEDED FOR WHEEZING OR SHORTNESS OF BREATH (Patient taking differently: Inhale 1-2 puffs into the lungs every 6 (six) hours as needed for shortness of breath. ) 18 g 5  . acetaminophen-codeine 120-12 MG/5ML solution Take 10 mLs by mouth every 4 (four) hours as needed for moderate pain (and cough). 240 mL 0  . methylPREDNISolone (MEDROL DOSEPAK) 4 MG TBPK tablet Taper as directed 21 tablet 0   No facility-administered medications prior to visit.     ROS: Review of Systems  Constitutional: Negative for activity change, appetite change, chills, fatigue and unexpected weight change.  HENT: Negative for congestion, mouth sores and sinus pressure.   Eyes: Negative for visual disturbance.  Respiratory: Negative for cough and chest tightness.   Cardiovascular: Negative for chest pain and leg swelling.  Gastrointestinal: Negative for abdominal pain and nausea.  Genitourinary: Negative for difficulty  urinating, frequency and vaginal pain.  Musculoskeletal: Positive for arthralgias and gait problem. Negative for back pain.  Skin: Negative for pallor and rash.  Neurological: Negative for dizziness, tremors, weakness, numbness and headaches.  Psychiatric/Behavioral: Negative for confusion, sleep disturbance and suicidal ideas. The patient is nervous/anxious.     Objective:  BP 110/74 (BP Location: Left Arm, Patient Position: Sitting, Cuff Size: Large)   Pulse 79   Temp 98.3 F (36.8 C) (Oral)   Ht 5\' 3"  (1.6 m)   Wt 228 lb (103.4 kg)   LMP 07/10/2018   SpO2 99%   BMI 40.39 kg/m   BP Readings from Last 3 Encounters:  07/27/18 110/74  07/20/18 108/72  07/02/18 106/74    Wt Readings from Last 3 Encounters:  07/27/18 228 lb (103.4 kg)  07/19/18 230 lb (104.3 kg)  07/02/18 228 lb 0.6 oz (103.4 kg)    Physical Exam  Constitutional: She appears well-developed. No distress.  HENT:  Head: Normocephalic.  Right Ear: External ear normal.  Left Ear: External ear normal.  Nose: Nose normal.  Mouth/Throat: Oropharynx is clear and moist.  Eyes: Pupils are equal, round, and reactive to light. Conjunctivae are normal. Right eye exhibits no discharge. Left eye exhibits no discharge.  Neck: Normal range of motion. Neck supple. No JVD present. No tracheal deviation present. No thyromegaly present.  Cardiovascular: Normal rate, regular rhythm and normal heart sounds.  Pulmonary/Chest: No stridor. No respiratory distress. She has no wheezes.  Abdominal: Soft. Bowel sounds  are normal. She exhibits no distension and no mass. There is no tenderness. There is no rebound and no guarding.  Musculoskeletal: She exhibits tenderness. She exhibits no edema.  Lymphadenopathy:    She has no cervical adenopathy.  Neurological: She displays normal reflexes. No cranial nerve deficit. She exhibits normal muscle tone. Coordination normal.  Skin: No rash noted. No erythema.  Psychiatric: She has a normal  mood and affect. Her behavior is normal. Judgment and thought content normal.  Obese L knee - tender  Lab Results  Component Value Date   WBC 12.6 (H) 07/26/2018   HGB 13.5 07/26/2018   HCT 40.5 07/26/2018   PLT 287.0 07/26/2018   GLUCOSE 82 07/26/2018   CHOL 158 10/13/2017   TRIG 215.0 (H) 10/13/2017   HDL 49.70 10/13/2017   LDLDIRECT 86.0 10/13/2017   LDLCALC 106 (H) 08/26/2016   ALT 23 07/26/2018   AST 19 07/26/2018   NA 138 07/26/2018   K 4.4 07/26/2018   CL 101 07/26/2018   CREATININE 0.82 07/26/2018   BUN 17 07/26/2018   CO2 28 07/26/2018   TSH 2.42 10/13/2017   HGBA1C 5.7 12/16/2016    Dg Chest 2 View  Result Date: 07/19/2018 CLINICAL DATA:  Shortness of breath. EXAM: CHEST - 2 VIEW COMPARISON:  July 02, 2018 FINDINGS: The heart size and mediastinal contours are within normal limits. Both lungs are clear. The visualized skeletal structures are unremarkable. IMPRESSION: No active cardiopulmonary disease. Electronically Signed   By: Dorise Bullion III M.D   On: 07/19/2018 22:31   Ct Angio Chest Pe W/cm &/or Wo Cm  Result Date: 07/20/2018 CLINICAL DATA:  Acute onset of shortness of breath. Upper chest discomfort. EXAM: CT ANGIOGRAPHY CHEST WITH CONTRAST TECHNIQUE: Multidetector CT imaging of the chest was performed using the standard protocol during bolus administration of intravenous contrast. Multiplanar CT image reconstructions and MIPs were obtained to evaluate the vascular anatomy. CONTRAST:  155mL ISOVUE-370 IOPAMIDOL (ISOVUE-370) INJECTION 76% COMPARISON:  Chest radiograph performed 07/19/2018 FINDINGS: Cardiovascular:  There is no evidence of pulmonary embolus. The heart is normal in size. The thoracic aorta is unremarkable. The great vessels are within normal limits. Mediastinum/Nodes: Mediastinum is unremarkable in appearance. No mediastinal lymphadenopathy is seen. No pericardial effusion is identified. The visualized portions of the thyroid gland are  unremarkable. No axillary lymphadenopathy is seen. Lungs/Pleura: The lungs are clear bilaterally. No focal consolidation, pleural effusion or pneumothorax is seen. No masses are identified. Upper Abdomen: The visualized portions of the liver and spleen are unremarkable. The visualized portions of the pancreas, adrenal glands and kidneys are within normal limits. Musculoskeletal: No acute osseous abnormalities are identified. The visualized musculature is unremarkable in appearance. Review of the MIP images confirms the above findings. IMPRESSION: No evidence of pulmonary embolus. Lungs clear bilaterally. Electronically Signed   By: Garald Balding M.D.   On: 07/20/2018 04:56    Assessment & Plan:   There are no diagnoses linked to this encounter.   No orders of the defined types were placed in this encounter.    Follow-up: No follow-ups on file.  [name], MD

## 2018-07-27 NOTE — Assessment & Plan Note (Addendum)
Qvar, Ventolin Recent panic attack vs other Chest CTA was nl

## 2018-07-27 NOTE — Patient Instructions (Signed)
Patellofemoral Pain Syndrome Patellofemoral pain syndrome is a condition that involves a softening or breakdown of the tissue (cartilage) on the underside of your kneecap (patella). This causes pain in the front of the knee. The condition is also called runner's knee or chondromalacia patella. Patellofemoral pain syndrome is most common in young adults who are active in sports. Your knee is the largest joint in your body. The patella covers the front of your knee and is attached to muscles above and below your knee. The underside of the patella is covered with a smooth type of cartilage (synovium). The smooth surface helps the patella glide easily when you move your knee. Patellofemoral pain syndrome causes swelling in the joint linings and bone surfaces in your knee. What are the causes? Patellofemoral pain syndrome can be caused by:  Overuse.  Poor alignment of your knee joints.  Weak leg muscles.  A direct blow to your kneecap.  What increases the risk? You may be at risk for patellofemoral pain syndrome if you:  Do a lot of activities that can wear down your kneecap. These include: ? Running. ? Squatting. ? Climbing stairs.  Start a new physical activity or exercise program.  Wear shoes that do not fit well.  Do not have good leg strength.  Are overweight.  What are the signs or symptoms? Knee pain is the most common symptom of patellofemoral pain syndrome. This may feel like a dull, aching pain underneath your patella, in the front of your knee. There may be a popping or cracking sound when you move your knee. Pain may get worse with:  Exercise.  Climbing stairs.  Running.  Jumping.  Squatting.  Kneeling.  Sitting for a long time.  Moving or pushing on your patella.  How is this diagnosed? Your health care provider may be able to diagnose patellofemoral pain syndrome from your symptoms and medical history. You may be asked about your recent physical activities  and which ones cause knee pain. Your health care provider may do a physical exam with certain tests to confirm the diagnosis. These may include:  Moving your patella back and forth.  Checking your range of knee motion.  Having you squat or jump to see if you have pain.  Checking the strength of your leg muscles.  An MRI of the knee may also be done. How is this treated? Patellofemoral pain syndrome can usually be treated at home with rest, ice, compression, and elevation (RICE). Other treatments may include:  Nonsteroidal anti-inflammatory drugs (NSAIDs).  Physical therapy to stretch and strengthen your leg muscles.  Shoe inserts (orthotics) to take stress off your knee.  A knee brace or knee support.  Surgery to remove damaged cartilage or move the patella to a better position. The need for surgery is rare.  Follow these instructions at home:  Take medicines only as directed by your health care provider.  Rest your knee. ? When resting, keep your knee raised above the level of your heart. ? Avoid activities that cause knee pain.  Apply ice to the injured area: ? Put ice in a plastic bag. ? Place a towel between your skin and the bag. ? Leave the ice on for 20 minutes, 2-3 times a day.  Use splints, braces, knee supports, or walking aids as directed by your health care provider.  Perform stretching and strengthening exercises as directed by your health care provider or physical therapist.  Keep all follow-up visits as directed by your health care   provider. This is important. Contact a health care provider if:  Your symptoms get worse.  You are not improving with home care. This information is not intended to replace advice given to you by your health care provider. Make sure you discuss any questions you have with your health care provider. Document Released: 08/13/2009 Document Revised: 01/31/2016 Document Reviewed: 11/14/2013 Elsevier Interactive Patient Education   2018 [employer]  

## 2018-07-27 NOTE — Assessment & Plan Note (Signed)
Better  

## 2018-07-27 NOTE — Assessment & Plan Note (Signed)
Recent panic attack vs other Chest CTA was nl

## 2018-07-27 NOTE — Progress Notes (Signed)
Labs look ok but ferritin is less than it was  Cc dr. Alen Blew

## 2018-07-28 ENCOUNTER — Inpatient Hospital Stay: Payer: BLUE CROSS/BLUE SHIELD

## 2018-07-28 ENCOUNTER — Inpatient Hospital Stay: Payer: BLUE CROSS/BLUE SHIELD | Attending: Hematology and Oncology | Admitting: Oncology

## 2018-07-28 VITALS — BP 117/62 | HR 89 | Temp 98.3°F | Resp 18 | Ht 63.0 in | Wt 230.8 lb

## 2018-07-28 DIAGNOSIS — N920 Excessive and frequent menstruation with regular cycle: Secondary | ICD-10-CM | POA: Diagnosis not present

## 2018-07-28 DIAGNOSIS — D500 Iron deficiency anemia secondary to blood loss (chronic): Secondary | ICD-10-CM

## 2018-07-28 DIAGNOSIS — D509 Iron deficiency anemia, unspecified: Secondary | ICD-10-CM

## 2018-07-28 DIAGNOSIS — Z79899 Other long term (current) drug therapy: Secondary | ICD-10-CM | POA: Diagnosis not present

## 2018-07-28 DIAGNOSIS — E538 Deficiency of other specified B group vitamins: Secondary | ICD-10-CM | POA: Insufficient documentation

## 2018-07-28 NOTE — Progress Notes (Signed)
Hematology and Oncology Follow Up Visit  Deanna Schwartz [id number] 07/13/1981 37 y.o. 07/28/2018 10:05 AM Deanna Schwartz, MDPlotnikov, Deanna Lacks, MD   Principle Diagnosis: 37 year old woman with iron deficiency anemia that has been recurring since at least 2010 related to chronic menstrual blood losses.   Prior Therapy: Status post Feraheme infusion completed on Jan 06, 2018 for a total of 1020 mg.  Current therapy: Oral iron therapy  Interim History: Ms. Deanna Schwartz presents today for a follow-up visit.  She is a pleasant 37 year old woman I have seen in the past by Dr. Lebron Schwartz for diagnosis of iron deficiency anemia.  Her anemia is related to chronic menstrual blood losses due to menorrhagia and has been on iron replacement therapy orally intermittently since her pregnancies.  She had iron deficiency dating back to 2010 but reestablish care in March 2019 where she presented with ferritin of 8, iron level of 23.  She received Feraheme infusion for a total of 1020 mg daily completed on Jan 06, 2018 with excellent improvement in her energy and performance status.  She continues to have heavy menstrual cycles that last about 7 days.  She denies any GI bleeding, hemoptysis or hematemesis.  She does take oral iron therapy although has reported some issues with constipation.  Her performance status and quality of life remain excellent.  She does not report any headaches, blurry vision, syncope or seizures. Does not report any fevers, chills or sweats.  Does not report any cough, wheezing or hemoptysis.  Does not report any chest pain, palpitation, orthopnea or leg edema.  Does not report any nausea, vomiting or abdominal pain.  Does not report any constipation or diarrhea.  Does not report any skeletal complaints.    Does not report frequency, urgency or hematuria.  Does not report any skin rashes or lesions. Does not report any heat or cold intolerance.  Does not report any lymphadenopathy or petechiae.   Does not report any anxiety or depression.  Remaining review of systems is negative.    Medications: I have reviewed the patient's current medications.  Current Outpatient Medications  Medication Sig Dispense Refill  . beclomethasone (QVAR) 40 MCG/ACT inhaler Inhale 2 puffs into the lungs 2 (two) times daily. 1 Inhaler 12  . Cholecalciferol (VITAMIN D3) 2000 units capsule Take 1 capsule (2,000 Units total) by mouth daily. 100 capsule 3  . Cyanocobalamin (VITAMIN B-12) 1000 MCG SUBL Place 1 tablet (1,000 mcg total) under the tongue daily. 100 tablet 3  . esomeprazole (NEXIUM) 40 MG capsule Take 1 capsule (40 mg total) by mouth daily. 30 capsule 3  . Multiple Vitamin (MULTI-VITAMIN DAILY PO) Take 1 tablet by mouth daily.     . VENTOLIN HFA 108 (90 Base) MCG/ACT inhaler INHALE 1 TO 2 PUFFS BY MOUTH INTO THE LUNGS EVERY 6 HOURS AS NEEDED FOR WHEEZING OR SHORTNESS OF BREATH (Patient taking differently: Inhale 1-2 puffs into the lungs every 6 (six) hours as needed for shortness of breath. ) 18 g 5  . Vilazodone HCl (VIIBRYD) 20 MG TABS Take 1 tablet (20 mg total) by mouth daily. 30 tablet 6   No current facility-administered medications for this visit.      Allergies:  Allergies  Allergen Reactions  . Augmentin [Amoxicillin-Pot Clavulanate] Rash  . Cephalexin Rash  . Iodine Swelling and Rash    Throat swelling after eating shrimp    Past Medical History, Surgical history, Social history, and Family History were reviewed and updated.  Review of Systems:  Remaining ROS negative.  Physical Exam: Blood pressure 117/62, pulse 89, temperature 98.3 F (36.8 C), temperature source Oral, resp. rate 18, height 5\' 3"  (1.6 m), weight 230 lb 12.8 oz (104.7 kg), last menstrual period 07/10/2018, SpO2 100 %. ECOG: 0 General appearance: alert and cooperative appeared without distress. Head: Normocephalic, without obvious abnormality Oropharynx: No oral thrush or ulcers. Eyes: No scleral icterus.   Pupils are equal and round reactive to light. Lymph nodes: Cervical, supraclavicular, and axillary nodes normal. Heart:regular rate and rhythm, S1, S2 normal, no murmur, click, rub or gallop Lung:chest clear, no wheezing, rales, normal symmetric air entry Abdomin: soft, non-tender, without masses or organomegaly. Neurological: No motor, sensory deficits.  Intact deep tendon reflexes. Skin: No rashes or lesions.  No ecchymosis or petechiae. Musculoskeletal: No joint deformity or effusion. Psychiatric: Mood and affect are appropriate.    Lab Results: Lab Results  Component Value Date   WBC 12.6 (H) 07/26/2018   HGB 13.5 07/26/2018   HCT 40.5 07/26/2018   MCV 88.9 07/26/2018   PLT 287.0 07/26/2018     Chemistry      Component Value Date/Time   NA 138 07/26/2018 0931   NA 141 08/26/2016 1145   K 4.4 07/26/2018 0931   CL 101 07/26/2018 0931   CO2 28 07/26/2018 0931   BUN 17 07/26/2018 0931   BUN 13 08/26/2016 1145   CREATININE 0.82 07/26/2018 0931   CREATININE 0.81 12/28/2017 1114   CREATININE 0.74 05/13/2013 1024      Component Value Date/Time   CALCIUM 9.4 07/26/2018 0931   ALKPHOS 61 07/26/2018 0931   AST 19 07/26/2018 0931   AST 25 12/28/2017 1114   ALT 23 07/26/2018 0931   ALT 21 12/28/2017 1114   BILITOT 0.3 07/26/2018 0931   BILITOT 0.3 12/28/2017 1114       Impression and Plan:   37 year old woman with the following:  1.  Iron deficiency anemia related to chronic menstrual blood losses dating back to at least 2010.  She was evaluated again in 2019 and received intravenous iron in May of this year.  Iron studies obtained on July 2019 as well as November 2019 were personally reviewed and discussed with the patient today.  Her iron level has improved up to 45 and ferritin also has improved to 116 but has declined to 45.  Her hemoglobin currently at 13.5 and she is asymptomatic.  The natural course of her disease as well as treatment options were discussed  again today.  Options would include continuing oral iron therapy or repeat intravenous iron either now or in the future.  After discussion today, I recommended continuing oral iron therapy and attempt to take it on a regular basis.  I will recheck her iron studies in 3 months and consider repeat Feraheme infusion at the time.  Complication associated with Feraheme infusion were reiterated which include arthralgias, myalgias and rarely anaphylaxis.  She is agreeable to proceed with this plan.  2.  Heavy menstrual cycles: Continues to be the cause of her worsening iron deficiency.  I recommended follow-up with gynecology regarding this issue.  3.  Follow-up: We will be in 3 months sooner if needed.  15  minutes was spent with the patient face-to-face today.  More than 50% of time was dedicated to discussing her disease status, reviewing laboratory data, discussing treatment options complications related to therapy.    Zola Button, MD 11/20/201910:05 AM

## 2018-07-29 ENCOUNTER — Telehealth: Payer: Self-pay

## 2018-07-29 NOTE — Telephone Encounter (Signed)
Spoke with patient concerning her upcoming appointment. Per 11/20 los. Scheduled patient based on her only being able to come in during the morning.

## 2018-09-30 MED FILL — VENTOLIN HFA 90 MCG INHALER: 108 (90 BAS | 25 days supply | Qty: 18 | Fill #1

## 2018-10-07 ENCOUNTER — Ambulatory Visit (INDEPENDENT_AMBULATORY_CARE_PROVIDER_SITE_OTHER): Payer: No Typology Code available for payment source | Admitting: Psychology

## 2018-10-07 DIAGNOSIS — F4310 Post-traumatic stress disorder, unspecified: Secondary | ICD-10-CM

## 2018-10-13 MED FILL — VENTOLIN HFA 90 MCG INHALER: 108 (90 BAS | 25 days supply | Qty: 18 | Fill #1

## 2018-10-27 ENCOUNTER — Other Ambulatory Visit (INDEPENDENT_AMBULATORY_CARE_PROVIDER_SITE_OTHER): Payer: No Typology Code available for payment source

## 2018-10-27 ENCOUNTER — Ambulatory Visit (INDEPENDENT_AMBULATORY_CARE_PROVIDER_SITE_OTHER): Payer: No Typology Code available for payment source | Admitting: Internal Medicine

## 2018-10-27 ENCOUNTER — Encounter: Payer: Self-pay | Admitting: Internal Medicine

## 2018-10-27 VITALS — BP 116/74 | HR 82 | Temp 98.1°F | Ht 63.0 in | Wt 232.0 lb

## 2018-10-27 DIAGNOSIS — E559 Vitamin D deficiency, unspecified: Secondary | ICD-10-CM

## 2018-10-27 DIAGNOSIS — Z0000 Encounter for general adult medical examination without abnormal findings: Secondary | ICD-10-CM | POA: Diagnosis not present

## 2018-10-27 DIAGNOSIS — E538 Deficiency of other specified B group vitamins: Secondary | ICD-10-CM | POA: Diagnosis not present

## 2018-10-27 DIAGNOSIS — D509 Iron deficiency anemia, unspecified: Secondary | ICD-10-CM

## 2018-10-27 DIAGNOSIS — J4520 Mild intermittent asthma, uncomplicated: Secondary | ICD-10-CM

## 2018-10-27 LAB — URINALYSIS
Bilirubin Urine: NEGATIVE
Ketones, ur: NEGATIVE
Leukocytes,Ua: NEGATIVE
NITRITE: NEGATIVE
PH: 7 (ref 5.0–8.0)
SPECIFIC GRAVITY, URINE: 1.01 (ref 1.000–1.030)
TOTAL PROTEIN, URINE-UPE24: NEGATIVE
Urine Glucose: NEGATIVE
Urobilinogen, UA: 0.2 (ref 0.0–1.0)

## 2018-10-27 LAB — LIPID PANEL
CHOLESTEROL: 171 mg/dL (ref 0–200)
HDL: 44.7 mg/dL (ref >39.00)
LDL Cholesterol: 104 mg/dL — ABNORMAL HIGH (ref 0–99)
NonHDL: 126.62
Total CHOL/HDL Ratio: 4
Triglycerides: 111 mg/dL (ref 0.0–149.0)
VLDL: 22.2 mg/dL (ref 0.0–40.0)

## 2018-10-27 LAB — BASIC METABOLIC PANEL
BUN: 18 mg/dL (ref 6–23)
CO2: 27 mEq/L (ref 19–32)
Calcium: 9.1 mg/dL (ref 8.4–10.5)
Chloride: 103 mEq/L (ref 96–112)
Creatinine, Ser: 0.82 mg/dL (ref 0.40–1.20)
GFR: 95.07 mL/min (ref >60.00)
Glucose, Bld: 90 mg/dL (ref 70–99)
Potassium: 4.3 mEq/L (ref 3.5–5.1)
SODIUM: 138 meq/L (ref 135–145)

## 2018-10-27 LAB — HEPATIC FUNCTION PANEL
ALBUMIN: 4.1 g/dL (ref 3.5–5.2)
ALK PHOS: 55 U/L (ref 39–117)
ALT: 14 U/L (ref 0–35)
AST: 16 U/L (ref 0–37)
Bilirubin, Direct: 0.1 mg/dL (ref 0.0–0.3)
Total Bilirubin: 0.3 mg/dL (ref 0.2–1.2)
Total Protein: 7.5 g/dL (ref 6.0–8.3)

## 2018-10-27 LAB — TSH: TSH: 1.56 u[IU]/mL (ref 0.35–4.50)

## 2018-10-27 LAB — VITAMIN D 25 HYDROXY (VIT D DEFICIENCY, FRACTURES): VITD: 27.92 ng/mL — ABNORMAL LOW (ref 30.00–100.00)

## 2018-10-27 LAB — VITAMIN B12: Vitamin B-12: 433 pg/mL (ref 211–911)

## 2018-10-27 NOTE — Progress Notes (Signed)
Subjective:  Patient ID: Deanna Schwartz, female    DOB: 06-Mar-1981  Age: 38 y.o. MRN: [id number]  CC: No chief complaint on file.   HPI [facility] presents for a well exam C/o being sleepy - denies OSA  Outpatient Medications Prior to Visit  Medication Sig Dispense Refill  . Cholecalciferol (VITAMIN D3) 2000 units capsule Take 1 capsule (2,000 Units total) by mouth daily. 100 capsule 3  . Cyanocobalamin (VITAMIN B-12) 1000 MCG SUBL Place 1 tablet (1,000 mcg total) under the tongue daily. 100 tablet 3  . esomeprazole (NEXIUM) 40 MG capsule Take 1 capsule (40 mg total) by mouth daily. 30 capsule 3  . Magnesium 400 MG CAPS     . Multiple Vitamin (MULTI-VITAMIN DAILY PO) Take 1 tablet by mouth daily.     . Omega-3 Fatty Acids (OMEGA-3 PO)     . TURMERIC PO Take by mouth.    . VENTOLIN HFA 108 (90 Base) MCG/ACT inhaler INHALE 1 TO 2 PUFFS BY MOUTH INTO THE LUNGS EVERY 6 HOURS AS NEEDED FOR WHEEZING OR SHORTNESS OF BREATH (Patient taking differently: Inhale 1-2 puffs into the lungs every 6 (six) hours as needed for shortness of breath. ) 18 g 5  . beclomethasone (QVAR) 40 MCG/ACT inhaler Inhale 2 puffs into the lungs 2 (two) times daily. 1 Inhaler 12  . Vilazodone HCl (VIIBRYD) 20 MG TABS Take 1 tablet (20 mg total) by mouth daily. 30 tablet 6   No facility-administered medications prior to visit.     ROS: Review of Systems  Constitutional: Positive for fatigue and unexpected weight change. Negative for activity change, appetite change and chills.  HENT: Negative for congestion, mouth sores and sinus pressure.   Eyes: Negative for visual disturbance.  Respiratory: Negative for cough and chest tightness.   Gastrointestinal: Negative for abdominal pain and nausea.  Genitourinary: Negative for difficulty urinating, frequency and vaginal pain.  Musculoskeletal: Negative for back pain and gait problem.  Skin: Negative for pallor and rash.  Neurological: Negative for dizziness,  tremors, weakness, numbness and headaches.  Psychiatric/Behavioral: Negative for confusion, sleep disturbance and suicidal ideas. The patient is not nervous/anxious.     Objective:  BP 116/74 (BP Location: Left Arm, Patient Position: Sitting, Cuff Size: Large)   Pulse 82   Temp 98.1 F (36.7 C) (Oral)   Ht 5\' 3"  (1.6 m)   Wt 232 lb (105.2 kg)   SpO2 97%   BMI 41.10 kg/m   BP Readings from Last 3 Encounters:  10/27/18 116/74  07/28/18 117/62  07/27/18 110/74    Wt Readings from Last 3 Encounters:  10/27/18 232 lb (105.2 kg)  07/28/18 230 lb 12.8 oz (104.7 kg)  07/27/18 228 lb (103.4 kg)    Physical Exam Constitutional:      General: She is not in acute distress.    Appearance: She is well-developed.  HENT:     Head: Normocephalic.     Right Ear: External ear normal.     Left Ear: External ear normal.     Nose: Nose normal.  Eyes:     General:        Right eye: No discharge.        Left eye: No discharge.     Conjunctiva/sclera: Conjunctivae normal.     Pupils: Pupils are equal, round, and reactive to light.  Neck:     Musculoskeletal: Normal range of motion and neck supple.     Thyroid: No thyromegaly.  Vascular: No JVD.     Trachea: No tracheal deviation.  Cardiovascular:     Rate and Rhythm: Normal rate and regular rhythm.     Heart sounds: Normal heart sounds.  Pulmonary:     Effort: No respiratory distress.     Breath sounds: No stridor. No wheezing.  Abdominal:     General: Bowel sounds are normal. There is no distension.     Palpations: Abdomen is soft. There is no mass.     Tenderness: There is no abdominal tenderness. There is no guarding or rebound.  Musculoskeletal:        General: No tenderness.  Lymphadenopathy:     Cervical: No cervical adenopathy.  Skin:    Findings: No erythema or rash.  Neurological:     Cranial Nerves: No cranial nerve deficit.     Motor: No abnormal muscle tone.     Coordination: Coordination normal.     Deep  Tendon Reflexes: Reflexes normal.  Psychiatric:        Behavior: Behavior normal.        Thought Content: Thought content normal.        Judgment: Judgment normal.   Obese  Lab Results  Component Value Date   WBC 12.6 (H) 07/26/2018   HGB 13.5 07/26/2018   HCT 40.5 07/26/2018   PLT 287.0 07/26/2018   GLUCOSE 82 07/26/2018   CHOL 158 10/13/2017   TRIG 215.0 (H) 10/13/2017   HDL 49.70 10/13/2017   LDLDIRECT 86.0 10/13/2017   LDLCALC 106 (H) 08/26/2016   ALT 23 07/26/2018   AST 19 07/26/2018   NA 138 07/26/2018   K 4.4 07/26/2018   CL 101 07/26/2018   CREATININE 0.82 07/26/2018   BUN 17 07/26/2018   CO2 28 07/26/2018   TSH 2.42 10/13/2017   HGBA1C 5.7 12/16/2016    Dg Chest 2 View  Result Date: 07/19/2018 CLINICAL DATA:  Shortness of breath. EXAM: CHEST - 2 VIEW COMPARISON:  July 02, 2018 FINDINGS: The heart size and mediastinal contours are within normal limits. Both lungs are clear. The visualized skeletal structures are unremarkable. IMPRESSION: No active cardiopulmonary disease. Electronically Signed   By: Dorise Bullion III M.D   On: 07/19/2018 22:31   Ct Angio Chest Pe W/cm &/or Wo Cm  Result Date: 07/20/2018 CLINICAL DATA:  Acute onset of shortness of breath. Upper chest discomfort. EXAM: CT ANGIOGRAPHY CHEST WITH CONTRAST TECHNIQUE: Multidetector CT imaging of the chest was performed using the standard protocol during bolus administration of intravenous contrast. Multiplanar CT image reconstructions and MIPs were obtained to evaluate the vascular anatomy. CONTRAST:  185mL ISOVUE-370 IOPAMIDOL (ISOVUE-370) INJECTION 76% COMPARISON:  Chest radiograph performed 07/19/2018 FINDINGS: Cardiovascular:  There is no evidence of pulmonary embolus. The heart is normal in size. The thoracic aorta is unremarkable. The great vessels are within normal limits. Mediastinum/Nodes: Mediastinum is unremarkable in appearance. No mediastinal lymphadenopathy is seen. No pericardial effusion  is identified. The visualized portions of the thyroid gland are unremarkable. No axillary lymphadenopathy is seen. Lungs/Pleura: The lungs are clear bilaterally. No focal consolidation, pleural effusion or pneumothorax is seen. No masses are identified. Upper Abdomen: The visualized portions of the liver and spleen are unremarkable. The visualized portions of the pancreas, adrenal glands and kidneys are within normal limits. Musculoskeletal: No acute osseous abnormalities are identified. The visualized musculature is unremarkable in appearance. Review of the MIP images confirms the above findings. IMPRESSION: No evidence of pulmonary embolus. Lungs clear bilaterally. Electronically Signed  By: Garald Balding M.D.   On: 07/20/2018 04:56    Assessment & Plan:   There are no diagnoses linked to this encounter.   No orders of the defined types were placed in this encounter.    Follow-up: No follow-ups on file.  [name], MD

## 2018-10-27 NOTE — Assessment & Plan Note (Signed)
We discussed age appropriate health related issues, including available/recomended screening tests and vaccinations. We discussed a need for adhering to healthy diet and exercise. Labs were ordered to be later reviewed . All questions were answered. Refused Pneumovax Labs

## 2018-10-27 NOTE — Assessment & Plan Note (Signed)
CBC

## 2018-10-27 NOTE — Assessment & Plan Note (Signed)
Refused Pneumovax

## 2018-10-27 NOTE — Assessment & Plan Note (Signed)
Labs

## 2018-10-27 NOTE — Assessment & Plan Note (Signed)
Vit D 

## 2018-10-28 ENCOUNTER — Other Ambulatory Visit: Payer: Self-pay

## 2018-10-28 ENCOUNTER — Ambulatory Visit: Payer: Self-pay | Admitting: Oncology

## 2018-10-29 ENCOUNTER — Other Ambulatory Visit: Payer: Self-pay | Admitting: Oncology

## 2018-10-29 ENCOUNTER — Inpatient Hospital Stay: Payer: No Typology Code available for payment source | Admitting: Oncology

## 2018-10-29 ENCOUNTER — Inpatient Hospital Stay: Payer: No Typology Code available for payment source

## 2018-11-03 ENCOUNTER — Telehealth: Payer: Self-pay | Admitting: Internal Medicine

## 2018-11-03 NOTE — Telephone Encounter (Signed)
Please advise 

## 2018-11-03 NOTE — Telephone Encounter (Signed)
Patient came into the office requesting inhaler samples. Please advise.  Contact patient when ready for pick up [phone]).

## 2018-11-04 NOTE — Telephone Encounter (Signed)
Can you call pt and inform her of this?

## 2018-11-04 NOTE — Telephone Encounter (Signed)
Sorry we don't have ventolin thx

## 2018-11-04 NOTE — Telephone Encounter (Signed)
Called patient and informed

## 2018-11-05 ENCOUNTER — Ambulatory Visit: Payer: Self-pay | Admitting: Family

## 2018-11-05 ENCOUNTER — Ambulatory Visit (INDEPENDENT_AMBULATORY_CARE_PROVIDER_SITE_OTHER): Payer: No Typology Code available for payment source | Admitting: Family

## 2018-11-05 ENCOUNTER — Encounter: Payer: Self-pay | Admitting: Family

## 2018-11-05 VITALS — BP 124/78 | HR 110 | Temp 98.0°F | Ht 63.0 in | Wt 235.0 lb

## 2018-11-05 DIAGNOSIS — R6889 Other general symptoms and signs: Secondary | ICD-10-CM | POA: Diagnosis not present

## 2018-11-05 LAB — POC INFLUENZA A&B (BINAX/QUICKVUE)
INFLUENZA A, POC: NEGATIVE
Influenza B, POC: NEGATIVE

## 2018-11-05 MED ORDER — CETIRIZINE HCL 10 MG PO TABS: 10.0000 mg | ORAL_TABLET | Freq: Every day | ORAL | 3 refills | Status: DC

## 2018-11-05 MED ORDER — BENZONATATE 100 MG PO CAPS: 100.0000 mg | ORAL_CAPSULE | Freq: Three times a day (TID) | ORAL | 0 refills | Status: DC | PRN

## 2018-11-05 MED ORDER — LORATADINE 10 MG PO TABS: 10.0000 mg | ORAL_TABLET | Freq: Every day | ORAL | 3 refills | Status: DC

## 2018-11-05 MED ORDER — OSELTAMIVIR PHOSPHATE 75 MG PO CAPS: 75.0000 mg | ORAL_CAPSULE | Freq: Two times a day (BID) | ORAL | 0 refills | Status: DC

## 2018-11-05 MED ORDER — ALBUTEROL SULFATE HFA 108 (90 BASE) MCG/ACT IN AERS: INHALATION_SPRAY | RESPIRATORY_TRACT | 5 refills | Status: DC

## 2018-11-05 MED FILL — VENTOLIN HFA 90 MCG INHALER: 108 (90 BAS | 25 days supply | Qty: 18 | Fill #0

## 2018-11-05 MED FILL — BENZONATATE 100 MG CAP: 100 | 6 days supply | Qty: 20 | Fill #0

## 2018-11-05 MED FILL — OSELTAMIVIR PHOSPHATE 75 MG: 75 | 5 days supply | Qty: 10 | Fill #0

## 2018-11-05 NOTE — Progress Notes (Signed)
Deanna Schwartz is a 38 y.o. female with the following history as recorded in [employer]:  Patient Active Problem List   Diagnosis Date Noted  . Nonallopathic lesion of cervical region 06/14/2018  . Sternoclavicular joint pain, right 06/14/2018  . Trapezius muscle spasm 05/22/2017  . Knee MCL sprain 11/13/2016  . Depression 11/10/2016  . Class 2 obesity without serious comorbidity with body mass index (BMI) of 39.0 to 39.9 in adult 10/14/2016  . Nonallopathic lesion of sacral region 10/10/2016  . Panic attack 08/15/2016  . Coccydynia 06/10/2016  . Vasovagal syncope 03/14/2016  . RUQ pain 03/14/2016  . Vitamin D deficiency 11/22/2015  . 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 Nonallopathic lesion of thoracic region 02/21/2015  . Nonallopathic lesion of lumbosacral region 02/21/2015  . Nonallopathic lesion-rib cage 02/21/2015  . Piriformis syndrome of left side 02/21/2015  . Cervicalgia 01/22/2015  . Obesity (BMI 35.0-39.9 without comorbidity) 01/22/2015  . Muscle tension headache 01/22/2015  . Iron deficiency anemia due to chronic blood loss - menses 01/03/2015  . Chronic chest wall pain - mostly left side 01/03/2015  . Well adult exam 11/13/2014  . B12 deficiency 11/13/2014  . Acromioclavicular joint separation, type 1 07/12/2014  . Trochanteric bursitis of left hip 06/20/2014  . Patellofemoral pain syndrome 04/07/2014  . Acute medial meniscal tear 01/25/2014  . Weight gain 11/18/2013  . Anemia, iron deficiency 11/18/2013  . Abdominal pain, left upper quadrant-musculoskeletal 05/13/2013  . Iron deficiency anemia 08/06/2011  . Arthralgia 03/24/2011  . INSOMNIA, CHRONIC 10/11/2010  . IBS 04/07/2008  . Palpitations 01/11/2008  . Generalized anxiety disorder 11/25/2007  . Allergic rhinitis 11/25/2007  . GERD 11/25/2007  . SINUSITIS, CHRONIC 04/03/2007  . Asthma 04/03/2007    Current Outpatient Medications  Medication Sig Dispense Refill  . albuterol (VENTOLIN HFA) 108 (90  Base) MCG/ACT inhaler INHALE 1 TO 2 PUFFS BY MOUTH INTO THE LUNGS EVERY 6 HOURS AS NEEDED FOR WHEEZING OR SHORTNESS OF BREATH 18 g 5  . Cholecalciferol (VITAMIN D3) 2000 units capsule Take 1 capsule (2,000 Units total) by mouth daily. 100 capsule 3  . Cyanocobalamin (VITAMIN B-12) 1000 MCG SUBL Place 1 tablet (1,000 mcg total) under the tongue daily. 100 tablet 3  . esomeprazole (NEXIUM) 40 MG capsule Take 1 capsule (40 mg total) by mouth daily. 30 capsule 3  . Magnesium 400 MG CAPS     . Multiple Vitamin (MULTI-VITAMIN DAILY PO) Take 1 tablet by mouth daily.     . Omega-3 Fatty Acids (OMEGA-3 PO)     . TURMERIC PO Take by mouth.    . benzonatate (TESSALON) 100 MG capsule Take 1 capsule (100 mg total) by mouth 3 (three) times daily as needed. 20 capsule 0  . cetirizine (ZYRTEC) 10 MG tablet Take 1 tablet (10 mg total) by mouth daily. 90 tablet 3  . loratadine (CLARITIN) 10 MG tablet Take 1 tablet (10 mg total) by mouth daily. 90 tablet 3  . oseltamivir (TAMIFLU) 75 MG capsule Take 1 capsule (75 mg total) by mouth 2 (two) times daily. 10 capsule 0   No current facility-administered medications for this visit.     Allergies: Augmentin [amoxicillin-pot clavulanate]; Cephalexin; and Iodine  Past Medical History:  Diagnosis Date  . Allergic rhinitis   . Anemia   . Anxiety   . Asthma   . 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 Chronic constipation   . Constipation   . Depression   .  GERD (gastroesophageal reflux disease)   . H. pylori infection 2008   Hx of tx + serology  . Iron deficiency anemia 08/06/2011   Hgb 10.2 and MCV 79 on health screen labs   . Irritable bowel syndrome   . Obesity   . Palpitations   . Panic attack   . Shortness of breath   . Ulcer     Past Surgical History:  Procedure Laterality Date  . FLEXIBLE SIGMOIDOSCOPY  04/14/2008   normal  . NASAL SINUS SURGERY    . UPPER GASTROINTESTINAL ENDOSCOPY      Family History   Problem Relation Age of Onset  . Diabetes Unknown        grandmother  . Hypertension Unknown   . Hypertension Mother   . Anxiety disorder Mother   . Obesity Mother   . Hypertension Father   . Diabetes Maternal Aunt   . Diabetes Maternal Aunt   . Diabetes Paternal Grandmother   . Colon polyps Paternal Uncle        x4  . Colon cancer Neg Hx     Social History   Tobacco Use  . Smoking status: Never Smoker  . Smokeless tobacco: Never Used  Substance Use Topics  . Alcohol use: Yes    Alcohol/week: 0.0 standard drinks    Comment: Social maybe twice amonth, beer    Subjective:  Patient presents with flu- like symptoms; started suddenly this am; + body aches, chills, sore throat, headache; notes that co-workers are sick with similar symptoms; symptoms seemed to start as soon as she got to work this morning; does have underlying mild asthma- has already started to use her rescue inhaler;  Did take a flu shot this year;   LMP 10/09/2018  Objective:  Vitals:   11/05/18 1102  BP: 124/78  Pulse: (!) 110  Temp: 98 F (36.7 C)  TempSrc: Oral  SpO2: 97%  Weight: 235 lb 0.6 oz (106.6 kg)  Height: 5\' 3"  (1.6 m)    General: Well developed, well nourished, in no acute distress  Skin : Warm and dry.  Head: Normocephalic and atraumatic  Eyes: Sclera and conjunctiva clear; pupils round and reactive to light; extraocular movements intact  Ears: External normal; canals clear; tympanic membranes normal  Oropharynx: Pink, supple. No suspicious lesions  Neck: Supple without thyromegaly, adenopathy  Lungs: Respirations unlabored; clear to auscultation bilaterally without wheeze, rales, rhonchi  CVS exam: normal rate and regular rhythm.  Neurologic: Alert and oriented; speech intact; face symmetrical; moves all extremities well; CNII-XII intact without focal deficit   Assessment:  1. Flu-like symptoms     Plan:  Rapid flu was negative but symptoms and presentation are concerning; will  treat with Tamiflu 75 mg bid x 5 days, Tessalon perles tid prn; increase fluids, rest and follow-up worse, no better.  No follow-ups on file.  No orders of the defined types were placed in this encounter.   Requested Prescriptions   Signed Prescriptions Disp Refills  . oseltamivir (TAMIFLU) 75 MG capsule 10 capsule 0    Sig: Take 1 capsule (75 mg total) by mouth 2 (two) times daily.  . benzonatate (TESSALON) 100 MG capsule 20 capsule 0    Sig: Take 1 capsule (100 mg total) by mouth 3 (three) times daily as needed.  [name] albuterol (VENTOLIN HFA) 108 (90 Base) MCG/ACT inhaler 18 g 5    Sig: INHALE 1 TO 2 PUFFS BY MOUTH INTO THE LUNGS EVERY 6 HOURS AS NEEDED FOR  WHEEZING OR SHORTNESS OF BREATH  . loratadine (CLARITIN) 10 MG tablet 90 tablet 3    Sig: Take 1 tablet (10 mg total) by mouth daily.  . cetirizine (ZYRTEC) 10 MG tablet 90 tablet 3    Sig: Take 1 tablet (10 mg total) by mouth daily.

## 2018-11-05 NOTE — Addendum Note (Signed)
Addended by: Marcina Millard on: 11/05/2018 11:47 AM   Modules accepted: Orders

## 2018-11-10 ENCOUNTER — Ambulatory Visit: Payer: No Typology Code available for payment source | Admitting: Psychology

## 2018-11-29 MED ORDER — BECLOMETHASONE DIPROPIONATE 40 MCG/ACT IN AERS: 2.0000 | INHALATION_SPRAY | Freq: Two times a day (BID) | RESPIRATORY_TRACT | 12 refills | Status: DC

## 2018-11-30 MED FILL — QVAR REDIHALER 40 MCG/ACT A: 40 | 30 days supply | Qty: 11 | Fill #0

## 2018-12-07 ENCOUNTER — Ambulatory Visit: Payer: Self-pay | Admitting: Oncology

## 2018-12-07 ENCOUNTER — Inpatient Hospital Stay: Payer: No Typology Code available for payment source

## 2019-01-20 MED ORDER — TRIAMCINOLONE ACETONIDE 0.1 % EX CREA: 1.0000 "application " | TOPICAL_CREAM | Freq: Two times a day (BID) | CUTANEOUS | 0 refills | Status: DC

## 2019-02-22 MED FILL — TRIAMCINOLONE 0.1% CREAM: 0.1 | 15 days supply | Qty: 30 | Fill #0

## 2019-02-22 MED FILL — ESOMEPRAZOLE MAG DR 40 MG C: 40 | 30 days supply | Qty: 30 | Fill #1

## 2019-03-07 ENCOUNTER — Telehealth: Payer: Self-pay

## 2019-03-07 NOTE — Telephone Encounter (Deleted)
Attempted to call patient to go over prescreening questions

## 2019-03-07 NOTE — Telephone Encounter (Signed)
Called patient and went over prescreening questions and informed her of our restrictions

## 2019-03-08 ENCOUNTER — Inpatient Hospital Stay: Payer: No Typology Code available for payment source | Attending: Oncology

## 2019-03-08 ENCOUNTER — Inpatient Hospital Stay (HOSPITAL_BASED_OUTPATIENT_CLINIC_OR_DEPARTMENT_OTHER): Payer: No Typology Code available for payment source | Admitting: Oncology

## 2019-03-08 ENCOUNTER — Other Ambulatory Visit: Payer: Self-pay

## 2019-03-08 VITALS — BP 102/60 | HR 86 | Temp 98.2°F | Resp 18 | Ht 63.0 in | Wt 223.2 lb

## 2019-03-08 DIAGNOSIS — N920 Excessive and frequent menstruation with regular cycle: Secondary | ICD-10-CM | POA: Insufficient documentation

## 2019-03-08 DIAGNOSIS — D509 Iron deficiency anemia, unspecified: Secondary | ICD-10-CM

## 2019-03-08 DIAGNOSIS — Z79899 Other long term (current) drug therapy: Secondary | ICD-10-CM | POA: Insufficient documentation

## 2019-03-08 DIAGNOSIS — D500 Iron deficiency anemia secondary to blood loss (chronic): Secondary | ICD-10-CM | POA: Insufficient documentation

## 2019-03-08 LAB — CBC WITH DIFFERENTIAL/PLATELET
Abs Immature Granulocytes: 0.02 10*3/uL (ref 0.00–0.07)
Basophils Absolute: 0.1 10*3/uL (ref 0.0–0.1)
Basophils Relative: 1 %
Eosinophils Absolute: 0.6 10*3/uL — ABNORMAL HIGH (ref 0.0–0.5)
Eosinophils Relative: 8 %
HCT: 38.4 % (ref 36.0–46.0)
Hemoglobin: 12 g/dL (ref 12.0–15.0)
Immature Granulocytes: 0 %
Lymphocytes Relative: 28 %
Lymphs Abs: 2.3 10*3/uL (ref 0.7–4.0)
MCH: 27.4 pg (ref 26.0–34.0)
MCHC: 31.3 g/dL (ref 30.0–36.0)
MCV: 87.7 fL (ref 80.0–100.0)
Monocytes Absolute: 0.5 10*3/uL (ref 0.1–1.0)
Monocytes Relative: 6 %
Neutro Abs: 4.8 10*3/uL (ref 1.7–7.7)
Neutrophils Relative %: 57 %
Platelets: 269 10*3/uL (ref 150–400)
RBC: 4.38 MIL/uL (ref 3.87–5.11)
RDW: 12.6 % (ref 11.5–15.5)
WBC: 8.4 10*3/uL (ref 4.0–10.5)
nRBC: 0 % (ref 0.0–0.2)

## 2019-03-08 NOTE — Progress Notes (Signed)
Hematology and Oncology Follow Up Visit  Deanna Schwartz [id number] 11-01-1980 38 y.o. 03/08/2019 3:54 PM Plotnikov, Evie Lacks, MDPlotnikov, Evie Lacks, MD   Principle Diagnosis: 38 year old woman with iron deficiency anemia due to menorrhagia noted in 2019.  [name]   Prior Therapy: Status post Feraheme infusion completed on Jan 06, 2018 for a total of 1020 mg.  Current therapy: Oral iron therapy using daily multivitamin.  Interim History: [name] reports no major changes in her health.  She denies excessive fatigue, tiredness or dyspnea on exertion.  She denies any craving ice or recent respiratory complaints.  She denies any hematochezia or melena.  She does report heavy menstrual cycles.  Continues to work full-time without any decline ability to do so.  She denied any alteration mental status, neuropathy, confusion or dizziness.  Denies any headaches or lethargy.  Denies any night sweats, weight loss or changes in appetite.  Denied orthopnea, dyspnea on exertion or chest discomfort.  Denies shortness of breath, difficulty breathing hemoptysis or cough.  Denies any abdominal distention, nausea, early satiety or dyspepsia.  Denies any hematuria, frequency, dysuria or nocturia.  Denies any skin irritation, dryness or rash.  Denies any ecchymosis or petechiae.  Denies any lymphadenopathy or clotting.  Denies any heat or cold intolerance.  Denies any anxiety or depression.  Remaining review of system is negative.         Medications: I have reviewed the patient's current medications.  Current Outpatient Medications  Medication Sig Dispense Refill  . albuterol (VENTOLIN HFA) 108 (90 Base) MCG/ACT inhaler INHALE 1 TO 2 PUFFS BY MOUTH INTO THE LUNGS EVERY 6 HOURS AS NEEDED FOR WHEEZING OR SHORTNESS OF BREATH 18 g 5  . beclomethasone (QVAR) 40 MCG/ACT inhaler Inhale 2 puffs into the lungs 2 (two) times daily. 1 Inhaler 12  . benzonatate (TESSALON) 100 MG capsule Take 1 capsule (100 mg total) by  mouth 3 (three) times daily as needed. 20 capsule 0  . cetirizine (ZYRTEC) 10 MG tablet Take 1 tablet (10 mg total) by mouth daily. 90 tablet 3  . Cholecalciferol (VITAMIN D3) 2000 units capsule Take 1 capsule (2,000 Units total) by mouth daily. 100 capsule 3  . Cyanocobalamin (VITAMIN B-12) 1000 MCG SUBL Place 1 tablet (1,000 mcg total) under the tongue daily. 100 tablet 3  . esomeprazole (NEXIUM) 40 MG capsule Take 1 capsule (40 mg total) by mouth daily. 30 capsule 3  . loratadine (CLARITIN) 10 MG tablet Take 1 tablet (10 mg total) by mouth daily. 90 tablet 3  . Magnesium 400 MG CAPS     . Multiple Vitamin (MULTI-VITAMIN DAILY PO) Take 1 tablet by mouth daily.     . Omega-3 Fatty Acids (OMEGA-3 PO)     . oseltamivir (TAMIFLU) 75 MG capsule Take 1 capsule (75 mg total) by mouth 2 (two) times daily. 10 capsule 0  . triamcinolone cream (KENALOG) 0.1 % Apply 1 application topically 2 (two) times daily. 30 g 0  . TURMERIC PO Take by mouth.     No current facility-administered medications for this visit.      Allergies:  Allergies  Allergen Reactions  . Augmentin [Amoxicillin-Pot Clavulanate] Rash  . Cephalexin Rash  . Iodine Swelling and Rash    Throat swelling after eating shrimp    Past Medical History, Surgical history, Social history, and Family History were reviewed and updated.    Physical Exam: Blood pressure 102/60, pulse 86, temperature 98.2 F (36.8 C), temperature source Oral, resp. rate 18,  height 5\' 3"  (1.6 m), weight 223 lb 3.2 oz (101.2 kg), SpO2 99 %. ECOG: 0   General appearance: Comfortable appearing without any discomfort Head: Normocephalic without any trauma Oropharynx: Mucous membranes are moist and pink without any thrush or ulcers. Eyes: Pupils are equal and round reactive to light. Lymph nodes: No cervical, supraclavicular, inguinal or axillary lymphadenopathy.   Heart:regular rate and rhythm.  S1 and S2 without leg edema. Lung: Clear without any  rhonchi or wheezes.  No dullness to percussion. Abdomin: Soft, nontender, nondistended with good bowel sounds.  No hepatosplenomegaly. Musculoskeletal: No joint deformity or effusion.  Full range of motion noted. Neurological: No deficits noted on motor, sensory and deep tendon reflex exam. Skin: No petechial rash or dryness.  Appeared moist.      Lab Results: Lab Results  Component Value Date   WBC 12.6 (H) 07/26/2018   HGB 13.5 07/26/2018   HCT 40.5 07/26/2018   MCV 88.9 07/26/2018   PLT 287.0 07/26/2018     Chemistry      Component Value Date/Time   NA 138 10/27/2018 0847   NA 141 08/26/2016 1145   K 4.3 10/27/2018 0847   CL 103 10/27/2018 0847   CO2 27 10/27/2018 0847   BUN 18 10/27/2018 0847   BUN 13 08/26/2016 1145   CREATININE 0.82 10/27/2018 0847   CREATININE 0.81 12/28/2017 1114   CREATININE 0.74 05/13/2013 1024      Component Value Date/Time   CALCIUM 9.1 10/27/2018 0847   ALKPHOS 55 10/27/2018 0847   AST 16 10/27/2018 0847   AST 25 12/28/2017 1114   ALT 14 10/27/2018 0847   ALT 21 12/28/2017 1114   BILITOT 0.3 10/27/2018 0847   BILITOT 0.3 12/28/2017 1114       Impression and Plan:   38 year old woman with the following:  1.  Iron deficiency anemia diagnosed in 2019 but has been noted even previous to that.  She is status post intravenous iron in August 2019 with normalization of her hemoglobin and iron studies.   She continues to be on oral iron replacement without any recent complaints.  Risks and benefits of repeating intravenous iron in the future as needed was discussed.  These complications occluding arthralgias, myalgias and infusion related complications.  Her hemoglobin is normal at this time but her iron studies are currently pending.  Will defer IV iron at this time unless her iron studies showed severe deficiency.  2.  Heavy menstrual cycles: No changes reported at this time.  She understands long as this is recurring she will require  repeat intravenous iron as needed.  3.  Follow-up: In 6 months for repeat evaluation.  15  minutes was spent with the patient face-to-face today.  More than 50% of time was spent on reviewing her disease status, reviewing laboratory data discussing future plan of care.    Zola Button, MD 6/30/20203:54 PM

## 2019-03-09 ENCOUNTER — Telehealth: Payer: Self-pay | Admitting: Oncology

## 2019-03-09 LAB — IRON AND TIBC
Iron: 35 ug/dL — ABNORMAL LOW (ref 41–142)
Saturation Ratios: 10 % — ABNORMAL LOW (ref 21–57)
TIBC: 370 ug/dL (ref 236–444)
UIBC: 335 ug/dL (ref 120–384)

## 2019-03-09 LAB — FERRITIN: Ferritin: 16 ng/mL (ref 11–307)

## 2019-03-09 NOTE — Telephone Encounter (Signed)
Scheduled per los. Mailed printout  °

## 2019-03-16 ENCOUNTER — Ambulatory Visit (INDEPENDENT_AMBULATORY_CARE_PROVIDER_SITE_OTHER): Payer: No Typology Code available for payment source | Admitting: Internal Medicine

## 2019-03-16 ENCOUNTER — Encounter: Payer: Self-pay | Admitting: Internal Medicine

## 2019-03-16 DIAGNOSIS — J0191 Acute recurrent sinusitis, unspecified: Secondary | ICD-10-CM | POA: Diagnosis not present

## 2019-03-16 MED ORDER — FLUCONAZOLE 150 MG PO TABS: 150.0000 mg | ORAL_TABLET | Freq: Once | ORAL | 1 refills | Status: AC

## 2019-03-16 MED ORDER — AZITHROMYCIN 250 MG PO TABS: ORAL_TABLET | ORAL | 0 refills | Status: DC

## 2019-03-16 MED FILL — FLUCONAZOLE 150 MG TABS: 150 | 1 days supply | Qty: 1 | Fill #0

## 2019-03-16 MED FILL — AZITHROMYCIN 250 MG TABS: 250 | 5 days supply | Qty: 6 | Fill #0

## 2019-03-16 NOTE — Assessment & Plan Note (Signed)
Z-Pak.  Diflucan if yeast infection.  Flonase or Afrin

## 2019-03-16 NOTE — Progress Notes (Signed)
Virtual Visit via Video Note  I connected with Deanna Schwartz on 03/16/19 at  3:00 PM EDT by a video enabled telemedicine application and verified that I am speaking with the correct person using two identifiers.   I discussed the limitations of evaluation and management by telemedicine and the availability of in person appointments. The patient expressed understanding and agreed to proceed.  History of Present Illness: C/o sinusitis sx's x 4 days.  There is a head congestion, pain behind her eyes, green nasal discharge.  She has the past and it feels like one.  No fever  There has been no cough, chest pain, shortness of breath, abdominal pain, diarrhea, constipation, arthralgias, skin rashes.   Observations/Objective: The patient appears to be in no acute distress, looks well.  Assessment and Plan:  See my Assessment and Plan. Follow Up Instructions:    I discussed the assessment and treatment plan with the patient. The patient was provided an opportunity to ask questions and all were answered. The patient agreed with the plan and demonstrated an understanding of the instructions.   The patient was advised to call back or seek an in-person evaluation if the symptoms worsen or if the condition fails to improve as anticipated.  I provided face-to-face time during this encounter. We were at different locations.   [name], MD

## 2019-03-28 ENCOUNTER — Other Ambulatory Visit: Payer: Self-pay | Admitting: *Deleted

## 2019-03-28 DIAGNOSIS — Z20822 Contact with and (suspected) exposure to covid-19: Secondary | ICD-10-CM

## 2019-03-28 NOTE — Addendum Note (Signed)
Addended by: Brigitte Pulse on: 03/28/2019 09:04 PM   Modules accepted: Orders

## 2019-03-29 MED FILL — QVAR REDIHALER 40 MCG/ACT A: 40 | 30 days supply | Qty: 11 | Fill #1

## 2019-03-30 LAB — NOVEL CORONAVIRUS, NAA: SARS-CoV-2, NAA: DETECTED — AB

## 2019-04-07 ENCOUNTER — Other Ambulatory Visit: Payer: Self-pay | Admitting: Internal Medicine

## 2019-04-11 ENCOUNTER — Other Ambulatory Visit: Payer: Self-pay

## 2019-04-11 DIAGNOSIS — Z20822 Contact with and (suspected) exposure to covid-19: Secondary | ICD-10-CM

## 2019-04-12 LAB — NOVEL CORONAVIRUS, NAA: SARS-CoV-2, NAA: NOT DETECTED

## 2019-04-21 ENCOUNTER — Other Ambulatory Visit: Payer: Self-pay

## 2019-04-21 ENCOUNTER — Telehealth: Payer: Self-pay | Admitting: *Deleted

## 2019-04-21 ENCOUNTER — Ambulatory Visit: Payer: No Typology Code available for payment source | Admitting: Family Medicine

## 2019-04-21 ENCOUNTER — Telehealth: Payer: Self-pay

## 2019-04-21 VITALS — BP 146/89 | Ht 63.0 in | Wt 215.0 lb

## 2019-04-21 DIAGNOSIS — S060X0A Concussion without loss of consciousness, initial encounter: Secondary | ICD-10-CM | POA: Insufficient documentation

## 2019-04-21 DIAGNOSIS — M549 Dorsalgia, unspecified: Secondary | ICD-10-CM | POA: Insufficient documentation

## 2019-04-21 DIAGNOSIS — M545 Low back pain, unspecified: Secondary | ICD-10-CM

## 2019-04-21 MED ORDER — CYCLOBENZAPRINE HCL 10 MG PO TABS: 10.0000 mg | ORAL_TABLET | Freq: Two times a day (BID) | ORAL | 0 refills | Status: DC | PRN

## 2019-04-21 MED FILL — CYCLOBENZAPRINE HCL 10 MG T: 10 | 15 days supply | Qty: 30 | Fill #0

## 2019-04-21 NOTE — Telephone Encounter (Signed)
Spoke with patient. She tripped on steps on 04/15/2019 and hit her head and back. No LOC. No history of head injury. Denies any headaches but does have pain over area of impact. Is working from home. Photophobia with screen use causing eye pain but no headaches. Also having pain over thoracic spine. Is using IBU throughout the day for pain. Patient would like to be seen this week. Recommended that she call Dr. Raeford Razor office to schedule appointment with Dr. Tamala Julian being out. Patient voices understanding.

## 2019-04-21 NOTE — Telephone Encounter (Signed)
Copied from [place] [id number]. Topic: Referral - Request for Referral >> Apr 21, 2019  9:33 AM Rainey Pines A wrote: Has patient seen PCP for this complaint? Yes *If NO, is insurance requiring patient see PCP for this issue before PCP can refer them? Referral for which specialty: Sports Medicine Preferred provider/office:Dr. Raeford Razor Reason for referral: Back pain and headaches

## 2019-04-21 NOTE — Assessment & Plan Note (Signed)
Seems to be more of a muscle spasm.  Less likely for fracture. -Flexeril. -Counseled on home exercise therapy and supportive care. -Could consider trigger point injections or imaging if no improvement.

## 2019-04-21 NOTE — Assessment & Plan Note (Signed)
Symptoms seem most suggestive of concussion.  Likely headache predominant. -Referral to physical therapy for vestibular ocular rehab. -Counseled on management in terms of light exercise, eating well and staying hydrated. -Follow-up in 2 weeks.

## 2019-04-21 NOTE — Patient Instructions (Signed)
Nice to meet you Please take a break if you have a recurrence of your symptoms when using electronics. Please avoid sitting in place for longer than 45 minutes. Please try heat on the back. Please try the exercises. You will get a call about physical therapy Please send me a message in MyChart with any questions or updates.  Please see me back in 2 weeks.   --Dr. Raeford Razor

## 2019-04-21 NOTE — Progress Notes (Signed)
Deanna Schwartz - 38 y.o. female MRN [id number]  Date of birth: [birth year]  SUBJECTIVE:  Including CC & ROS.  No chief complaint on file.   Deanna Schwartz is a 38 y.o. female that is presenting with frontal headache and mid thoracic back pain.  She fell and hit the corner of a wall with her back in the posterior aspect of her head about 1 week ago.  Since that time she has had ongoing frontal headache.  This is intermittent in nature.  She has some exacerbation of the headache when using electronics.  Has some difficulty concentrating and focusing.  Has been obtaining good sleep.  She feels that her anxiety is at baseline.  Does not get headaches or migraines on a regular basis.  Has been taking ibuprofen with improvement of her symptoms.  She is currently working from home.  She reports hitting the mid back on the corner of the wall when she fell.  She has been having this right-sided ongoing back pain.  She is received a massage with limited improvement.  The pain is localized to this region.  She has stiffness and soreness.  This is especially evident in the morning.  She also has some stiffness in the shoulders from this.  Denies any similar history.  Seems to be staying the same.  Localized to this region.  No radicular symptoms.  Pain is moderate to severe.   Review of Systems  Constitutional: Negative for fever.  HENT: Negative for congestion.   Respiratory: Negative for cough.   Cardiovascular: Negative for chest pain.  Gastrointestinal: Negative for abdominal pain.  Musculoskeletal: Positive for back pain.  Skin: Negative for color change.  Neurological: Positive for headaches.  Hematological: Negative for adenopathy.    HISTORY: Past Medical, Surgical, Social, and Family History Reviewed & Updated per EMR.   Pertinent Historical Findings include:  Past Medical History:  Diagnosis Date  . Allergic rhinitis   . Anemia   . Anxiety   . Asthma   . 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 Chronic constipation   . Constipation   . Depression   . GERD (gastroesophageal reflux disease)   . H. pylori infection 2008   Hx of tx + serology  . Iron deficiency anemia 08/06/2011   Hgb 10.2 and MCV 79 on health screen labs   . Irritable bowel syndrome   . Obesity   . Palpitations   . Panic attack   . Shortness of breath   . Ulcer     Past Surgical History:  Procedure Laterality Date  . FLEXIBLE SIGMOIDOSCOPY  04/14/2008   normal  . NASAL SINUS SURGERY    . UPPER GASTROINTESTINAL ENDOSCOPY      Allergies  Allergen Reactions  . Augmentin [Amoxicillin-Pot Clavulanate] Rash  . Cephalexin Rash  . Iodine Swelling and Rash    Throat swelling after eating shrimp    Family History  Problem Relation Age of Onset  . Diabetes Unknown        grandmother  . Hypertension Unknown   . Hypertension Mother   . Anxiety disorder Mother   . Obesity Mother   . Hypertension Father   . Diabetes Maternal Aunt   . Diabetes Maternal Aunt   . Diabetes Paternal Grandmother   . Colon polyps Paternal Uncle        x4  . Colon cancer Neg Hx      Social History  Socioeconomic History  . Marital status: Married    Spouse name: Not on file  . Number of children: 3  . Years of education: Not on file  . Highest education level: Not on file  Occupational History  . Occupation: Financial controller [employer]    Employer: [employer]  Social Needs  . Financial resource strain: Not on file  . Food insecurity    Worry: Not on file    Inability: Not on file  . Transportation needs    Medical: Not on file    Non-medical: Not on file  Tobacco Use  . Smoking status: Never Smoker  . Smokeless tobacco: Never Used  Substance and Sexual Activity  . Alcohol use: Yes    Alcohol/week: 0.0 standard drinks    Comment: Social maybe twice amonth, beer  . Drug use: No  . Sexual activity: Yes    Birth control/protection: None  Lifestyle  . Physical  activity    Days per week: Not on file    Minutes per session: Not on file  . Stress: Not on file  Relationships  . Social Herbalist on phone: Not on file    Gets together: Not on file    Attends religious service: Not on file    Active member of club or organization: Not on file    Attends meetings of clubs or organizations: Not on file    Relationship status: Not on file  . Intimate partner violence    Fear of current or ex partner: Not on file    Emotionally abused: Not on file    Physically abused: Not on file    Forced sexual activity: Not on file  Other Topics Concern  . Not on file  Social History Narrative   HSG, [employer] college in [place]. Occupation: [employer]. married  in Oct 2009. Son born in 2009, [place] dtrs - '07, '11. Marriage in good health.     PHYSICAL EXAM:  VS: BP (!) 146/89   Ht 5\' 3"  (1.6 m)   Wt 215 lb (97.5 kg)   BMI 38.09 kg/m  Physical Exam Gen: NAD, alert, cooperative with exam, well-appearing ENT: normal lips, normal nasal mucosa,  Eye: normal EOM, normal conjunctiva and lids CV:  no edema, +2 pedal pulses   Resp: no accessory muscle use, non-labored,   Skin: no rashes, no areas of induration  Neuro: normal tone, normal sensation to touch, cranial nerves II through XII intact, exacerbation of symptoms with horizontal gaze.  No exacerbation of symptoms with vertical gaze.  Positive saccades testing Psych:  normal insight, alert and oriented MSK:  Back: Tenderness to palpation over the lower thoracic paraspinal muscles on the right side. No significant tenderness palpation over the thoracic midline spine. No winging of the scapula. Normal shoulder range of motion. Neurovascular intact     ASSESSMENT & PLAN:   Concussion with no loss of consciousness Symptoms seem most suggestive of concussion.  Likely headache predominant. -Referral to physical therapy for vestibular ocular rehab. -Counseled on management in  terms of light exercise, eating well and staying hydrated. -Follow-up in 2 weeks.  Mid back pain Seems to be more of a muscle spasm.  Less likely for fracture. -Flexeril. -Counseled on home exercise therapy and supportive care. -Could consider trigger point injections or imaging if no improvement.

## 2019-04-23 ENCOUNTER — Other Ambulatory Visit: Payer: Self-pay

## 2019-04-23 DIAGNOSIS — Z20822 Contact with and (suspected) exposure to covid-19: Secondary | ICD-10-CM

## 2019-04-24 LAB — NOVEL CORONAVIRUS, NAA: SARS-CoV-2, NAA: NOT DETECTED

## 2019-04-24 NOTE — Telephone Encounter (Signed)
Okay. Thank you.

## 2019-04-25 ENCOUNTER — Ambulatory Visit: Payer: No Typology Code available for payment source | Admitting: Physical Therapy

## 2019-05-04 ENCOUNTER — Encounter: Payer: Self-pay | Admitting: Physical Therapy

## 2019-05-04 ENCOUNTER — Ambulatory Visit: Payer: No Typology Code available for payment source | Attending: Family Medicine | Admitting: Physical Therapy

## 2019-05-04 ENCOUNTER — Other Ambulatory Visit: Payer: Self-pay

## 2019-05-04 DIAGNOSIS — M542 Cervicalgia: Secondary | ICD-10-CM | POA: Diagnosis present

## 2019-05-04 DIAGNOSIS — M546 Pain in thoracic spine: Secondary | ICD-10-CM | POA: Diagnosis present

## 2019-05-04 NOTE — Patient Instructions (Signed)
Access Code: [email or web address]  URL: [email or web address]  Date: 05/04/2019  Prepared by: Elsie Ra   Exercises  Seated Levator Scapulae Stretch - 3 sets - 30 hold - 2x daily - 6x weekly  Seated Cervical Sidebending Stretch - 3 sets - 30 hold - 2x daily - 6x weekly  Seated Cervical Retraction - 10 reps - 3 sets - 2x daily - 6x weekly  Sidelying Upper Thoracic Rotation - 10 reps - 3 sets - 2x daily - 6x weekly  Standing Shoulder Row with Anchored Resistance - 10 reps - 2-3 sets - 2x daily - 6x weekly  Shoulder Extension with Resistance - Neutral - 10 reps - 2-3 sets - 2x daily - 6x weekly  Supine Suboccipital Release with Tennis Balls - 1-3 sets - 1-2 min hold - 2x daily - 6x weekly

## 2019-05-05 ENCOUNTER — Ambulatory Visit: Payer: No Typology Code available for payment source | Admitting: Family Medicine

## 2019-05-05 NOTE — Therapy (Signed)
[facility] [address] [place], [place], [address] Phone: [phone]   Fax:  [phone]  Physical Therapy Evaluation  Patient Details  Name: Deanna Schwartz MRN: [id number] Date of Birth: 04/03/1981 Referring Provider (PT): Rosemarie Ax, MD   Encounter Date: 05/04/2019  PT End of Session - 05/05/19 0834    Visit Number  1    Number of Visits  4   may only be one time visit to trial HEP   Date for PT Re-Evaluation  06/02/19    Authorization Type  [facility]    PT Start Time  1700    PT Stop Time  1745    PT Time Calculation (min)  45 min    Activity Tolerance  Patient tolerated treatment well    Behavior During Therapy  [facility] for tasks assessed/performed       Past Medical History:  Diagnosis Date  . Allergic rhinitis   . Anemia   . Anxiety   . Asthma   . 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 Chronic constipation   . Constipation   . Depression   . GERD (gastroesophageal reflux disease)   . H. pylori infection 2008   Hx of tx + serology  . Iron deficiency anemia 08/06/2011   Hgb 10.2 and MCV 79 on health screen labs   . Irritable bowel syndrome   . Obesity   . Palpitations   . Panic attack   . Shortness of breath   . Ulcer     Past Surgical History:  Procedure Laterality Date  . FLEXIBLE SIGMOIDOSCOPY  04/14/2008   normal  . NASAL SINUS SURGERY    . UPPER GASTROINTESTINAL ENDOSCOPY      There were no vitals filed for this visit.   Subjective Assessment - 05/04/19 1718    Subjective  she reports Mid/upper back pain, concussion, presenting with frontal headache and mid thoracic back pain.  She fell at the stairs and hit the corner of a wall with her back in the posterior aspect of her head about 3 weeks ago.  Since that time she has had ongoing frontal headache.  This is intermittent in nature.  She has some exacerbation of the headache when using  electronics.  Has some sensitiveity to light and loud noises but denies having any difficulty with focus or concentration or balance.    Currently in Pain?  Yes    Pain Score  4     Pain Location  Back    Pain Orientation  Mid    Pain Descriptors / Indicators  Aching    Pain Type  Acute pain    Aggravating Factors   sitting too long    Pain Relieving Factors  advil, and "back stretcher for her chair"         [facility] PT Assessment - 05/05/19 0001      Assessment   Medical Diagnosis  Mid/upper back pain, concussion    Referring Provider (PT)  Rosemarie Ax, MD    Onset Date/Surgical Date  04/13/19   fall was around 3 weeks ago   Hand Dominance  Right    Next MD Visit  September 4th    Prior Therapy  none      Balance Screen   Has the patient fallen in the past 6 months  Yes    How many times?  2   fluke accidents and not  losing her balance   Has the patient had a decrease in activity level because of a fear of falling?   No    Is the patient reluctant to leave their home because of a fear of falling?   No      Home Film/video editor residence      Prior Function   Level of Independence  Independent    Vocation  Full time employment    Vocation Requirements  sitting work    Leisure  bike riding with kids and playing outside       [employer]   Overall Cognitive Status  Within Functional Limits for tasks assessed      Sensation   Light Touch  Appears Intact      Coordination   Gross Motor Movements are Fluid and Coordinated  Yes    Finger Nose Finger Test  WNL    Heel Shin Test  WNL      Posture/Postural Control   Posture Comments  increased Thoracic kyphosis      AROM   Overall AROM Comments  back and UE ROM WNL    Cervical Flexion  50%   pain and tightness reported   Cervical Extension  WNL    Cervical - Right Side Bend  75%    Cervical - Left Side Bend  75%    Cervical - Right Rotation  WNL    Cervical - Left Rotation  WNL       Strength   Overall Strength  Within functional limits for tasks performed    Overall Strength Comments  UE/LE strength WNL      Palpation   Palpation comment  TTP upper Thoracic and lower cervical on Lt side only      Special Tests   Other special tests  Neg spurlings test, neg SLR test, some relief with cervical distraction      Transfers   Transfers  Independent with all Transfers      Ambulation/Gait   Gait Comments  WNL      Balance   Balance Assessed  --   no signs of gait stability or decreased balance               Objective measurements completed on examination: See above findings.              PT Education - 05/04/19 1812    Education Details  HEP, POC, self care for S.O release and traction at home    Person(s) Educated  Patient    Methods  Explanation;Verbal cues;Handout;Demonstration    Comprehension  Verbalized understanding;Returned demonstration;Need further instruction          PT Long Term Goals - 05/05/19 0843      PT LONG TERM GOAL #1   Title  Pt will show good understanding of HEP and will be provided illustrated copy. Met today.    Status  Achieved      PT LONG TERM GOAL #2   Title  More goals will be written in the event she returns to PT             Plan - 05/05/19 0277    Clinical Impression Statement  Pt presents with upper back/cervical pain/strain. This appears to be more muscular in nature and she does not have radiculopathy and is only TTP at muscules and not in spine. She did appear to have post concussion symptoms initially but now just headache and  vestibular testing negative today so headaches may be more tension headaches in nature. She was educated about reducing screen time and sleep qualtiy. She wants to trial HEP at this time but was encouraged to come back for a few sesisons if she feels she needs to.    Examination-Activity Limitations  Sleep;Carry    Examination-Participation Restrictions   Driving;Laundry    Stability/Clinical Decision Making  Evolving/Moderate complexity    Clinical Decision Making  Moderate    Rehab Potential  Good    PT Frequency  1x / week    PT Duration  4 weeks    PT Treatment/Interventions  Cryotherapy;Electrical Stimulation;Iontophoresis '4mg'$ /ml Dexamethasone;Moist Heat;Traction;Ultrasound;Therapeutic activities;Therapeutic exercise;Neuromuscular re-education;Manual techniques;Joint Manipulations;Spinal Manipulations;Taping    PT Next Visit Plan  review HEP, consider gaze stabilization if needed, consider further balance testing but she reports not difficulty with balance and was steady with ambulation in eval.    PT Home Exercise Plan  Access Code: [email or web address]    Consulted and Agree with Plan of Care  Patient       Patient will benefit from skilled therapeutic intervention in order to improve the following deficits and impairments:  Decreased activity tolerance, Decreased range of motion, Postural dysfunction, Pain  Visit Diagnosis: Cervicalgia  Pain in thoracic spine     Problem List Patient Active Problem List   Diagnosis Date Noted  . Mid back pain 04/21/2019  . Concussion with no loss of consciousness 04/21/2019  . Nonallopathic lesion of cervical region 06/14/2018  . Sternoclavicular joint pain, right 06/14/2018  . Trapezius muscle spasm 05/22/2017  . Knee MCL sprain 11/13/2016  . Depression 11/10/2016  . Class 2 obesity without serious comorbidity with body mass index (BMI) of 39.0 to 39.9 in adult 10/14/2016  . Nonallopathic lesion of sacral region 10/10/2016  . Panic attack 08/15/2016  . Coccydynia 06/10/2016  . Vasovagal syncope 03/14/2016  . RUQ pain 03/14/2016  . Vitamin D deficiency 11/22/2015  . 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 Nonallopathic lesion of thoracic region 02/21/2015  . Nonallopathic lesion of lumbosacral region 02/21/2015  . Nonallopathic lesion-rib cage 02/21/2015  . Piriformis syndrome of left side  02/21/2015  . Cervicalgia 01/22/2015  . Obesity (BMI 35.0-39.9 without comorbidity) 01/22/2015  . Muscle tension headache 01/22/2015  . Iron deficiency anemia due to chronic blood loss - menses 01/03/2015  . Chronic chest wall pain - mostly left side 01/03/2015  . Well adult exam 11/13/2014  . B12 deficiency 11/13/2014  . Acromioclavicular joint separation, type 1 07/12/2014  . Trochanteric bursitis of left hip 06/20/2014  . Patellofemoral pain syndrome 04/07/2014  . Acute medial meniscal tear 01/25/2014  . Weight gain 11/18/2013  . Anemia, iron deficiency 11/18/2013  . Acute sinusitis 11/05/2013  . Abdominal pain, left upper quadrant-musculoskeletal 05/13/2013  . Iron deficiency anemia 08/06/2011  . Arthralgia 03/24/2011  . INSOMNIA, CHRONIC 10/11/2010  . IBS 04/07/2008  . Palpitations 01/11/2008  . Generalized anxiety disorder 11/25/2007  . Allergic rhinitis 11/25/2007  . GERD 11/25/2007  . SINUSITIS, CHRONIC 04/03/2007  . Asthma 04/03/2007    Silvestre Mesi 05/05/2019, 8:47 AM  [facility] [address] [place], [place], [address] Phone: [phone]   Fax:  [phone]  Name: Deanna Schwartz MRN: [id number] Date of Birth: 09/17/1980

## 2019-05-13 ENCOUNTER — Ambulatory Visit: Payer: No Typology Code available for payment source | Admitting: Family Medicine

## 2019-05-13 NOTE — Progress Notes (Deleted)
Deanna Schwartz - 38 y.o. female MRN [id number]  Date of birth: 14-Dec-1980  SUBJECTIVE:  Including CC & ROS.  No chief complaint on file.   Deanna Schwartz is a 38 y.o. female that is  ***.  ***   Review of Systems  HISTORY: Past Medical, Surgical, Social, and Family History Reviewed & Updated per EMR.   Pertinent Historical Findings include:  Past Medical History:  Diagnosis Date  . Allergic rhinitis   . Anemia   . Anxiety   . Asthma   . 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 Chronic constipation   . Constipation   . Depression   . GERD (gastroesophageal reflux disease)   . H. pylori infection 2008   Hx of tx + serology  . Iron deficiency anemia 08/06/2011   Hgb 10.2 and MCV 79 on health screen labs   . Irritable bowel syndrome   . Obesity   . Palpitations   . Panic attack   . Shortness of breath   . Ulcer     Past Surgical History:  Procedure Laterality Date  . FLEXIBLE SIGMOIDOSCOPY  04/14/2008   normal  . NASAL SINUS SURGERY    . UPPER GASTROINTESTINAL ENDOSCOPY      Allergies  Allergen Reactions  . Augmentin [Amoxicillin-Pot Clavulanate] Rash  . Cephalexin Rash  . Iodine Swelling and Rash    Throat swelling after eating shrimp    Family History  Problem Relation Age of Onset  . Diabetes Unknown        grandmother  . Hypertension Unknown   . Hypertension Mother   . Anxiety disorder Mother   . Obesity Mother   . Hypertension Father   . Diabetes Maternal Aunt   . Diabetes Maternal Aunt   . Diabetes Paternal Grandmother   . Colon polyps Paternal Uncle        x4  . Colon cancer Neg Hx      Social History   Socioeconomic History  . Marital status: Married    Spouse name: Not on file  . Number of children: 3  . Years of education: Not on file  . Highest education level: Not on file  Occupational History  . Occupation: Financial controller GI [employer]    Employer: [employer]  Social Needs  . Financial resource  strain: Not on file  . Food insecurity    Worry: Not on file    Inability: Not on file  . Transportation needs    Medical: Not on file    Non-medical: Not on file  Tobacco Use  . Smoking status: Never Smoker  . Smokeless tobacco: Never Used  Substance and Sexual Activity  . Alcohol use: Yes    Alcohol/week: 0.0 standard drinks    Comment: Social maybe twice amonth, beer  . Drug use: No  . Sexual activity: Yes    Birth control/protection: None  Lifestyle  . Physical activity    Days per week: Not on file    Minutes per session: Not on file  . Stress: Not on file  Relationships  . Social Herbalist on phone: Not on file    Gets together: Not on file    Attends religious service: Not on file    Active member of club or organization: Not on file    Attends meetings of clubs or organizations: Not on file    Relationship status: Not on file  . Intimate partner  violence    Fear of current or ex partner: Not on file    Emotionally abused: Not on file    Physically abused: Not on file    Forced sexual activity: Not on file  Other Topics Concern  . Not on file  Social History Narrative   HSG, [employer] in [place]. Occupation: [employer]. married  in Oct 2009. Son born in 2009, [place] dtrs - '07, '11. Marriage in good health.     PHYSICAL EXAM:  VS: There were no vitals taken for this visit. Physical Exam Gen: NAD, alert, cooperative with exam, well-appearing ENT: normal lips, normal nasal mucosa,  Eye: normal EOM, normal conjunctiva and lids CV:  no edema, +2 pedal pulses   Resp: no accessory muscle use, non-labored,  GI: no masses or tenderness, no hernia  Skin: no rashes, no areas of induration  Neuro: normal tone, normal sensation to touch Psych:  normal insight, alert and oriented MSK:  ***      ASSESSMENT & PLAN:   No problem-specific Assessment & Plan notes found for this encounter.

## 2019-05-18 ENCOUNTER — Other Ambulatory Visit: Payer: Self-pay

## 2019-05-18 DIAGNOSIS — Z20822 Contact with and (suspected) exposure to covid-19: Secondary | ICD-10-CM

## 2019-05-20 LAB — NOVEL CORONAVIRUS, NAA: SARS-CoV-2, NAA: NOT DETECTED

## 2019-06-29 ENCOUNTER — Ambulatory Visit (INDEPENDENT_AMBULATORY_CARE_PROVIDER_SITE_OTHER): Payer: No Typology Code available for payment source | Admitting: Internal Medicine

## 2019-06-29 ENCOUNTER — Encounter: Payer: Self-pay | Admitting: Internal Medicine

## 2019-06-29 ENCOUNTER — Ambulatory Visit: Payer: Self-pay

## 2019-06-29 ENCOUNTER — Other Ambulatory Visit: Payer: Self-pay

## 2019-06-29 DIAGNOSIS — M549 Dorsalgia, unspecified: Secondary | ICD-10-CM

## 2019-06-29 DIAGNOSIS — M5440 Lumbago with sciatica, unspecified side: Secondary | ICD-10-CM | POA: Insufficient documentation

## 2019-06-29 DIAGNOSIS — Z20822 Contact with and (suspected) exposure to covid-19: Secondary | ICD-10-CM

## 2019-06-29 DIAGNOSIS — R200 Anesthesia of skin: Secondary | ICD-10-CM | POA: Insufficient documentation

## 2019-06-29 MED ORDER — PREDNISONE 20 MG PO TABS: 40.0000 mg | ORAL_TABLET | Freq: Every day | ORAL | 0 refills | Status: DC

## 2019-06-29 NOTE — Progress Notes (Signed)
Virtual Visit via Video Note  I connected with Deanna Schwartz on 06/29/19 at  2:45 PM EDT by a video enabled telemedicine application and verified that I am speaking with the correct person using two identifiers.   I discussed the limitations of evaluation and management by telemedicine and the availability of in person appointments. The patient expressed understanding and agreed to proceed.  The patient is currently at home and I am in the office.    No referring provider.    History of Present Illness: This is an acute visit for back pain.     She has had middle back pain for a while.  It is across her middle back.  It feels like a muscle spasms she has had in the past, but she has tried taking flexeril and it has not helped and usually it works well for her muscle spasms.  The flexeril helps, but there is still a dull pain.  She denies injury for the back pain.  The pain is intermittent and can be intense at times.  Sitting makes the pain worse. She denies numbness/tingling.  She took advil and it helps.   She had lower back pain last night and she did have sciatica, which has improved.     Right wrist tingling/numbness at wrist when she woke up.   There is no pain or weakness in the hand or wrist.  She types a lot.  There is no swelling.   She has an appointment with Dr Tamala Julian in 6 days.     Social History   Socioeconomic History  . Marital status: Married    Spouse name: Not on file  . Number of children: 3  . Years of education: Not on file  . Highest education level: Not on file  Occupational History  . Occupation: Financial controller GI [employer]    Employer: [employer]  Social Needs  . Financial resource strain: Not on file  . Food insecurity    Worry: Not on file    Inability: Not on file  . Transportation needs    Medical: Not on file    Non-medical: Not on file  Tobacco Use  . Smoking status: Never Smoker  . Smokeless tobacco: Never Used  Substance and Sexual Activity   . Alcohol use: Yes    Alcohol/week: 0.0 standard drinks    Comment: Social maybe twice amonth, beer  . Drug use: No  . Sexual activity: Yes    Birth control/protection: None  Lifestyle  . Physical activity    Days per week: Not on file    Minutes per session: Not on file  . Stress: Not on file  Relationships  . Social Herbalist on phone: Not on file    Gets together: Not on file    Attends religious service: Not on file    Active member of club or organization: Not on file    Attends meetings of clubs or organizations: Not on file    Relationship status: Not on file  Other Topics Concern  . Not on file  Social History Narrative   HSG, [employer] college in [place]. Occupation: [employer]. married  in Oct 2009. Son born in 2009, [place] dtrs - '07, '11. Marriage in good health.     Observations/Objective: Appears well in NAD Breathing normally   Assessment and Plan:  See Problem List for Assessment and Plan of chronic medical problems.   Follow Up Instructions:  I discussed the assessment and treatment plan with the patient. The patient was provided an opportunity to ask questions and all were answered. The patient agreed with the plan and demonstrated an understanding of the instructions.   The patient was advised to call back or seek an in-person evaluation if the symptoms worsen or if the condition fails to improve as anticipated.    Binnie Rail, MD

## 2019-06-29 NOTE — Assessment & Plan Note (Signed)
States she is having some numbness at her wrist, but does not involve her hand.  No pain, weakness or swelling She does do a lot of typing No neck pain Possibly carpal tunnel She does see sports medicine in 6 days and can discuss with them since I am not able to evaluate the wrist Advised that she could try a wrist brace to see if that helps

## 2019-06-29 NOTE — Assessment & Plan Note (Signed)
Episode of lower back pain with sciatica This has improved Will be taking a course of prednisone for her mid back pain, which may also make sure this has completely resolved See sports medicine next week and can discuss this if she is still having symptoms

## 2019-06-29 NOTE — Assessment & Plan Note (Signed)
Mid back pain has been going on for a while She did have a fall in July and had mid back pain, but that pain did improve so this does not seem to be related Flexeril helping some, but still having a dull pain-can continue Will try short course of prednisone to see if that helps Has an appointment with sports medicine next week

## 2019-06-29 NOTE — Telephone Encounter (Signed)
Seeing you at 2:45

## 2019-06-29 NOTE — Telephone Encounter (Signed)
Pt. Reports she has had mid-low back pain x 2 weeks. Has tried pain patches and IBU and even bought a new bed. Developed right wrist and hand numbness 4 days ago that comes and goes.Works on [employer], "so I don't know if that has something to do with it." Warm transfer to Tammy in the practice for a visit. Answer Assessment - Initial Assessment Questions 1. ONSET: "When did the pain begin?"      Started 2 weeks 2. LOCATION: "Where does it hurt?" (upper, mid or lower back)     Mid-low 3. SEVERITY: "How bad is the pain?"  (e.g., Scale 1-10; mild, moderate, or severe)   - MILD (1-3): doesn't interfere with normal activities    - MODERATE (4-7): interferes with normal activities or awakens from sleep    - SEVERE (8-10): excruciating pain, unable to do any normal activities       6 4. PATTERN: "Is the pain constant?" (e.g., yes, no; constant, intermittent)      Constant the last few days 5. RADIATION: "Does the pain shoot into your legs or elsewhere?"     Right and hand involvement 6. CAUSE:  "What do you think is causing the back pain?"      Unsure 7. BACK OVERUSE:  "Any recent lifting of heavy objects, strenuous work or exercise?"     No 8. MEDICATIONS: "What have you taken so far for the pain?" (e.g., nothing, acetaminophen, NSAIDS)     Pain patches and IBU 9. NEUROLOGIC SYMPTOMS: "Do you have any weakness, numbness, or problems with bowel/bladder control?"     Numbness to right hand and wrist 10. OTHER SYMPTOMS: "Do you have any other symptoms?" (e.g., fever, abdominal pain, burning with urination, blood in urine)       No 11. PREGNANCY: "Is there any chance you are pregnant?" (e.g., yes, no; LMP)       No  Protocols used: BACK PAIN-A-AH

## 2019-07-01 LAB — NOVEL CORONAVIRUS, NAA: SARS-CoV-2, NAA: NOT DETECTED

## 2019-07-05 ENCOUNTER — Ambulatory Visit: Payer: No Typology Code available for payment source | Admitting: Family Medicine

## 2019-07-18 ENCOUNTER — Other Ambulatory Visit: Payer: Self-pay

## 2019-07-18 ENCOUNTER — Ambulatory Visit: Payer: No Typology Code available for payment source | Admitting: Family Medicine

## 2019-07-18 ENCOUNTER — Telehealth: Payer: Self-pay | Admitting: Internal Medicine

## 2019-07-18 ENCOUNTER — Ambulatory Visit: Payer: Self-pay

## 2019-07-18 ENCOUNTER — Encounter: Payer: Self-pay | Admitting: Family Medicine

## 2019-07-18 VITALS — BP 114/78 | HR 96 | Ht 63.0 in | Wt 215.0 lb

## 2019-07-18 DIAGNOSIS — M79604 Pain in right leg: Secondary | ICD-10-CM

## 2019-07-18 DIAGNOSIS — S76311D Strain of muscle, fascia and tendon of the posterior muscle group at thigh level, right thigh, subsequent encounter: Secondary | ICD-10-CM | POA: Insufficient documentation

## 2019-07-18 DIAGNOSIS — M25561 Pain in right knee: Secondary | ICD-10-CM

## 2019-07-18 DIAGNOSIS — S76311A Strain of muscle, fascia and tendon of the posterior muscle group at thigh level, right thigh, initial encounter: Secondary | ICD-10-CM

## 2019-07-18 DIAGNOSIS — G8929 Other chronic pain: Secondary | ICD-10-CM

## 2019-07-18 NOTE — Telephone Encounter (Signed)
Please advise 

## 2019-07-18 NOTE — Progress Notes (Signed)
Deanna Schwartz - 38 y.o. female MRN [id number]  Date of birth: October 21, 1980  SUBJECTIVE:  Including CC & ROS.  Chief Complaint  Patient presents with  . Knee Pain    right knee x 07/16/2019    Deanna Schwartz is a 38 y.o. female that is presenting with acute right hamstring pain.  The pain occurred a couple of days ago when she was out in the yard playing with the kids.  She feels the pain posteriorly.  She denies any ecchymosis or swelling.  She is having significant pain with ambulation.  She feels the pain may be associated with her knee since she has had pain with it in the past.  Has been taking ibuprofen with limited improvement.  Symptoms are worse with sitting down as well.    Review of Systems  Constitutional: Negative for fever.  HENT: Negative for congestion.   Respiratory: Negative for cough.   Cardiovascular: Negative for chest pain.  Gastrointestinal: Negative for abdominal pain.  Musculoskeletal: Positive for gait problem.  Skin: Negative for color change.  Neurological: Negative for weakness.  Hematological: Negative for adenopathy.    HISTORY: Past Medical, Surgical, Social, and Family History Reviewed & Updated per EMR.   Pertinent Historical Findings include:  Past Medical History:  Diagnosis Date  . Allergic rhinitis   . Anemia   . Anxiety   . Asthma   . 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 Chronic constipation   . Constipation   . Depression   . GERD (gastroesophageal reflux disease)   . H. pylori infection 2008   Hx of tx + serology  . Iron deficiency anemia 08/06/2011   Hgb 10.2 and MCV 79 on health screen labs   . Irritable bowel syndrome   . Obesity   . Palpitations   . Panic attack   . Shortness of breath   . Ulcer     Past Surgical History:  Procedure Laterality Date  . FLEXIBLE SIGMOIDOSCOPY  04/14/2008   normal  . NASAL SINUS SURGERY    . UPPER GASTROINTESTINAL ENDOSCOPY      Allergies   Allergen Reactions  . Augmentin [Amoxicillin-Pot Clavulanate] Rash  . Cephalexin Rash  . Iodine Swelling and Rash    Throat swelling after eating shrimp    Family History  Problem Relation Age of Onset  . Diabetes Unknown        grandmother  . Hypertension Unknown   . Hypertension Mother   . Anxiety disorder Mother   . Obesity Mother   . Hypertension Father   . Diabetes Maternal Aunt   . Diabetes Maternal Aunt   . Diabetes Paternal Grandmother   . Colon polyps Paternal Uncle        x4  . Colon cancer Neg Hx      Social History   Socioeconomic History  . Marital status: Married    Spouse name: Not on file  . Number of children: 3  . Years of education: Not on file  . Highest education level: Not on file  Occupational History  . Occupation: Financial controller [employer]    Employer: [employer]  Social Needs  . Financial resource strain: Not on file  . Food insecurity    Worry: Not on file    Inability: Not on file  . Transportation needs    Medical: Not on file    Non-medical: Not on file  Tobacco Use  . Smoking status:  Never Smoker  . Smokeless tobacco: Never Used  Substance and Sexual Activity  . Alcohol use: Yes    Alcohol/week: 0.0 standard drinks    Comment: Social maybe twice amonth, beer  . Drug use: No  . Sexual activity: Yes    Birth control/protection: None  Lifestyle  . Physical activity    Days per week: Not on file    Minutes per session: Not on file  . Stress: Not on file  Relationships  . Social Herbalist on phone: Not on file    Gets together: Not on file    Attends religious service: Not on file    Active member of club or organization: Not on file    Attends meetings of clubs or organizations: Not on file    Relationship status: Not on file  . Intimate partner violence    Fear of current or ex partner: Not on file    Emotionally abused: Not on file    Physically abused: Not on file    Forced sexual activity: Not on file  Other  Topics Concern  . Not on file  Social History Narrative   HSG, [employer] in [place]. Occupation: [employer]. married  in Oct 2009. Son born in 2009, [place] dtrs - '07, '11. Marriage in good health.     PHYSICAL EXAM:  VS: BP 114/78   Pulse 96   Ht 5\' 3"  (1.6 m)   Wt 215 lb (97.5 kg)   BMI 38.09 kg/m  Physical Exam Gen: NAD, alert, cooperative with exam, well-appearing ENT: normal lips, normal nasal mucosa,  Eye: normal EOM, normal conjunctiva and lids CV:  no edema, +2 pedal pulses   Resp: no accessory muscle use, non-labored,  Skin: no rashes, no areas of induration  Neuro: normal tone, normal sensation to touch Psych:  normal insight, alert and oriented MSK:  Right leg/knee: No obvious effusion. Limited extension with pain at maximum range of motion. Limited flexion to about 100 degrees. No tenderness to palpation over the medial or lateral joint line. No instability with valgus or varus stress testing. Tenderness to palpation of the hamstring Ambulating with limp. Neurovascularly intact  Limited ultrasound: Left leg/knee:  No effusion within the suprapatellar pouch. Normal appearing quadricep and patellar tendon. Mild degenerative changes in the medial joint line.  Normal-appearing insertion of the Achilles. Normal mid belly of the Achilles. No changes observed in the medial or lateral gastrocnemius.  The biceps femoris mid belly appears to have hypoechoic changes consistent with a strain.  There is loss of homogeneity of the mid belly of the muscle.  Summary: Findings would suggest a hamstring strain  Ultrasound and interpretation by Clearance Coots, MD    ASSESSMENT & PLAN:   Hamstring strain, right, initial encounter Findings would suggest a hamstring strain on ultrasound.  The knee looks structurally intact no changes appreciated within the gastrocnemius. -Provided Duexis samples. -Counseled and provided compression. -Provided crutches.  -Counseled on home exercise therapy and supportive care. -Can follow-up in 4 weeks.  Could consider physical therapy.

## 2019-07-18 NOTE — Patient Instructions (Signed)
Good to see you Please alternate heat and ice  Please try compression.  Please the exercises once the pain has improved  You can take the duexis as needed Please send me a message in MyChart with any questions or updates.  Please see me back in  4 weeks or sooner if needed.   --Dr. Raeford Razor

## 2019-07-18 NOTE — Telephone Encounter (Signed)
Patient has already an appointment scheduled seeking a referral for right knee pain

## 2019-07-18 NOTE — Assessment & Plan Note (Signed)
Findings would suggest a hamstring strain on ultrasound.  The knee looks structurally intact no changes appreciated within the gastrocnemius. -Provided Duexis samples. -Counseled and provided compression. -Provided crutches. -Counseled on home exercise therapy and supportive care. -Can follow-up in 4 weeks.  Could consider physical therapy.

## 2019-07-19 NOTE — Telephone Encounter (Signed)
Ok thx.

## 2019-07-21 MED ORDER — ALBUTEROL SULFATE HFA 108 (90 BASE) MCG/ACT IN AERS: INHALATION_SPRAY | RESPIRATORY_TRACT | 5 refills | Status: DC

## 2019-07-21 MED ORDER — QVAR 40 MCG/ACT IN AERS: 2.0000 | INHALATION_SPRAY | Freq: Two times a day (BID) | RESPIRATORY_TRACT | 12 refills | Status: DC

## 2019-07-22 ENCOUNTER — Telehealth: Payer: Self-pay | Admitting: *Deleted

## 2019-07-22 MED ORDER — QVAR REDIHALER 40 MCG/ACT IN AERB: 2.0000 | INHALATION_SPRAY | Freq: Two times a day (BID) | RESPIRATORY_TRACT | 11 refills | Status: DC

## 2019-07-22 NOTE — Telephone Encounter (Signed)
Called pharmacy to give verbal ok to change. On hold too long. New Rx sent in. Med list updated.

## 2019-07-22 NOTE — Telephone Encounter (Signed)
Copied from [place] [id number]. Topic: General - Other >> Jul 22, 2019 10:31 AM Carolyn Stare wrote: Pharmacy call to say they no longer get beclomethasone (QVAR) 40 MCG/ACT inhaler and ask if they can switch to the below med and if so they need a new RX  Qvar redihaler   [employer]

## 2019-07-22 NOTE — Addendum Note (Signed)
Addended by: Cresenciano Lick on: 07/22/2019 04:08 PM   Modules accepted: Orders

## 2019-07-22 NOTE — Telephone Encounter (Signed)
Yes, that is fine. 

## 2019-07-22 NOTE — Telephone Encounter (Signed)
Please advise in PCP's absence. Thanks! 

## 2019-07-26 MED FILL — QVAR REDIHALER 40 MCG/ACT A: 40 | 30 days supply | Qty: 11 | Fill #0

## 2019-07-26 MED FILL — ALBUTEROL SULFATE HFA 108 (: 108 (90 BAS | 25 days supply | Qty: 18 | Fill #0

## 2019-08-02 ENCOUNTER — Other Ambulatory Visit: Payer: Self-pay

## 2019-08-02 DIAGNOSIS — Z20822 Contact with and (suspected) exposure to covid-19: Secondary | ICD-10-CM

## 2019-08-04 LAB — NOVEL CORONAVIRUS, NAA: SARS-CoV-2, NAA: NOT DETECTED

## 2019-08-15 ENCOUNTER — Encounter: Payer: Self-pay | Admitting: Family Medicine

## 2019-08-15 ENCOUNTER — Ambulatory Visit (INDEPENDENT_AMBULATORY_CARE_PROVIDER_SITE_OTHER): Payer: No Typology Code available for payment source | Admitting: Internal Medicine

## 2019-08-15 ENCOUNTER — Other Ambulatory Visit: Payer: Self-pay

## 2019-08-15 ENCOUNTER — Ambulatory Visit: Payer: No Typology Code available for payment source | Admitting: Family Medicine

## 2019-08-15 VITALS — BP 125/80 | HR 76 | Ht 63.0 in | Wt 215.0 lb

## 2019-08-15 DIAGNOSIS — R42 Dizziness and giddiness: Secondary | ICD-10-CM | POA: Diagnosis not present

## 2019-08-15 DIAGNOSIS — R6883 Chills (without fever): Secondary | ICD-10-CM | POA: Diagnosis not present

## 2019-08-15 DIAGNOSIS — J0191 Acute recurrent sinusitis, unspecified: Secondary | ICD-10-CM | POA: Diagnosis not present

## 2019-08-15 DIAGNOSIS — S76311D Strain of muscle, fascia and tendon of the posterior muscle group at thigh level, right thigh, subsequent encounter: Secondary | ICD-10-CM | POA: Diagnosis not present

## 2019-08-15 MED ORDER — MECLIZINE HCL 12.5 MG PO TABS: 12.5000 mg | ORAL_TABLET | Freq: Three times a day (TID) | ORAL | 1 refills | Status: DC | PRN

## 2019-08-15 MED ORDER — CYCLOBENZAPRINE HCL 5 MG PO TABS: 5.0000 mg | ORAL_TABLET | Freq: Two times a day (BID) | ORAL | 0 refills | Status: DC | PRN

## 2019-08-15 MED ORDER — AZITHROMYCIN 250 MG PO TABS: ORAL_TABLET | ORAL | 0 refills | Status: DC

## 2019-08-15 MED FILL — MECLIZINE 25 MG TABLET: 25 | 10 days supply | Qty: 15 | Fill #0

## 2019-08-15 MED FILL — AZITHROMYCIN 250 MG TABLET: 250 | 5 days supply | Qty: 6 | Fill #0

## 2019-08-15 MED FILL — CYCLOBENZAPRINE HCL 5 MG TA: 5 | 15 days supply | Qty: 30 | Fill #0

## 2019-08-15 NOTE — Progress Notes (Signed)
Virtual Visit via Video Note  I connected with Deanna Schwartz on 08/27/19 at  3:00 PM EST by a video enabled telemedicine application and verified that I am speaking with the correct person using two identifiers.   I discussed the limitations of evaluation and management by telemedicine and the availability of in person appointments. The patient expressed understanding and agreed to proceed.  History of Present Illness:  C/o vertigo off and on C/o occ HA, mild to moderate - like w/a sinus infection C/o chills x days There has been no  cough, chest pain, shortness of breath, abdominal pain, diarrhea, constipation, arthralgias, skin rashes.   Observations/Objective: The patient appears to be in no acute distress, looks well.  Assessment and Plan:  See my Assessment and Plan. Follow Up Instructions:    I discussed the assessment and treatment plan with the patient. The patient was provided an opportunity to ask questions and all were answered. The patient agreed with the plan and demonstrated an understanding of the instructions.   The patient was advised to call back or seek an in-person evaluation if the symptoms worsen or if the condition fails to improve as anticipated.  I provided face-to-face time during this encounter. We were at different locations.   [name], MD

## 2019-08-15 NOTE — Assessment & Plan Note (Signed)
Pain is still ongoing.  She most notices it with flexion or sitting on that affected side.  No palpable defects. Has good range of motion of the knee and strength. -Counseled on home exercise therapy and supportive care. -Flexeril. -Referral to physical therapy. -Could consider injection

## 2019-08-15 NOTE — Patient Instructions (Signed)
Good to see you Please try the flexeril at night initially. It can make you drowsy.  Physical therapy will give you a call.   Please send me a message in MyChart with any questions or updates.  Please see me back in 4-6 weeks.   --Dr. Raeford Razor

## 2019-08-15 NOTE — Progress Notes (Signed)
Deanna Schwartz - 38 y.o. female MRN [id number]  Date of birth: 1980-09-30  SUBJECTIVE:  Including CC & ROS.  Chief Complaint  Patient presents with  . Follow-up    follow up for right knee    Deanna Schwartz is a 38 y.o. female that is following up for her right hamstring pain.  She feels the pain most when she is sitting down.  She has intermittent cramping and pain over the lateral aspect of the hamstring.  She also feels it down at the posterior lateral knee.  She denies any injuries in the interim.  Pain can be crampy.  Seems to be worse when she is transitioning from sitting to standing.  Also worse when she is bending the knee.  It can catch from time to time.  She denies any knee complaints in terms of movement.    Review of Systems  Constitutional: Negative for fever.  HENT: Negative for congestion.   Respiratory: Negative for cough.   Cardiovascular: Negative for chest pain.  Gastrointestinal: Negative for abdominal pain.  Musculoskeletal: Negative for back pain.  Skin: Negative for color change.  Neurological: Negative for weakness.  Hematological: Negative for adenopathy.    HISTORY: Past Medical, Surgical, Social, and Family History Reviewed & Updated per EMR.   Pertinent Historical Findings include:  Past Medical History:  Diagnosis Date  . Allergic rhinitis   . Anemia   . Anxiety   . Asthma   . 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 Chronic constipation   . Constipation   . Depression   . GERD (gastroesophageal reflux disease)   . H. pylori infection 2008   Hx of tx + serology  . Iron deficiency anemia 08/06/2011   Hgb 10.2 and MCV 79 on health screen labs   . Irritable bowel syndrome   . Obesity   . Palpitations   . Panic attack   . Shortness of breath   . Ulcer     Past Surgical History:  Procedure Laterality Date  . FLEXIBLE SIGMOIDOSCOPY  04/14/2008   normal  . NASAL SINUS SURGERY    . UPPER  GASTROINTESTINAL ENDOSCOPY      Allergies  Allergen Reactions  . Augmentin [Amoxicillin-Pot Clavulanate] Rash  . Cephalexin Rash  . Iodine Swelling and Rash    Throat swelling after eating shrimp    Family History  Problem Relation Age of Onset  . Diabetes Unknown        grandmother  . Hypertension Unknown   . Hypertension Mother   . Anxiety disorder Mother   . Obesity Mother   . Hypertension Father   . Diabetes Maternal Aunt   . Diabetes Maternal Aunt   . Diabetes Paternal Grandmother   . Colon polyps Paternal Uncle        x4  . Colon cancer Neg Hx      Social History   Socioeconomic History  . Marital status: Married    Spouse name: Not on file  . Number of children: 3  . Years of education: Not on file  . Highest education level: Not on file  Occupational History  . Occupation: Financial controller GI [employer]    Employer: [employer]  Social Needs  . Financial resource strain: Not on file  . Food insecurity    Worry: Not on file    Inability: Not on file  . Transportation needs    Medical: Not on file  Non-medical: Not on file  Tobacco Use  . Smoking status: Never Smoker  . Smokeless tobacco: Never Used  Substance and Sexual Activity  . Alcohol use: Yes    Alcohol/week: 0.0 standard drinks    Comment: Social maybe twice amonth, beer  . Drug use: No  . Sexual activity: Yes    Birth control/protection: None  Lifestyle  . Physical activity    Days per week: Not on file    Minutes per session: Not on file  . Stress: Not on file  Relationships  . Social Herbalist on phone: Not on file    Gets together: Not on file    Attends religious service: Not on file    Active member of club or organization: Not on file    Attends meetings of clubs or organizations: Not on file    Relationship status: Not on file  . Intimate partner violence    Fear of current or ex partner: Not on file    Emotionally abused: Not on file    Physically abused: Not on file     Forced sexual activity: Not on file  Other Topics Concern  . Not on file  Social History Narrative   HSG, [employer] college in [place]. Occupation: [employer]. married  in Oct 2009. Son born in 2009, [place] dtrs - '07, '11. Marriage in good health.     PHYSICAL EXAM:  VS: BP 125/80   Pulse 76   Ht 5\' 3"  (1.6 m)   Wt 215 lb (97.5 kg)   BMI 38.09 kg/m  Physical Exam Gen: NAD, alert, cooperative with exam, well-appearing ENT: normal lips, normal nasal mucosa,  Eye: normal EOM, normal conjunctiva and lids CV:  no edema, +2 pedal pulses   Resp: no accessory muscle use, non-labored,   Skin: no rashes, no areas of induration  Neuro: normal tone, normal sensation to touch Psych:  normal insight, alert and oriented MSK:  Right hamstring: Some tenderness to palpation over the biceps femoris  No knee effusion. Tenderness to palpation at the insertion of the hamstring. Pain with resistance to flexion. Neurovascular intact     ASSESSMENT & PLAN:   Hamstring strain, right, subsequent encounter Pain is still ongoing.  She most notices it with flexion or sitting on that affected side.  No palpable defects. Has good range of motion of the knee and strength. -Counseled on home exercise therapy and supportive care. -Flexeril. -Referral to physical therapy. -Could consider injection

## 2019-08-17 ENCOUNTER — Other Ambulatory Visit (INDEPENDENT_AMBULATORY_CARE_PROVIDER_SITE_OTHER): Payer: No Typology Code available for payment source

## 2019-08-17 ENCOUNTER — Other Ambulatory Visit: Payer: Self-pay

## 2019-08-17 DIAGNOSIS — R42 Dizziness and giddiness: Secondary | ICD-10-CM | POA: Diagnosis not present

## 2019-08-17 DIAGNOSIS — R6883 Chills (without fever): Secondary | ICD-10-CM

## 2019-08-17 DIAGNOSIS — Z20822 Contact with and (suspected) exposure to covid-19: Secondary | ICD-10-CM

## 2019-08-17 LAB — URINALYSIS, ROUTINE W REFLEX MICROSCOPIC
Bilirubin Urine: NEGATIVE
Ketones, ur: NEGATIVE
Nitrite: NEGATIVE
Specific Gravity, Urine: 1.015 (ref 1.000–1.030)
Total Protein, Urine: 30 — AB
Urine Glucose: NEGATIVE
Urobilinogen, UA: 0.2 (ref 0.0–1.0)
pH: 8.5 — AB (ref 5.0–8.0)

## 2019-08-17 LAB — CBC WITH DIFFERENTIAL/PLATELET
Basophils Absolute: 0 10*3/uL (ref 0.0–0.1)
Basophils Relative: 0.6 % (ref 0.0–3.0)
Eosinophils Absolute: 0.6 10*3/uL (ref 0.0–0.7)
Eosinophils Relative: 8.3 % — ABNORMAL HIGH (ref 0.0–5.0)
HCT: 37.3 % (ref 36.0–46.0)
Hemoglobin: 11.9 g/dL — ABNORMAL LOW (ref 12.0–15.0)
Lymphocytes Relative: 22.6 % (ref 12.0–46.0)
Lymphs Abs: 1.5 10*3/uL (ref 0.7–4.0)
MCHC: 32 g/dL (ref 30.0–36.0)
MCV: 80.5 fl (ref 78.0–100.0)
Monocytes Absolute: 0.4 10*3/uL (ref 0.1–1.0)
Monocytes Relative: 6.4 % (ref 3.0–12.0)
Neutro Abs: 4.2 10*3/uL (ref 1.4–7.7)
Neutrophils Relative %: 62.1 % (ref 43.0–77.0)
Platelets: 254 10*3/uL (ref 150.0–400.0)
RBC: 4.63 Mil/uL (ref 3.87–5.11)
RDW: 15.2 % (ref 11.5–15.5)
WBC: 6.7 10*3/uL (ref 4.0–10.5)

## 2019-08-17 LAB — BASIC METABOLIC PANEL
BUN: 16 mg/dL (ref 6–23)
CO2: 30 mEq/L (ref 19–32)
Calcium: 9.4 mg/dL (ref 8.4–10.5)
Chloride: 101 mEq/L (ref 96–112)
Creatinine, Ser: 0.81 mg/dL (ref 0.40–1.20)
GFR: 96.01 mL/min (ref >60.00)
Glucose, Bld: 95 mg/dL (ref 70–99)
Potassium: 4 mEq/L (ref 3.5–5.1)
Sodium: 136 mEq/L (ref 135–145)

## 2019-08-17 LAB — TSH: TSH: 0.97 u[IU]/mL (ref 0.35–4.50)

## 2019-08-19 LAB — NOVEL CORONAVIRUS, NAA: SARS-CoV-2, NAA: NOT DETECTED

## 2019-08-23 ENCOUNTER — Encounter: Payer: Self-pay | Admitting: Family Medicine

## 2019-08-24 ENCOUNTER — Other Ambulatory Visit: Payer: Self-pay | Admitting: Family Medicine

## 2019-08-24 DIAGNOSIS — M549 Dorsalgia, unspecified: Secondary | ICD-10-CM

## 2019-08-25 ENCOUNTER — Ambulatory Visit: Payer: No Typology Code available for payment source | Attending: Family Medicine | Admitting: Physical Therapy

## 2019-08-25 ENCOUNTER — Other Ambulatory Visit: Payer: Self-pay

## 2019-08-25 DIAGNOSIS — M6281 Muscle weakness (generalized): Secondary | ICD-10-CM

## 2019-08-25 DIAGNOSIS — M25561 Pain in right knee: Secondary | ICD-10-CM

## 2019-08-25 DIAGNOSIS — M546 Pain in thoracic spine: Secondary | ICD-10-CM

## 2019-08-25 NOTE — Therapy (Signed)
[place], [place], [address] Phone: [phone]   Fax:  [phone]  Physical Therapy Evaluation  Patient Details  Name: Deanna Schwartz MRN: [id number] Date of Birth: 07-01-1981 Referring Provider (PT): Rosemarie Ax, MD   Encounter Date: 08/25/2019  PT End of Session - 08/25/19 1059    Visit Number  1    Number of Visits  6    Date for PT Re-Evaluation  10/06/19    Authorization Type  [employer]    PT Start Time  1045    PT Stop Time  1130    PT Time Calculation (min)  45 min    Activity Tolerance  Patient tolerated treatment well    Behavior During Therapy  [facility] for tasks assessed/performed       Past Medical History:  Diagnosis Date  . Allergic rhinitis   . Anemia   . Anxiety   . Asthma   . 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 Chronic constipation   . Constipation   . Depression   . GERD (gastroesophageal reflux disease)   . H. pylori infection 2008   Hx of tx + serology  . Iron deficiency anemia 08/06/2011   Hgb 10.2 and MCV 79 on health screen labs   . Irritable bowel syndrome   . Obesity   . Palpitations   . Panic attack   . Shortness of breath   . Ulcer     Past Surgical History:  Procedure Laterality Date  . FLEXIBLE SIGMOIDOSCOPY  04/14/2008   normal  . NASAL SINUS SURGERY    . UPPER GASTROINTESTINAL ENDOSCOPY      There were no vitals filed for this visit.   Subjective Assessment - 08/25/19 1049    Subjective  Patient reports she has had hamstring pain for the past 4 weeks when she trips and landed weirdly. Currently the hamstring is feeling better, she mainly has pain when she bend it. She also notes mid/upper back pain that has been going on for about 4 months. There was no mechanism of injury for the back pain, she states that COVID in July may have exacerbated her symptoms. She states that sitting or standing for  extended periods will bring on the back pain.    Pertinent History  Patient reported she had torn meniscus in right knee years ago    Limitations  Sitting;Lifting;Standing;House hold activities    How long can you sit comfortably?  1-2 hours    How long can you stand comfortably?  1-2 hours    How long can you walk comfortably?  1-2 hours    Diagnostic tests  Diagnostic US for hamstring, X-ray scheduled 08/25/2019 for upper back    Patient Stated Goals  Reduce pain and improve ability to bend knee    Currently in Pain?  Yes    Pain Score  0-No pain   2/10   Pain Location  Knee   hamstring   Pain Orientation  Right;Posterior    Pain Descriptors / Indicators  Aching    Pain Type  Acute pain    Pain Onset  More than a month ago    Pain Frequency  Intermittent    Aggravating Factors   Bending knee, kneeling    Pain Relieving Factors  Knee brace, resting it on a stool, tries taking advil but doesnt think that helps    Effect of Pain  on Daily Activities  Patient is limited in bending her knee    Multiple Pain Sites  Yes    Pain Score  4    Pain Location  Back    Pain Orientation  Mid    Pain Descriptors / Indicators  Aching    Pain Type  Chronic pain    Pain Onset  More than a month ago    Pain Frequency  Intermittent    Aggravating Factors   Sitting, standing, cooking    Pain Relieving Factors  Salon pas patches, posture corrector and lumbar roll at work (starts to aggravate after a while)    Effect of Pain on Daily Activities  Patient is limited at work         [facility] PT Assessment - 08/25/19 0001      Assessment   Medical Diagnosis  Mid/upper back pain, hasmtring pain    Referring Provider (PT)  Rosemarie Ax, MD    Onset Date/Surgical Date  --   hamstring about 4 weeks ago, thoracic months ago   Hand Dominance  Right    Next MD Visit  09/24/2019    Prior Therapy  No      Precautions   Precautions  None      Restrictions   Weight Bearing Restrictions  No       Balance Screen   Has the patient fallen in the past 6 months  Yes    How many times?  When she tripped on her swing injuring her hamstring    Has the patient had a decrease in activity level because of a fear of falling?   No    Is the patient reluctant to leave their home because of a fear of falling?   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      Cognition   Overall Cognitive Status  Within Functional Limits for tasks assessed      Observation/Other Assessments   Observations  Patient appears in no apparent distress    Focus on Therapeutic Outcomes (FOTO)   44% limitation      Sensation   Light Touch  Appears Intact      Posture/Postural Control   Posture Comments  Forward head and rounded shoulder posture      AROM   Overall AROM Comments  Shoulder AROM grossly WFL, limitations with thoracic motion and report of discomfort      Strength   Strength Assessment Site  Shoulder;Hip;Knee    Right/Left Shoulder  Right;Left    Right Shoulder Flexion  5/5    Right Shoulder Extension  4+/5    Right Shoulder ABduction  5/5    Right Shoulder External Rotation  4+/5    Right Shoulder Horizontal ABduction  4/5    Left Shoulder Flexion  5/5    Left Shoulder Extension  4+/5    Left Shoulder ABduction  5/5    Left Shoulder External Rotation  4+/5    Left Shoulder Horizontal ABduction  4/5    Right/Left Hip  Right;Left    Right Hip Flexion  4/5    Right Hip Extension  4-/5    Right Hip ABduction  4-/5    Left Hip Flexion  4/5    Left Hip Extension  4-/5    Left Hip ABduction  4-/5    Right/Left Knee  Right;Left    Right Knee Flexion  4-/5    Right Knee Extension  5/5    Left Knee Flexion  4+/5    Left Knee Extension  5/5      Flexibility    Soft Tissue Assessment /Muscle Length  yes    Hamstrings  Limited bilaterally, greater limitation on right       Palpation   Palpation comment  Patients denies TTP      Transfers   Transfers  Independent with all Transfers      Ambulation/Gait   Ambulation/Gait  Yes    Ambulation/Gait Assistance  7: Independent    Gait Comments  Mildly antalgic on right                Objective measurements completed on examination: See above findings.      [facility] Adult PT Treatment/Exercise - 08/25/19 0001      Exercises   Exercises  Knee/Hip;Lumbar   thoracic     Lumbar Exercises: Stretches   Other Lumbar Stretch Exercise  Seated thoracic extension and pec stretch over back of chair x10    Other Lumbar Stretch Exercise  Side lying thoracic rotation open book stretch x10      Lumbar Exercises: Standing   Row  10 reps;Theraband    Theraband Level (Row)  Level 2 (Red)      Lumbar Exercises: Supine   Other Supine Lumbar Exercises  Supine horizontal abduction with red x10      Knee/Hip Exercises: Stretches   Passive Hamstring Stretch  3 reps;20 seconds    Passive Hamstring Stretch Limitations  supine with strap      Knee/Hip Exercises: Supine   Bridges  10 reps      Knee/Hip Exercises: Prone   Hamstring Curl  2 sets;10 reps             PT Education - 08/25/19 1323    Education Details  Exam findings, POC, HEP    Person(s) Educated  Patient    Methods  Explanation;Demonstration;Verbal cues;Handout    Comprehension  Verbalized understanding;Returned demonstration;Verbal cues required;Need further instruction       PT Short Term Goals - 08/25/19 1331      PT SHORT TERM GOAL #1   Title  Patient will be independent in HEP to progress in PT    Time  4    Period  Weeks    Status  New    Target Date  09/22/19      PT SHORT TERM GOAL #2   Title  Patient will exhibit 0/10 knee pain with activity or bending.    Time  4    Period  Weeks    Status  New    Target  Date  09/22/19      PT SHORT TERM GOAL #3   Title  Patient will report < or = 2/10 thoracic pain with sitting 1-2 hours to indicate improved tolerance and no limitation at work    Time  4    Period  Weeks    Status  New    Target Date  09/22/19        PT Long Term Goals - 08/25/19 1333      PT LONG TERM GOAL #1   Title  Patient will demonstrate 5/5  knee flexion strength of right to allow for normalized activity    Time  6    Period  Weeks    Status  New    Target Date  10/06/19      PT LONG TERM GOAL #2   Title  Patient will exhibit improved postural control and periscapular strength > or = 4+/5 to allow for improve tolerance to sitting    Time  6    Period  Weeks    Status  New    Target Date  10/06/19      PT LONG TERM GOAL #3   Title  Patient will report < or = 0-1/10 thoracic pain with a full day of work to indicate improved tolerance and no limitations    Time  6    Period  Weeks    Status  New    Target Date  10/06/19      PT LONG TERM GOAL #4   Title  Patient will report improved functional level of < or = 29% FOTO    Time  6    Period  Weeks    Status  New    Target Date  10/06/19             Plan - 08/25/19 1324    Clinical Impression Statement  Patient presents with two separate issues today, one being posterior/lateral knee pain that seems consistent with subacute hamstring strain, and the second being thoracic pain that seems to be related to poor tolerance with postural control. She was provided exercises to address both issues consisting of stretching and strengthening exercises and she would benefit from continued skilled PT to progress hamstring strength and postural control to improve tolerance for activity and return to PLOF.    Personal Factors and Comorbidities  Time since onset of injury/illness/exacerbation;Fitness    Examination-Activity Limitations  Locomotion Level;Sit;Stand;Lift    Examination-Participation Restrictions  Meal  Prep;Community Activity;Cleaning;Laundry    Stability/Clinical Decision Making  Stable/Uncomplicated    Clinical Decision Making  Low    Rehab Potential  Good    PT Frequency  1x / week    PT Duration  8 weeks    PT Treatment/Interventions  Cryotherapy;Electrical Stimulation;Moist Heat;Ultrasound;Neuromuscular re-education;Therapeutic exercise;Therapeutic activities;Gait training;Patient/family education;Manual techniques;Dry needling;Passive range of motion;Taping;Spinal Manipulations;Joint Manipulations    PT Next Visit Plan  Review and assess HEP, manual and stretching for hamstring PRN, progress hamstring strengthening, thoracic mobility PRN, progress postural control and periscapular strengthening exercises    PT Home Exercise Plan  Supine hamstring stretch with strap, prone hamstring curl, bridge; side lying thoracic rotation open book, seated thoracic extension with pec stretch over back of chair, supine horiz abduction with red, row with red    Consulted and Agree with Plan of Care  Patient       Patient will benefit from skilled therapeutic intervention in order to improve the following deficits and impairments:  Postural dysfunction, Decreased strength, Impaired flexibility, Decreased activity tolerance, Pain, Decreased range of motion  Visit Diagnosis: Acute pain of right knee  Pain in thoracic spine  Muscle weakness (generalized)     Problem List Patient Active Problem List   Diagnosis Date Noted  . Hamstring strain, right, subsequent encounter 07/18/2019  . Numbness 06/29/2019  . Low back pain with sciatica 06/29/2019  . Mid back pain 04/21/2019  . Concussion with no loss of consciousness 04/21/2019  . Nonallopathic lesion of cervical region 06/14/2018  . Sternoclavicular joint pain, right 06/14/2018  .  Trapezius muscle spasm 05/22/2017  . Knee MCL sprain 11/13/2016  . Depression 11/10/2016  . Class 2 obesity without serious comorbidity with body mass index (BMI) of  39.0 to 39.9 in adult 10/14/2016  . Nonallopathic lesion of sacral region 10/10/2016  . Panic attack 08/15/2016  . Coccydynia 06/10/2016  . Vasovagal syncope 03/14/2016  . RUQ pain 03/14/2016  . Vitamin D deficiency 11/22/2015  . 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 Nonallopathic lesion of thoracic region 02/21/2015  . Nonallopathic lesion of lumbosacral region 02/21/2015  . Nonallopathic lesion-rib cage 02/21/2015  . Piriformis syndrome of left side 02/21/2015  . Cervicalgia 01/22/2015  . Obesity (BMI 35.0-39.9 without comorbidity) 01/22/2015  . Muscle tension headache 01/22/2015  . Iron deficiency anemia due to chronic blood loss - menses 01/03/2015  . Chronic chest wall pain - mostly left side 01/03/2015  . Well adult exam 11/13/2014  . B12 deficiency 11/13/2014  . Acromioclavicular joint separation, type 1 07/12/2014  . Trochanteric bursitis of left hip 06/20/2014  . Patellofemoral pain syndrome 04/07/2014  . Acute medial meniscal tear 01/25/2014  . Weight gain 11/18/2013  . Anemia, iron deficiency 11/18/2013  . Acute sinusitis 11/05/2013  . Abdominal pain, left upper quadrant-musculoskeletal 05/13/2013  . Iron deficiency anemia 08/06/2011  . Arthralgia 03/24/2011  . INSOMNIA, CHRONIC 10/11/2010  . IBS 04/07/2008  . Palpitations 01/11/2008  . Generalized anxiety disorder 11/25/2007  . Allergic rhinitis 11/25/2007  . GERD 11/25/2007  . SINUSITIS, CHRONIC 04/03/2007  . Asthma 04/03/2007    Hilda Blades, PT, DPT, LAT, ATC 08/25/19  1:54 PM Phone: [phone] Fax: [facility] [address] [place], [place], [address] Phone: [phone]   Fax:  [phone]  Name: Srihita Protheroe MRN: [id number] Date of Birth: 11-Nov-1980

## 2019-08-25 NOTE — Patient Instructions (Signed)
Access Code: [email or web address]  URL: [email or web address]  Date: 08/25/2019  Prepared by: Hilda Blades   Exercises Supine Hamstring Stretch with Strap - 3 reps - 20-30 seconds hold - 1x daily - 7x weekly Prone Knee Flexion - 10 reps - 2 sets - 1x daily - 7x weekly Supine Bridge - 10 reps - 2 sets - 1x daily - 7x weekly Sidelying Thoracic Lumbar Rotation - 10 reps - 1x daily - 7x weekly Seated Thoracic Lumbar Extension with Pectoralis Stretch - 10 reps - 1x daily - 7x weekly Banded Row - 10 reps - 2 sets - 1x daily - 7x weekly Supine Shoulder Horizontal Abduction with Resistance - 10 reps - 2 sets - 1x daily - 7x weekly

## 2019-08-27 ENCOUNTER — Encounter: Payer: Self-pay | Admitting: Internal Medicine

## 2019-08-27 NOTE — Assessment & Plan Note (Signed)
Zpac 

## 2019-08-30 ENCOUNTER — Other Ambulatory Visit: Payer: Self-pay

## 2019-08-30 ENCOUNTER — Ambulatory Visit (INDEPENDENT_AMBULATORY_CARE_PROVIDER_SITE_OTHER)
Admission: RE | Admit: 2019-08-30 | Discharge: 2019-08-30 | Disposition: A | Discharge status: Home / Self Care | Payer: No Typology Code available for payment source | Source: Ambulatory Visit | Attending: Family Medicine | Admitting: Family Medicine

## 2019-08-30 DIAGNOSIS — M549 Dorsalgia, unspecified: Secondary | ICD-10-CM | POA: Diagnosis not present

## 2019-08-31 ENCOUNTER — Telehealth: Payer: Self-pay | Admitting: Family Medicine

## 2019-08-31 NOTE — Telephone Encounter (Signed)
Informed of results.   Rosemarie Ax, MD [facility] 08/31/2019, 9:07 AM

## 2019-09-05 ENCOUNTER — Ambulatory Visit: Payer: No Typology Code available for payment source | Attending: Internal Medicine

## 2019-09-05 DIAGNOSIS — Z20822 Contact with and (suspected) exposure to covid-19: Secondary | ICD-10-CM

## 2019-09-07 ENCOUNTER — Telehealth: Payer: Self-pay | Admitting: Oncology

## 2019-09-07 ENCOUNTER — Inpatient Hospital Stay: Payer: No Typology Code available for payment source

## 2019-09-07 ENCOUNTER — Inpatient Hospital Stay: Payer: No Typology Code available for payment source | Attending: Oncology | Admitting: Oncology

## 2019-09-07 LAB — NOVEL CORONAVIRUS, NAA: SARS-CoV-2, NAA: NOT DETECTED

## 2019-09-07 NOTE — Telephone Encounter (Signed)
Returned patient's phone call regarding cancelling 12/30 appointment, due to covid-19 patient will call back to reschedule.

## 2019-09-08 ENCOUNTER — Ambulatory Visit: Payer: No Typology Code available for payment source | Admitting: Physical Therapy

## 2019-09-15 ENCOUNTER — Ambulatory Visit: Payer: No Typology Code available for payment source | Attending: Internal Medicine

## 2019-09-15 DIAGNOSIS — Z20822 Contact with and (suspected) exposure to covid-19: Secondary | ICD-10-CM | POA: Insufficient documentation

## 2019-09-17 LAB — NOVEL CORONAVIRUS, NAA: SARS-CoV-2, NAA: NOT DETECTED

## 2019-09-30 ENCOUNTER — Other Ambulatory Visit: Payer: No Typology Code available for payment source

## 2019-10-13 ENCOUNTER — Other Ambulatory Visit: Payer: No Typology Code available for payment source

## 2019-10-13 ENCOUNTER — Ambulatory Visit: Payer: No Typology Code available for payment source | Attending: Internal Medicine

## 2019-10-13 DIAGNOSIS — Z20822 Contact with and (suspected) exposure to covid-19: Secondary | ICD-10-CM

## 2019-10-14 ENCOUNTER — Ambulatory Visit (INDEPENDENT_AMBULATORY_CARE_PROVIDER_SITE_OTHER): Payer: No Typology Code available for payment source | Admitting: Family

## 2019-10-14 ENCOUNTER — Ambulatory Visit (INDEPENDENT_AMBULATORY_CARE_PROVIDER_SITE_OTHER)
Admission: RE | Admit: 2019-10-14 | Discharge: 2019-10-14 | Disposition: A | Discharge status: Home / Self Care | Payer: No Typology Code available for payment source | Source: Ambulatory Visit | Attending: Family | Admitting: Family

## 2019-10-14 ENCOUNTER — Other Ambulatory Visit: Payer: Self-pay

## 2019-10-14 ENCOUNTER — Other Ambulatory Visit: Payer: Self-pay | Admitting: Family

## 2019-10-14 DIAGNOSIS — R059 Cough, unspecified: Secondary | ICD-10-CM

## 2019-10-14 DIAGNOSIS — R05 Cough: Secondary | ICD-10-CM | POA: Diagnosis not present

## 2019-10-14 DIAGNOSIS — J45909 Unspecified asthma, uncomplicated: Secondary | ICD-10-CM

## 2019-10-14 LAB — NOVEL CORONAVIRUS, NAA: SARS-CoV-2, NAA: NOT DETECTED

## 2019-10-14 MED ORDER — ALBUTEROL SULFATE HFA 108 (90 BASE) MCG/ACT IN AERS: INHALATION_SPRAY | RESPIRATORY_TRACT | 5 refills | Status: DC

## 2019-10-14 MED ORDER — AZITHROMYCIN 250 MG PO TABS: ORAL_TABLET | ORAL | 0 refills | Status: DC

## 2019-10-14 MED ORDER — BUDESONIDE-FORMOTEROL FUMARATE 160-4.5 MCG/ACT IN AERO: 2.0000 | INHALATION_SPRAY | Freq: Two times a day (BID) | RESPIRATORY_TRACT | 1 refills | Status: DC

## 2019-10-14 MED ORDER — PREDNISONE 20 MG PO TABS: 40.0000 mg | ORAL_TABLET | Freq: Every day | ORAL | 0 refills | Status: DC

## 2019-10-14 MED FILL — ALBUTEROL SULFATE HFA 108 (: 108 (90 BAS | 25 days supply | Qty: 18 | Fill #0

## 2019-10-14 MED FILL — predniSONE 20 MG TABS: 20 | 5 days supply | Qty: 10 | Fill #0

## 2019-10-14 MED FILL — AZITHROMYCIN 250 MG TABLET: 250 | 5 days supply | Qty: 6 | Fill #0

## 2019-10-14 MED FILL — SYMBICORT 160-4.5 MCG INH: 160-4.5 | 30 days supply | Qty: 10 | Fill #0

## 2019-10-14 NOTE — Progress Notes (Signed)
Deanna Schwartz is a 39 y.o. female with the following history as recorded in [employer]:  Patient Active Problem List   Diagnosis Date Noted  . Hamstring strain, right, subsequent encounter 07/18/2019  . Numbness 06/29/2019  . Low back pain with sciatica 06/29/2019  . Mid back pain 04/21/2019  . Concussion with no loss of consciousness 04/21/2019  . Nonallopathic lesion of cervical region 06/14/2018  . Sternoclavicular joint pain, right 06/14/2018  . Trapezius muscle spasm 05/22/2017  . Knee MCL sprain 11/13/2016  . Depression 11/10/2016  . Class 2 obesity without serious comorbidity with body mass index (BMI) of 39.0 to 39.9 in adult 10/14/2016  . Nonallopathic lesion of sacral region 10/10/2016  . Panic attack 08/15/2016  . Coccydynia 06/10/2016  . Vasovagal syncope 03/14/2016  . RUQ pain 03/14/2016  . Vitamin D deficiency 11/22/2015  . 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 Nonallopathic lesion of thoracic region 02/21/2015  . Nonallopathic lesion of lumbosacral region 02/21/2015  . Nonallopathic lesion-rib cage 02/21/2015  . Piriformis syndrome of left side 02/21/2015  . Cervicalgia 01/22/2015  . Obesity (BMI 35.0-39.9 without comorbidity) 01/22/2015  . Muscle tension headache 01/22/2015  . Iron deficiency anemia due to chronic blood loss - menses 01/03/2015  . Chronic chest wall pain - mostly left side 01/03/2015  . Well adult exam 11/13/2014  . B12 deficiency 11/13/2014  . Acromioclavicular joint separation, type 1 07/12/2014  . Trochanteric bursitis of left hip 06/20/2014  . Patellofemoral pain syndrome 04/07/2014  . Acute medial meniscal tear 01/25/2014  . Weight gain 11/18/2013  . Anemia, iron deficiency 11/18/2013  . Acute sinusitis 11/05/2013  . Abdominal pain, left upper quadrant-musculoskeletal 05/13/2013  . Iron deficiency anemia 08/06/2011  . Arthralgia 03/24/2011  . INSOMNIA, CHRONIC 10/11/2010  . IBS 04/07/2008  . Palpitations 01/11/2008  . Generalized  anxiety disorder 11/25/2007  . Allergic rhinitis 11/25/2007  . GERD 11/25/2007  . SINUSITIS, CHRONIC 04/03/2007  . Asthma 04/03/2007    Current Outpatient Medications  Medication Sig Dispense Refill  . albuterol (VENTOLIN HFA) 108 (90 Base) MCG/ACT inhaler INHALE 1 TO 2 PUFFS BY MOUTH INTO THE LUNGS EVERY 6 HOURS AS NEEDED FOR WHEEZING OR SHORTNESS OF BREATH 18 g 5  . cetirizine (ZYRTEC) 10 MG tablet Take 1 tablet (10 mg total) by mouth daily. 90 tablet 3  . azithromycin (ZITHROMAX Z-PAK) 250 MG tablet As directed 6 tablet 0  . azithromycin (ZITHROMAX) 250 MG tablet 2 tabs po qd x 1 day; 1 tablet per day x 4 days; 6 tablet 0  . budesonide-formoterol (SYMBICORT) 160-4.5 MCG/ACT inhaler Inhale 2 puffs into the lungs 2 (two) times daily. 1 Inhaler 1  . Cholecalciferol (VITAMIN D3) 2000 units capsule Take 1 capsule (2,000 Units total) by mouth daily. 100 capsule 3  . Cyanocobalamin (VITAMIN B-12) 1000 MCG SUBL Place 1 tablet (1,000 mcg total) under the tongue daily. 100 tablet 3  . cyclobenzaprine (FLEXERIL) 5 MG tablet Take 1 tablet (5 mg total) by mouth 2 (two) times daily as needed for muscle spasms. 30 tablet 0  . meclizine (ANTIVERT) 12.5 MG tablet Take 1 tablet (12.5 mg total) by mouth 3 (three) times daily as needed for dizziness. 30 tablet 1  . Omega-3 Fatty Acids (OMEGA-3 PO)     . predniSONE (DELTASONE) 20 MG tablet Take 2 tablets (40 mg total) by mouth daily with breakfast. 10 tablet 0  . triamcinolone cream (KENALOG) 0.1 % Apply 1 application topically 2 (two) times daily. 30 g 0  No current facility-administered medications for this visit.    Allergies: Augmentin [amoxicillin-pot clavulanate], Cephalexin, and Iodine  Past Medical History:  Diagnosis Date  . Allergic rhinitis   . Anemia   . Anxiety   . Asthma   . 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 Chronic constipation   . Constipation   . Depression   . GERD (gastroesophageal  reflux disease)   . H. pylori infection 2008   Hx of tx + serology  . Iron deficiency anemia 08/06/2011   Hgb 10.2 and MCV 79 on health screen labs   . Irritable bowel syndrome   . Obesity   . Palpitations   . Panic attack   . Shortness of breath   . Ulcer     Past Surgical History:  Procedure Laterality Date  . FLEXIBLE SIGMOIDOSCOPY  04/14/2008   normal  . NASAL SINUS SURGERY    . UPPER GASTROINTESTINAL ENDOSCOPY      Family History  Problem Relation Age of Onset  . Diabetes Unknown        grandmother  . Hypertension Unknown   . Hypertension Mother   . Anxiety disorder Mother   . Obesity Mother   . Hypertension Father   . Diabetes Maternal Aunt   . Diabetes Maternal Aunt   . Diabetes Paternal Grandmother   . Colon polyps Paternal Uncle        x4  . Colon cancer Neg Hx     Social History   Tobacco Use  . Smoking status: Never Smoker  . Smokeless tobacco: Never Used  Substance Use Topics  . Alcohol use: Yes    Alcohol/week: 0.0 standard drinks    Comment: Social maybe twice amonth, beer    Subjective:   I connected with Jacobi Daquila on 10/14/19 at 11:20 AM EST by a video enabled telemedicine application and verified that I am speaking with the correct person using two identifiers.   I discussed the limitations of evaluation and management by telemedicine and the availability of in person appointments. The patient expressed understanding and agreed to proceed. Provider in office/ patient is at home; provider and patient are only 2 people on video call.   Has been having increased problems with her asthma for the past 2 weeks; does not like QVAR for daily treatment; would prefer an inhaler as opposed to a powder;  coughing worse at night/ would like to get CXR to make sure her lungs are clear; had a negative COVID test yesterday; having to use her albuterol inhaler more frequently lately;    Objective:  There were no vitals filed for this visit.  General:  Well developed, well nourished, in no acute distress  Head: Normocephalic and atraumatic  Lungs: Respirations unlabored;  Neurologic: Alert and oriented; speech intact; face symmetrical;   Assessment:  1. Cough   2. Acute asthmatic bronchitis     Plan:  Update CXR- negative COVID test; d/c QVAR- try changing to Symbicort 160 2 puffs bid; Rx for Prednisone and Z-pak for acute symptoms; increase fluids, rest; Plan for a 1 month follow-up with her PCP to discus response to Symbicort;    No follow-ups on file.  Orders Placed This Encounter  Procedures  . DG Chest 2 View    Standing Status:   Future    Standing Expiration Date:   12/11/2020    Order Specific Question:   Reason for Exam (SYMPTOM  OR DIAGNOSIS REQUIRED)  Answer:   cough x 2 weeks    Order Specific Question:   Is patient pregnant?    Answer:   No    Order Specific Question:   Preferred imaging location?    Answer:   [facility]    Order Specific Question:   Radiology Contrast Protocol - do NOT remove file path    Answer:   [email or web address]    Requested Prescriptions   Signed Prescriptions Disp Refills  . budesonide-formoterol (SYMBICORT) 160-4.5 MCG/ACT inhaler 1 Inhaler 1    Sig: Inhale 2 puffs into the lungs 2 (two) times daily.  [name] azithromycin (ZITHROMAX) 250 MG tablet 6 tablet 0    Sig: 2 tabs po qd x 1 day; 1 tablet per day x 4 days;  . predniSONE (DELTASONE) 20 MG tablet 10 tablet 0    Sig: Take 2 tablets (40 mg total) by mouth daily with breakfast.  . albuterol (VENTOLIN HFA) 108 (90 Base) MCG/ACT inhaler 18 g 5    Sig: INHALE 1 TO 2 PUFFS BY MOUTH INTO THE LUNGS EVERY 6 HOURS AS NEEDED FOR WHEEZING OR SHORTNESS OF BREATH

## 2019-11-28 ENCOUNTER — Telehealth: Payer: Self-pay

## 2019-11-28 DIAGNOSIS — Z0000 Encounter for general adult medical examination without abnormal findings: Secondary | ICD-10-CM

## 2019-11-28 NOTE — Telephone Encounter (Signed)
Labs ordered, message sent to pt.

## 2019-11-28 NOTE — Telephone Encounter (Signed)
New message   The patient is requesting labs prior to appt on  3.31.21

## 2019-11-30 ENCOUNTER — Ambulatory Visit: Payer: No Typology Code available for payment source | Attending: Internal Medicine

## 2019-11-30 DIAGNOSIS — Z20822 Contact with and (suspected) exposure to covid-19: Secondary | ICD-10-CM

## 2019-12-01 LAB — NOVEL CORONAVIRUS, NAA: SARS-CoV-2, NAA: NOT DETECTED

## 2019-12-01 LAB — SARS-COV-2, NAA 2 DAY TAT

## 2019-12-07 ENCOUNTER — Encounter: Payer: No Typology Code available for payment source | Admitting: Internal Medicine

## 2019-12-15 ENCOUNTER — Other Ambulatory Visit: Payer: Self-pay | Admitting: Internal Medicine

## 2019-12-15 ENCOUNTER — Ambulatory Visit (INDEPENDENT_AMBULATORY_CARE_PROVIDER_SITE_OTHER): Payer: No Typology Code available for payment source | Admitting: Internal Medicine

## 2019-12-15 ENCOUNTER — Other Ambulatory Visit: Payer: Self-pay

## 2019-12-15 ENCOUNTER — Encounter: Payer: Self-pay | Admitting: Internal Medicine

## 2019-12-15 VITALS — BP 112/82 | HR 84 | Temp 98.3°F | Ht 63.0 in | Wt 231.0 lb

## 2019-12-15 DIAGNOSIS — E538 Deficiency of other specified B group vitamins: Secondary | ICD-10-CM | POA: Diagnosis not present

## 2019-12-15 DIAGNOSIS — Z0000 Encounter for general adult medical examination without abnormal findings: Secondary | ICD-10-CM

## 2019-12-15 DIAGNOSIS — J301 Allergic rhinitis due to pollen: Secondary | ICD-10-CM

## 2019-12-15 DIAGNOSIS — J4520 Mild intermittent asthma, uncomplicated: Secondary | ICD-10-CM | POA: Diagnosis not present

## 2019-12-15 DIAGNOSIS — E559 Vitamin D deficiency, unspecified: Secondary | ICD-10-CM

## 2019-12-15 LAB — LIPID PANEL
Cholesterol: 175 mg/dL (ref 0–200)
HDL: 53.7 mg/dL (ref >39.00)
LDL Cholesterol: 97 mg/dL (ref 0–99)
NonHDL: 121.16
Total CHOL/HDL Ratio: 3
Triglycerides: 119 mg/dL (ref 0.0–149.0)
VLDL: 23.8 mg/dL (ref 0.0–40.0)

## 2019-12-15 LAB — CBC WITH DIFFERENTIAL/PLATELET
Basophils Absolute: 0 10*3/uL (ref 0.0–0.1)
Basophils Relative: 0.7 % (ref 0.0–3.0)
Eosinophils Absolute: 0.6 10*3/uL (ref 0.0–0.7)
Eosinophils Relative: 10.4 % — ABNORMAL HIGH (ref 0.0–5.0)
HCT: 32.5 % — ABNORMAL LOW (ref 36.0–46.0)
Hemoglobin: 10.3 g/dL — ABNORMAL LOW (ref 12.0–15.0)
Lymphocytes Relative: 30 % (ref 12.0–46.0)
Lymphs Abs: 1.9 10*3/uL (ref 0.7–4.0)
MCHC: 31.8 g/dL (ref 30.0–36.0)
MCV: 76.4 fl — ABNORMAL LOW (ref 78.0–100.0)
Monocytes Absolute: 0.5 10*3/uL (ref 0.1–1.0)
Monocytes Relative: 8.3 % (ref 3.0–12.0)
Neutro Abs: 3.1 10*3/uL (ref 1.4–7.7)
Neutrophils Relative %: 50.6 % (ref 43.0–77.0)
Platelets: 262 10*3/uL (ref 150.0–400.0)
RBC: 4.26 Mil/uL (ref 3.87–5.11)
RDW: 14.8 % (ref 11.5–15.5)
WBC: 6.2 10*3/uL (ref 4.0–10.5)

## 2019-12-15 LAB — URINALYSIS
Bilirubin Urine: NEGATIVE
Ketones, ur: NEGATIVE
Leukocytes,Ua: NEGATIVE
Nitrite: NEGATIVE
Specific Gravity, Urine: 1.02 (ref 1.000–1.030)
Total Protein, Urine: NEGATIVE
Urine Glucose: NEGATIVE
Urobilinogen, UA: 0.2 (ref 0.0–1.0)
pH: 5.5 (ref 5.0–8.0)

## 2019-12-15 LAB — BASIC METABOLIC PANEL
BUN: 15 mg/dL (ref 6–23)
CO2: 27 mEq/L (ref 19–32)
Calcium: 9.2 mg/dL (ref 8.4–10.5)
Chloride: 103 mEq/L (ref 96–112)
Creatinine, Ser: 0.76 mg/dL (ref 0.40–1.20)
GFR: 103.15 mL/min (ref >60.00)
Glucose, Bld: 89 mg/dL (ref 70–99)
Potassium: 4.4 mEq/L (ref 3.5–5.1)
Sodium: 136 mEq/L (ref 135–145)

## 2019-12-15 LAB — HEPATIC FUNCTION PANEL
ALT: 15 U/L (ref 0–35)
AST: 18 U/L (ref 0–37)
Albumin: 4 g/dL (ref 3.5–5.2)
Alkaline Phosphatase: 60 U/L (ref 39–117)
Bilirubin, Direct: 0 mg/dL (ref 0.0–0.3)
Total Bilirubin: 0.3 mg/dL (ref 0.2–1.2)
Total Protein: 7.4 g/dL (ref 6.0–8.3)

## 2019-12-15 LAB — VITAMIN D 25 HYDROXY (VIT D DEFICIENCY, FRACTURES): VITD: 28.83 ng/mL — ABNORMAL LOW (ref 30.00–100.00)

## 2019-12-15 LAB — TSH: TSH: 1.82 u[IU]/mL (ref 0.35–4.50)

## 2019-12-15 LAB — VITAMIN B12: Vitamin B-12: 249 pg/mL (ref 211–911)

## 2019-12-15 MED ORDER — VITAMIN D3 1.25 MG (50000 UT) PO CAPS: 1.0000 | ORAL_CAPSULE | ORAL | 0 refills | Status: DC

## 2019-12-15 MED ORDER — B COMPLEX PO TABS: 1.0000 | ORAL_TABLET | Freq: Every day | ORAL | 3 refills | Status: DC

## 2019-12-15 MED ORDER — PSEUDOEPHEDRINE HCL ER 120 MG PO TB12: 120.0000 mg | ORAL_TABLET | Freq: Two times a day (BID) | ORAL | 1 refills | Status: AC | PRN

## 2019-12-15 MED ORDER — BUDESONIDE-FORMOTEROL FUMARATE 160-4.5 MCG/ACT IN AERO: 2.0000 | INHALATION_SPRAY | Freq: Two times a day (BID) | RESPIRATORY_TRACT | 3 refills | Status: DC

## 2019-12-15 MED FILL — VITAMIN D3 50,000 UNITS CAP: 1.25 MG | 42 days supply | Qty: 6 | Fill #0

## 2019-12-15 NOTE — Assessment & Plan Note (Signed)
Worse Sudafed prn Zyxal po qd

## 2019-12-15 NOTE — Assessment & Plan Note (Signed)
B 12

## 2019-12-15 NOTE — Assessment & Plan Note (Signed)

## 2019-12-15 NOTE — Progress Notes (Signed)
Subjective:  Patient ID: Deanna Schwartz, female    DOB: [birth year]  Age: 39 y.o. MRN: [id number]  CC: No chief complaint on file.   HPI [facility] presents for a well exam F/u asthma is worse C/o allergies  Outpatient Medications Prior to Visit  Medication Sig Dispense Refill  . albuterol (VENTOLIN HFA) 108 (90 Base) MCG/ACT inhaler INHALE 1 TO 2 PUFFS BY MOUTH INTO THE LUNGS EVERY 6 HOURS AS NEEDED FOR WHEEZING OR SHORTNESS OF BREATH 18 g 5  . budesonide-formoterol (SYMBICORT) 160-4.5 MCG/ACT inhaler Inhale 2 puffs into the lungs 2 (two) times daily. 1 Inhaler 1  . cetirizine (ZYRTEC) 10 MG tablet Take 1 tablet (10 mg total) by mouth daily. 90 tablet 3  . Cholecalciferol (VITAMIN D3) 2000 units capsule Take 1 capsule (2,000 Units total) by mouth daily. 100 capsule 3  . azithromycin (ZITHROMAX Z-PAK) 250 MG tablet As directed 6 tablet 0  . azithromycin (ZITHROMAX) 250 MG tablet 2 tabs po qd x 1 day; 1 tablet per day x 4 days; 6 tablet 0  . Cyanocobalamin (VITAMIN B-12) 1000 MCG SUBL Place 1 tablet (1,000 mcg total) under the tongue daily. 100 tablet 3  . cyclobenzaprine (FLEXERIL) 5 MG tablet Take 1 tablet (5 mg total) by mouth 2 (two) times daily as needed for muscle spasms. 30 tablet 0  . meclizine (ANTIVERT) 12.5 MG tablet Take 1 tablet (12.5 mg total) by mouth 3 (three) times daily as needed for dizziness. 30 tablet 1  . Omega-3 Fatty Acids (OMEGA-3 PO)     . predniSONE (DELTASONE) 20 MG tablet Take 2 tablets (40 mg total) by mouth daily with breakfast. 10 tablet 0   No facility-administered medications prior to visit.    ROS: Review of Systems  Constitutional: Positive for unexpected weight change. Negative for activity change, appetite change, chills and fatigue.  HENT: Negative for congestion, mouth sores and sinus pressure.   Eyes: Negative for visual disturbance.  Respiratory: Positive for shortness of breath and wheezing. Negative for cough and chest tightness.     Gastrointestinal: Negative for abdominal pain and nausea.  Genitourinary: Negative for difficulty urinating, frequency and vaginal pain.  Musculoskeletal: Negative for back pain and gait problem.  Skin: Negative for pallor and rash.  Neurological: Negative for dizziness, tremors, weakness, numbness and headaches.  Psychiatric/Behavioral: Negative for confusion, sleep disturbance and suicidal ideas.    Objective:  BP 112/82 (BP Location: Left Arm, Patient Position: Sitting, Cuff Size: Large)   Pulse 84   Temp 98.3 F (36.8 C) (Oral)   Ht 5\' 3"  (1.6 m)   Wt 231 lb (104.8 kg)   SpO2 97%   BMI 40.92 kg/m   BP Readings from Last 3 Encounters:  12/15/19 112/82  08/15/19 125/80  07/18/19 114/78    Wt Readings from Last 3 Encounters:  12/15/19 231 lb (104.8 kg)  08/15/19 215 lb (97.5 kg)  07/18/19 215 lb (97.5 kg)    Physical Exam Constitutional:      General: She is not in acute distress.    Appearance: She is well-developed.  HENT:     Head: Normocephalic.     Right Ear: External ear normal.     Left Ear: External ear normal.     Nose: Nose normal.  Eyes:     General:        Right eye: No discharge.        Left eye: No discharge.     Conjunctiva/sclera: Conjunctivae normal.  Pupils: Pupils are equal, round, and reactive to light.  Neck:     Thyroid: No thyromegaly.     Vascular: No JVD.     Trachea: No tracheal deviation.  Cardiovascular:     Rate and Rhythm: Normal rate and regular rhythm.     Heart sounds: Normal heart sounds.  Pulmonary:     Effort: No respiratory distress.     Breath sounds: No stridor. No wheezing.  Abdominal:     General: Bowel sounds are normal. There is no distension.     Palpations: Abdomen is soft. There is no mass.     Tenderness: There is no abdominal tenderness. There is no guarding or rebound.  Musculoskeletal:        General: No tenderness.     Cervical back: Normal range of motion and neck supple.  Lymphadenopathy:      Cervical: No cervical adenopathy.  Skin:    Findings: No erythema or rash.  Neurological:     Cranial Nerves: No cranial nerve deficit.     Motor: No abnormal muscle tone.     Coordination: Coordination normal.     Deep Tendon Reflexes: Reflexes normal.  Psychiatric:        Behavior: Behavior normal.        Thought Content: Thought content normal.        Judgment: Judgment normal.     Lab Results  Component Value Date   WBC 6.7 08/17/2019   HGB 11.9 (L) 08/17/2019   HCT 37.3 08/17/2019   PLT 254.0 08/17/2019   GLUCOSE 95 08/17/2019   CHOL 171 10/27/2018   TRIG 111.0 10/27/2018   HDL 44.70 10/27/2018   LDLDIRECT 86.0 10/13/2017   LDLCALC 104 (H) 10/27/2018   ALT 14 10/27/2018   AST 16 10/27/2018   NA 136 08/17/2019   K 4.0 08/17/2019   CL 101 08/17/2019   CREATININE 0.81 08/17/2019   BUN 16 08/17/2019   CO2 30 08/17/2019   TSH 0.97 08/17/2019   HGBA1C 5.7 12/16/2016    DG Chest 2 View  Result Date: 10/14/2019 CLINICAL DATA:  Cough EXAM: CHEST - 2 VIEW COMPARISON:  07/19/2018 FINDINGS: The heart size and mediastinal contours are within normal limits. Both lungs are clear. The visualized skeletal structures are unremarkable. IMPRESSION: No acute abnormality of the lungs. Electronically Signed   By: Eddie Candle M.D.   On: 10/14/2019 14:11    Assessment & Plan:   There are no diagnoses linked to this encounter.   No orders of the defined types were placed in this encounter.    Follow-up: No follow-ups on file.  [name], MD

## 2019-12-15 NOTE — Addendum Note (Signed)
Addended by: Trenda Moots on: [id number] 10:14 AM   Modules accepted: Orders

## 2019-12-15 NOTE — Assessment & Plan Note (Signed)
Vit D 

## 2019-12-15 NOTE — Assessment & Plan Note (Signed)
Worse Sudafed prn Zyxal po qd Symbicort

## 2020-04-10 ENCOUNTER — Other Ambulatory Visit: Payer: No Typology Code available for payment source

## 2020-04-10 ENCOUNTER — Other Ambulatory Visit: Payer: Self-pay

## 2020-04-10 DIAGNOSIS — Z20822 Contact with and (suspected) exposure to covid-19: Secondary | ICD-10-CM

## 2020-04-11 ENCOUNTER — Inpatient Hospital Stay: Payer: No Typology Code available for payment source | Admitting: Oncology

## 2020-04-11 ENCOUNTER — Inpatient Hospital Stay: Payer: No Typology Code available for payment source

## 2020-04-11 LAB — SARS-COV-2, NAA 2 DAY TAT

## 2020-04-11 LAB — NOVEL CORONAVIRUS, NAA: SARS-CoV-2, NAA: NOT DETECTED

## 2020-05-03 ENCOUNTER — Telehealth: Payer: Self-pay | Admitting: Oncology

## 2020-05-03 NOTE — Telephone Encounter (Signed)
Rescheduled cancelled appointments per 8/26 scheduling message. Patient is aware of appointment date and time.

## 2020-05-09 ENCOUNTER — Ambulatory Visit: Payer: No Typology Code available for payment source | Admitting: Family Medicine

## 2020-05-09 NOTE — Progress Notes (Deleted)
Candida Vetter - 39 y.o. female MRN [id number]  Date of birth: Jun 28, 1981  SUBJECTIVE:  Including CC & ROS.  No chief complaint on file.   Areal Cochrane is a 39 y.o. female that is  ***.  ***   Review of Systems See HPI   HISTORY: Past Medical, Surgical, Social, and Family History Reviewed & Updated per EMR.   Pertinent Historical Findings include:  Past Medical History:  Diagnosis Date   Allergic rhinitis    Anemia    Anxiety    Asthma    B12 deficiency with anti-parietal cell antibodies + 03/25/2011   New 03/2011    Chronic constipation    Constipation    Depression    GERD (gastroesophageal reflux disease)    H. pylori infection 2008   Hx of tx + serology   Iron deficiency anemia 08/06/2011   Hgb 10.2 and MCV 79 on health screen labs    Irritable bowel syndrome    Obesity    Palpitations    Panic attack    Shortness of breath    Ulcer     Past Surgical History:  Procedure Laterality Date   FLEXIBLE SIGMOIDOSCOPY  04/14/2008   normal   NASAL SINUS SURGERY     UPPER GASTROINTESTINAL ENDOSCOPY      Family History  Problem Relation Age of Onset   Diabetes Unknown        grandmother   Hypertension Unknown    Hypertension Mother    Anxiety disorder Mother    Obesity Mother    Hypertension Father    Diabetes Maternal Aunt    Diabetes Maternal Aunt    Diabetes Paternal Grandmother    Colon polyps Paternal Uncle        x4   Colon cancer Neg Hx     Social History   Socioeconomic History   Marital status: Married    Spouse name: Not on file   Number of children: 3   Years of education: Not on file   Highest education level: Not on file  Occupational History   Occupation: Financial controller [employer]    Employer: [employer]  Tobacco Use   Smoking status: Never Smoker   Smokeless tobacco: Never Used  Scientific laboratory technician Use: Never used  Substance and Sexual Activity   Alcohol use: Yes    Alcohol/week: 0.0 standard  drinks    Comment: Social maybe twice amonth, beer   Drug use: No   Sexual activity: Yes    Birth control/protection: None  Other Topics Concern   Not on file  Social History Narrative   HSG, [employer] in [place]. Occupation: [employer]. married  in Oct 2009. Son born in 2009, [place] dtrs - '07, '11. Marriage in good health.   Social Determinants of Health   Financial Resource Strain:    Difficulty of Paying Living Expenses: Not on file  Food Insecurity:    Worried About Charity fundraiser in the Last Year: Not on file   [employer] of Food in the Last Year: Not on file  Transportation Needs:    Lack of Transportation (Medical): Not on file   Lack of Transportation (Non-Medical): Not on file  Physical Activity:    Days of Exercise per Week: Not on file   Minutes of Exercise per Session: Not on file  Stress:    Feeling of Stress : Not on file  Social Connections:    Frequency of  Communication with Friends and Family: Not on file   Frequency of Social Gatherings with Friends and Family: Not on file   Attends Religious Services: Not on file   Active Member of Clubs or Organizations: Not on file   Attends Archivist Meetings: Not on file   Marital Status: Not on file  Intimate Partner Violence:    Fear of Current or Ex-Partner: Not on file   Emotionally Abused: Not on file   Physically Abused: Not on file   Sexually Abused: Not on file     PHYSICAL EXAM:  VS: There were no vitals taken for this visit. Physical Exam Gen: NAD, alert, cooperative with exam, well-appearing MSK:  ***      ASSESSMENT & PLAN:   No problem-specific Assessment & Plan notes found for this encounter.

## 2020-05-22 ENCOUNTER — Other Ambulatory Visit: Payer: Self-pay | Admitting: Oncology

## 2020-05-22 DIAGNOSIS — D509 Iron deficiency anemia, unspecified: Secondary | ICD-10-CM

## 2020-05-23 ENCOUNTER — Inpatient Hospital Stay: Payer: No Typology Code available for payment source | Attending: Oncology

## 2020-05-23 ENCOUNTER — Inpatient Hospital Stay: Payer: No Typology Code available for payment source | Admitting: Oncology

## 2020-05-23 ENCOUNTER — Other Ambulatory Visit: Payer: Self-pay

## 2020-05-23 VITALS — BP 119/66 | HR 83 | Temp 98.7°F | Resp 18 | Ht 63.0 in | Wt 239.1 lb

## 2020-05-23 DIAGNOSIS — N920 Excessive and frequent menstruation with regular cycle: Secondary | ICD-10-CM | POA: Insufficient documentation

## 2020-05-23 DIAGNOSIS — D509 Iron deficiency anemia, unspecified: Secondary | ICD-10-CM

## 2020-05-23 DIAGNOSIS — D500 Iron deficiency anemia secondary to blood loss (chronic): Secondary | ICD-10-CM | POA: Insufficient documentation

## 2020-05-23 DIAGNOSIS — Z79899 Other long term (current) drug therapy: Secondary | ICD-10-CM | POA: Insufficient documentation

## 2020-05-23 LAB — IRON AND TIBC
Iron: 25 ug/dL — ABNORMAL LOW (ref 41–142)
Saturation Ratios: 5 % — ABNORMAL LOW (ref 21–57)
TIBC: 466 ug/dL — ABNORMAL HIGH (ref 236–444)
UIBC: 441 ug/dL — ABNORMAL HIGH (ref 120–384)

## 2020-05-23 LAB — CBC WITH DIFFERENTIAL (CANCER CENTER ONLY)
Abs Immature Granulocytes: 0.03 10*3/uL (ref 0.00–0.07)
Basophils Absolute: 0.1 10*3/uL (ref 0.0–0.1)
Basophils Relative: 1 %
Eosinophils Absolute: 0.9 10*3/uL — ABNORMAL HIGH (ref 0.0–0.5)
Eosinophils Relative: 11 %
HCT: 33.8 % — ABNORMAL LOW (ref 36.0–46.0)
Hemoglobin: 10.1 g/dL — ABNORMAL LOW (ref 12.0–15.0)
Immature Granulocytes: 0 %
Lymphocytes Relative: 26 %
Lymphs Abs: 2.2 10*3/uL (ref 0.7–4.0)
MCH: 22.6 pg — ABNORMAL LOW (ref 26.0–34.0)
MCHC: 29.9 g/dL — ABNORMAL LOW (ref 30.0–36.0)
MCV: 75.8 fL — ABNORMAL LOW (ref 80.0–100.0)
Monocytes Absolute: 0.6 10*3/uL (ref 0.1–1.0)
Monocytes Relative: 7 %
Neutro Abs: 4.5 10*3/uL (ref 1.7–7.7)
Neutrophils Relative %: 55 %
Platelet Count: 266 10*3/uL (ref 150–400)
RBC: 4.46 MIL/uL (ref 3.87–5.11)
RDW: 15.5 % (ref 11.5–15.5)
WBC Count: 8.2 10*3/uL (ref 4.0–10.5)
nRBC: 0 % (ref 0.0–0.2)

## 2020-05-23 LAB — FERRITIN: Ferritin: 9 ng/mL — ABNORMAL LOW (ref 11–307)

## 2020-05-23 NOTE — Progress Notes (Signed)
Hematology and Oncology Follow Up Visit  Deanna Schwartz [id number] 07/07/1981 39 y.o. 05/23/2020 10:09 AM Plotnikov, Evie Lacks, MDPlotnikov, Evie Lacks, MD   Principle Diagnosis: 39 year old woman with anemia diagnosed in 2019.  She was found to have iron deficiency related to menorrhagia.    Prior Therapy: Status post Feraheme infusion completed on Jan 06, 2018 for a total of 1020 mg.  Current therapy: Oral iron supplement daily.  Interim History: Deanna Schwartz is here for repeat follow-up.  Since her last visit, she reports no major changes in her health.  She does report some increased fatigue and tiredness associated with heavy menstrual cycles.  She continues to oral iron therapy on a daily basis but despite that she still has increased fatigue and tiredness.  She denies any hematochezia or melena.        Medications: Unchanged on review. Current Outpatient Medications  Medication Sig Dispense Refill  . albuterol (VENTOLIN HFA) 108 (90 Base) MCG/ACT inhaler INHALE 1 TO 2 PUFFS BY MOUTH INTO THE LUNGS EVERY 6 HOURS AS NEEDED FOR WHEEZING OR SHORTNESS OF BREATH 18 g 5  . b complex vitamins tablet Take 1 tablet by mouth daily. 100 tablet 3  . budesonide-formoterol (SYMBICORT) 160-4.5 MCG/ACT inhaler Inhale 2 puffs into the lungs 2 (two) times daily. 3 Inhaler 3  . cetirizine (ZYRTEC) 10 MG tablet Take 1 tablet (10 mg total) by mouth daily. 90 tablet 3  . Cholecalciferol (VITAMIN D3) 1.25 MG (50000 UT) CAPS Take 1 capsule by mouth once a week. 6 capsule 0  . Cholecalciferol (VITAMIN D3) 2000 units capsule Take 1 capsule (2,000 Units total) by mouth daily. 100 capsule 3  . pseudoephedrine (SUDAFED) 120 MG 12 hr tablet Take 1 tablet (120 mg total) by mouth 2 (two) times daily as needed for congestion. 60 tablet 1   No current facility-administered medications for this visit.     Allergies:  Allergies  Allergen Reactions  . Augmentin [Amoxicillin-Pot Clavulanate] Rash  .  Cephalexin Rash  . Iodine Swelling and Rash    Throat swelling after eating shrimp        Physical Exam: Blood pressure 119/66, pulse 83, temperature 98.7 F (37.1 C), temperature source Tympanic, resp. rate 18, height 5\' 3"  (1.6 m), weight 239 lb 1.6 oz (108.5 kg), SpO2 100 %.   ECOG: 0     General appearance: Alert, awake without any distress. Head: Atraumatic without abnormalities Oropharynx: Without any thrush or ulcers. Eyes: No scleral icterus. Lymph nodes: No lymphadenopathy noted in the cervical, supraclavicular, or axillary nodes Heart:regular rate and rhythm, without any murmurs or gallops.   Lung: Clear to auscultation without any rhonchi, wheezes or dullness to percussion. Abdomin: Soft, nontender without any shifting dullness or ascites. Musculoskeletal: No clubbing or cyanosis. Neurological: No motor or sensory deficits. Skin: No rashes or lesions.     Lab Results: Lab Results  Component Value Date   WBC 6.2 12/15/2019   HGB 10.3 (L) 12/15/2019   HCT 32.5 (L) 12/15/2019   MCV 76.4 (L) 12/15/2019   PLT 262.0 12/15/2019     Chemistry      Component Value Date/Time   NA 136 12/15/2019 1014   NA 141 08/26/2016 1145   K 4.4 12/15/2019 1014   CL 103 12/15/2019 1014   CO2 27 12/15/2019 1014   BUN 15 12/15/2019 1014   BUN 13 08/26/2016 1145   CREATININE 0.76 12/15/2019 1014   CREATININE 0.81 12/28/2017 1114   CREATININE 0.74 05/13/2013 1024  Component Value Date/Time   CALCIUM 9.2 12/15/2019 1014   ALKPHOS 60 12/15/2019 1014   AST 18 12/15/2019 1014   AST 25 12/28/2017 1114   ALT 15 12/15/2019 1014   ALT 21 12/28/2017 1114   BILITOT 0.3 12/15/2019 1014   BILITOT 0.3 12/28/2017 1114       Impression and Plan:   39 year old woman with:   1.  Anemia related to iron deficiency or chronic menstrual blood losses noted in 2019.  He is status post IV iron infusion in the past.  Laboratory data in the last year noted that showed a drop in  her hemoglobin down to 10.3 from normal range back in June 2020.  Hemoglobin from today has declined as well currently at 10.1 with MCV of 75.8.  Iron studies repeated today and currently pending.  Risks and benefits of repeat intravenous iron were discussed versus continuing oral iron therapy.  Complication associated with IV iron including pathologies, myalgias and infusion related complications were discussed.  After discussion today, she is agreeable to proceed with IV iron.  We will schedule Feraheme infusion in the near future.    2.  Heavy menstrual cycles: Continues to be an issue and urged her to follow-up with gynecology regarding this complaint.  3.  Follow-up: In 6 months for repeat follow-up.  She will have IV iron infusion in the near future.  30  minutes were dedicated to this visit. The time was spent on reviewing laboratory data, discussing treatment options, and answering questions regarding future plan.     Zola Button, MD 9/15/202110:09 AM

## 2020-05-24 ENCOUNTER — Other Ambulatory Visit: Payer: Self-pay | Admitting: Oncology

## 2020-05-29 ENCOUNTER — Telehealth: Payer: Self-pay | Admitting: Oncology

## 2020-05-29 NOTE — Telephone Encounter (Signed)
Scheduled per 09/16 los, patient has been called and voicemail was left.

## 2020-05-30 ENCOUNTER — Telehealth: Payer: Self-pay | Admitting: Hematology

## 2020-05-30 NOTE — Telephone Encounter (Signed)
Rescheduled per charge nurse request, patient has been called and notified.

## 2020-05-31 ENCOUNTER — Inpatient Hospital Stay: Payer: No Typology Code available for payment source

## 2020-05-31 ENCOUNTER — Other Ambulatory Visit: Payer: Self-pay

## 2020-05-31 VITALS — BP 116/78 | HR 74 | Temp 98.6°F | Resp 18

## 2020-05-31 DIAGNOSIS — D500 Iron deficiency anemia secondary to blood loss (chronic): Secondary | ICD-10-CM

## 2020-05-31 MED ORDER — SODIUM CHLORIDE 0.9 % IV SOLN
125.0000 mg | Freq: Once | INTRAVENOUS | Status: AC
  Administered 2020-05-31: 125 mg via INTRAVENOUS
  Filled 2020-05-31: qty 10

## 2020-05-31 MED ORDER — SODIUM CHLORIDE 0.9 % IV SOLN
Freq: Once | INTRAVENOUS | Status: AC
  Filled 2020-05-31: qty 250

## 2020-05-31 NOTE — Progress Notes (Signed)
Pt remained for entire post-infusion observation period of 30 min.  Tolerated iron infusion well.  VS stable.

## 2020-05-31 NOTE — Patient Instructions (Signed)
Sodium Ferric Gluconate Complex injection What is this medicine? SODIUM FERRIC GLUCONATE COMPLEX (SOE dee um FER ik GLOO koe nate KOM pleks) is an iron replacement. It is used with epoetin therapy to treat low iron levels in patients who are receiving hemodialysis. This medicine may be used for other purposes; ask your health care provider or pharmacist if you have questions. COMMON BRAND NAME(S): Ferrlecit, Nulecit What should I tell my health care provider before I take this medicine? They need to know if you have any of the following conditions:  anemia that is not from iron deficiency  high levels of iron in the body  an unusual or allergic reaction to iron, benzyl alcohol, other medicines, foods, dyes, or preservatives  pregnant or are trying to become pregnant  breast-feeding How should I use this medicine? This medicine is for infusion into a vein. It is given by a health care professional in a hospital or clinic setting. Talk to your pediatrician regarding the use of this medicine in children. While this drug may be prescribed for children as young as 6 years old for selected conditions, precautions do apply. Overdosage: If you think you have taken too much of this medicine contact a poison control center or emergency room at once. NOTE: This medicine is only for you. Do not share this medicine with others. What if I miss a dose? It is important not to miss your dose. Call your doctor or health care professional if you are unable to keep an appointment. What may interact with this medicine? Do not take this medicine with any of the following medications:  deferoxamine  dimercaprol  other iron products This medicine may also interact with the following medications:  chloramphenicol  deferasirox  medicine for blood pressure like enalapril This list may not describe all possible interactions. Give your health care provider a list of all the medicines, herbs,  non-prescription drugs, or dietary supplements you use. Also tell them if you smoke, drink alcohol, or use illegal drugs. Some items may interact with your medicine. What should I watch for while using this medicine? Your condition will be monitored carefully while you are receiving this medicine. Visit your doctor for check-ups as directed. What side effects may I notice from receiving this medicine? Side effects that you should report to your doctor or health care professional as soon as possible:  allergic reactions like skin rash, itching or hives, swelling of the face, lips, or tongue  breathing problems  changes in hearing  changes in vision  chills, flushing, or sweating  fast, irregular heartbeat  feeling faint or lightheaded, falls  fever, flu-like symptoms  high or low blood pressure  pain, tingling, numbness in the hands or feet  severe pain in the chest, back, flanks, or groin  swelling of the ankles, feet, hands  trouble passing urine or change in the amount of urine  unusually weak or tired Side effects that usually do not require medical attention (report to your doctor or health care professional if they continue or are bothersome):  cramps  dark colored stools  diarrhea  headache  nausea, vomiting  stomach upset This list may not describe all possible side effects. Call your doctor for medical advice about side effects. You may report side effects to FDA at 1-800-FDA-1088. Where should I keep my medicine? This drug is given in a hospital or clinic and will not be stored at home. NOTE: This sheet is a summary. It may not cover all   possible information. If you have questions about this medicine, talk to your doctor, pharmacist, or health care provider.  2020 Elsevier/Gold Standard (2008-04-26 15:58:57)  

## 2020-06-07 ENCOUNTER — Inpatient Hospital Stay: Payer: No Typology Code available for payment source

## 2020-06-07 ENCOUNTER — Other Ambulatory Visit: Payer: Self-pay

## 2020-06-07 VITALS — BP 100/45 | HR 72 | Temp 98.2°F | Resp 17

## 2020-06-07 DIAGNOSIS — D500 Iron deficiency anemia secondary to blood loss (chronic): Secondary | ICD-10-CM | POA: Diagnosis not present

## 2020-06-07 MED ORDER — SODIUM CHLORIDE 0.9 % IV SOLN
Freq: Once | INTRAVENOUS | Status: AC
  Filled 2020-06-07: qty 250

## 2020-06-07 MED ORDER — SODIUM CHLORIDE 0.9 % IV SOLN
125.0000 mg | Freq: Once | INTRAVENOUS | Status: AC
  Administered 2020-06-07: 125 mg via INTRAVENOUS
  Filled 2020-06-07: qty 10

## 2020-06-07 NOTE — Patient Instructions (Signed)
Sodium Ferric Gluconate Complex injection What is this medicine? SODIUM FERRIC GLUCONATE COMPLEX (SOE dee um FER ik GLOO koe nate KOM pleks) is an iron replacement. It is used with epoetin therapy to treat low iron levels in patients who are receiving hemodialysis. This medicine may be used for other purposes; ask your health care provider or pharmacist if you have questions. COMMON BRAND NAME(S): Ferrlecit, Nulecit What should I tell my health care provider before I take this medicine? They need to know if you have any of the following conditions:  anemia that is not from iron deficiency  high levels of iron in the body  an unusual or allergic reaction to iron, benzyl alcohol, other medicines, foods, dyes, or preservatives  pregnant or are trying to become pregnant  breast-feeding How should I use this medicine? This medicine is for infusion into a vein. It is given by a health care professional in a hospital or clinic setting. Talk to your pediatrician regarding the use of this medicine in children. While this drug may be prescribed for children as young as 6 years old for selected conditions, precautions do apply. Overdosage: If you think you have taken too much of this medicine contact a poison control center or emergency room at once. NOTE: This medicine is only for you. Do not share this medicine with others. What if I miss a dose? It is important not to miss your dose. Call your doctor or health care professional if you are unable to keep an appointment. What may interact with this medicine? Do not take this medicine with any of the following medications:  deferoxamine  dimercaprol  other iron products This medicine may also interact with the following medications:  chloramphenicol  deferasirox  medicine for blood pressure like enalapril This list may not describe all possible interactions. Give your health care provider a list of all the medicines, herbs,  non-prescription drugs, or dietary supplements you use. Also tell them if you smoke, drink alcohol, or use illegal drugs. Some items may interact with your medicine. What should I watch for while using this medicine? Your condition will be monitored carefully while you are receiving this medicine. Visit your doctor for check-ups as directed. What side effects may I notice from receiving this medicine? Side effects that you should report to your doctor or health care professional as soon as possible:  allergic reactions like skin rash, itching or hives, swelling of the face, lips, or tongue  breathing problems  changes in hearing  changes in vision  chills, flushing, or sweating  fast, irregular heartbeat  feeling faint or lightheaded, falls  fever, flu-like symptoms  high or low blood pressure  pain, tingling, numbness in the hands or feet  severe pain in the chest, back, flanks, or groin  swelling of the ankles, feet, hands  trouble passing urine or change in the amount of urine  unusually weak or tired Side effects that usually do not require medical attention (report to your doctor or health care professional if they continue or are bothersome):  cramps  dark colored stools  diarrhea  headache  nausea, vomiting  stomach upset This list may not describe all possible side effects. Call your doctor for medical advice about side effects. You may report side effects to FDA at 1-800-FDA-1088. Where should I keep my medicine? This drug is given in a hospital or clinic and will not be stored at home. NOTE: This sheet is a summary. It may not cover all   possible information. If you have questions about this medicine, talk to your doctor, pharmacist, or health care provider.  2020 Elsevier/Gold Standard (2008-04-26 15:58:57)  

## 2020-06-14 ENCOUNTER — Inpatient Hospital Stay: Payer: No Typology Code available for payment source | Attending: Oncology

## 2020-06-14 ENCOUNTER — Ambulatory Visit: Payer: No Typology Code available for payment source

## 2020-06-14 ENCOUNTER — Other Ambulatory Visit: Payer: Self-pay

## 2020-06-14 VITALS — BP 109/67 | HR 62 | Temp 99.0°F | Resp 18

## 2020-06-14 DIAGNOSIS — D500 Iron deficiency anemia secondary to blood loss (chronic): Secondary | ICD-10-CM | POA: Diagnosis present

## 2020-06-14 DIAGNOSIS — N920 Excessive and frequent menstruation with regular cycle: Secondary | ICD-10-CM | POA: Insufficient documentation

## 2020-06-14 MED ORDER — SODIUM CHLORIDE 0.9 % IV SOLN
125.0000 mg | Freq: Once | INTRAVENOUS | Status: AC
  Administered 2020-06-14: 125 mg via INTRAVENOUS
  Filled 2020-06-14: qty 10

## 2020-06-14 MED ORDER — SODIUM CHLORIDE 0.9 % IV SOLN
Freq: Once | INTRAVENOUS | Status: AC
  Filled 2020-06-14: qty 250

## 2020-06-14 NOTE — Patient Instructions (Signed)
Sodium Ferric Gluconate Complex injection What is this medicine? SODIUM FERRIC GLUCONATE COMPLEX (SOE dee um FER ik GLOO koe nate KOM pleks) is an iron replacement. It is used with epoetin therapy to treat low iron levels in patients who are receiving hemodialysis. This medicine may be used for other purposes; ask your health care provider or pharmacist if you have questions. COMMON BRAND NAME(S): Ferrlecit, Nulecit What should I tell my health care provider before I take this medicine? They need to know if you have any of the following conditions:  anemia that is not from iron deficiency  high levels of iron in the body  an unusual or allergic reaction to iron, benzyl alcohol, other medicines, foods, dyes, or preservatives  pregnant or are trying to become pregnant  breast-feeding How should I use this medicine? This medicine is for infusion into a vein. It is given by a health care professional in a hospital or clinic setting. Talk to your pediatrician regarding the use of this medicine in children. While this drug may be prescribed for children as young as 6 years old for selected conditions, precautions do apply. Overdosage: If you think you have taken too much of this medicine contact a poison control center or emergency room at once. NOTE: This medicine is only for you. Do not share this medicine with others. What if I miss a dose? It is important not to miss your dose. Call your doctor or health care professional if you are unable to keep an appointment. What may interact with this medicine? Do not take this medicine with any of the following medications:  deferoxamine  dimercaprol  other iron products This medicine may also interact with the following medications:  chloramphenicol  deferasirox  medicine for blood pressure like enalapril This list may not describe all possible interactions. Give your health care provider a list of all the medicines, herbs,  non-prescription drugs, or dietary supplements you use. Also tell them if you smoke, drink alcohol, or use illegal drugs. Some items may interact with your medicine. What should I watch for while using this medicine? Your condition will be monitored carefully while you are receiving this medicine. Visit your doctor for check-ups as directed. What side effects may I notice from receiving this medicine? Side effects that you should report to your doctor or health care professional as soon as possible:  allergic reactions like skin rash, itching or hives, swelling of the face, lips, or tongue  breathing problems  changes in hearing  changes in vision  chills, flushing, or sweating  fast, irregular heartbeat  feeling faint or lightheaded, falls  fever, flu-like symptoms  high or low blood pressure  pain, tingling, numbness in the hands or feet  severe pain in the chest, back, flanks, or groin  swelling of the ankles, feet, hands  trouble passing urine or change in the amount of urine  unusually weak or tired Side effects that usually do not require medical attention (report to your doctor or health care professional if they continue or are bothersome):  cramps  dark colored stools  diarrhea  headache  nausea, vomiting  stomach upset This list may not describe all possible side effects. Call your doctor for medical advice about side effects. You may report side effects to FDA at 1-800-FDA-1088. Where should I keep my medicine? This drug is given in a hospital or clinic and will not be stored at home. NOTE: This sheet is a summary. It may not cover all   possible information. If you have questions about this medicine, talk to your doctor, pharmacist, or health care provider.  2020 Elsevier/Gold Standard (2008-04-26 15:58:57)  

## 2020-06-17 IMAGING — DX DG CHEST 2V
2 series · 2 of 2 positions shown · non-contrast
Comparison: PA and lateral chest x-ray October 10, 2015

CLINICAL DATA: Right anterior shoulder and clavicular pain and
swelling for the past 2 days. No known injury. No chest complaints.
History of asthma.

EXAM:
CHEST - 2 VIEW

[chest pa]
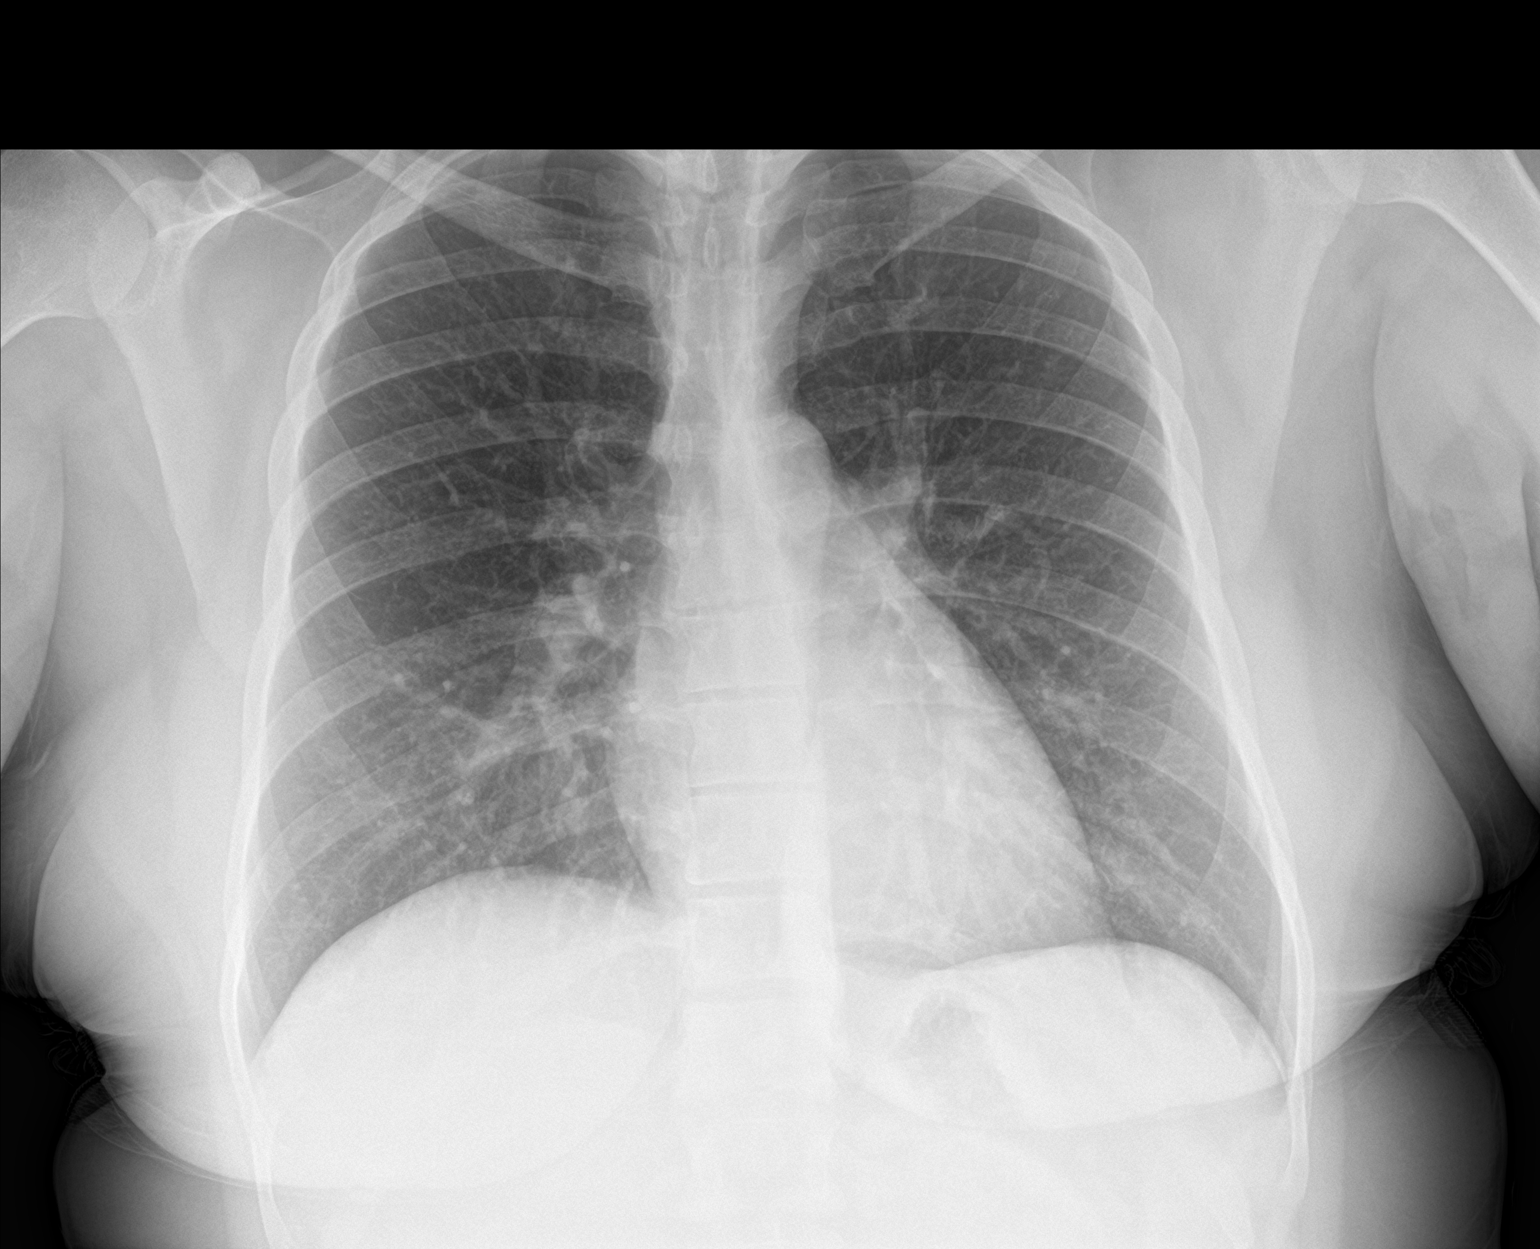

[chest lat]
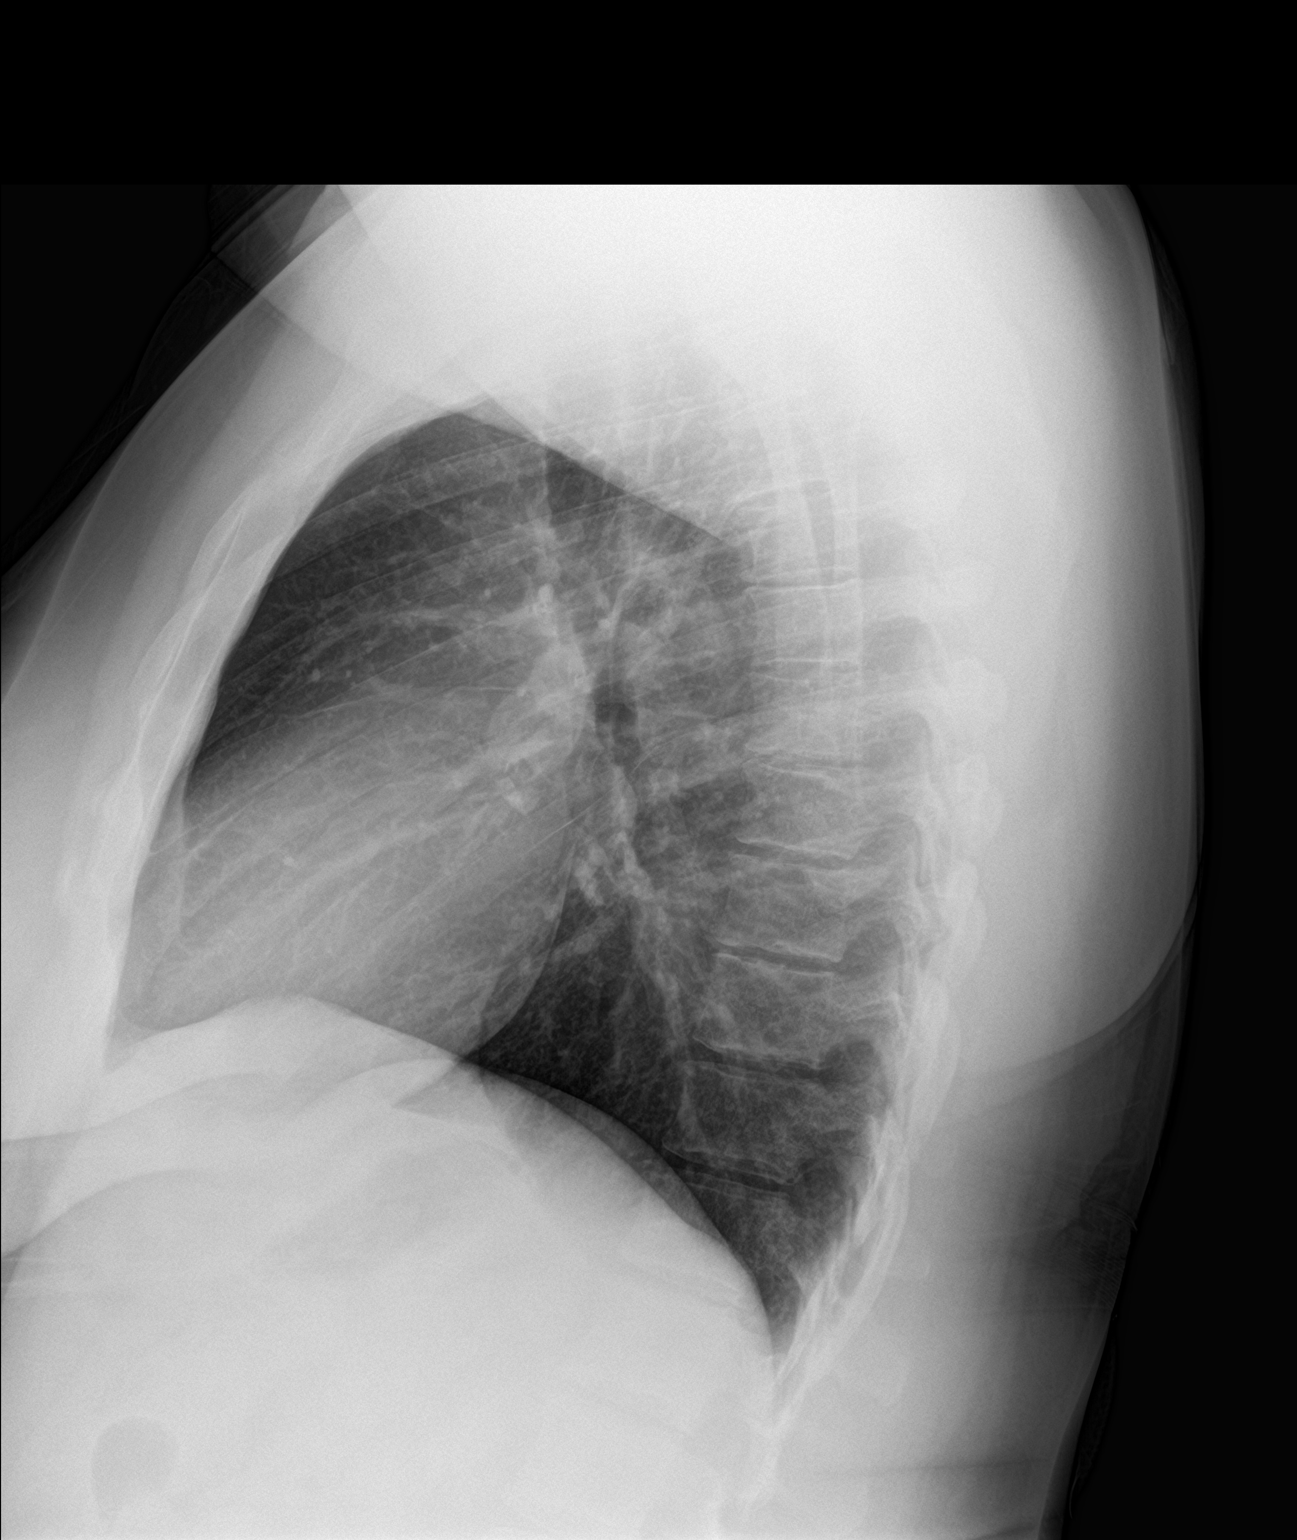

[2 of 2 positions shown; findings below may reference images not displayed]

FINDINGS: The lungs are well-expanded. There is no focal infiltrate. The
interstitial markings are mildly prominent consistent with the
history of reactive airway disease. There is no pleural effusion.
The heart and mediastinal structures are normal. The right clavicle
and observed portions of the right scapula and shoulder reveal no
acute or chronic bony abnormalities. The thoracic vertebral bodies
are preserved in height where visualized.
IMPRESSION: No acute abnormality of the right clavicle or visualized portions of
the scapula are observed.

There is no acute cardiopulmonary abnormality.

## 2020-06-21 ENCOUNTER — Inpatient Hospital Stay: Payer: No Typology Code available for payment source

## 2020-06-21 ENCOUNTER — Ambulatory Visit: Payer: No Typology Code available for payment source

## 2020-06-23 ENCOUNTER — Other Ambulatory Visit: Payer: Self-pay

## 2020-06-23 ENCOUNTER — Ambulatory Visit: Payer: No Typology Code available for payment source | Attending: Internal Medicine

## 2020-06-23 DIAGNOSIS — Z23 Encounter for immunization: Secondary | ICD-10-CM

## 2020-06-23 NOTE — Progress Notes (Signed)
   Covid-19 Vaccination Clinic  Name:  Trinette Vera    MRN: [id number] DOB: Jun 07, 1981  06/23/2020  Ms. Sedlar was observed post Covid-19 immunization for 15 minutes without incident. She was provided with Vaccine Information Sheet and instruction to access the V-Safe system.   Ms. Fetterolf was instructed to call 911 with any severe reactions post vaccine: [name] Difficulty breathing  . Swelling of face and throat  . A fast heartbeat  . A bad rash all over body  . Dizziness and weakness

## 2020-06-27 ENCOUNTER — Telehealth: Payer: Self-pay | Admitting: Internal Medicine

## 2020-06-27 DIAGNOSIS — E538 Deficiency of other specified B group vitamins: Secondary | ICD-10-CM

## 2020-06-27 DIAGNOSIS — R040 Epistaxis: Secondary | ICD-10-CM

## 2020-06-27 DIAGNOSIS — E559 Vitamin D deficiency, unspecified: Secondary | ICD-10-CM

## 2020-06-27 NOTE — Telephone Encounter (Signed)
    Patient calling to report recent episodes of nose bleeding. She wants to know if she needs any type of labs, prior to 11/2 appointment

## 2020-06-28 NOTE — Telephone Encounter (Signed)
CBC, INR, Vit D, Vit B12 Thx

## 2020-07-02 NOTE — Telephone Encounter (Signed)
We need Dx for INR & CBC.Marland KitchenJohny Schwartz

## 2020-07-03 NOTE — Telephone Encounter (Signed)
Nosebleed/epistaxis.  Done.   Thanks

## 2020-07-03 NOTE — Telephone Encounter (Signed)
Notified pt MD ordered labs. Can have done at [facility] lab.Marland KitchenLind Schwartz

## 2020-07-09 ENCOUNTER — Other Ambulatory Visit (INDEPENDENT_AMBULATORY_CARE_PROVIDER_SITE_OTHER): Payer: No Typology Code available for payment source

## 2020-07-09 DIAGNOSIS — R040 Epistaxis: Secondary | ICD-10-CM | POA: Diagnosis not present

## 2020-07-09 DIAGNOSIS — E559 Vitamin D deficiency, unspecified: Secondary | ICD-10-CM | POA: Diagnosis not present

## 2020-07-09 LAB — CBC WITH DIFFERENTIAL/PLATELET
Basophils Absolute: 0.1 10*3/uL (ref 0.0–0.1)
Basophils Relative: 0.9 % (ref 0.0–3.0)
Eosinophils Absolute: 0.4 10*3/uL (ref 0.0–0.7)
Eosinophils Relative: 6.9 % — ABNORMAL HIGH (ref 0.0–5.0)
HCT: 34.3 % — ABNORMAL LOW (ref 36.0–46.0)
Hemoglobin: 11.1 g/dL — ABNORMAL LOW (ref 12.0–15.0)
Lymphocytes Relative: 35.1 % (ref 12.0–46.0)
Lymphs Abs: 2.2 10*3/uL (ref 0.7–4.0)
MCHC: 32.5 g/dL (ref 30.0–36.0)
MCV: 77 fl — ABNORMAL LOW (ref 78.0–100.0)
Monocytes Absolute: 0.4 10*3/uL (ref 0.1–1.0)
Monocytes Relative: 6.7 % (ref 3.0–12.0)
Neutro Abs: 3.2 10*3/uL (ref 1.4–7.7)
Neutrophils Relative %: 50.4 % (ref 43.0–77.0)
Platelets: 246 10*3/uL (ref 150.0–400.0)
RBC: 4.46 Mil/uL (ref 3.87–5.11)
RDW: 20.4 % — ABNORMAL HIGH (ref 11.5–15.5)
WBC: 6.4 10*3/uL (ref 4.0–10.5)

## 2020-07-09 LAB — PROTIME-INR
INR: 1 ratio (ref 0.8–1.0)
Prothrombin Time: 11.5 s (ref 9.6–13.1)

## 2020-07-09 LAB — VITAMIN D 25 HYDROXY (VIT D DEFICIENCY, FRACTURES): VITD: 30.87 ng/mL (ref 30.00–100.00)

## 2020-07-10 ENCOUNTER — Ambulatory Visit (INDEPENDENT_AMBULATORY_CARE_PROVIDER_SITE_OTHER): Payer: No Typology Code available for payment source | Admitting: Internal Medicine

## 2020-07-10 ENCOUNTER — Encounter: Payer: Self-pay | Admitting: Internal Medicine

## 2020-07-10 ENCOUNTER — Other Ambulatory Visit: Payer: Self-pay | Admitting: Internal Medicine

## 2020-07-10 ENCOUNTER — Other Ambulatory Visit: Payer: Self-pay

## 2020-07-10 DIAGNOSIS — E538 Deficiency of other specified B group vitamins: Secondary | ICD-10-CM | POA: Diagnosis not present

## 2020-07-10 DIAGNOSIS — R040 Epistaxis: Secondary | ICD-10-CM

## 2020-07-10 DIAGNOSIS — Z23 Encounter for immunization: Secondary | ICD-10-CM | POA: Diagnosis not present

## 2020-07-10 DIAGNOSIS — E559 Vitamin D deficiency, unspecified: Secondary | ICD-10-CM

## 2020-07-10 DIAGNOSIS — J301 Allergic rhinitis due to pollen: Secondary | ICD-10-CM | POA: Diagnosis not present

## 2020-07-10 DIAGNOSIS — D508 Other iron deficiency anemias: Secondary | ICD-10-CM

## 2020-07-10 MED ORDER — FLUTICASONE PROPIONATE 50 MCG/ACT NA SUSP: 2.0000 | Freq: Every day | NASAL | 6 refills | Status: DC

## 2020-07-10 MED ORDER — CYANOCOBALAMIN 1000 MCG/ML IJ SOLN: 1000.0000 ug | INTRAMUSCULAR | 11 refills | Status: DC

## 2020-07-10 MED ORDER — CYANOCOBALAMIN 1000 MCG/ML IJ SOLN
1000.0000 ug | Freq: Once | INTRAMUSCULAR | Status: AC
  Administered 2020-07-10: 1000 ug via INTRAMUSCULAR

## 2020-07-10 MED ORDER — VITAMIN D3 50 MCG (2000 UT) PO CAPS: 4000.0000 [IU] | ORAL_CAPSULE | Freq: Every day | ORAL | 3 refills | Status: DC

## 2020-07-10 MED FILL — FLUTICASONE PROP 50 MCG SPR: 50 | 30 days supply | Qty: 16 | Fill #0

## 2020-07-10 MED FILL — CYANOCOBALAMIN 1,000 MCG/ML: 1000 | 84 days supply | Qty: 6 | Fill #0

## 2020-07-10 NOTE — Assessment & Plan Note (Signed)
Re-start Vit B12 Risks associated with treatment noncompliance were discussed. Compliance was encouraged.

## 2020-07-10 NOTE — Assessment & Plan Note (Signed)
Increase Vit D to 4000 iu/d

## 2020-07-10 NOTE — Assessment & Plan Note (Signed)
Add Flonase Zyrtec

## 2020-07-10 NOTE — Assessment & Plan Note (Signed)
ENT ref - Dr Benjamine Mola

## 2020-07-10 NOTE — Progress Notes (Signed)
Subjective:  Patient ID: Deanna Schwartz, female    DOB: August 24, 1981  Age: 39 y.o. MRN: [id number]  CC: Epistaxis   HPI [name] presents for R nose bleeds x 2 mo off and on F/u anemia, fatigue and Vit D def   Outpatient Medications Prior to Visit  Medication Sig Dispense Refill  . albuterol (VENTOLIN HFA) 108 (90 Base) MCG/ACT inhaler INHALE 1 TO 2 PUFFS BY MOUTH INTO THE LUNGS EVERY 6 HOURS AS NEEDED FOR WHEEZING OR SHORTNESS OF BREATH 18 g 5  . cetirizine (ZYRTEC) 10 MG tablet Take 1 tablet (10 mg total) by mouth daily. 90 tablet 3  . Cholecalciferol (VITAMIN D3) 2000 units capsule Take 1 capsule (2,000 Units total) by mouth daily. 100 capsule 3  . pseudoephedrine (SUDAFED) 120 MG 12 hr tablet Take 1 tablet (120 mg total) by mouth 2 (two) times daily as needed for congestion. 60 tablet 1  . b complex vitamins tablet Take 1 tablet by mouth daily. (Patient not taking: Reported on 07/10/2020) 100 tablet 3  . budesonide-formoterol (SYMBICORT) 160-4.5 MCG/ACT inhaler Inhale 2 puffs into the lungs 2 (two) times daily. 3 Inhaler 3  . Cholecalciferol (VITAMIN D3) 1.25 MG (50000 UT) CAPS Take 1 capsule by mouth once a week. (Patient not taking: Reported on 07/10/2020) 6 capsule 0   No facility-administered medications prior to visit.    ROS: Review of Systems  Constitutional: Positive for fatigue. Negative for activity change, appetite change, chills and unexpected weight change.  HENT: Positive for congestion, nosebleeds and rhinorrhea. Negative for mouth sores and sinus pressure.   Eyes: Negative for visual disturbance.  Respiratory: Negative for cough and chest tightness.   Gastrointestinal: Negative for abdominal pain and nausea.  Genitourinary: Negative for difficulty urinating, frequency and vaginal pain.  Musculoskeletal: Negative for back pain and gait problem.  Skin: Negative for pallor and rash.  Neurological: Negative for dizziness, tremors, weakness, numbness and  headaches.  Psychiatric/Behavioral: Negative for confusion and sleep disturbance.    Objective:  BP 128/82 (BP Location: Left Arm)   Pulse 75   Temp 98.4 F (36.9 C) (Oral)   Wt 240 lb 3.2 oz (109 kg)   SpO2 98%   BMI 42.55 kg/m   BP Readings from Last 3 Encounters:  07/10/20 128/82  06/14/20 109/67  06/07/20 (!) 100/45    Wt Readings from Last 3 Encounters:  07/10/20 240 lb 3.2 oz (109 kg)  05/23/20 239 lb 1.6 oz (108.5 kg)  12/15/19 231 lb (104.8 kg)    Physical Exam Constitutional:      General: She is not in acute distress.    Appearance: She is well-developed. She is obese.  HENT:     Head: Normocephalic.     Right Ear: External ear normal.     Left Ear: External ear normal.     Nose: Nose normal.  Eyes:     General:        Right eye: No discharge.        Left eye: No discharge.     Conjunctiva/sclera: Conjunctivae normal.     Pupils: Pupils are equal, round, and reactive to light.  Neck:     Thyroid: No thyromegaly.     Vascular: No JVD.     Trachea: No tracheal deviation.  Cardiovascular:     Rate and Rhythm: Normal rate and regular rhythm.     Heart sounds: Normal heart sounds.  Pulmonary:     Effort: No respiratory distress.  Breath sounds: No stridor. No wheezing.  Abdominal:     General: Bowel sounds are normal. There is no distension.     Palpations: Abdomen is soft. There is no mass.     Tenderness: There is no abdominal tenderness. There is no guarding or rebound.  Musculoskeletal:        General: No tenderness.     Cervical back: Normal range of motion and neck supple.  Lymphadenopathy:     Cervical: No cervical adenopathy.  Skin:    Findings: No erythema or rash.  Neurological:     Cranial Nerves: No cranial nerve deficit.     Motor: No abnormal muscle tone.     Coordination: Coordination normal.     Deep Tendon Reflexes: Reflexes normal.  Psychiatric:        Behavior: Behavior normal.        Thought Content: Thought content  normal.        Judgment: Judgment normal.    Swollen nasal mucosa No blood in nares   Lab Results  Component Value Date   WBC 6.4 07/09/2020   HGB 11.1 (L) 07/09/2020   HCT 34.3 (L) 07/09/2020   PLT 246.0 07/09/2020   GLUCOSE 89 12/15/2019   CHOL 175 12/15/2019   TRIG 119.0 12/15/2019   HDL 53.70 12/15/2019   LDLDIRECT 86.0 10/13/2017   LDLCALC 97 12/15/2019   ALT 15 12/15/2019   AST 18 12/15/2019   NA 136 12/15/2019   K 4.4 12/15/2019   CL 103 12/15/2019   CREATININE 0.76 12/15/2019   BUN 15 12/15/2019   CO2 27 12/15/2019   TSH 1.82 12/15/2019   INR 1.0 07/09/2020   HGBA1C 5.7 12/16/2016    DG Chest 2 View  Result Date: 10/14/2019 CLINICAL DATA:  Cough EXAM: CHEST - 2 VIEW COMPARISON:  07/19/2018 FINDINGS: The heart size and mediastinal contours are within normal limits. Both lungs are clear. The visualized skeletal structures are unremarkable. IMPRESSION: No acute abnormality of the lungs. Electronically Signed   By: Eddie Candle M.D.   On: 10/14/2019 14:11    Assessment & Plan:   There are no diagnoses linked to this encounter.   No orders of the defined types were placed in this encounter.    Follow-up: No follow-ups on file.  [name], MD

## 2020-08-24 ENCOUNTER — Other Ambulatory Visit: Payer: Self-pay | Admitting: Internal Medicine

## 2020-08-24 DIAGNOSIS — R739 Hyperglycemia, unspecified: Secondary | ICD-10-CM

## 2020-08-24 DIAGNOSIS — R1011 Right upper quadrant pain: Secondary | ICD-10-CM

## 2020-09-17 ENCOUNTER — Other Ambulatory Visit (INDEPENDENT_AMBULATORY_CARE_PROVIDER_SITE_OTHER): Payer: No Typology Code available for payment source

## 2020-09-17 DIAGNOSIS — R739 Hyperglycemia, unspecified: Secondary | ICD-10-CM | POA: Diagnosis not present

## 2020-09-17 DIAGNOSIS — R1011 Right upper quadrant pain: Secondary | ICD-10-CM | POA: Diagnosis not present

## 2020-09-17 LAB — COMPREHENSIVE METABOLIC PANEL
ALT: 18 U/L (ref 0–35)
AST: 18 U/L (ref 0–37)
Albumin: 4 g/dL (ref 3.5–5.2)
Alkaline Phosphatase: 53 U/L (ref 39–117)
BUN: 14 mg/dL (ref 6–23)
CO2: 29 mEq/L (ref 19–32)
Calcium: 9.3 mg/dL (ref 8.4–10.5)
Chloride: 102 mEq/L (ref 96–112)
Creatinine, Ser: 0.83 mg/dL (ref 0.40–1.20)
GFR: 89.32 mL/min (ref >60.00)
Glucose, Bld: 90 mg/dL (ref 70–99)
Potassium: 3.8 mEq/L (ref 3.5–5.1)
Sodium: 137 mEq/L (ref 135–145)
Total Bilirubin: 0.4 mg/dL (ref 0.2–1.2)
Total Protein: 7.4 g/dL (ref 6.0–8.3)

## 2020-09-17 LAB — HEMOGLOBIN A1C: Hgb A1c MFr Bld: 5.7 % (ref 4.6–6.5)

## 2020-09-18 ENCOUNTER — Ambulatory Visit
Admission: RE | Admit: 2020-09-18 | Discharge: 2020-09-18 | Disposition: A | Discharge status: Home / Self Care | Payer: No Typology Code available for payment source | Source: Ambulatory Visit | Attending: Internal Medicine | Admitting: Internal Medicine

## 2020-09-18 DIAGNOSIS — R1011 Right upper quadrant pain: Secondary | ICD-10-CM

## 2020-10-09 MED FILL — ALBUTEROL SULFATE HFA 108 (: 108 (90 BAS | 25 days supply | Qty: 18 | Fill #2

## 2020-12-24 ENCOUNTER — Other Ambulatory Visit: Payer: Self-pay

## 2020-12-25 ENCOUNTER — Telehealth (INDEPENDENT_AMBULATORY_CARE_PROVIDER_SITE_OTHER): Payer: No Typology Code available for payment source | Admitting: Internal Medicine

## 2020-12-25 ENCOUNTER — Encounter: Payer: Self-pay | Admitting: Internal Medicine

## 2020-12-25 ENCOUNTER — Encounter: Payer: No Typology Code available for payment source | Admitting: Internal Medicine

## 2020-12-25 ENCOUNTER — Other Ambulatory Visit (HOSPITAL_COMMUNITY): Payer: Self-pay

## 2020-12-25 DIAGNOSIS — J4520 Mild intermittent asthma, uncomplicated: Secondary | ICD-10-CM

## 2020-12-25 DIAGNOSIS — J209 Acute bronchitis, unspecified: Secondary | ICD-10-CM | POA: Insufficient documentation

## 2020-12-25 MED ORDER — FLUCONAZOLE 150 MG PO TABS
150.0000 mg | ORAL_TABLET | Freq: Once | ORAL | 1 refills | Status: AC
  Filled 2020-12-25 – 2021-01-04 (×2): qty 1, 1d supply, fill #0

## 2020-12-25 MED ORDER — AZITHROMYCIN 250 MG PO TABS
ORAL_TABLET | ORAL | 0 refills | Status: DC
  Filled 2020-12-25 – 2021-01-04 (×2): qty 6, 5d supply, fill #0

## 2020-12-25 NOTE — Assessment & Plan Note (Signed)
Prescribe Z-Pak.  Diflucan as needed yeast infection.  Continue with Mucinex.  Treat asthma and allergies

## 2020-12-25 NOTE — Assessment & Plan Note (Signed)
Use Zyrtec and albuterol MDI

## 2020-12-25 NOTE — Progress Notes (Signed)
Virtual Visit via Video Note  I connected with Deanna Schwartz on 12/25/20 at 10:00 AM EDT by a video enabled telemedicine application and verified that I am speaking with the correct person using two identifiers.   I discussed the limitations of evaluation and management by telemedicine and the availability of in person appointments. The patient expressed understanding and agreed to proceed.  I was located at our [facility] office. The patient was at home. There was no one else present in the visit.   History of Present Illness: The patient is complaining of several days of allergies/cold symptoms with stuffy nose, sore throat and cough.  Mucus that she brings up is very thick and yellow.  No fever.  She has used your inhaler a few times   Observations/Objective: The patient appears to be in no acute distress, looks tired  Assessment and Plan:  See my Assessment and Plan. Follow Up Instructions:    I discussed the assessment and treatment plan with the patient. The patient was provided an opportunity to ask questions and all were answered. The patient agreed with the plan and demonstrated an understanding of the instructions.   The patient was advised to call back or seek an in-person evaluation if the symptoms worsen or if the condition fails to improve as anticipated.  I provided face-to-face time during this encounter. We were at different locations.   [name], MD

## 2020-12-31 ENCOUNTER — Other Ambulatory Visit (HOSPITAL_COMMUNITY): Payer: Self-pay

## 2021-01-01 ENCOUNTER — Other Ambulatory Visit (HOSPITAL_COMMUNITY): Payer: Self-pay

## 2021-01-02 ENCOUNTER — Other Ambulatory Visit (HOSPITAL_COMMUNITY): Payer: Self-pay

## 2021-01-04 ENCOUNTER — Other Ambulatory Visit (HOSPITAL_COMMUNITY): Payer: Self-pay

## 2021-01-07 ENCOUNTER — Encounter: Payer: No Typology Code available for payment source | Admitting: Internal Medicine

## 2021-01-24 ENCOUNTER — Ambulatory Visit (INDEPENDENT_AMBULATORY_CARE_PROVIDER_SITE_OTHER): Payer: No Typology Code available for payment source | Admitting: Internal Medicine

## 2021-01-24 ENCOUNTER — Other Ambulatory Visit: Payer: Self-pay

## 2021-01-24 ENCOUNTER — Encounter: Payer: Self-pay | Admitting: Internal Medicine

## 2021-01-24 ENCOUNTER — Other Ambulatory Visit (HOSPITAL_COMMUNITY): Payer: Self-pay

## 2021-01-24 VITALS — BP 122/78 | HR 89 | Temp 98.7°F | Ht 63.0 in | Wt 243.6 lb

## 2021-01-24 DIAGNOSIS — E669 Obesity, unspecified: Secondary | ICD-10-CM | POA: Diagnosis not present

## 2021-01-24 DIAGNOSIS — D500 Iron deficiency anemia secondary to blood loss (chronic): Secondary | ICD-10-CM | POA: Diagnosis not present

## 2021-01-24 DIAGNOSIS — E538 Deficiency of other specified B group vitamins: Secondary | ICD-10-CM

## 2021-01-24 DIAGNOSIS — Z0000 Encounter for general adult medical examination without abnormal findings: Secondary | ICD-10-CM

## 2021-01-24 DIAGNOSIS — R739 Hyperglycemia, unspecified: Secondary | ICD-10-CM

## 2021-01-24 MED ORDER — ALBUTEROL SULFATE HFA 108 (90 BASE) MCG/ACT IN AERS
INHALATION_SPRAY | RESPIRATORY_TRACT | 5 refills | Status: DC
  Filled 2021-01-24: qty 18, 25d supply, fill #0
  Filled 2021-03-12: qty 18, 16d supply, fill #0
  Filled 2021-08-06: qty 18, 16d supply, fill #1

## 2021-01-24 NOTE — Assessment & Plan Note (Signed)

## 2021-01-24 NOTE — Progress Notes (Signed)
Subjective:  Patient ID: Deanna Schwartz, female    DOB: [birth year]  Age: 41 y.o. MRN: [id number]  CC: Annual Exam   HPI Deanna Schwartz presents for a well exam  Outpatient Medications Prior to Visit  Medication Sig Dispense Refill  . cetirizine (ZYRTEC) 10 MG tablet Take 1 tablet (10 mg total) by mouth daily. 90 tablet 3  . Cholecalciferol (VITAMIN D3) 50 MCG (2000 UT) capsule Take 2 capsules (4,000 Units total) by mouth daily. 200 capsule 3  . cyanocobalamin (,VITAMIN B-12,) 1000 MCG/ML injection INJECT 1 ML INTO THE MUSCLE EVERY 14 DAYS 10 mL 11  . albuterol (VENTOLIN HFA) 108 (90 Base) MCG/ACT inhaler INHALE 1 TO 2 PUFFS BY MOUTH INTO THE LUNGS EVERY 6 HOURS AS NEEDED FOR WHEEZING OR SHORTNESS OF BREATH 18 g 5  . azithromycin (ZITHROMAX Z-PAK) 250 MG tablet Use as directed per package instructions (Patient not taking: Reported on 01/24/2021) 6 tablet 0  . fluticasone (FLONASE) 50 MCG/ACT nasal spray INSTILL 2 SPRAYS INTO BOTH NOSTRILS DAILY (Patient not taking: Reported on 01/24/2021) 16 g 6   No facility-administered medications prior to visit.    ROS: Review of Systems  Constitutional: Positive for unexpected weight change. Negative for activity change, appetite change, chills and fatigue.  HENT: Negative for congestion, mouth sores and sinus pressure.   Eyes: Negative for visual disturbance.  Respiratory: Positive for wheezing. Negative for cough and chest tightness.   Gastrointestinal: Negative for abdominal pain and nausea.  Genitourinary: Negative for difficulty urinating, frequency and vaginal pain.  Musculoskeletal: Negative for back pain and gait problem.  Skin: Negative for pallor and rash.  Neurological: Negative for dizziness, tremors, weakness, numbness and headaches.  Psychiatric/Behavioral: Negative for confusion and sleep disturbance.    Objective:  BP 122/78 (BP Location: Left Arm)   Pulse 89   Temp 98.7 F (37.1 C) (Oral)   Ht 5\' 3"  (1.6 m)   Wt  243 lb 9.6 oz (110.5 kg)   SpO2 98%   BMI 43.15 kg/m   BP Readings from Last 3 Encounters:  01/24/21 122/78  07/10/20 128/82  06/14/20 109/67    Wt Readings from Last 3 Encounters:  01/24/21 243 lb 9.6 oz (110.5 kg)  07/10/20 240 lb 3.2 oz (109 kg)  05/23/20 239 lb 1.6 oz (108.5 kg)    Physical Exam Constitutional:      General: She is not in acute distress.    Appearance: She is well-developed. She is obese.  HENT:     Head: Normocephalic.     Right Ear: External ear normal.     Left Ear: External ear normal.     Nose: Nose normal.  Eyes:     General:        Right eye: No discharge.        Left eye: No discharge.     Conjunctiva/sclera: Conjunctivae normal.     Pupils: Pupils are equal, round, and reactive to light.  Neck:     Thyroid: No thyromegaly.     Vascular: No JVD.     Trachea: No tracheal deviation.  Cardiovascular:     Rate and Rhythm: Normal rate and regular rhythm.     Heart sounds: Normal heart sounds.  Pulmonary:     Effort: No respiratory distress.     Breath sounds: No stridor. No wheezing.  Abdominal:     General: Bowel sounds are normal. There is no distension.     Palpations: Abdomen is soft. There  is no mass.     Tenderness: There is no abdominal tenderness. There is no guarding or rebound.  Musculoskeletal:        General: No tenderness.     Cervical back: Normal range of motion and neck supple.  Lymphadenopathy:     Cervical: No cervical adenopathy.  Skin:    Findings: No erythema or rash.  Neurological:     Cranial Nerves: No cranial nerve deficit.     Motor: No abnormal muscle tone.     Coordination: Coordination normal.     Deep Tendon Reflexes: Reflexes normal.  Psychiatric:        Behavior: Behavior normal.        Thought Content: Thought content normal.        Judgment: Judgment normal.     Lab Results  Component Value Date   WBC 6.4 07/09/2020   HGB 11.1 (L) 07/09/2020   HCT 34.3 (L) 07/09/2020   PLT 246.0 07/09/2020    GLUCOSE 90 09/17/2020   CHOL 175 12/15/2019   TRIG 119.0 12/15/2019   HDL 53.70 12/15/2019   LDLDIRECT 86.0 10/13/2017   LDLCALC 97 12/15/2019   ALT 18 09/17/2020   AST 18 09/17/2020   NA 137 09/17/2020   K 3.8 09/17/2020   CL 102 09/17/2020   CREATININE 0.83 09/17/2020   BUN 14 09/17/2020   CO2 29 09/17/2020   TSH 1.82 12/15/2019   INR 1.0 07/09/2020   HGBA1C 5.7 09/17/2020    US Abdomen Limited RUQ (LIVER/GB)  Result Date: 09/18/2020 CLINICAL DATA:  Right upper quadrant abdominal pain. EXAM: ULTRASOUND ABDOMEN LIMITED RIGHT UPPER QUADRANT COMPARISON:  03/17/2016 and prior. FINDINGS: Gallbladder: No gallstones or wall thickening visualized. No sonographic Murphy sign noted by sonographer. Common bile duct: Diameter: 2.5 mm Liver: No focal lesion identified. Increased parenchymal echogenicity. Portal vein is patent on color Doppler imaging with normal direction of blood flow towards the liver. Other: None. IMPRESSION: Hepatic steatosis.  No focal hepatic lesion. Electronically Signed   By: Primitivo Gauze M.D.   On: 09/18/2020 11:08    Assessment & Plan:   There are no diagnoses linked to this encounter.   No orders of the defined types were placed in this encounter.    Follow-up: No follow-ups on file.  [name], MD

## 2021-01-25 ENCOUNTER — Other Ambulatory Visit (INDEPENDENT_AMBULATORY_CARE_PROVIDER_SITE_OTHER): Payer: No Typology Code available for payment source

## 2021-01-25 DIAGNOSIS — R739 Hyperglycemia, unspecified: Secondary | ICD-10-CM

## 2021-01-25 DIAGNOSIS — E538 Deficiency of other specified B group vitamins: Secondary | ICD-10-CM

## 2021-01-25 DIAGNOSIS — D500 Iron deficiency anemia secondary to blood loss (chronic): Secondary | ICD-10-CM | POA: Diagnosis not present

## 2021-01-25 DIAGNOSIS — Z0000 Encounter for general adult medical examination without abnormal findings: Secondary | ICD-10-CM

## 2021-01-25 LAB — CBC WITH DIFFERENTIAL/PLATELET
Basophils Absolute: 0 10*3/uL (ref 0.0–0.1)
Basophils Relative: 0.7 % (ref 0.0–3.0)
Eosinophils Absolute: 0.3 10*3/uL (ref 0.0–0.7)
Eosinophils Relative: 5.1 % — ABNORMAL HIGH (ref 0.0–5.0)
HCT: 33.3 % — ABNORMAL LOW (ref 36.0–46.0)
Hemoglobin: 10.7 g/dL — ABNORMAL LOW (ref 12.0–15.0)
Lymphocytes Relative: 27.2 % (ref 12.0–46.0)
Lymphs Abs: 1.8 10*3/uL (ref 0.7–4.0)
MCHC: 32.1 g/dL (ref 30.0–36.0)
MCV: 75.7 fl — ABNORMAL LOW (ref 78.0–100.0)
Monocytes Absolute: 0.4 10*3/uL (ref 0.1–1.0)
Monocytes Relative: 6.4 % (ref 3.0–12.0)
Neutro Abs: 4.1 10*3/uL (ref 1.4–7.7)
Neutrophils Relative %: 60.6 % (ref 43.0–77.0)
Platelets: 251 10*3/uL (ref 150.0–400.0)
RBC: 4.41 Mil/uL (ref 3.87–5.11)
RDW: 15.4 % (ref 11.5–15.5)
WBC: 6.7 10*3/uL (ref 4.0–10.5)

## 2021-01-25 LAB — URINALYSIS, ROUTINE W REFLEX MICROSCOPIC
Bilirubin Urine: NEGATIVE
Ketones, ur: NEGATIVE
Leukocytes,Ua: NEGATIVE
Nitrite: NEGATIVE
Specific Gravity, Urine: 1.025 (ref 1.000–1.030)
Total Protein, Urine: NEGATIVE
Urine Glucose: NEGATIVE
Urobilinogen, UA: 0.2 (ref 0.0–1.0)
pH: 5.5 (ref 5.0–8.0)

## 2021-01-25 LAB — COMPREHENSIVE METABOLIC PANEL
ALT: 19 U/L (ref 0–35)
AST: 20 U/L (ref 0–37)
Albumin: 3.9 g/dL (ref 3.5–5.2)
Alkaline Phosphatase: 55 U/L (ref 39–117)
BUN: 11 mg/dL (ref 6–23)
CO2: 25 mEq/L (ref 19–32)
Calcium: 9 mg/dL (ref 8.4–10.5)
Chloride: 104 mEq/L (ref 96–112)
Creatinine, Ser: 0.74 mg/dL (ref 0.40–1.20)
GFR: 102.26 mL/min (ref >60.00)
Glucose, Bld: 94 mg/dL (ref 70–99)
Potassium: 3.8 mEq/L (ref 3.5–5.1)
Sodium: 137 mEq/L (ref 135–145)
Total Bilirubin: 0.4 mg/dL (ref 0.2–1.2)
Total Protein: 7.3 g/dL (ref 6.0–8.3)

## 2021-01-25 LAB — LIPID PANEL
Cholesterol: 175 mg/dL (ref 0–200)
HDL: 47.1 mg/dL (ref >39.00)
LDL Cholesterol: 98 mg/dL (ref 0–99)
NonHDL: 127.87
Total CHOL/HDL Ratio: 4
Triglycerides: 149 mg/dL (ref 0.0–149.0)
VLDL: 29.8 mg/dL (ref 0.0–40.0)

## 2021-01-25 LAB — HEMOGLOBIN A1C: Hgb A1c MFr Bld: 5.6 % (ref 4.6–6.5)

## 2021-01-25 LAB — TSH: TSH: 1.62 u[IU]/mL (ref 0.35–4.50)

## 2021-01-25 LAB — VITAMIN B12: Vitamin B-12: 287 pg/mL (ref 211–911)

## 2021-01-26 ENCOUNTER — Other Ambulatory Visit: Payer: Self-pay | Admitting: Internal Medicine

## 2021-01-26 LAB — COMPLETE METABOLIC PANEL WITH GFR
AG Ratio: 1.3 (calc) (ref 1.0–2.5)
ALT: 20 U/L (ref 6–29)
AST: 22 U/L (ref 10–30)
Albumin: 4 g/dL (ref 3.6–5.1)
Alkaline phosphatase (APISO): 55 U/L (ref 31–125)
BUN: 11 mg/dL (ref 7–25)
CO2: 25 mmol/L (ref 20–32)
Calcium: 9.1 mg/dL (ref 8.6–10.2)
Chloride: 104 mmol/L (ref 98–110)
Creat: 0.82 mg/dL (ref 0.50–1.10)
GFR, Est African American: 105 mL/min/{1.73_m2} (ref >=60)
GFR, Est Non African American: 91 mL/min/{1.73_m2} (ref >=60)
Globulin: 3 g/dL (calc) (ref 1.9–3.7)
Glucose, Bld: 88 mg/dL (ref 65–99)
Potassium: 3.9 mmol/L (ref 3.5–5.3)
Sodium: 138 mmol/L (ref 135–146)
Total Bilirubin: 0.3 mg/dL (ref 0.2–1.2)
Total Protein: 7 g/dL (ref 6.1–8.1)

## 2021-01-26 LAB — IRON,TIBC AND FERRITIN PANEL
%SAT: 9 % (calc) — ABNORMAL LOW (ref 16–45)
Ferritin: 8 ng/mL — ABNORMAL LOW (ref 16–154)
Iron: 39 ug/dL — ABNORMAL LOW (ref 40–190)
TIBC: 437 mcg/dL (calc) (ref 250–450)

## 2021-01-26 MED ORDER — FERROUS SULFATE 325 (65 FE) MG PO TABS
325.0000 mg | ORAL_TABLET | Freq: Every day | ORAL | 1 refills | Status: DC
  Filled 2021-01-26: qty 90, 90d supply, fill #0

## 2021-01-28 ENCOUNTER — Other Ambulatory Visit (HOSPITAL_COMMUNITY): Payer: Self-pay

## 2021-02-05 ENCOUNTER — Other Ambulatory Visit (HOSPITAL_COMMUNITY): Payer: Self-pay

## 2021-03-12 ENCOUNTER — Other Ambulatory Visit (HOSPITAL_COMMUNITY): Payer: Self-pay

## 2021-03-12 NOTE — Telephone Encounter (Signed)
Not on med list pls advise.Marland KitchenJohny Schwartz

## 2021-03-14 ENCOUNTER — Other Ambulatory Visit (HOSPITAL_COMMUNITY): Payer: Self-pay

## 2021-03-14 ENCOUNTER — Other Ambulatory Visit: Payer: Self-pay | Admitting: Internal Medicine

## 2021-03-14 MED ORDER — EPINEPHRINE 0.3 MG/0.3ML IJ SOAJ
0.3000 mg | INTRAMUSCULAR | 3 refills | Status: DC | PRN
  Filled 2021-03-14: qty 2, 30d supply, fill #0

## 2021-04-08 DIAGNOSIS — R002 Palpitations: Secondary | ICD-10-CM

## 2021-04-08 HISTORY — DX: Palpitations: R00.2

## 2021-04-16 ENCOUNTER — Ambulatory Visit (INDEPENDENT_AMBULATORY_CARE_PROVIDER_SITE_OTHER): Payer: No Typology Code available for payment source | Admitting: Family Medicine

## 2021-04-16 DIAGNOSIS — R0602 Shortness of breath: Secondary | ICD-10-CM

## 2021-04-16 DIAGNOSIS — Z1331 Encounter for screening for depression: Secondary | ICD-10-CM

## 2021-04-16 DIAGNOSIS — R5383 Other fatigue: Secondary | ICD-10-CM

## 2021-04-29 ENCOUNTER — Ambulatory Visit (INDEPENDENT_AMBULATORY_CARE_PROVIDER_SITE_OTHER): Payer: No Typology Code available for payment source | Admitting: Internal Medicine

## 2021-04-29 ENCOUNTER — Other Ambulatory Visit: Payer: Self-pay

## 2021-04-29 ENCOUNTER — Other Ambulatory Visit (HOSPITAL_COMMUNITY): Payer: Self-pay

## 2021-04-29 ENCOUNTER — Encounter: Payer: Self-pay | Admitting: Internal Medicine

## 2021-04-29 ENCOUNTER — Encounter: Payer: Self-pay | Admitting: Oncology

## 2021-04-29 VITALS — BP 120/68 | HR 84 | Temp 98.9°F | Ht 63.0 in | Wt 247.4 lb

## 2021-04-29 DIAGNOSIS — N921 Excessive and frequent menstruation with irregular cycle: Secondary | ICD-10-CM

## 2021-04-29 DIAGNOSIS — D500 Iron deficiency anemia secondary to blood loss (chronic): Secondary | ICD-10-CM

## 2021-04-29 DIAGNOSIS — F411 Generalized anxiety disorder: Secondary | ICD-10-CM | POA: Diagnosis not present

## 2021-04-29 DIAGNOSIS — N920 Excessive and frequent menstruation with regular cycle: Secondary | ICD-10-CM | POA: Insufficient documentation

## 2021-04-29 DIAGNOSIS — R002 Palpitations: Secondary | ICD-10-CM | POA: Diagnosis not present

## 2021-04-29 LAB — HEPATIC FUNCTION PANEL
ALT: 15 U/L (ref 0–35)
AST: 18 U/L (ref 0–37)
Albumin: 3.8 g/dL (ref 3.5–5.2)
Alkaline Phosphatase: 58 U/L (ref 39–117)
Bilirubin, Direct: 0.1 mg/dL (ref 0.0–0.3)
Total Bilirubin: 0.3 mg/dL (ref 0.2–1.2)
Total Protein: 7.4 g/dL (ref 6.0–8.3)

## 2021-04-29 LAB — CBC WITH DIFFERENTIAL/PLATELET
Basophils Absolute: 0.1 K/uL (ref 0.0–0.1)
Basophils Relative: 0.8 % (ref 0.0–3.0)
Eosinophils Absolute: 0.5 K/uL (ref 0.0–0.7)
Eosinophils Relative: 7.3 % — ABNORMAL HIGH (ref 0.0–5.0)
HCT: 31.9 % — ABNORMAL LOW (ref 36.0–46.0)
Hemoglobin: 10 g/dL — ABNORMAL LOW (ref 12.0–15.0)
Lymphocytes Relative: 25.6 % (ref 12.0–46.0)
Lymphs Abs: 1.8 K/uL (ref 0.7–4.0)
MCHC: 31.2 g/dL (ref 30.0–36.0)
MCV: 75.7 fl — ABNORMAL LOW (ref 78.0–100.0)
Monocytes Absolute: 0.7 K/uL (ref 0.1–1.0)
Monocytes Relative: 9.6 % (ref 3.0–12.0)
Neutro Abs: 4 K/uL (ref 1.4–7.7)
Neutrophils Relative %: 56.7 % (ref 43.0–77.0)
Platelets: 242 K/uL (ref 150.0–400.0)
RBC: 4.21 Mil/uL (ref 3.87–5.11)
RDW: 16.4 % — ABNORMAL HIGH (ref 11.5–15.5)
WBC: 7 K/uL (ref 4.0–10.5)

## 2021-04-29 LAB — BASIC METABOLIC PANEL WITH GFR
BUN: 9 mg/dL (ref 6–23)
CO2: 28 meq/L (ref 19–32)
Calcium: 9 mg/dL (ref 8.4–10.5)
Chloride: 104 meq/L (ref 96–112)
Creatinine, Ser: 0.69 mg/dL (ref 0.40–1.20)
GFR: 109.49 mL/min
Glucose, Bld: 86 mg/dL (ref 70–99)
Potassium: 4 meq/L (ref 3.5–5.1)
Sodium: 137 meq/L (ref 135–145)

## 2021-04-29 LAB — TSH: TSH: 1.83 u[IU]/mL (ref 0.35–5.50)

## 2021-04-29 MED ORDER — POLYSACCHARIDE IRON COMPLEX 150 MG PO CAPS
150.0000 mg | ORAL_CAPSULE | Freq: Every day | ORAL | 1 refills | Status: DC
  Filled 2021-04-29: qty 90, 90d supply, fill #0

## 2021-04-29 NOTE — Progress Notes (Signed)
Patient ID: Deanna Schwartz, female   DOB: 1981-02-12, 40 y.o.   MRN: [id number]        Chief Complaint: follow up palpittations       HPI:  Deanna Schwartz is a 40 y.o. female here with c/o intermitttent palpitations in last wk, occurred this am and made her quite anxious, associated with sob doe but no CP, n/v, diaphoresis, or syncope.  Overall mild, intermittent, nothing seems to make better or worse.  Denies worsening reflux, abd pain, dysphagia, n/v, bowel change or blood.   Pt denies fever, wt loss, night sweats, loss of appetite, or other constitutional symptoms  Denies worsening depressive symptoms, suicidal ideation, or panic; has ongoing anxiety, and occasional panic with increased family stressor.  During her visit the daughter is nearly constantly calling and recalling on the patient cell phone for over 10 min straight though pt keeps declining the call to participate in the exam.  Pt does have ongoing menhorrhagia, has appt with GYN next month, not taking iron supplement except for minor in a Mvi.         Wt Readings from Last 3 Encounters:  04/29/21 247 lb 6.4 oz (112.2 kg)  01/24/21 243 lb 9.6 oz (110.5 kg)  07/10/20 240 lb 3.2 oz (109 kg)   BP Readings from Last 3 Encounters:  04/29/21 120/68  01/24/21 122/78  07/10/20 128/82         Past Medical History:  Diagnosis Date   Allergic rhinitis    Anemia    Anxiety    Asthma    B12 deficiency with anti-parietal cell antibodies + 03/25/2011   New 03/2011    Chronic constipation    Constipation    Depression    GERD (gastroesophageal reflux disease)    H. pylori infection 2008   Hx of tx + serology   Iron deficiency anemia 08/06/2011   Hgb 10.2 and MCV 79 on health screen labs    Irritable bowel syndrome    Obesity    Palpitations    Panic attack    Shortness of breath    Ulcer    Past Surgical History:  Procedure Laterality Date   FLEXIBLE SIGMOIDOSCOPY  04/14/2008   normal   NASAL SINUS SURGERY     UPPER  GASTROINTESTINAL ENDOSCOPY      reports that she has never smoked. She has never used smokeless tobacco. She reports current alcohol use. She reports that she does not use drugs. family history includes Anxiety disorder in her mother; Colon polyps in her paternal uncle; Diabetes in her maternal aunt, maternal aunt, paternal grandmother, and unknown relative; Hypertension in her father, mother, and unknown relative; Obesity in her mother. Allergies  Allergen Reactions   Augmentin [Amoxicillin-Pot Clavulanate] Rash   Cephalexin Rash   Iodine Swelling and Rash    Throat swelling after eating shrimp   Current Outpatient Medications on File Prior to Visit  Medication Sig Dispense Refill   albuterol (VENTOLIN HFA) 108 (90 Base) MCG/ACT inhaler INHALE 1 TO 2 PUFFS INTO THE LUNGS EVERY 6 HOURS AS NEEDED FOR WHEEZING OR SHORTNESS OF BREATH 18 g 5   cetirizine (ZYRTEC) 10 MG tablet Take 1 tablet (10 mg total) by mouth daily. 90 tablet 3   Cholecalciferol (VITAMIN D3) 50 MCG (2000 UT) capsule Take 2 capsules (4,000 Units total) by mouth daily. 200 capsule 3   cyanocobalamin (,VITAMIN B-12,) 1000 MCG/ML injection INJECT 1 ML INTO THE MUSCLE EVERY 14 DAYS 10 mL 11  EPINEPHrine (EPIPEN 2-PAK) 0.3 mg/0.3 mL IJ SOAJ injection Inject 1 pen (0.3 mg) into the muscle as needed for anaphylaxis. 2 each 3   ferrous sulfate 325 (65 FE) MG tablet Take 1 tablet (325 mg total) by mouth daily. 90 tablet 1   No current facility-administered medications on file prior to visit.        ROS:  All others reviewed and negative.  Objective        PE:  BP 120/68 (BP Location: Right Arm, Patient Position: Sitting, Cuff Size: Large)   Pulse 84   Temp 98.9 F (37.2 C) (Oral)   Ht '5\' 3"'$  (1.6 m)   Wt 247 lb 6.4 oz (112.2 kg)   SpO2 99%   BMI 43.82 kg/m                 Constitutional: Pt appears in NAD               HENT: Head: NCAT.                Right Ear: External ear normal.                 Left Ear: External ear  normal.                Eyes: . Pupils are equal, round, and reactive to light. Conjunctivae and EOM are normal               Nose: without d/c or deformity               Neck: Neck supple. Gross normal ROM               Cardiovascular: Normal rate and regular rhythm.                 Pulmonary/Chest: Effort normal and breath sounds without rales or wheezing.                Abd:  Soft, NT, ND, + BS, no organomegaly               Neurological: Pt is alert. At baseline orientation, motor grossly intact               Skin: Skin is warm. No rashes, no other new lesions, LE edema - none               Psychiatric: Pt behavior is normal without agitation , mod nervous  Micro: none  Cardiac tracings I have personally interpreted today:  NSR - 85  Pertinent Radiological findings (summarize): none   Lab Results  Component Value Date   WBC 7.0 04/29/2021   HGB 10.0 (L) 04/29/2021   HCT 31.9 (L) 04/29/2021   PLT 242.0 04/29/2021   GLUCOSE 86 04/29/2021   CHOL 175 01/25/2021   TRIG 149.0 01/25/2021   HDL 47.10 01/25/2021   LDLDIRECT 86.0 10/13/2017   LDLCALC 98 01/25/2021   ALT 15 04/29/2021   AST 18 04/29/2021   NA 137 04/29/2021   K 4.0 04/29/2021   CL 104 04/29/2021   CREATININE 0.69 04/29/2021   BUN 9 04/29/2021   CO2 28 04/29/2021   TSH 1.83 04/29/2021   INR 1.0 07/09/2020   HGBA1C 5.6 01/25/2021   Assessment/Plan:  Deanna Schwartz is a 40 y.o. Black or African American [2] female with  has a past medical history of Allergic rhinitis, Anemia, Anxiety, Asthma, B12 deficiency with anti-parietal cell antibodies + (03/25/2011), Chronic  constipation, Constipation, Depression, GERD (gastroesophageal reflux disease), H. pylori infection (2008), Iron deficiency anemia (08/06/2011), Irritable bowel syndrome, Obesity, Palpitations, Panic attack, Shortness of breath, and Ulcer.  Palpitations Etiology unclear, ecg reviewed, for echo, cardiac event monitor, cbc and tsh with labs as ordered,  and f/u pcp  Menorrhagia For cbc with labs, and f/u gyn next month  Iron deficiency anemia due to chronic blood loss - menses Also for iron with labs  Generalized anxiety disorder D/w pt, declines additional tx such as SSRI  Followup: Return if symptoms worsen or fail to improve.  Cathlean Cower, MD 04/29/2021 8:40 PM [facility]

## 2021-04-29 NOTE — Assessment & Plan Note (Signed)
Etiology unclear, ecg reviewed, for echo, cardiac event monitor, cbc and tsh with labs as ordered, and f/u pcp

## 2021-04-29 NOTE — Patient Instructions (Signed)
Your EKG was OK today  Please continue all other medications as before, and refills have been done if requested.  Please have the pharmacy call with any other refills you may need.  Please continue your efforts at being more active, low cholesterol diet, and weight control.  You are otherwise up to date with prevention measures today.  Please keep your appointments with your specialists as you may have planned  You will be contacted regarding the referral for: Echocardiogram, and Cardiac Monitor  Please go to the LAB at the blood drawing area for the tests to be done  You will be contacted by phone if any changes need to be made immediately.  Otherwise, you will receive a letter about your results with an explanation, but please check with MyChart first.  Please remember to sign up for MyChart if you have not done so, as this will be important to you in the future with finding out test results, communicating by private email, and scheduling acute appointments online when needed.

## 2021-04-29 NOTE — Assessment & Plan Note (Signed)
D/w pt, declines additional tx such as SSRI

## 2021-04-29 NOTE — Assessment & Plan Note (Signed)
For cbc with labs, and f/u gyn next month

## 2021-04-29 NOTE — Assessment & Plan Note (Signed)
Also for iron with labs 

## 2021-04-30 ENCOUNTER — Encounter: Payer: Self-pay | Admitting: Internal Medicine

## 2021-04-30 ENCOUNTER — Ambulatory Visit (INDEPENDENT_AMBULATORY_CARE_PROVIDER_SITE_OTHER): Payer: Self-pay | Admitting: Family Medicine

## 2021-05-08 ENCOUNTER — Other Ambulatory Visit (HOSPITAL_COMMUNITY): Payer: Self-pay

## 2021-06-04 ENCOUNTER — Encounter: Payer: Self-pay | Admitting: Internal Medicine

## 2021-06-04 ENCOUNTER — Other Ambulatory Visit: Payer: Self-pay

## 2021-06-04 ENCOUNTER — Ambulatory Visit (HOSPITAL_COMMUNITY): Payer: No Typology Code available for payment source | Attending: Internal Medicine

## 2021-06-04 DIAGNOSIS — R002 Palpitations: Secondary | ICD-10-CM | POA: Insufficient documentation

## 2021-06-04 LAB — ECHOCARDIOGRAM COMPLETE
Area-P 1/2: 3.51 cm2
S' Lateral: 2.9 cm

## 2021-06-06 ENCOUNTER — Other Ambulatory Visit: Payer: Self-pay | Admitting: Internal Medicine

## 2021-06-06 ENCOUNTER — Other Ambulatory Visit (HOSPITAL_COMMUNITY): Payer: Self-pay

## 2021-06-06 LAB — HM PAP SMEAR: HM Pap smear: NORMAL

## 2021-06-06 MED ORDER — BUDESONIDE-FORMOTEROL FUMARATE 160-4.5 MCG/ACT IN AERO
2.0000 | INHALATION_SPRAY | Freq: Two times a day (BID) | RESPIRATORY_TRACT | 3 refills | Status: DC
  Filled 2021-06-06: qty 10.2, 30d supply, fill #0
  Filled 2021-10-28: qty 10.2, 30d supply, fill #1

## 2021-06-07 ENCOUNTER — Other Ambulatory Visit (HOSPITAL_COMMUNITY): Payer: Self-pay

## 2021-06-11 ENCOUNTER — Encounter: Payer: Self-pay | Admitting: Internal Medicine

## 2021-06-13 ENCOUNTER — Ambulatory Visit
Admission: EM | Admit: 2021-06-13 | Discharge: 2021-06-13 | Disposition: A | Discharge status: Home / Self Care | Payer: No Typology Code available for payment source | Attending: Emergency Medicine | Admitting: Emergency Medicine

## 2021-06-13 ENCOUNTER — Other Ambulatory Visit: Payer: Self-pay

## 2021-06-13 ENCOUNTER — Telehealth: Payer: Self-pay | Admitting: Internal Medicine

## 2021-06-13 DIAGNOSIS — J069 Acute upper respiratory infection, unspecified: Secondary | ICD-10-CM

## 2021-06-13 DIAGNOSIS — J4541 Moderate persistent asthma with (acute) exacerbation: Secondary | ICD-10-CM

## 2021-06-13 MED ORDER — METHYLPREDNISOLONE SODIUM SUCC 125 MG IJ SOLR
125.0000 mg | Freq: Once | INTRAMUSCULAR | Status: AC
  Administered 2021-06-13: 125 mg via INTRAMUSCULAR

## 2021-06-13 MED ORDER — PREDNISOLONE 5 MG PO TABS
30.0000 mg | ORAL_TABLET | Freq: Two times a day (BID) | ORAL | 0 refills | Status: DC
  Filled 2021-06-14: qty 84, 7d supply, fill #0

## 2021-06-13 MED ORDER — MONTELUKAST SODIUM 10 MG PO TABS: 10.0000 mg | ORAL_TABLET | Freq: Every day | ORAL | 0 refills | Status: DC

## 2021-06-13 NOTE — Telephone Encounter (Signed)
   Patient calling to report SOB/ trouble breathing. This can be heard over the phone. Call transferred to [facility] for triage

## 2021-06-13 NOTE — ED Provider Notes (Signed)
[facility]    CSN: [id number] Arrival date & time: 06/13/21  1528      History   Chief Complaint Chief Complaint  Patient presents with   Shortness of Breath    HPI Deanna Schwartz is a 40 y.o. female.   Patient reports a history of asthma well-controlled on Symbicort 160 mcg twice daily.  Patient states that 2 weeks ago, she got an upper respiratory infection that caused her asthma to "flareup".  Patient states she has some extra prednisone on hand so she initiated a self tapering dose beginning at 50 mg down to 10.  Patient states she does not feel that she is gotten any better.  Patient states she has not missed any doses of Symbicort.  Patient states has been using her emergency albuterol inhaler with little improvement.  Patient states she is also taking Zyrtec daily and using Flonase nasal steroid intermittently.  Oxygen saturation is 93% on presentation today, respirations are normal.  Patient denies fever, aches, chills, nausea, vomiting, diarrhea, sore throat endorses pain with coughing in her back.  Patient states that she usually has a similar flareup annually, states she was not able to get in with her primary care provider.  The history is provided by the patient.   Past Medical History:  Diagnosis Date   Allergic rhinitis    Anemia    Anxiety    Asthma    B12 deficiency with anti-parietal cell antibodies + 03/25/2011   New 03/2011    Chronic constipation    Constipation    Depression    GERD (gastroesophageal reflux disease)    H. pylori infection 2008   Hx of tx + serology   Iron deficiency anemia 08/06/2011   Hgb 10.2 and MCV 79 on health screen labs    Irritable bowel syndrome    Obesity    Palpitations    Panic attack    Shortness of breath    Ulcer     Patient Active Problem List   Diagnosis Date Noted   Menorrhagia 04/29/2021   Acute bronchitis 12/25/2020   Epistaxis 07/10/2020   Hamstring strain, right, subsequent encounter  07/18/2019   Numbness 06/29/2019   Low back pain with sciatica 06/29/2019   Mid back pain 04/21/2019   Concussion with no loss of consciousness 04/21/2019   Nonallopathic lesion of cervical region 06/14/2018   Sternoclavicular joint pain, right 06/14/2018   Trapezius muscle spasm 05/22/2017   Knee MCL sprain 11/13/2016   Depression 11/10/2016   Class 2 obesity without serious comorbidity with body mass index (BMI) of 39.0 to 39.9 in adult 10/14/2016   Nonallopathic lesion of sacral region 10/10/2016   Panic attack 08/15/2016   Coccydynia 06/10/2016   Vasovagal syncope 03/14/2016   RUQ pain 03/14/2016   Vitamin D deficiency 11/22/2015   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   Nonallopathic lesion of thoracic region 02/21/2015   Nonallopathic lesion of lumbosacral region 02/21/2015   Nonallopathic lesion-rib cage 02/21/2015   Piriformis syndrome of left side 02/21/2015   Cervicalgia 01/22/2015   Obesity (BMI 35.0-39.9 without comorbidity) 01/22/2015   Muscle tension headache 01/22/2015   Iron deficiency anemia due to chronic blood loss - menses 01/03/2015   Chronic chest wall pain - mostly left side 01/03/2015   Well adult exam 11/13/2014   B12 deficiency 11/13/2014   Acromioclavicular joint separation, type 1 07/12/2014   Trochanteric bursitis of left hip 06/20/2014   Patellofemoral pain syndrome 04/07/2014   Acute medial meniscal  tear 01/25/2014   Weight gain 11/18/2013   Anemia, iron deficiency 11/18/2013   Acute sinusitis 11/05/2013   Abdominal pain, left upper quadrant-musculoskeletal 05/13/2013   Iron deficiency anemia 08/06/2011   Arthralgia 03/24/2011   INSOMNIA, CHRONIC 10/11/2010   IBS 04/07/2008   Palpitations 01/11/2008   Generalized anxiety disorder 11/25/2007   Allergic rhinitis 11/25/2007   GERD 11/25/2007   SINUSITIS, CHRONIC 04/03/2007   Asthma 04/03/2007    Past Surgical History:  Procedure Laterality Date   FLEXIBLE SIGMOIDOSCOPY  04/14/2008   normal    NASAL SINUS SURGERY     UPPER GASTROINTESTINAL ENDOSCOPY      OB History     Gravida  4   Para  3   Term  3   Preterm      AB  1   Living  3      SAB  1   IAB      Ectopic      Multiple      Live Births               Home Medications    Prior to Admission medications   Medication Sig Start Date End Date Taking? Authorizing Provider  montelukast (SINGULAIR) 10 MG tablet Take 1 tablet (10 mg total) by mouth at bedtime. 06/13/21 07/13/21 Yes Lynden Oxford Scales, PA-C  prednisoLONE 5 MG TABS tablet Take 6 tablets (30 mg total) by mouth 2 (two) times daily for 7 days. 06/13/21 06/20/21 Yes Lynden Oxford Scales, PA-C  albuterol (VENTOLIN HFA) 108 (90 Base) MCG/ACT inhaler INHALE 1 TO 2 PUFFS INTO THE LUNGS EVERY 6 HOURS AS NEEDED FOR WHEEZING OR SHORTNESS OF BREATH 01/24/21 01/24/22  Plotnikov, Evie Lacks, MD  budesonide-formoterol (SYMBICORT) 160-4.5 MCG/ACT inhaler Inhale 2 puffs into the lungs 2 (two) times daily. 06/06/21   Plotnikov, Evie Lacks, MD  cetirizine (ZYRTEC) 10 MG tablet Take 1 tablet (10 mg total) by mouth daily. 11/05/18   Marrian Salvage, FNP  Cholecalciferol (VITAMIN D3) 50 MCG (2000 UT) capsule Take 2 capsules (4,000 Units total) by mouth daily. 07/10/20   Plotnikov, Evie Lacks, MD  cyanocobalamin (,VITAMIN B-12,) 1000 MCG/ML injection INJECT 1 ML INTO THE MUSCLE EVERY 14 DAYS 07/10/20 07/10/21  Plotnikov, Evie Lacks, MD  EPINEPHrine (EPIPEN 2-PAK) 0.3 mg/0.3 mL IJ SOAJ injection Inject 1 pen (0.3 mg) into the muscle as needed for anaphylaxis. 03/14/21   Plotnikov, Evie Lacks, MD  ferrous sulfate 325 (65 FE) MG tablet Take 1 tablet (325 mg total) by mouth daily. 01/26/21 01/26/22  Plotnikov, Evie Lacks, MD  iron polysaccharides (NU-IRON) 150 MG capsule Take 1 capsule by mouth daily. 04/29/21   Biagio Borg, MD    Family History Family History  Problem Relation Age of Onset   Diabetes Unknown        grandmother   Hypertension Unknown    Hypertension  Mother    Anxiety disorder Mother    Obesity Mother    Hypertension Father    Diabetes Maternal Aunt    Diabetes Maternal Aunt    Diabetes Paternal Grandmother    Colon polyps Paternal Uncle        x4   Colon cancer Neg Hx     Social History Social History   Tobacco Use   Smoking status: Never   Smokeless tobacco: Never  Vaping Use   Vaping Use: Never used  Substance Use Topics   Alcohol use: Yes    Alcohol/week: 0.0 standard drinks  Comment: Social maybe twice amonth, beer   Drug use: No     Allergies   Augmentin [amoxicillin-pot clavulanate], Cephalexin, and Iodine   Review of Systems Review of Systems Pertinent findings noted in history of present illness.    Physical Exam Triage Vital Signs ED Triage Vitals  Enc Vitals Group     BP 06/13/21 1546 127/69     Pulse Rate 06/13/21 1546 96     Resp 06/13/21 1546 20     Temp 06/13/21 1546 98.4 F (36.9 C)     Temp Source 06/13/21 1546 Oral     SpO2 06/13/21 1546 94 %     Weight --      Height --      Head Circumference --      Peak Flow --      Pain Score 06/13/21 1549 0     Pain Loc --      Pain Edu? --      Excl. in [place]? --    No data found.  Updated Vital Signs BP 127/69 (BP Location: Right Arm)   Pulse 96   Temp 98.4 F (36.9 C) (Oral)   Resp 20   SpO2 95%   Visual Acuity Right Eye Distance:   Left Eye Distance:   Bilateral Distance:    Right Eye Near:   Left Eye Near:    Bilateral Near:     Physical Exam Vitals and nursing note reviewed.  Constitutional:      Appearance: Normal appearance.     Comments: Patient is in mild distress, nontoxic in appearance  HENT:     Head: Normocephalic and atraumatic.     Right Ear: Tympanic membrane, ear canal and external ear normal.     Left Ear: Tympanic membrane, ear canal and external ear normal.     Nose: Nose normal.     Mouth/Throat:     Mouth: Mucous membranes are moist.     Pharynx: Oropharynx is clear.  Eyes:     Extraocular  Movements: Extraocular movements intact.     Conjunctiva/sclera: Conjunctivae normal.     Pupils: Pupils are equal, round, and reactive to light.  Cardiovascular:     Rate and Rhythm: Normal rate and regular rhythm.     Pulses: Normal pulses.     Heart sounds: Normal heart sounds.  Pulmonary:     Effort: Pulmonary effort is normal.     Breath sounds: Decreased air movement present. No stridor.     Comments: Diffuse wheezing throughout all lung fields on initial examination. Musculoskeletal:        General: Normal range of motion.     Cervical back: Normal range of motion and neck supple. No rigidity.  Lymphadenopathy:     Cervical: No cervical adenopathy.  Skin:    General: Skin is warm and dry.  Neurological:     General: No focal deficit present.     Mental Status: She is alert and oriented to person, place, and time. Mental status is at baseline.  Psychiatric:        Mood and Affect: Mood normal.        Behavior: Behavior normal.     UC Treatments / Results  Labs (all labs ordered are listed, but only abnormal results are displayed) Labs Reviewed  COVID-19, FLU A+B NAA    EKG   Radiology No results found.  Procedures Procedures (including critical care time)  Medications Ordered in UC Medications  methylPREDNISolone sodium succinate (  SOLU-MEDROL) 125 mg/2 mL injection 125 mg (125 mg Intramuscular Given 06/13/21 1616)    Initial Impression / Assessment and Plan / UC Course  I have reviewed the triage vital signs and the nursing notes.  Pertinent labs & imaging results that were available during my care of the patient were reviewed by me and considered in my medical decision making (see chart for details).     Patient was provided with an injection of Solu-Medrol 125 mg in office today.  Upon repeat evaluation, breath sounds had improved and wheezing at decreased.  I provided patient with a prescription for prednisolone 30 mg twice daily for the next 7 days.  I  also added a prescription for Singulair which she states she is currently not taking nor has she been prescribed for her to take during the fall season in addition to her Zyrtec and Flonase.  Patient verbalized understanding and agreement of plan as discussed.  All questions were addressed during visit.  Please see discharge instructions below for further details of plan.  Final Clinical Impressions(s) / UC Diagnoses   Final diagnoses:  Moderate persistent asthma with acute exacerbation  Viral upper respiratory illness     Discharge Instructions      You were provided with an injection of a steroid called Solu-Medrol, generic name is methylprednisolone during your visit today.  This should provide you with some short-term temporary relief within the next hour or 2.  I also provided you with a prescription for prednisolone, which is a oral version of this medication.  Please take 6 tablets morning and evening for the next 7 days.  If your symptoms have completely resolved after 3 days before the full 7-day course, you can discontinue prednisolone, it is not necessary to complete the full 7 days.  Tapering of this medication is not indicated, there is no rebound effect or adverse reaction with abrupt discontinuation.     ED Prescriptions     Medication Sig Dispense Auth. Provider   prednisoLONE 5 MG TABS tablet Take 6 tablets (30 mg total) by mouth 2 (two) times daily for 7 days. 84 tablet Lynden Oxford Scales, PA-C   montelukast (SINGULAIR) 10 MG tablet Take 1 tablet (10 mg total) by mouth at bedtime. 30 tablet Lynden Oxford Scales, PA-C      PDMP not reviewed this encounter.   Lynden Oxford Scales, PA-C 06/13/21 1630

## 2021-06-13 NOTE — Telephone Encounter (Signed)
Patient called back states she is going to urgent care

## 2021-06-13 NOTE — Discharge Instructions (Addendum)
You were provided with an injection of a steroid called Solu-Medrol, generic name is methylprednisolone during your visit today.  This should provide you with some short-term temporary relief within the next hour or 2.  I also provided you with a prescription for prednisolone, which is a oral version of this medication.  Please take 6 tablets morning and evening for the next 7 days.  If your symptoms have completely resolved after 3 days before the full 7-day course, you can discontinue prednisolone, it is not necessary to complete the full 7 days.  Tapering of this medication is not indicated, there is no rebound effect or adverse reaction with abrupt discontinuation.

## 2021-06-13 NOTE — ED Triage Notes (Signed)
Pt reporters having an asthma flare up, chest tightness with a congestion.   Pt reports using an albuterol inhaler that is not helping.

## 2021-06-14 ENCOUNTER — Other Ambulatory Visit (HOSPITAL_COMMUNITY): Payer: Self-pay

## 2021-06-14 MED ORDER — CLOBETASOL PROPIONATE 0.05 % EX SOLN
CUTANEOUS | 3 refills | Status: DC
  Filled 2021-06-14: qty 50, 30d supply, fill #0

## 2021-06-14 MED ORDER — PREDNISOLONE 5 MG PO TABS: 30.0000 mg | ORAL_TABLET | Freq: Two times a day (BID) | ORAL | 0 refills | Status: AC

## 2021-06-14 NOTE — Telephone Encounter (Signed)
Pharmacy called to ask if provider would change patient medication to prednisone due to lack of original medication. Patient called and verified if patient is okay with trying another round of prednisone (per provider request) patient states she is okay with that.

## 2021-06-15 LAB — COVID-19, FLU A+B NAA
Influenza A, NAA: NOT DETECTED
Influenza B, NAA: NOT DETECTED
SARS-CoV-2, NAA: DETECTED — AB

## 2021-06-20 ENCOUNTER — Other Ambulatory Visit (HOSPITAL_COMMUNITY): Payer: Self-pay

## 2021-06-25 ENCOUNTER — Ambulatory Visit: Payer: No Typology Code available for payment source | Admitting: Internal Medicine

## 2021-07-04 ENCOUNTER — Telehealth: Payer: Self-pay | Admitting: Internal Medicine

## 2021-07-04 ENCOUNTER — Emergency Department (HOSPITAL_COMMUNITY): Payer: No Typology Code available for payment source

## 2021-07-04 ENCOUNTER — Encounter (HOSPITAL_COMMUNITY): Payer: Self-pay

## 2021-07-04 ENCOUNTER — Emergency Department (HOSPITAL_COMMUNITY)
Admission: EM | Admit: 2021-07-04 | Discharge: 2021-07-04 | Disposition: A | Discharge status: Home / Self Care | Payer: No Typology Code available for payment source | Attending: Emergency Medicine | Admitting: Emergency Medicine

## 2021-07-04 DIAGNOSIS — Z7951 Long term (current) use of inhaled steroids: Secondary | ICD-10-CM | POA: Diagnosis not present

## 2021-07-04 DIAGNOSIS — J45909 Unspecified asthma, uncomplicated: Secondary | ICD-10-CM | POA: Diagnosis not present

## 2021-07-04 DIAGNOSIS — B349 Viral infection, unspecified: Secondary | ICD-10-CM | POA: Insufficient documentation

## 2021-07-04 DIAGNOSIS — Z20822 Contact with and (suspected) exposure to covid-19: Secondary | ICD-10-CM | POA: Diagnosis not present

## 2021-07-04 DIAGNOSIS — Z8616 Personal history of COVID-19: Secondary | ICD-10-CM | POA: Insufficient documentation

## 2021-07-04 DIAGNOSIS — R079 Chest pain, unspecified: Secondary | ICD-10-CM | POA: Insufficient documentation

## 2021-07-04 DIAGNOSIS — R52 Pain, unspecified: Secondary | ICD-10-CM

## 2021-07-04 DIAGNOSIS — R0602 Shortness of breath: Secondary | ICD-10-CM | POA: Diagnosis present

## 2021-07-04 LAB — CBC WITH DIFFERENTIAL/PLATELET
Abs Immature Granulocytes: 0.02 10*3/uL (ref 0.00–0.07)
Basophils Absolute: 0 10*3/uL (ref 0.0–0.1)
Basophils Relative: 1 %
Eosinophils Absolute: 0.7 10*3/uL — ABNORMAL HIGH (ref 0.0–0.5)
Eosinophils Relative: 11 %
HCT: 34.6 % — ABNORMAL LOW (ref 36.0–46.0)
Hemoglobin: 10.3 g/dL — ABNORMAL LOW (ref 12.0–15.0)
Immature Granulocytes: 0 %
Lymphocytes Relative: 27 %
Lymphs Abs: 1.8 10*3/uL (ref 0.7–4.0)
MCH: 23.4 pg — ABNORMAL LOW (ref 26.0–34.0)
MCHC: 29.8 g/dL — ABNORMAL LOW (ref 30.0–36.0)
MCV: 78.5 fL — ABNORMAL LOW (ref 80.0–100.0)
Monocytes Absolute: 0.4 10*3/uL (ref 0.1–1.0)
Monocytes Relative: 6 %
Neutro Abs: 3.9 10*3/uL (ref 1.7–7.7)
Neutrophils Relative %: 55 %
Platelets: 301 10*3/uL (ref 150–400)
RBC: 4.41 MIL/uL (ref 3.87–5.11)
RDW: 14.9 % (ref 11.5–15.5)
WBC: 6.9 10*3/uL (ref 4.0–10.5)
nRBC: 0 % (ref 0.0–0.2)

## 2021-07-04 LAB — BASIC METABOLIC PANEL
Anion gap: 6 (ref 5–15)
BUN: 10 mg/dL (ref 6–20)
CO2: 24 mmol/L (ref 22–32)
Calcium: 8.8 mg/dL — ABNORMAL LOW (ref 8.9–10.3)
Chloride: 107 mmol/L (ref 98–111)
Creatinine, Ser: 0.64 mg/dL (ref 0.44–1.00)
GFR, Estimated: >60 mL/min (ref >60)
Glucose, Bld: 107 mg/dL — ABNORMAL HIGH (ref 70–99)
Potassium: 4.2 mmol/L (ref 3.5–5.1)
Sodium: 137 mmol/L (ref 135–145)

## 2021-07-04 LAB — TROPONIN I (HIGH SENSITIVITY)
Troponin I (High Sensitivity): <2 ng/L (ref <18)
Troponin I (High Sensitivity): <2 ng/L (ref <18)

## 2021-07-04 LAB — I-STAT BETA HCG BLOOD, ED (MC, WL, AP ONLY): I-stat hCG, quantitative: <5 m[IU]/mL (ref <5)

## 2021-07-04 MED ORDER — IOHEXOL 350 MG/ML SOLN
80.0000 mL | Freq: Once | INTRAVENOUS | Status: AC | PRN
  Administered 2021-07-04: 80 mL via INTRAVENOUS

## 2021-07-04 MED ORDER — PREDNISONE 20 MG PO TABS
60.0000 mg | ORAL_TABLET | Freq: Once | ORAL | Status: AC
  Administered 2021-07-04: 60 mg via ORAL
  Filled 2021-07-04: qty 3

## 2021-07-04 MED ORDER — ALBUTEROL SULFATE (2.5 MG/3ML) 0.083% IN NEBU
5.0000 mg | INHALATION_SOLUTION | Freq: Once | RESPIRATORY_TRACT | Status: AC
  Administered 2021-07-04: 5 mg via RESPIRATORY_TRACT
  Filled 2021-07-04: qty 6

## 2021-07-04 MED ORDER — IPRATROPIUM BROMIDE 0.02 % IN SOLN
0.5000 mg | Freq: Once | RESPIRATORY_TRACT | Status: AC
  Administered 2021-07-04: 0.5 mg via RESPIRATORY_TRACT
  Filled 2021-07-04: qty 2.5

## 2021-07-04 MED ORDER — OSELTAMIVIR PHOSPHATE 75 MG PO CAPS
75.0000 mg | ORAL_CAPSULE | Freq: Once | ORAL | Status: AC
  Administered 2021-07-04: 75 mg via ORAL
  Filled 2021-07-04: qty 1

## 2021-07-04 MED ORDER — OSELTAMIVIR PHOSPHATE 75 MG PO CAPS: 75.0000 mg | ORAL_CAPSULE | Freq: Two times a day (BID) | ORAL | 0 refills | Status: DC

## 2021-07-04 NOTE — Telephone Encounter (Signed)
Pt. Was transferred to triage and states she was diagnosed with COVID about 3 weeks prior to call and that she developed increased wheezing w/ sob about 2 days ago. Her Spo2 97-98. She developed chest pain that was rated a 4 on 1-10 scale. Her wheezing improves after using her asthma medication. Alert and responsive. Pt. Was advised to go to ED and patient complied on 10.27.2022.

## 2021-07-04 NOTE — ED Provider Notes (Signed)
Emergency Medicine Provider Triage Evaluation Note  Deanna Schwartz , a 40 y.o. female  was evaluated in triage.  Pt complains of chest pain associated with shortness of breath.  Chest pain worse with deep inspiration.  Patient also endorses left-sided upper back pain.  Patient denies history of blood clots, recent surgeries, recent long immobilizations, or hormonal treatments.  Patient recently diagnosed with COVID-19 3 weeks ago.  No lower extremity edema. Patient has a history of asthma.  She has used her inhaler with no relief.  Review of Systems  Positive: CP, SOB Negative: Lower extremity edema  Physical Exam  BP (!) 120/100 (BP Location: Left Arm)   Pulse 99   Temp (!) 97.1 F (36.2 C) (Oral)   Resp 18   LMP 06/09/2021   SpO2 99%  Gen:   Awake, no distress   Resp:  Normal effort  MSK:   Moves extremities without difficulty  Other:  Decreased air movement, no wheeze  Medical Decision Making  Medically screening exam initiated at 11:22 AM.  Appropriate orders placed.  [name] was informed that the remainder of the evaluation will be completed by another provider, this initial triage assessment does not replace that evaluation, and the importance of remaining in the ED until their evaluation is complete.  Cardiac labs CXR   Karie Kirks 07/04/21 1124    Regan Lemming, MD 07/04/21 1154

## 2021-07-04 NOTE — ED Notes (Signed)
Patient transported to CT 

## 2021-07-04 NOTE — Telephone Encounter (Signed)
Noted../lmb 

## 2021-07-04 NOTE — ED Triage Notes (Addendum)
Pt states chest pain that radiates to back since this morning. Pt has hx of asthma. Pt had COVID 3 weeks ago.  Pt states no relief with inhaler at home.  Pt's children were dx with the flu last week.

## 2021-07-04 NOTE — Telephone Encounter (Signed)
Patient states she feels like she's having an asthma flare up  Patient states she's wheezing, short of breath, and when she breathes her back and chest hurts  Patient states she tested positive for COVID on 06-13-2021  Transferred patient to team health

## 2021-07-04 NOTE — Discharge Instructions (Signed)
It appears that you have a viral illness, possibly influenza.  Check the results on MyChart later tonight to find out if you have the influenza infection.  If you do, start the Tamiflu prescription.  Otherwise take Tylenol every 4 hours for fever and pain.  The CAT scan did not show blood clot, pneumonia or other complications to be concerned about at this time.

## 2021-07-04 NOTE — ED Provider Notes (Signed)
[facility] DEPT Provider Note   CSN: [id number] Arrival date & time: 07/04/21  1031     History Chief Complaint  Patient presents with   Chest Pain   Shortness of Breath    Deanna Schwartz is a 40 y.o. female.  HPI She presents for evaluation of shortness of breath for 1 week, worsening despite using her usual medications.  She is recovering from [place], diagnosed 3 weeks ago, at that time was treated with a steroid injection, and symptomatic treatment.  She denies fever.  She decided come here because her breathing is getting worse and she has pain in the left lateral chest which radiates to the left upper back.  This is worse with deep breathing.  She is not producing mucus or sputum with coughing.  She denies fever, chills, nausea, vomiting, leg pain, weakness or dizziness.  She has 2 children at home who are ill with influenza, currently.  There are no other known active modifying factors.    Past Medical History:  Diagnosis Date   Allergic rhinitis    Anemia    Anxiety    Asthma    B12 deficiency with anti-parietal cell antibodies + 03/25/2011   New 03/2011    Chronic constipation    Constipation    Depression    GERD (gastroesophageal reflux disease)    H. pylori infection 2008   Hx of tx + serology   Iron deficiency anemia 08/06/2011   Hgb 10.2 and MCV 79 on health screen labs    Irritable bowel syndrome    Obesity    Palpitations    Panic attack    Shortness of breath    Ulcer     Patient Active Problem List   Diagnosis Date Noted   Menorrhagia 04/29/2021   Acute bronchitis 12/25/2020   Epistaxis 07/10/2020   Hamstring strain, right, subsequent encounter 07/18/2019   Numbness 06/29/2019   Low back pain with sciatica 06/29/2019   Mid back pain 04/21/2019   Concussion with no loss of consciousness 04/21/2019   Nonallopathic lesion of cervical region 06/14/2018   Sternoclavicular joint pain, right 06/14/2018   Trapezius  muscle spasm 05/22/2017   Knee MCL sprain 11/13/2016   Depression 11/10/2016   Class 2 obesity without serious comorbidity with body mass index (BMI) of 39.0 to 39.9 in adult 10/14/2016   Nonallopathic lesion of sacral region 10/10/2016   Panic attack 08/15/2016   Coccydynia 06/10/2016   Vasovagal syncope 03/14/2016   RUQ pain 03/14/2016   Vitamin D deficiency 11/22/2015   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   Nonallopathic lesion of thoracic region 02/21/2015   Nonallopathic lesion of lumbosacral region 02/21/2015   Nonallopathic lesion-rib cage 02/21/2015   Piriformis syndrome of left side 02/21/2015   Cervicalgia 01/22/2015   Obesity (BMI 35.0-39.9 without comorbidity) 01/22/2015   Muscle tension headache 01/22/2015   Iron deficiency anemia due to chronic blood loss - menses 01/03/2015   Chronic chest wall pain - mostly left side 01/03/2015   Well adult exam 11/13/2014   B12 deficiency 11/13/2014   Acromioclavicular joint separation, type 1 07/12/2014   Trochanteric bursitis of left hip 06/20/2014   Patellofemoral pain syndrome 04/07/2014   Acute medial meniscal tear 01/25/2014   Weight gain 11/18/2013   Anemia, iron deficiency 11/18/2013   Acute sinusitis 11/05/2013   Abdominal pain, left upper quadrant-musculoskeletal 05/13/2013   Iron deficiency anemia 08/06/2011   Arthralgia 03/24/2011   INSOMNIA, CHRONIC 10/11/2010   IBS 04/07/2008  Palpitations 01/11/2008   Generalized anxiety disorder 11/25/2007   Allergic rhinitis 11/25/2007   GERD 11/25/2007   SINUSITIS, CHRONIC 04/03/2007   Asthma 04/03/2007    Past Surgical History:  Procedure Laterality Date   FLEXIBLE SIGMOIDOSCOPY  04/14/2008   normal   NASAL SINUS SURGERY     UPPER GASTROINTESTINAL ENDOSCOPY       OB History     Gravida  4   Para  3   Term  3   Preterm      AB  1   Living  3      SAB  1   IAB      Ectopic      Multiple      Live Births              Family History  Problem  Relation Age of Onset   Diabetes Unknown        grandmother   Hypertension Unknown    Hypertension Mother    Anxiety disorder Mother    Obesity Mother    Hypertension Father    Diabetes Maternal Aunt    Diabetes Maternal Aunt    Diabetes Paternal Grandmother    Colon polyps Paternal Uncle        x4   Colon cancer Neg Hx     Social History   Tobacco Use   Smoking status: Never   Smokeless tobacco: Never  Vaping Use   Vaping Use: Never used  Substance Use Topics   Alcohol use: Yes    Alcohol/week: 0.0 standard drinks    Comment: Social maybe twice amonth, beer   Drug use: No    Home Medications Prior to Admission medications   Medication Sig Start Date End Date Taking? Authorizing Provider  oseltamivir (TAMIFLU) 75 MG capsule Take 1 capsule (75 mg total) by mouth every 12 (twelve) hours. 07/04/21  Yes Daleen Bo, MD  albuterol (VENTOLIN HFA) 108 (90 Base) MCG/ACT inhaler INHALE 1 TO 2 PUFFS INTO THE LUNGS EVERY 6 HOURS AS NEEDED FOR WHEEZING OR SHORTNESS OF BREATH 01/24/21 01/24/22  Plotnikov, Evie Lacks, MD  budesonide-formoterol (SYMBICORT) 160-4.5 MCG/ACT inhaler Inhale 2 puffs into the lungs 2 (two) times daily. 06/06/21   Plotnikov, Evie Lacks, MD  cetirizine (ZYRTEC) 10 MG tablet Take 1 tablet (10 mg total) by mouth daily. 11/05/18   Marrian Salvage, FNP  Cholecalciferol (VITAMIN D3) 50 MCG (2000 UT) capsule Take 2 capsules (4,000 Units total) by mouth daily. 07/10/20   Plotnikov, Evie Lacks, MD  clobetasol (TEMOVATE) 0.05 % external solution Apply to affected areas up to 2 times a day as needed * Not to Face, Groin, or Axilla) 06/14/21     cyanocobalamin (,VITAMIN B-12,) 1000 MCG/ML injection INJECT 1 ML INTO THE MUSCLE EVERY 14 DAYS 07/10/20 07/10/21  Plotnikov, Evie Lacks, MD  EPINEPHrine (EPIPEN 2-PAK) 0.3 mg/0.3 mL IJ SOAJ injection Inject 1 pen (0.3 mg) into the muscle as needed for anaphylaxis. 03/14/21   Plotnikov, Evie Lacks, MD  ferrous sulfate 325 (65 FE) MG tablet  Take 1 tablet (325 mg total) by mouth daily. 01/26/21 01/26/22  Plotnikov, Evie Lacks, MD  iron polysaccharides (NU-IRON) 150 MG capsule Take 1 capsule by mouth daily. 04/29/21   Biagio Borg, MD  montelukast (SINGULAIR) 10 MG tablet Take 1 tablet (10 mg total) by mouth at bedtime. 06/13/21 07/13/21  Lynden Oxford Scales, PA-C    Allergies    Augmentin [amoxicillin-pot clavulanate], Cephalexin, and Iodine  Review of  Systems   Review of Systems  All other systems reviewed and are negative.  Physical Exam Updated Vital Signs BP (!) 146/82   Pulse 98   Temp (!) 97.1 F (36.2 C) (Oral)   Resp 20   LMP 06/09/2021   SpO2 98%   Physical Exam Vitals and nursing note reviewed.  Constitutional:      General: She is not in acute distress.    Appearance: She is well-developed. She is not ill-appearing, toxic-appearing or diaphoretic.  HENT:     Head: Normocephalic and atraumatic.     Right Ear: External ear normal.     Left Ear: External ear normal.  Eyes:     Conjunctiva/sclera: Conjunctivae normal.     Pupils: Pupils are equal, round, and reactive to light.  Neck:     Trachea: Phonation normal.  Cardiovascular:     Rate and Rhythm: Normal rate.  Pulmonary:     Effort: Pulmonary effort is normal. No respiratory distress.     Breath sounds: No stridor.  Abdominal:     General: There is no distension.     Palpations: Abdomen is soft.     Tenderness: There is no abdominal tenderness.  Musculoskeletal:        General: Normal range of motion.     Cervical back: Normal range of motion and neck supple.  Skin:    General: Skin is warm and dry.  Neurological:     Mental Status: She is alert and oriented to person, place, and time.     Cranial Nerves: No cranial nerve deficit.     Sensory: No sensory deficit.     Motor: No abnormal muscle tone.     Coordination: Coordination normal.  Psychiatric:        Mood and Affect: Mood normal.        Behavior: Behavior normal.        Thought  Content: Thought content normal.        Judgment: Judgment normal.    ED Results / Procedures / Treatments   Labs (all labs ordered are listed, but only abnormal results are displayed) Labs Reviewed  CBC WITH DIFFERENTIAL/PLATELET - Abnormal; Notable for the following components:      Result Value   Hemoglobin 10.3 (*)    HCT 34.6 (*)    MCV 78.5 (*)    MCH 23.4 (*)    MCHC 29.8 (*)    Eosinophils Absolute 0.7 (*)    All other components within normal limits  BASIC METABOLIC PANEL - Abnormal; Notable for the following components:   Glucose, Bld 107 (*)    Calcium 8.8 (*)    All other components within normal limits  RESPIRATORY PANEL BY PCR  I-STAT BETA HCG BLOOD, ED ([facility], [facility], [facility] ONLY)  TROPONIN I (HIGH SENSITIVITY)  TROPONIN I (HIGH SENSITIVITY)    EKG EKG Interpretation  Date/Time:  Thursday July 04 2021 10:47:44 EDT Ventricular Rate:  96 PR Interval:  135 QRS Duration: 89 QT Interval:  338 QTC Calculation: 428 R Axis:   36 Text Interpretation: Sinus rhythm Confirmed by Regan Lemming ([id number]) on 07/04/2021 11:26:10 AM  Radiology DG Chest 1 View  Result Date: 07/04/2021 CLINICAL DATA:  Chest pain, shortness of breath. EXAM: CHEST  1 VIEW COMPARISON:  October 14, 2019. FINDINGS: The heart size and mediastinal contours are within normal limits. Both lungs are clear. The visualized skeletal structures are unremarkable. IMPRESSION: No active disease. Electronically Signed   By: Sabino Dick  Jr M.D.   On: 07/04/2021 11:41   CT Angio Chest PE W/Cm &/Or Wo Cm  Result Date: 07/04/2021 CLINICAL DATA:  Chest pain, high prob PE suspected, recent COVID EXAM: CT ANGIOGRAPHY CHEST WITH CONTRAST TECHNIQUE: Multidetector CT imaging of the chest was performed using the standard protocol during bolus administration of intravenous contrast. Multiplanar CT image reconstructions and MIPs were obtained to evaluate the vascular anatomy. CONTRAST:  69mL OMNIPAQUE IOHEXOL 350 MG/ML SOLN  COMPARISON:  07/20/2018 FINDINGS: Cardiovascular: Heart size normal. No pericardial effusion. The RV is nondilated. Satisfactory opacification of pulmonary arteries noted, and there is no evidence of pulmonary emboli. Adequate contrast opacification of the thoracic aorta with no evidence of dissection, aneurysm, or stenosis. There is bovine variant brachiocephalic arch anatomy without proximal stenosis. No significant atheromatous change. Mediastinum/Nodes: No mass or adenopathy. Lungs/Pleura: No pleural effusion. No pneumothorax. Lungs are clear. Upper Abdomen: No acute findings. Musculoskeletal: No chest wall abnormality. No acute or significant osseous findings. Review of the MIP images confirms the above findings. IMPRESSION: Negative Electronically Signed   By: Lucrezia Europe M.D.   On: 07/04/2021 19:11    Procedures Procedures   Medications Ordered in ED Medications  oseltamivir (TAMIFLU) capsule 75 mg (has no administration in time range)  albuterol (PROVENTIL) (2.5 MG/3ML) 0.083% nebulizer solution 5 mg (5 mg Nebulization Given 07/04/21 1823)  ipratropium (ATROVENT) nebulizer solution 0.5 mg (0.5 mg Nebulization Given 07/04/21 1823)  predniSONE (DELTASONE) tablet 60 mg (60 mg Oral Given 07/04/21 1823)  iohexol (OMNIPAQUE) 350 MG/ML injection 80 mL (80 mLs Intravenous Contrast Given 07/04/21 1840)    ED Course  I have reviewed the triage vital signs and the nursing notes.  Pertinent labs & imaging results that were available during my care of the patient were reviewed by me and considered in my medical decision making (see chart for details).    MDM Rules/Calculators/A&P                            Patient Vitals for the past 24 hrs:  BP Temp Temp src Pulse Resp SpO2  07/04/21 2200 (!) 146/82 -- -- 98 20 98 %  07/04/21 2105 (!) 155/84 -- -- 89 20 98 %  07/04/21 2000 (!) 146/126 -- -- 89 13 100 %  07/04/21 1930 126/63 -- -- 87 14 100 %  07/04/21 1830 121/77 -- -- 88 18 100 %   07/04/21 1745 118/90 -- -- 90 18 99 %  07/04/21 1045 (!) 120/100 (!) 97.1 F (36.2 C) Oral 99 18 99 %     Reevaluation with update and discussion. After initial assessment and treatment, an updated evaluation reveals no change in clinical status, findings discussed with the patient all questions answered.  She agrees to start Tamiflu empirically.Daleen Bo   Medical Decision Making:  This patient is presenting for evaluation of dyspnea and chest pain, which does require a range of treatment options, and is a complaint that involves a high risk of morbidity and mortality. The differential diagnoses include complications from COVID, exacerbation of asthma, influenza. I decided to review old records, and in summary middle-aged female, presenting with symptoms following COVID infection, complicated by history of asthma.  I did not require additional historical information from anyone.  Clinical Laboratory Tests Ordered, included CBC, Metabolic panel, Pregnancy test, and delta troponin . Review indicates initial findings are reassuring with mild glucose elevation, calcium low, hemoglobin low, and MCV low.  Viral  panel pending at discharge. Radiologic Tests Ordered, included chest x-ray.  I independently Visualized: Radiograph images, which show no infiltrate or edema   Critical Interventions-clinical evaluation, laboratory testing, radiography, observation, medication treatment, discussion with patient  After These Interventions, the Patient was reevaluated and was found stable for discharge.  Suspect influenza infection.  Delay for return labs so patient treated and discharged with prescription for Tamiflu.  Doubt complication of COVID including ongoing respiratory distress, PE or pneumonia.  CRITICAL CARE-no Performed by: Daleen Bo  Nursing Notes Reviewed/ Care Coordinated Applicable Imaging Reviewed Interpretation of Laboratory Data incorporated into ED treatment  The patient  appears reasonably screened and/or stabilized for discharge and I doubt any other medical condition or other [facility] requiring further screening, evaluation, or treatment in the ED at this time prior to discharge.  Plan: Home Medications-symptomatic treatment as needed, continue routine medication; Home Treatments-rest, fluids; return here if the recommended treatment, does not improve the symptoms; Recommended follow up-PCP, PRN     Final Clinical Impression(s) / ED Diagnoses Final diagnoses:  Viral illness    Rx / DC Orders ED Discharge Orders          Ordered    oseltamivir (TAMIFLU) 75 MG capsule  Every 12 hours        07/04/21 2244             Daleen Bo, MD 07/04/21 2247

## 2021-07-05 LAB — RESPIRATORY PANEL BY PCR

## 2021-07-15 DIAGNOSIS — Z0289 Encounter for other administrative examinations: Secondary | ICD-10-CM

## 2021-07-23 ENCOUNTER — Encounter: Payer: Self-pay | Admitting: Oncology

## 2021-07-23 ENCOUNTER — Other Ambulatory Visit: Payer: Self-pay

## 2021-07-23 ENCOUNTER — Encounter (INDEPENDENT_AMBULATORY_CARE_PROVIDER_SITE_OTHER): Payer: Self-pay | Admitting: Family Medicine

## 2021-07-23 ENCOUNTER — Ambulatory Visit (INDEPENDENT_AMBULATORY_CARE_PROVIDER_SITE_OTHER): Payer: No Typology Code available for payment source | Admitting: Family Medicine

## 2021-07-23 VITALS — BP 110/73 | HR 90 | Temp 98.1°F | Ht 64.0 in | Wt 243.0 lb

## 2021-07-23 DIAGNOSIS — R5383 Other fatigue: Secondary | ICD-10-CM | POA: Diagnosis not present

## 2021-07-23 DIAGNOSIS — D508 Other iron deficiency anemias: Secondary | ICD-10-CM

## 2021-07-23 DIAGNOSIS — R0602 Shortness of breath: Secondary | ICD-10-CM

## 2021-07-23 DIAGNOSIS — E559 Vitamin D deficiency, unspecified: Secondary | ICD-10-CM

## 2021-07-23 DIAGNOSIS — F32A Depression, unspecified: Secondary | ICD-10-CM | POA: Diagnosis not present

## 2021-07-23 DIAGNOSIS — F419 Anxiety disorder, unspecified: Secondary | ICD-10-CM

## 2021-07-23 DIAGNOSIS — E538 Deficiency of other specified B group vitamins: Secondary | ICD-10-CM | POA: Diagnosis not present

## 2021-07-23 DIAGNOSIS — Z6841 Body Mass Index (BMI) 40.0 and over, adult: Secondary | ICD-10-CM

## 2021-07-23 DIAGNOSIS — R7303 Prediabetes: Secondary | ICD-10-CM

## 2021-07-24 LAB — ANEMIA PANEL
Ferritin: 18 ng/mL (ref 15–150)
Folate, Hemolysate: 601 ng/mL
Folate, RBC: 1674 ng/mL (ref >498)
Hematocrit: 35.9 % (ref 34.0–46.6)
Iron Saturation: 8 % — CL (ref 15–55)
Iron: 35 ug/dL (ref 27–159)
Retic Ct Pct: 1.6 % (ref 0.6–2.6)
Total Iron Binding Capacity: 442 ug/dL (ref 250–450)
UIBC: 407 ug/dL (ref 131–425)
Vitamin B-12: 462 pg/mL (ref 232–1245)

## 2021-07-24 LAB — LIPID PANEL WITH LDL/HDL RATIO
Cholesterol, Total: 219 mg/dL — ABNORMAL HIGH (ref 100–199)
HDL: 53 mg/dL (ref >39)
LDL Chol Calc (NIH): 132 mg/dL — ABNORMAL HIGH (ref 0–99)
LDL/HDL Ratio: 2.5 ratio (ref 0.0–3.2)
Triglycerides: 194 mg/dL — ABNORMAL HIGH (ref 0–149)
VLDL Cholesterol Cal: 34 mg/dL (ref 5–40)

## 2021-07-24 LAB — COMPREHENSIVE METABOLIC PANEL
ALT: 16 IU/L (ref 0–32)
AST: 22 IU/L (ref 0–40)
Albumin/Globulin Ratio: 1.3 (ref 1.2–2.2)
Albumin: 4.4 g/dL (ref 3.8–4.8)
Alkaline Phosphatase: 78 IU/L (ref 44–121)
BUN/Creatinine Ratio: 16 (ref 9–23)
BUN: 12 mg/dL (ref 6–20)
Bilirubin Total: 0.3 mg/dL (ref 0.0–1.2)
CO2: 25 mmol/L (ref 20–29)
Calcium: 9.5 mg/dL (ref 8.7–10.2)
Chloride: 98 mmol/L (ref 96–106)
Creatinine, Ser: 0.74 mg/dL (ref 0.57–1.00)
Globulin, Total: 3.4 g/dL (ref 1.5–4.5)
Glucose: 79 mg/dL (ref 70–99)
Potassium: 4.4 mmol/L (ref 3.5–5.2)
Sodium: 136 mmol/L (ref 134–144)
Total Protein: 7.8 g/dL (ref 6.0–8.5)
eGFR: 105 mL/min/{1.73_m2} (ref >59)

## 2021-07-24 LAB — T4, FREE: Free T4: 1.33 ng/dL (ref 0.82–1.77)

## 2021-07-24 LAB — HEMOGLOBIN A1C
Est. average glucose Bld gHb Est-mCnc: 114 mg/dL
Hgb A1c MFr Bld: 5.6 % (ref 4.8–5.6)

## 2021-07-24 LAB — INSULIN, RANDOM: INSULIN: 10.4 u[IU]/mL (ref 2.6–24.9)

## 2021-07-24 LAB — TSH: TSH: 1.52 u[IU]/mL (ref 0.450–4.500)

## 2021-07-24 LAB — VITAMIN D 25 HYDROXY (VIT D DEFICIENCY, FRACTURES): Vit D, 25-Hydroxy: 52.2 ng/mL (ref 30.0–100.0)

## 2021-07-24 LAB — T3: T3, Total: 130 ng/dL (ref 71–180)

## 2021-07-24 NOTE — Progress Notes (Signed)
Dear Dr. Alain Marion,   Thank you for referring Deanna Schwartz to our clinic. The following note includes my evaluation and treatment recommendations.  Chief Complaint:   OBESITY Deanna Schwartz (MR# [id number]) is a 40 y.o. female who presents for evaluation and treatment of obesity and related comorbidities. Current BMI is Body mass index is 41.71 kg/m. Deanna Schwartz has been struggling with her weight for many years and has been unsuccessful in either losing weight, maintaining weight loss, or reaching her healthy weight goal.  Deanna Schwartz is currently in the action stage of change and ready to dedicate time achieving and maintaining a healthier weight. Deanna Schwartz is interested in becoming our patient and working on intensive lifestyle modifications including (but not limited to) diet and exercise for weight loss.  Deanna Schwartz was referred by Dr. Alain Marion. She is a returning pt. Pt is skipping lunch due to lack of hunger. Coffee in the morning (1 tsp), sandwich with 2 eggs, 1 slice cheese, and [place] slices; She may have a snack on cheese or cheese and crackers, otherwise will wait until dinner. Dinner is 1 cup rice, 1 cup beans, 3-4 oz salmon, lettuce, tomato, cucumber salad (satisfied).  Deanna Schwartz's habits were reviewed today and are as follows: Her family eats meals together, she thinks her family will eat healthier with her, her desired weight loss is 83 lbs, she has been heavy most of her life, she started gaining weight in 2020, her heaviest weight ever was 245 pounds, she snacks frequently in the evenings, she skips meals frequently, she is frequently drinking liquids with calories, and she struggles with emotional eating.  Depression Screen Deanna Schwartz's Food and Mood (modified PHQ-9) score was 10.  Depression screen PHQ 2/9 07/23/2021  Decreased Interest 2  Down, Depressed, Hopeless 1  PHQ - 2 Score 3  Altered sleeping 1  Tired, decreased energy 3  Change in appetite 1   Feeling bad or failure about yourself  1  Trouble concentrating 1  Moving slowly or fidgety/restless 0  Suicidal thoughts 0  PHQ-9 Score 10  Difficult doing work/chores Not difficult at all  Some recent data might be hidden   Subjective:   1. Other fatigue Deanna Schwartz admits to daytime somnolence and admits to waking up still tired. Patent has a history of symptoms of daytime fatigue and morning fatigue. Deanna Schwartz generally gets  5-7  hours of sleep per night, and states that she has generally restful sleep. Snoring is present. Apneic episodes are present. Epworth Sleepiness Score is 6. EKG normal sinus rhythm at 96 bpm (done 07/05/2021).  2. SOB (shortness of breath) Deanna Schwartz notes increasing shortness of breath with exercising and seems to be worsening over time with weight gain. She notes getting out of breath sooner with activity than she used to. This has gotten worse recently. [place]aii denies shortness of breath at rest or orthopnea.  3. Other iron deficiency anemia Deanna Schwartz is on PO iron daily. Her last MCV was 75 and hemoglobin and hematocrit are slightly low.  4. Vitamin B12 deficiency Pt is on OTC B12 supplement. Her last B12 was within normal limits.  5. Vitamin D deficiency Deanna Schwartz is on OTC Vit D 5,000 IU daily and reports fatigue.  6. Pre-diabetes Diagnosed many years ago (2015). She is not on medication.  7. Anxiety and depression Deanna Schwartz went to see a Social worker. She is not on medication. Symptoms are well controlled.  Assessment/Plan:   1. Other fatigue Meggen does feel that her weight is causing her  energy to be lower than it should be. Fatigue may be related to obesity, depression or many other causes. Labs will be ordered, and in the meanwhile, Jaimarie will focus on self care including making healthy food choices, increasing physical activity and focusing on stress reduction. Check labs today.  - TSH - T4, free - T3 - Comprehensive metabolic  panel  2. SOB (shortness of breath) Chinelo does feel that she gets out of breath more easily that she used to when she exercises. Jannine's shortness of breath appears to be obesity related and exercise induced. She has agreed to work on weight loss and gradually increase exercise to treat her exercise induced shortness of breath. Will continue to monitor closely. Check labs today.  - Lipid Panel With LDL/HDL Ratio  3. Other iron deficiency anemia Orders and follow up as documented in patient record.  Counseling Iron is essential for our bodies to make red blood cells.  Reasons that someone may be deficient include: an iron-deficient diet (more likely in those following vegan or vegetarian diets), women with heavy menses, patients with GI disorders or poor absorption, patients that have had bariatric surgery, frequent blood donors, patients with cancer, and patients with heart disease.   Iron-rich foods include dark leafy greens, red and white meats, eggs, seafood, and beans.   Certain foods and drinks prevent your body from absorbing iron properly. Avoid eating these foods in the same meal as iron-rich foods or with iron supplements. These foods include: coffee, black tea, and red wine; milk, dairy products, and foods that are high in calcium; beans and soybeans; whole grains.  Constipation can be a side effect of iron supplementation. Increased water and fiber intake are helpful. Water goal: > 2 liters/day. Fiber goal: > 25 grams/day. Check labs today.  - Anemia panel  4. Vitamin B12 deficiency The diagnosis was reviewed with the patient. Counseling provided today, see below. We will continue to monitor. Orders and follow up as documented in patient record.  Counseling The body needs vitamin B12: to make red blood cells; to make DNA; and to help the nerves work properly so they can carry messages from the brain to the body.  The main causes of vitamin B12 deficiency include dietary  deficiency, digestive diseases, pernicious anemia, and having a surgery in which part of the stomach or small intestine is removed.  Certain medicines can make it harder for the body to absorb vitamin B12. These medicines include: heartburn medications; some antibiotics; some medications used to treat diabetes, gout, and high cholesterol.  In some cases, there are no symptoms of this condition. If the condition leads to anemia or nerve damage, various symptoms can occur, such as weakness or fatigue, shortness of breath, and numbness or tingling in your hands and feet.   Treatment:  May include taking vitamin B12 supplements.  Avoid alcohol.  Eat lots of healthy foods that contain vitamin B12: Beef, pork, chicken, [place], and organ meats, such as liver.  Seafood: This includes clams, rainbow trout, salmon, tuna, and haddock. Eggs.  Cereal and dairy products that are fortified: This means that vitamin B12 has been added to the food.   5. Vitamin D deficiency Low Vitamin D level contributes to fatigue and are associated with obesity, breast, and colon cancer. She agrees to continue to take OTC Vitamin D 5,000 IU daily and will follow-up for routine testing of Vitamin D, at least 2-3 times per year to avoid over-replacement. Check labs today.  - VITAMIN  D 25 Hydroxy (Vit-D Deficiency, Fractures)  6. Pre-diabetes Deanna Schwartz will continue to work on weight loss, exercise, and decreasing simple carbohydrates to help decrease the risk of diabetes. Check labs today.  - Hemoglobin A1c - Insulin, random  7. Anxiety and depression Behavior modification techniques were discussed today to help Deanna Schwartz deal with her anxiety.  Orders and follow up as documented in patient record. Follow up at next appt.  8. Obesity with current BMI of 41.7  Deanna Schwartz is currently in the action stage of change and her goal is to continue with weight loss efforts. I recommend Deanna Schwartz begin the structured treatment plan  as follows:  She has agreed to the Category 3 Plan with 8 oz protein.  Exercise goals: No exercise has been prescribed at this time.   Behavioral modification strategies: increasing lean protein intake, meal planning and cooking strategies, keeping healthy foods in the home, holiday eating strategies , and planning for success.  She was informed of the importance of frequent follow-up visits to maximize her success with intensive lifestyle modifications for her multiple health conditions. She was informed we would discuss her lab results at her next visit unless there is a critical issue that needs to be addressed sooner. Deanna Schwartz agreed to keep her next visit at the agreed upon time to discuss these results.  Objective:   Blood pressure 110/73, pulse 90, temperature 98.1 F (36.7 C), height 5\' 4"  (1.626 m), weight 243 lb (110.2 kg), last menstrual period 07/08/2021, SpO2 98 %. Body mass index is 41.71 kg/m.  EKG: Normal sinus rhythm, rate 96 (done 07/05/2021).  Indirect Calorimeter completed today shows a VO2 of 236 and a REE of 1627.  Her calculated basal metabolic rate is 6503 thus her basal metabolic rate is worse than expected.  General: Cooperative, alert, well developed, in no acute distress. HEENT: Conjunctivae and lids unremarkable. Cardiovascular: Regular rhythm.  Lungs: Normal work of breathing. Neurologic: No focal deficits.   Lab Results  Component Value Date   CREATININE 0.74 07/23/2021   BUN 12 07/23/2021   NA 136 07/23/2021   K 4.4 07/23/2021   CL 98 07/23/2021   CO2 25 07/23/2021   Lab Results  Component Value Date   ALT 16 07/23/2021   AST 22 07/23/2021   ALKPHOS 78 07/23/2021   BILITOT 0.3 07/23/2021   Lab Results  Component Value Date   HGBA1C 5.6 07/23/2021   HGBA1C 5.6 01/25/2021   HGBA1C 5.7 09/17/2020   HGBA1C 5.7 12/16/2016   HGBA1C 5.3 08/26/2016   Lab Results  Component Value Date   INSULIN WILL FOLLOW 07/23/2021   INSULIN 13.0  08/26/2016   Lab Results  Component Value Date   TSH 1.520 07/23/2021   Lab Results  Component Value Date   CHOL 219 (H) 07/23/2021   HDL 53 07/23/2021   LDLCALC 132 (H) 07/23/2021   LDLDIRECT 86.0 10/13/2017   TRIG 194 (H) 07/23/2021   CHOLHDL 4 01/25/2021   Lab Results  Component Value Date   WBC 6.9 07/04/2021   HGB 10.3 (L) 07/04/2021   HCT 35.9 07/23/2021   MCV 78.5 (L) 07/04/2021   PLT 301 07/04/2021   Lab Results  Component Value Date   IRON 35 07/23/2021   TIBC 442 07/23/2021   FERRITIN 18 07/23/2021    Attestation Statements:   Reviewed by clinician on day of visit: allergies, medications, problem list, medical history, surgical history, family history, social history, and previous encounter notes.  Coral Ceo, CMA,  am acting as transcriptionist for Coralie Common, MD.   This is the patient's first visit at [facility]. The patient's NEW PATIENT PACKET was reviewed at length. Included in the packet: current and past health history, medications, allergies, ROS, gynecologic history (women only), surgical history, family history, social history, weight history, weight loss surgery history (for those that have had weight loss surgery), nutritional evaluation, mood and food questionnaire, PHQ9, Epworth questionnaire, sleep habits questionnaire, patient life and health improvement goals questionnaire. These will all be scanned into the patient's chart under media.   During the visit, I independently reviewed the patient's EKG, bioimpedance scale results, and indirect calorimeter results. I used this information to tailor a meal plan for the patient that will help her to lose weight and will improve her obesity-related conditions going forward. I performed a medically necessary appropriate examination and/or evaluation. I discussed the assessment and treatment plan with the patient. The patient was provided an opportunity to ask questions and all  were answered. The patient agreed with the plan and demonstrated an understanding of the instructions. Labs were ordered at this visit and will be reviewed at the next visit unless more critical results need to be addressed immediately. Clinical information was updated and documented in the EMR.   Time spent on visit including pre-visit chart review and post-visit care was 40 minutes.   A separate 15 minutes was spent on risk counseling (see above).  I have reviewed the above documentation for accuracy and completeness, and I agree with the above. - Coralie Common, MD

## 2021-08-05 ENCOUNTER — Telehealth: Payer: Self-pay | Admitting: Internal Medicine

## 2021-08-05 NOTE — Telephone Encounter (Signed)
Medication was not prescribe Dr. Alain Marion. Called pharmacy spoke w/ pharmacist verified the prescribing provider which was Dr. Lynden Oxford. Inform her MD is not in our practice, and med was not rx by Dr. Alain Marion. Will need to go back to the prescribing provider.Marland KitchenJohny Chess

## 2021-08-05 NOTE — Telephone Encounter (Signed)
Pharmacy called and needing clarification on Millipred. States medication is expensive and wondering if Prednisolone could be called in, instead.   Please advise.    [place], [place] Phone:  [phone]  Fax:  [phone]

## 2021-08-06 ENCOUNTER — Encounter (INDEPENDENT_AMBULATORY_CARE_PROVIDER_SITE_OTHER): Payer: Self-pay | Admitting: Family Medicine

## 2021-08-06 ENCOUNTER — Other Ambulatory Visit (HOSPITAL_COMMUNITY): Payer: Self-pay

## 2021-08-06 ENCOUNTER — Other Ambulatory Visit: Payer: Self-pay

## 2021-08-06 ENCOUNTER — Ambulatory Visit (INDEPENDENT_AMBULATORY_CARE_PROVIDER_SITE_OTHER): Payer: No Typology Code available for payment source | Admitting: Family Medicine

## 2021-08-06 VITALS — BP 118/82 | HR 83 | Temp 98.3°F | Ht 64.0 in | Wt 245.0 lb

## 2021-08-06 DIAGNOSIS — F419 Anxiety disorder, unspecified: Secondary | ICD-10-CM | POA: Diagnosis not present

## 2021-08-06 DIAGNOSIS — R7303 Prediabetes: Secondary | ICD-10-CM | POA: Diagnosis not present

## 2021-08-06 DIAGNOSIS — Z6841 Body Mass Index (BMI) 40.0 and over, adult: Secondary | ICD-10-CM

## 2021-08-06 DIAGNOSIS — D509 Iron deficiency anemia, unspecified: Secondary | ICD-10-CM

## 2021-08-06 DIAGNOSIS — F32A Depression, unspecified: Secondary | ICD-10-CM

## 2021-08-06 DIAGNOSIS — E7849 Other hyperlipidemia: Secondary | ICD-10-CM | POA: Diagnosis not present

## 2021-08-06 MED ORDER — SERTRALINE HCL 50 MG PO TABS
25.0000 mg | ORAL_TABLET | Freq: Every day | ORAL | 0 refills | Status: DC
  Filled 2021-08-06: qty 30, 60d supply, fill #0

## 2021-08-07 NOTE — Progress Notes (Signed)
Chief Complaint:   OBESITY Deanna Schwartz is here to discuss her progress with her obesity treatment plan along with follow-up of her obesity related diagnoses. Deanna Schwartz is on the Category 3 Plan + 8 oz protein and states she is following her eating plan approximately 90% of the time. Deanna Schwartz states she is doing 10,000 steps 3 times per week.  Today's visit was #: 2 Starting weight: 243 lbs Starting date: 07/23/2021 Today's weight: 245 lbs Today's date: 08/06/2021 Total lbs lost to date: 0 Total lbs lost since last in-office visit: 0  Interim History: Returning pt for first follow up. Deanna Schwartz cooked for Thanksgiving and everyone else brought a plate. She felt this first 2 weeks was easier than her first time at the clinic. She denies hunger or cravings. Pt did drink some wine.  Subjective:   1. Anxiety and depression Pt has had 1 panic attack since her last appt. She has more stress during the holiday season.  2. Iron deficiency anemia, unspecified iron deficiency anemia type Iron and ferritin are within normal limits. Pt is not on iron supplementation.  3. Other hyperlipidemia Her LDL is 132, triglycerides 194, and HDL 53. She is not on statin therapy. Age is below ability to risk stratify.  4. Prediabetes Pt's A1c is 5.6 and insulin level 10.4. She is not on medication and reports some higher drive for carbohydrates.  Assessment/Plan:   1. Anxiety and depression Deanna Schwartz will start Zoloft 25 mg as prescribed. Behavior modification techniques were discussed today to help Deanna Schwartz deal with her anxiety.  Orders and follow up as documented in patient record.   Start- sertraline (ZOLOFT) 50 MG tablet; Take 1/2 tablet (25 mg total) by mouth daily.  Dispense: 30 tablet; Refill: 0  2. Iron deficiency anemia, unspecified iron deficiency anemia type Orders and follow up as documented in patient record. Pt will start a prenatal vitamin. [place] list  provided.  Counseling Iron is essential for our bodies to make red blood cells.  Reasons that someone may be deficient include: an iron-deficient diet (more likely in those following vegan or vegetarian diets), women with heavy menses, patients with GI disorders or poor absorption, patients that have had bariatric surgery, frequent blood donors, patients with cancer, and patients with heart disease.   Iron-rich foods include dark leafy greens, red and white meats, eggs, seafood, and beans.   Certain foods and drinks prevent your body from absorbing iron properly. Avoid eating these foods in the same meal as iron-rich foods or with iron supplements. These foods include: coffee, black tea, and red wine; milk, dairy products, and foods that are high in calcium; beans and soybeans; whole grains.  Constipation can be a side effect of iron supplementation. Increased water and fiber intake are helpful. Water goal: > 2 liters/day. Fiber goal: > 25 grams/day.  3. Other hyperlipidemia Cardiovascular risk and specific lipid/LDL goals reviewed.  We discussed several lifestyle modifications today and [place]chid will continue to work on diet, exercise and weight loss efforts. Orders and follow up as documented in patient record. Repeat labs in 3 months.  Counseling Intensive lifestyle modifications are the first line treatment for this issue. Dietary changes: Increase soluble fiber. Decrease simple carbohydrates. Exercise changes: Moderate to vigorous-intensity aerobic activity 150 minutes per week if tolerated. Lipid-lowering medications: see documented in medical record.  4. Prediabetes Adeola will continue to work on weight loss, exercise, and decreasing simple carbohydrates to help decrease the risk of diabetes. Follow up labs  in 3 months. No meds at this time.  5. Obesity with current BMI of 42.2  Deanna Schwartz is currently in the action stage of change. As such, her goal is to continue with weight loss  efforts. She has agreed to the Category 3 Plan + 8 oz protein.   Exercise goals: All adults should avoid inactivity. Some physical activity is better than none, and adults who participate in any amount of physical activity gain some health benefits.  Behavioral modification strategies: increasing lean protein intake, meal planning and cooking strategies, keeping healthy foods in the home, and planning for success.  Deanna Schwartz has agreed to follow-up with our clinic in 2 weeks. She was informed of the importance of frequent follow-up visits to maximize her success with intensive lifestyle modifications for her multiple health conditions.   Objective:   Blood pressure 118/82, pulse 83, temperature 98.3 F (36.8 C), height 5\' 4"  (1.626 m), weight 245 lb (111.1 kg), last menstrual period 07/10/2021, SpO2 100 %. Body mass index is 42.05 kg/m.  General: Cooperative, alert, well developed, in no acute distress. HEENT: Conjunctivae and lids unremarkable. Cardiovascular: Regular rhythm.  Lungs: Normal work of breathing. Neurologic: No focal deficits.   Lab Results  Component Value Date   CREATININE 0.74 07/23/2021   BUN 12 07/23/2021   NA 136 07/23/2021   K 4.4 07/23/2021   CL 98 07/23/2021   CO2 25 07/23/2021   Lab Results  Component Value Date   ALT 16 07/23/2021   AST 22 07/23/2021   ALKPHOS 78 07/23/2021   BILITOT 0.3 07/23/2021   Lab Results  Component Value Date   HGBA1C 5.6 07/23/2021   HGBA1C 5.6 01/25/2021   HGBA1C 5.7 09/17/2020   HGBA1C 5.7 12/16/2016   HGBA1C 5.3 08/26/2016   Lab Results  Component Value Date   INSULIN 10.4 07/23/2021   INSULIN 13.0 08/26/2016   Lab Results  Component Value Date   TSH 1.520 07/23/2021   Lab Results  Component Value Date   CHOL 219 (H) 07/23/2021   HDL 53 07/23/2021   LDLCALC 132 (H) 07/23/2021   LDLDIRECT 86.0 10/13/2017   TRIG 194 (H) 07/23/2021   CHOLHDL 4 01/25/2021   Lab Results  Component Value Date   VD25OH  52.2 07/23/2021   VD25OH 30.87 07/09/2020   VD25OH 28.83 (L) 12/15/2019   Lab Results  Component Value Date   WBC 6.9 07/04/2021   HGB 10.3 (L) 07/04/2021   HCT 35.9 07/23/2021   MCV 78.5 (L) 07/04/2021   PLT 301 07/04/2021   Lab Results  Component Value Date   IRON 35 07/23/2021   TIBC 442 07/23/2021   FERRITIN 18 07/23/2021    Attestation Statements:   Reviewed by clinician on day of visit: allergies, medications, problem list, medical history, surgical history, family history, social history, and previous encounter notes.  Coral Ceo, CMA, am acting as transcriptionist for Coralie Common, MD.   I have reviewed the above documentation for accuracy and completeness, and I agree with the above. - Coralie Common, MD

## 2021-08-09 ENCOUNTER — Other Ambulatory Visit (HOSPITAL_COMMUNITY): Payer: Self-pay

## 2021-08-09 ENCOUNTER — Ambulatory Visit (INDEPENDENT_AMBULATORY_CARE_PROVIDER_SITE_OTHER): Payer: No Typology Code available for payment source | Admitting: Internal Medicine

## 2021-08-09 ENCOUNTER — Encounter: Payer: Self-pay | Admitting: Internal Medicine

## 2021-08-09 ENCOUNTER — Other Ambulatory Visit: Payer: Self-pay

## 2021-08-09 VITALS — BP 112/70 | HR 93 | Temp 98.1°F | Ht 64.0 in | Wt 247.0 lb

## 2021-08-09 DIAGNOSIS — M542 Cervicalgia: Secondary | ICD-10-CM | POA: Diagnosis not present

## 2021-08-09 DIAGNOSIS — F411 Generalized anxiety disorder: Secondary | ICD-10-CM

## 2021-08-09 DIAGNOSIS — M25562 Pain in left knee: Secondary | ICD-10-CM

## 2021-08-09 MED ORDER — MELOXICAM 15 MG PO TABS
15.0000 mg | ORAL_TABLET | Freq: Every day | ORAL | 1 refills | Status: DC | PRN
  Filled 2021-08-09: qty 90, 90d supply, fill #0

## 2021-08-09 NOTE — Patient Instructions (Signed)
Please take all new medication as prescribed - the anti-inflammatory  Please continue all other medications as before, and refills have been done if requested.  Please have the pharmacy call with any other refills you may need.  Please keep your appointments with your specialists as you may have planned  Please go to the XRAY Department in the first floor for the x-ray testing  Please see Dr Odessa Fleming if not improved   You will be contacted regarding the referral for: Sport Med for the left knee

## 2021-08-09 NOTE — Progress Notes (Signed)
Patient ID: Deanna Schwartz, female   DOB: 03/14/1981, 40 y.o.   MRN: [id number]        Chief Complaint: follow up left HA, bilateral arm numbness, palpitations, anxiety , left lateral knee pain       HPI:  Deanna Schwartz is a 40 y.o. female here with c/o left post neck and occipital area numbness and discomfort x 1 wk, mild to mod, constant,, worse to move the head and neck; , nothing else makes better or worse.  Does also have bilateral arm numbness to the upper arms and occasionally below the elbows to the wrists; but not clearly assoicated with the neck pain, is worse in the am to first wake up and lasts only 30 min and resolves, x 3 mo.  Also has ongoing issue with palpitaitons now finishing her cardiac monitor.  Recent echo essentially normal.  Denies worsening depressive symptoms, suicidal ideation, or panic; has ongoing anxiety, Also mentions 1 wk onset left lateral knee pain with localized pain and swelling after twisting getting OOB, mild to mod, constant, but no giveaways or falls.         Wt Readings from Last 3 Encounters:  08/13/21 250 lb (113.4 kg)  08/09/21 247 lb (112 kg)  08/06/21 245 lb (111.1 kg)   BP Readings from Last 3 Encounters:  08/13/21 110/70  08/09/21 112/70  08/06/21 118/82         Past Medical History:  Diagnosis Date   Allergic rhinitis    Anemia    Anxiety    Asthma    B12 deficiency with anti-parietal cell antibodies + 03/25/2011   New 03/2011    Chronic constipation    Constipation    Depression    GERD (gastroesophageal reflux disease)    H. pylori infection 2008   Hx of tx + serology   IBS (irritable bowel syndrome)    Iron deficiency anemia 08/06/2011   Hgb 10.2 and MCV 79 on health screen labs    Irritable bowel syndrome    Knee pain    Obesity    Palpitations    Panic attack    Prediabetes    Shortness of breath    Ulcer    Vitamin D deficiency    Past Surgical History:  Procedure Laterality Date   FLEXIBLE SIGMOIDOSCOPY   04/14/2008   normal   NASAL SINUS SURGERY     UPPER GASTROINTESTINAL ENDOSCOPY      reports that she has never smoked. She has never used smokeless tobacco. She reports current alcohol use. She reports that she does not use drugs. family history includes Anxiety disorder in her mother; Cancer in her father; Colon polyps in her paternal uncle; Diabetes in her maternal aunt, maternal aunt, paternal grandmother, and another family member; Hyperlipidemia in her father and mother; Hypertension in her father, mother, and another family member; Obesity in her mother. Allergies  Allergen Reactions   Augmentin [Amoxicillin-Pot Clavulanate] Rash   Cephalexin Rash   Iodine Swelling and Rash    Throat swelling after eating shrimp   Current Outpatient Medications on File Prior to Visit  Medication Sig Dispense Refill   albuterol (VENTOLIN HFA) 108 (90 Base) MCG/ACT inhaler INHALE 1 TO 2 PUFFS INTO THE LUNGS EVERY 6 HOURS AS NEEDED FOR WHEEZING OR SHORTNESS OF BREATH 18 g 5   budesonide-formoterol (SYMBICORT) 160-4.5 MCG/ACT inhaler Inhale 2 puffs into the lungs 2 (two) times daily. 30.6 g 3   cetirizine (ZYRTEC) 10 MG tablet  Take 1 tablet (10 mg total) by mouth daily. 90 tablet 3   Cholecalciferol (VITAMIN D3) 50 MCG (2000 UT) capsule Take 2 capsules (4,000 Units total) by mouth daily. (Patient taking differently: Take 5,000 Units by mouth daily.) 200 capsule 3   Cyanocobalamin (B-12) 1000 MCG CAPS Take by mouth.     EPINEPHrine (EPIPEN 2-PAK) 0.3 mg/0.3 mL IJ SOAJ injection Inject 1 pen (0.3 mg) into the muscle as needed for anaphylaxis. 2 each 3   Ferrous Sulfate 143 (45 Fe) MG TBCR Take 45 mg by mouth.     Magnesium 250 MG TABS Take 250 mg by mouth.     Multiple Vitamin (MULTIVITAMIN ADULT PO) Take by mouth.     sertraline (ZOLOFT) 50 MG tablet Take 1/2 tablet (25 mg total) by mouth daily. 30 tablet 0   montelukast (SINGULAIR) 10 MG tablet Take 1 tablet (10 mg total) by mouth at bedtime. 30 tablet 0    No current facility-administered medications on file prior to visit.        ROS:  All others reviewed and negative.  Objective        PE:  BP 112/70 (BP Location: Left Arm, Patient Position: Sitting, Cuff Size: Large)   Pulse 93   Temp 98.1 F (36.7 C) (Oral)   Ht 5\' 4"  (1.626 m)   Wt 247 lb (112 kg)   LMP 07/10/2021   SpO2 98%   BMI 42.40 kg/m                 Constitutional: Pt appears in NAD               HENT: Head: NCAT.                Right Ear: External ear normal.                 Left Ear: External ear normal.                Eyes: . Pupils are equal, round, and reactive to light. Conjunctivae and EOM are normal               Nose: without d/c or deformity               Neck: Neck supple. Gross normal ROM; left post neck and occiput with mild decreased sensation to LT               Cardiovascular: Normal rate and regular rhythm.                 Pulmonary/Chest: Effort normal and breath sounds without rales or wheezing.                Abd:  Soft, NT, ND, + BS, no organomegaly               Neurological: Pt is alert. At baseline orientation, motor grossly intact               Skin: Skin is warm. No rashes, no other new lesions, LE edema - none; left lateral knee with mild joint line ender and swelling               Psychiatric: Pt behavior is normal without agitation , mod nervous  Micro: none  Cardiac tracings I have personally interpreted today:  none  Pertinent Radiological findings (summarize): none   Lab Results  Component Value Date   WBC 6.9 07/04/2021   HGB 10.3 (  L) 07/04/2021   HCT 35.9 07/23/2021   PLT 301 07/04/2021   GLUCOSE 79 07/23/2021   CHOL 219 (H) 07/23/2021   TRIG 194 (H) 07/23/2021   HDL 53 07/23/2021   LDLDIRECT 86.0 10/13/2017   LDLCALC 132 (H) 07/23/2021   ALT 16 07/23/2021   AST 22 07/23/2021   NA 136 07/23/2021   K 4.4 07/23/2021   CL 98 07/23/2021   CREATININE 0.74 07/23/2021   BUN 12 07/23/2021   CO2 25 07/23/2021   TSH 1.520  07/23/2021   INR 1.0 07/09/2020   HGBA1C 5.6 07/23/2021   Assessment/Plan:  Deanna Schwartz is a 40 y.o. Black or African American [2] female with  has a past medical history of Allergic rhinitis, Anemia, Anxiety, Asthma, B12 deficiency with anti-parietal cell antibodies + (03/25/2011), Chronic constipation, Constipation, Depression, GERD (gastroesophageal reflux disease), H. pylori infection (2008), IBS (irritable bowel syndrome), Iron deficiency anemia (08/06/2011), Irritable bowel syndrome, Knee pain, Obesity, Palpitations, Panic attack, Prediabetes, Shortness of breath, Ulcer, and Vitamin D deficiency.  Neck pain on left side I suspect is c/w possible occipital neuralgia, has gabapentin at hom 100 mg - ok to take prn,  to f/u any worsening symptoms or concerns; also for c spine films  Generalized anxiety disorder Chronic persistent, cont current med tx zoloft 50  Left knee pain Exam c/w possible meniscal tear - for sport med eval and tx  Followup: Return if symptoms worsen or fail to improve.  Cathlean Cower, MD 08/14/2021 10:00 PM [facility]

## 2021-08-12 NOTE — Progress Notes (Signed)
Benito Mccreedy D.[place] [facility] [place] Phone: [phone]   Assessment and Plan:     1. Acute pain of both knees 2. Patellofemoral pain syndrome of both knees -Chronic with exacerbation, initial sports medicine visit - Intermittent bilateral knee pain with most significant pain being in right knee today, but typically hurts worse on the left.  No significant MOI - Suspect bilateral patellofemoral syndrome with bilateral patella alta seen on x-rays - Start HEP for patellofemoral syndrome - Continue meloxicam prescribed by PCP - Recommend starting physical activity with recommendations of stationary bike, elliptical, water aerobics - X-rays obtained in clinic.  My interpretation: No acute fracture or dislocation.  Mild patellofemoral cortical changes.  Mild bilateral patella alta - DG Knee AP/LAT W/Sunrise Right; Future - DG Knee AP/LAT W/Sunrise Left; Future     Pertinent previous records reviewed include PCP note   Follow Up: 4 weeks for reevaluation.  Could consider ultrasound versus CSI versus formal PT at that time if no improvement or worsening of symptoms   Subjective:    Chief Complaint: Left knee and neck pain   HPI:   08/13/21 Patient is a 40 year old female presenting with left knee and neck pain.Patient was seen by her primary care office on 08/09/21 for this reason and was prescribed Meloxicam, had an xray and referred to sports medicine for evaluation and treatment. . Neck is getting better was hurting 2 weeks ago along with L knee ,today is R knee.Does get numbness in the L arm from neck and has the feeling of restlessness in the left leg   Radiates:  Mechanical symptoms:no Swelling: L knee  Aggravates:no Treatments tried: Ibuprofen    Relevant Historical Information: None pertinent  Additional pertinent review of systems negative.   Current Outpatient Medications:    albuterol (VENTOLIN HFA)  108 (90 Base) MCG/ACT inhaler, INHALE 1 TO 2 PUFFS INTO THE LUNGS EVERY 6 HOURS AS NEEDED FOR WHEEZING OR SHORTNESS OF BREATH, Disp: 18 g, Rfl: 5   budesonide-formoterol (SYMBICORT) 160-4.5 MCG/ACT inhaler, Inhale 2 puffs into the lungs 2 (two) times daily., Disp: 30.6 g, Rfl: 3   cetirizine (ZYRTEC) 10 MG tablet, Take 1 tablet (10 mg total) by mouth daily., Disp: 90 tablet, Rfl: 3   Cholecalciferol (VITAMIN D3) 50 MCG (2000 UT) capsule, Take 2 capsules (4,000 Units total) by mouth daily. (Patient taking differently: Take 5,000 Units by mouth daily.), Disp: 200 capsule, Rfl: 3   Cyanocobalamin (B-12) 1000 MCG CAPS, Take by mouth., Disp: , Rfl:    EPINEPHrine (EPIPEN 2-PAK) 0.3 mg/0.3 mL IJ SOAJ injection, Inject 1 pen (0.3 mg) into the muscle as needed for anaphylaxis., Disp: 2 each, Rfl: 3   Ferrous Sulfate 143 (45 Fe) MG TBCR, Take 45 mg by mouth., Disp: , Rfl:    Magnesium 250 MG TABS, Take 250 mg by mouth., Disp: , Rfl:    meloxicam (MOBIC) 15 MG tablet, Take 1 tablet (15 mg total) by mouth daily as needed for pain., Disp: 90 tablet, Rfl: 1   Multiple Vitamin (MULTIVITAMIN ADULT PO), Take by mouth., Disp: , Rfl:    sertraline (ZOLOFT) 50 MG tablet, Take 1/2 tablet (25 mg total) by mouth daily., Disp: 30 tablet, Rfl: 0   montelukast (SINGULAIR) 10 MG tablet, Take 1 tablet (10 mg total) by mouth at bedtime., Disp: 30 tablet, Rfl: 0   Objective:     Vitals:   08/13/21 0939  BP: 110/70  Pulse: (!) 113  SpO2: 96%  Weight: 250 lb (113.4 kg)  Height: 5\' 4"  (1.626 m)      Body mass index is 42.91 kg/m.    Physical Exam:    General:  awake, alert oriented, no acute distress nontoxic Skin: no suspicious lesions or rashes Neuro:sensation intact, no deficits, strength 5/5 with no deficits, no atrophy, normal muscle tone Psych: No signs of anxiety, depression or other mood disorder  Bilateral knee: No swelling No deformity Neg fluid wave, joint milking ROM Flex 100, Ext 0 TTP  patella Patellar grind test and patellar compression positive bilaterally NTTP over the quad tendon, medial fem condyle, lat fem condyle, plica, patella tendon, tibial tuberostiy, fibular head, posterior fossa, pes anserine bursa, gerdy's tubercle, medial jt line, lateral jt line Neg lachman Neg sag sign Negative varus stress Negative valgus stress Negative McMurray  Gait normal    Electronically signed by:  Benito Mccreedy D.[facility] 10:37 AM 08/13/21

## 2021-08-13 ENCOUNTER — Ambulatory Visit (INDEPENDENT_AMBULATORY_CARE_PROVIDER_SITE_OTHER): Payer: No Typology Code available for payment source | Admitting: Sports Medicine

## 2021-08-13 ENCOUNTER — Other Ambulatory Visit: Payer: Self-pay

## 2021-08-13 ENCOUNTER — Ambulatory Visit (INDEPENDENT_AMBULATORY_CARE_PROVIDER_SITE_OTHER): Payer: No Typology Code available for payment source

## 2021-08-13 ENCOUNTER — Encounter: Payer: Self-pay | Admitting: Internal Medicine

## 2021-08-13 ENCOUNTER — Ambulatory Visit: Payer: No Typology Code available for payment source | Admitting: Internal Medicine

## 2021-08-13 VITALS — BP 110/70 | HR 113 | Ht 64.0 in | Wt 250.0 lb

## 2021-08-13 DIAGNOSIS — M25561 Pain in right knee: Secondary | ICD-10-CM

## 2021-08-13 DIAGNOSIS — M222X1 Patellofemoral disorders, right knee: Secondary | ICD-10-CM | POA: Diagnosis not present

## 2021-08-13 DIAGNOSIS — M222X2 Patellofemoral disorders, left knee: Secondary | ICD-10-CM | POA: Diagnosis not present

## 2021-08-13 DIAGNOSIS — M25562 Pain in left knee: Secondary | ICD-10-CM

## 2021-08-13 NOTE — Patient Instructions (Addendum)
Good to see you  Continue Meloxicam Start patella femoral HEP Start physical activity  -Stationary bike  -Elliptical - Water aerobics 4 week follow up

## 2021-08-14 ENCOUNTER — Encounter: Payer: Self-pay | Admitting: Internal Medicine

## 2021-08-14 DIAGNOSIS — M25562 Pain in left knee: Secondary | ICD-10-CM | POA: Insufficient documentation

## 2021-08-14 NOTE — Assessment & Plan Note (Signed)
Exam c/w possible meniscal tear - for sport med eval and tx

## 2021-08-14 NOTE — Assessment & Plan Note (Addendum)
I suspect is c/w possible occipital neuralgia, has gabapentin at hom 100 mg - ok to take prn,  to f/u any worsening symptoms or concerns; also for c spine films

## 2021-08-14 NOTE — Assessment & Plan Note (Signed)
Chronic persistent, cont current med tx zoloft 50

## 2021-08-19 ENCOUNTER — Other Ambulatory Visit (HOSPITAL_COMMUNITY): Payer: Self-pay

## 2021-08-21 ENCOUNTER — Telehealth (INDEPENDENT_AMBULATORY_CARE_PROVIDER_SITE_OTHER): Payer: No Typology Code available for payment source | Admitting: Family Medicine

## 2021-08-21 ENCOUNTER — Encounter (INDEPENDENT_AMBULATORY_CARE_PROVIDER_SITE_OTHER): Payer: Self-pay | Admitting: Family Medicine

## 2021-08-21 ENCOUNTER — Other Ambulatory Visit: Payer: Self-pay

## 2021-08-21 DIAGNOSIS — Z6841 Body Mass Index (BMI) 40.0 and over, adult: Secondary | ICD-10-CM

## 2021-08-21 DIAGNOSIS — E559 Vitamin D deficiency, unspecified: Secondary | ICD-10-CM | POA: Diagnosis not present

## 2021-08-22 NOTE — Progress Notes (Signed)
TeleHealth Visit:  Due to the COVID-19 pandemic, this visit was completed with telemedicine (audio/video) technology to reduce patient and provider exposure as well as to preserve personal protective equipment.   Deanna Schwartz has verbally consented to this TeleHealth visit. The patient is located at home, the provider is located at the [facility] office. The participants in this visit include the listed provider and patient. The visit was conducted today via video.  Chief Complaint: OBESITY Deanna Schwartz is here to discuss her progress with her obesity treatment plan along with follow-up of her obesity related diagnoses. Deanna Schwartz is on the Category 3 Plan + 8 oz protein and states she is following her eating plan approximately 80% of the time. Deanna Schwartz states she is not currently exercising.  Today's visit was #: 3 Starting weight: 243 lbs Starting date: 07/23/2021  Interim History: Pt has had a decline in appetite with feeling poorly (cough and URI sx's for 5 days). She has had increase in asthma sx's recently. Pt is not able to get all meat in but is trying to make substitutions. Even though she feels URI sxs, she is trying to stay on plan.  Subjective:   1. Vitamin D deficiency Pt denies nausea, vomiting, and muscle weakness but notes fatigue. Pt is on OTC Vit D 4,000 IU.  Assessment/Plan:   1. Vitamin D deficiency Low Vitamin D level contributes to fatigue and are associated with obesity, breast, and colon cancer. She agrees to continue to take OTC Vitamin D 4,000 IU daily and will follow-up for routine testing of Vitamin D, at least 2-3 times per year to avoid over-replacement.  2. Obesity with current BMI of 42.0  Deanna Schwartz is currently in the action stage of change. As such, her goal is to continue with weight loss efforts. She has agreed to the Category 3 Plan.   Exercise goals:  As is  Behavioral modification strategies: increasing lean protein intake, meal  planning and cooking strategies, keeping healthy foods in the home, and planning for success.  Deanna Schwartz has agreed to follow-up with our clinic in 3 weeks. She was informed of the importance of frequent follow-up visits to maximize her success with intensive lifestyle modifications for her multiple health conditions.  Objective:   VITALS: Per patient if applicable, see vitals. GENERAL: Alert and in no acute distress. CARDIOPULMONARY: No increased WOB. Speaking in clear sentences.  PSYCH: Pleasant and cooperative. Speech normal rate and rhythm. Affect is appropriate. Insight and judgement are appropriate. Attention is focused, linear, and appropriate.  NEURO: Oriented as arrived to appointment on time with no prompting.   Lab Results  Component Value Date   CREATININE 0.74 07/23/2021   BUN 12 07/23/2021   NA 136 07/23/2021   K 4.4 07/23/2021   CL 98 07/23/2021   CO2 25 07/23/2021   Lab Results  Component Value Date   ALT 16 07/23/2021   AST 22 07/23/2021   ALKPHOS 78 07/23/2021   BILITOT 0.3 07/23/2021   Lab Results  Component Value Date   HGBA1C 5.6 07/23/2021   HGBA1C 5.6 01/25/2021   HGBA1C 5.7 09/17/2020   HGBA1C 5.7 12/16/2016   HGBA1C 5.3 08/26/2016   Lab Results  Component Value Date   INSULIN 10.4 07/23/2021   INSULIN 13.0 08/26/2016   Lab Results  Component Value Date   TSH 1.520 07/23/2021   Lab Results  Component Value Date   CHOL 219 (H) 07/23/2021   HDL 53 07/23/2021   LDLCALC 132 (H)  07/23/2021   LDLDIRECT 86.0 10/13/2017   TRIG 194 (H) 07/23/2021   CHOLHDL 4 01/25/2021   Lab Results  Component Value Date   VD25OH 52.2 07/23/2021   VD25OH 30.87 07/09/2020   VD25OH 28.83 (L) 12/15/2019   Lab Results  Component Value Date   WBC 6.9 07/04/2021   HGB 10.3 (L) 07/04/2021   HCT 35.9 07/23/2021   MCV 78.5 (L) 07/04/2021   PLT 301 07/04/2021   Lab Results  Component Value Date   IRON 35 07/23/2021   TIBC 442 07/23/2021   FERRITIN 18  07/23/2021    Attestation Statements:   Reviewed by clinician on day of visit: allergies, medications, problem list, medical history, surgical history, family history, social history, and previous encounter notes.  Coral Ceo, CMA, am acting as transcriptionist for Coralie Common, MD.  I have reviewed the above documentation for accuracy and completeness, and I agree with the above. - Coralie Common, MD

## 2021-08-27 NOTE — Telephone Encounter (Signed)
Pt states she is needing a rx for prednisone as she is having frequent asthma flare ups and noticed she is using her inhaler more often due to the wether change and her many flares. Pt states she has chest tightness, however upon using her inhaler she has relief.  **Pt denies SOB.

## 2021-08-28 ENCOUNTER — Other Ambulatory Visit: Payer: Self-pay

## 2021-08-28 ENCOUNTER — Ambulatory Visit
Admission: EM | Admit: 2021-08-28 | Discharge: 2021-08-28 | Disposition: A | Discharge status: Home / Self Care | Payer: No Typology Code available for payment source | Attending: Emergency Medicine | Admitting: Emergency Medicine

## 2021-08-28 DIAGNOSIS — J4541 Moderate persistent asthma with (acute) exacerbation: Secondary | ICD-10-CM | POA: Diagnosis not present

## 2021-08-28 MED ORDER — GUAIFENESIN 400 MG PO TABS: ORAL_TABLET | ORAL | 0 refills | Status: DC

## 2021-08-28 MED ORDER — AEROCHAMBER PLUS FLO-VU LARGE MISC: 1.0000 | Freq: Once | 0 refills | Status: AC

## 2021-08-28 MED ORDER — ALBUTEROL SULFATE HFA 108 (90 BASE) MCG/ACT IN AERS: INHALATION_SPRAY | RESPIRATORY_TRACT | 0 refills | Status: DC

## 2021-08-28 MED ORDER — METHYLPREDNISOLONE 4 MG PO TBPK: ORAL_TABLET | ORAL | 0 refills | Status: DC

## 2021-08-28 MED ORDER — METHYLPREDNISOLONE SODIUM SUCC 125 MG IJ SOLR
125.0000 mg | Freq: Once | INTRAMUSCULAR | Status: AC
  Administered 2021-08-28: 12:00:00 125 mg via INTRAMUSCULAR

## 2021-08-28 MED ORDER — FLUTICASONE PROPIONATE 50 MCG/ACT NA SUSP: 2.0000 | Freq: Every day | NASAL | 0 refills | Status: DC

## 2021-08-28 NOTE — Discharge Instructions (Addendum)
You are suffering from an acute exacerbation of your moderate persistent asthma.  I recommend the following:  To help resolve your to quickly address your significant respiratory inflammation, you are provided with an injection of methylprednisolone in the office today.  You should continue to feel the full benefit of the steroid for the next 6 to 8 hours.   (Medrol Dosepak): This is a steroid that will significantly calm your upper and lower airways, please take one row of tablets daily with your breakfast meal starting tomorrow morning until the prescription is complete.  I provided you with a prescription.  This steroid is different than the prednisolone that I previously prescribed for you and should definitely be covered by your insurance.  Guaifenesin (Robitussin, Mucinex): This is an expectorant.  This helps break up chest congestion and loosen up thick nasal drainage making phlegm and drainage more liquid and therefore easier to remove.  I recommend being 400 mg three times daily as needed.  This medication is available over-the-counter but I have provided you with a prescription.  Fluticasone (Flonase): This is a steroid nasal spray that you use once daily, 1 spray in each nare.  After 3 to 5 days of use, you will have significant improvement of the inflammation and mucus production/postnasal drip that is being caused by exposure to allergens.  This medication can be purchased over-the-counter however I have provided you with a prescription.     I have renewed your prescription for albuterol and provided you with a spacer as well.  Please do not hesitate to inhale 2 puffs of albuterol twice daily, preferably prior to your 2 puffs of Symbicort every morning and evening, and again every 4-6 hours as needed for cough, chest tightness, shortness of breath, wheezing.  Based on my physical exam findings and the history you have provided  today, I do not recommend antibiotics at this time.  I do not  believe the risks and side effects of antibiotics would outweigh any minimal benefit that they might provide.      Please follow-up within the next 3 to 5 days either with your primary care provider or urgent care if your symptoms do not resolve.  If you do not have a primary care provider, we will assist you in finding one.

## 2021-08-28 NOTE — ED Provider Notes (Signed)
[facility]    CSN: [id number] Arrival date & time: 08/28/21  1019    HISTORY  No chief complaint on file.  HPI Deanna Schwartz is a 40 y.o. female. Patient reports a history of asthma, currently using multiple doses of albuterol every 1-2 hours.  States she had a cold about a month and a half ago, was prescribed steroids but they were too expensive so she did not take them.  Patient states he feels that she taken that maybe she would be in position she is in right now.  Patient states her chest feels tight, she has been having a lot of coughing, feeling short of breath with activity.  Patient states she had a cold about 2 weeks ago, just does not seem to be able to shake it, still having a lot of nasal congestion, has been prescribed Flonase in the past but is not currently using it.  Patient states that she is using Symbicort at this time but admits to missing a few doses here and there, mostly her evening doses here and there.  The history is provided by the patient.  Past Medical History:  Diagnosis Date   Allergic rhinitis    Anemia    Anxiety    Asthma    B12 deficiency with anti-parietal cell antibodies + 03/25/2011   New 03/2011    Chronic constipation    Constipation    Depression    GERD (gastroesophageal reflux disease)    H. pylori infection 2008   Hx of tx + serology   IBS (irritable bowel syndrome)    Iron deficiency anemia 08/06/2011   Hgb 10.2 and MCV 79 on health screen labs    Irritable bowel syndrome    Knee pain    Obesity    Palpitations    Panic attack    Prediabetes    Shortness of breath    Ulcer    Vitamin D deficiency    Patient Active Problem List   Diagnosis Date Noted   Left knee pain 08/14/2021   Neck pain on left side 08/09/2021   Menorrhagia 04/29/2021   Acute bronchitis 12/25/2020   Epistaxis 07/10/2020   Hamstring strain, right, subsequent encounter 07/18/2019   Numbness 06/29/2019   Low back pain with sciatica  06/29/2019   Mid back pain 04/21/2019   Concussion with no loss of consciousness 04/21/2019   Nonallopathic lesion of cervical region 06/14/2018   Sternoclavicular joint pain, right 06/14/2018   Trapezius muscle spasm 05/22/2017   Knee MCL sprain 11/13/2016   Depression 11/10/2016   Class 2 obesity without serious comorbidity with body mass index (BMI) of 39.0 to 39.9 in adult 10/14/2016   Nonallopathic lesion of sacral region 10/10/2016   Panic attack 08/15/2016   Coccydynia 06/10/2016   Vasovagal syncope 03/14/2016   RUQ pain 03/14/2016   Vitamin D deficiency 11/22/2015   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   Nonallopathic lesion of thoracic region 02/21/2015   Nonallopathic lesion of lumbosacral region 02/21/2015   Nonallopathic lesion-rib cage 02/21/2015   Piriformis syndrome of left side 02/21/2015   Cervicalgia 01/22/2015   Obesity (BMI 35.0-39.9 without comorbidity) 01/22/2015   Muscle tension headache 01/22/2015   Iron deficiency anemia due to chronic blood loss - menses 01/03/2015   Chronic chest wall pain - mostly left side 01/03/2015   Well adult exam 11/13/2014   B12 deficiency 11/13/2014   Acromioclavicular joint separation, type 1 07/12/2014   Trochanteric bursitis of left hip 06/20/2014  Patellofemoral pain syndrome 04/07/2014   Acute medial meniscal tear 01/25/2014   Weight gain 11/18/2013   Anemia, iron deficiency 11/18/2013   Acute sinusitis 11/05/2013   Abdominal pain, left upper quadrant-musculoskeletal 05/13/2013   Iron deficiency anemia 08/06/2011   Arthralgia 03/24/2011   INSOMNIA, CHRONIC 10/11/2010   IBS 04/07/2008   Palpitations 01/11/2008   Generalized anxiety disorder 11/25/2007   Allergic rhinitis 11/25/2007   GERD 11/25/2007   SINUSITIS, CHRONIC 04/03/2007   Asthma 04/03/2007   Past Surgical History:  Procedure Laterality Date   FLEXIBLE SIGMOIDOSCOPY  04/14/2008   normal   NASAL SINUS SURGERY     UPPER GASTROINTESTINAL ENDOSCOPY     OB  History     Gravida  4   Para  3   Term  3   Preterm      AB  1   Living  3      SAB  1   IAB      Ectopic      Multiple      Live Births             Home Medications    Prior to Admission medications   Medication Sig Start Date End Date Taking? Authorizing Provider  albuterol (VENTOLIN HFA) 108 (90 Base) MCG/ACT inhaler INHALE 1 TO 2 PUFFS INTO THE LUNGS EVERY 6 HOURS AS NEEDED FOR WHEEZING OR SHORTNESS OF BREATH 01/24/21 01/24/22  Plotnikov, Evie Lacks, MD  budesonide-formoterol (SYMBICORT) 160-4.5 MCG/ACT inhaler Inhale 2 puffs into the lungs 2 (two) times daily. 06/06/21   Plotnikov, Evie Lacks, MD  cetirizine (ZYRTEC) 10 MG tablet Take 1 tablet (10 mg total) by mouth daily. 11/05/18   Marrian Salvage, FNP  Cholecalciferol (VITAMIN D3) 50 MCG (2000 UT) capsule Take 2 capsules (4,000 Units total) by mouth daily. Patient taking differently: Take 5,000 Units by mouth daily. 07/10/20   Plotnikov, Evie Lacks, MD  Cyanocobalamin (B-12) 1000 MCG CAPS Take by mouth.    [provider]  EPINEPHrine (EPIPEN 2-PAK) 0.3 mg/0.3 mL IJ SOAJ injection Inject 1 pen (0.3 mg) into the muscle as needed for anaphylaxis. 03/14/21   Plotnikov, Evie Lacks, MD  Ferrous Sulfate 143 (45 Fe) MG TBCR Take 45 mg by mouth.    [provider]  Magnesium 250 MG TABS Take 250 mg by mouth.    [provider]  meloxicam (MOBIC) 15 MG tablet Take 1 tablet (15 mg total) by mouth daily as needed for pain. 08/09/21   Biagio Borg, MD  montelukast (SINGULAIR) 10 MG tablet Take 1 tablet (10 mg total) by mouth at bedtime. 06/13/21 07/23/21  Lynden Oxford Scales, PA-C  Multiple Vitamin (MULTIVITAMIN ADULT PO) Take by mouth.    [provider]  sertraline (ZOLOFT) 50 MG tablet Take 1/2 tablet (25 mg total) by mouth daily. 08/06/21   Laqueta Linden, MD   Family History Family History  Problem Relation Age of Onset   Hyperlipidemia Mother    Hypertension Mother     Anxiety disorder Mother    Obesity Mother    Cancer Father    Hyperlipidemia Father    Hypertension Father    Diabetes Paternal Grandmother    Diabetes Maternal Aunt    Diabetes Maternal Aunt    Colon polyps Paternal Uncle        x4   Diabetes Other        grandmother   Hypertension Other    Colon cancer Neg Hx  Social History Social History   Tobacco Use   Smoking status: Never   Smokeless tobacco: Never  Vaping Use   Vaping Use: Never used  Substance Use Topics   Alcohol use: Yes    Alcohol/week: 0.0 standard drinks    Comment: Social maybe twice amonth, beer   Drug use: No   Allergies   Augmentin [amoxicillin-pot clavulanate], Cephalexin, and Iodine  Review of Systems Review of Systems Pertinent findings noted in history of present illness.   Physical Exam Triage Vital Signs ED Triage Vitals  Enc Vitals Group     BP 07/05/21 0827 (!) 147/82     Pulse Rate 07/05/21 0827 72     Resp 07/05/21 0827 18     Temp 07/05/21 0827 98.3 F (36.8 C)     Temp Source 07/05/21 0827 Oral     SpO2 07/05/21 0827 98 %     Weight --      Height --      Head Circumference --      Peak Flow --      Pain Score 07/05/21 0826 5     Pain Loc --      Pain Edu? --      Excl. in [place]? --   No data found.  Updated Vital Signs BP 124/81 (BP Location: Left Arm)    Pulse 97    Temp 98.3 F (36.8 C) (Oral)    Resp 18    LMP 08/10/2021    SpO2 97%   Physical Exam Vitals and nursing note reviewed.  Constitutional:      General: She is not in acute distress.    Appearance: Normal appearance. She is not ill-appearing.  HENT:     Head: Normocephalic and atraumatic.     Salivary Glands: Right salivary gland is not diffusely enlarged or tender. Left salivary gland is not diffusely enlarged or tender.     Right Ear: Ear canal and external ear normal. No drainage. A middle ear effusion is present. There is no impacted cerumen. Tympanic membrane is bulging. Tympanic membrane is not  injected or erythematous.     Left Ear: Ear canal and external ear normal. No drainage. A middle ear effusion is present. There is no impacted cerumen. Tympanic membrane is bulging. Tympanic membrane is not injected or erythematous.     Ears:     Comments: Bilateral EACs normal, both TMs bulging with clear fluid    Nose: Rhinorrhea present. No nasal deformity, septal deviation, signs of injury, nasal tenderness, mucosal edema or congestion. Rhinorrhea is clear.     Right Nostril: Occlusion present. No foreign body, epistaxis or septal hematoma.     Left Nostril: Occlusion present. No foreign body, epistaxis or septal hematoma.     Right Turbinates: Enlarged, swollen and pale.     Left Turbinates: Enlarged, swollen and pale.     Right Sinus: No maxillary sinus tenderness or frontal sinus tenderness.     Left Sinus: No maxillary sinus tenderness or frontal sinus tenderness.     Mouth/Throat:     Lips: Pink. No lesions.     Mouth: Mucous membranes are moist. No oral lesions.     Pharynx: Oropharynx is clear. Uvula midline. No posterior oropharyngeal erythema or uvula swelling.     Tonsils: No tonsillar exudate. 0 on the right. 0 on the left.     Comments: Postnasal drip Eyes:     General: Lids are normal.  Right eye: No discharge.        Left eye: No discharge.     Extraocular Movements: Extraocular movements intact.     Conjunctiva/sclera: Conjunctivae normal.     Right eye: Right conjunctiva is not injected.     Left eye: Left conjunctiva is not injected.  Neck:     Trachea: Trachea and phonation normal.  Cardiovascular:     Rate and Rhythm: Normal rate and regular rhythm.     Pulses: Normal pulses.     Heart sounds: Normal heart sounds. No murmur heard.   No friction rub. No gallop.  Pulmonary:     Effort: Pulmonary effort is normal. No accessory muscle usage, prolonged expiration or respiratory distress.     Breath sounds: No stridor, decreased air movement or transmitted  upper airway sounds. Examination of the right-upper field reveals wheezing and rales. Examination of the left-upper field reveals wheezing and rales. Examination of the right-middle field reveals wheezing and rales. Examination of the left-middle field reveals wheezing and rales. Examination of the right-lower field reveals wheezing and rales. Examination of the left-lower field reveals wheezing and rales. Wheezing and rales present. No decreased breath sounds or rhonchi.     Comments: Diffuse wheezing and rales throughout all lung fields Chest:     Chest wall: No tenderness.  Musculoskeletal:        General: Normal range of motion.     Cervical back: Normal range of motion and neck supple. Normal range of motion.  Lymphadenopathy:     Cervical: No cervical adenopathy.  Skin:    General: Skin is warm and dry.     Findings: No erythema or rash.  Neurological:     General: No focal deficit present.     Mental Status: She is alert and oriented to person, place, and time.  Psychiatric:        Mood and Affect: Mood normal.        Behavior: Behavior normal.    Visual Acuity Right Eye Distance:   Left Eye Distance:   Bilateral Distance:    Right Eye Near:   Left Eye Near:    Bilateral Near:     [facility] Couse / Diagnostics / Procedures:    EKG  Radiology No results found.  Procedures Procedures (including critical care time)  UC Diagnoses / Final Clinical Impressions(s)   I have reviewed the triage vital signs and the nursing notes.  Pertinent labs & imaging results that were available during my care of the patient were reviewed by me and considered in my medical decision making (see chart for details).   Final diagnoses:  Moderate persistent asthma not dependent on systemic steroids with acute exacerbation  Acute exacerbation of moderate persistent asthma.  Provided with a methylprednisolone injection and a methylprednisolone Dosepak.  Patient advised to use albuterol liberally,  continue Symbicort resume Flonase and continue guaifenesin.  Prescriptions provided.  Return precautions advised.   ED Prescriptions     Medication Sig Dispense Auth. Provider   methylPREDNISolone (MEDROL DOSEPAK) 4 MG TBPK tablet Take 24 mg on day 1, 20 mg on day 2, 16 mg on day 3, 12 mg on day 4, 8 mg on day 5, 4 mg on day 6. 21 tablet Lynden Oxford Scales, PA-C   albuterol (VENTOLIN HFA) 108 (90 Base) MCG/ACT inhaler INHALE 1 TO 2 PUFFS INTO THE LUNGS EVERY 6 HOURS AS NEEDED FOR WHEEZING OR SHORTNESS OF BREATH 18 g Lynden Oxford Scales, PA-C   guaifenesin (  HUMIBID E) 400 MG TABS tablet Take 1 tablet 3 times daily as needed for chest congestion and cough 21 tablet Lynden Oxford Scales, PA-C   fluticasone (FLONASE) 50 MCG/ACT nasal spray Place 2 sprays into both nostrils daily. 18 mL Lynden Oxford Scales, PA-C   Spacer/Aero-Holding Chambers (AEROCHAMBER PLUS FLO-VU LARGE) MISC 1 each by Other route once for 1 dose. 1 each Lynden Oxford Scales, PA-C      PDMP not reviewed this encounter.  Pending results:  Labs Reviewed - No data to display  Medications Ordered in UC: Medications  methylPREDNISolone sodium succinate (SOLU-MEDROL) 125 mg/2 mL injection 125 mg (125 mg Intramuscular Given 08/28/21 1138)    Disposition Upon Discharge:  Condition: stable for discharge home Home: take medications as prescribed; routine discharge instructions as discussed; follow up as advised.  Patient presented with an acute illness with associated systemic symptoms and significant discomfort requiring urgent management. In my opinion, this is a condition that a prudent lay person (someone who possesses an average knowledge of health and medicine) may potentially expect to result in complications if not addressed urgently such as respiratory distress, impairment of bodily function or dysfunction of bodily organs.   Routine symptom specific, illness specific and/or disease specific instructions were  discussed with the patient and/or caregiver at length.   As such, the patient has been evaluated and assessed, work-up was performed and treatment was provided in alignment with urgent care protocols and evidence based medicine.  Patient/parent/caregiver has been advised that the patient may require follow up for further testing and treatment if the symptoms continue in spite of treatment, as clinically indicated and appropriate.  If the patient was tested for COVID-19, Influenza and/or RSV, then the patient/parent/guardian was advised to isolate at home pending the results of his/her diagnostic coronavirus test and potentially longer if theyre positive. I have also advised pt that if his/her COVID-19 test returns positive, it's recommended to self-isolate for at least 10 days after symptoms first appeared AND until fever-free for 24 hours without fever reducer AND other symptoms have improved or resolved. Discussed self-isolation recommendations as well as instructions for household member/close contacts as per the [facility] and [place] DHHS, and also gave patient the [place] packet with this information.  Patient/parent/caregiver has been advised to return to the [facility] or PCP in 3-5 days if no better; to PCP or the Emergency Department if new signs and symptoms develop, or if the current signs or symptoms continue to change or worsen for further workup, evaluation and treatment as clinically indicated and appropriate  The patient will follow up with their current PCP if and as advised. If the patient does not currently have a PCP we will assist them in obtaining one.   The patient may need specialty follow up if the symptoms continue, in spite of conservative treatment and management, for further workup, evaluation, consultation and treatment as clinically indicated and appropriate.  Patient/parent/caregiver verbalized understanding and agreement of plan as discussed.  All questions were addressed during visit.   Please see discharge instructions below for further details of plan.  Discharge Instructions:   Discharge Instructions      You are suffering from an acute exacerbation of your moderate persistent asthma.  I recommend the following:  To help resolve your to quickly address your significant respiratory inflammation, you are provided with an injection of methylprednisolone in the office today.  You should continue to feel the full benefit of the steroid for the next 6 to 8  hours.   (Medrol Dosepak): This is a steroid that will significantly calm your upper and lower airways, please take one row of tablets daily with your breakfast meal starting tomorrow morning until the prescription is complete.  I provided you with a prescription.  This steroid is different than the prednisolone that I previously prescribed for you and should definitely be covered by your insurance.  Guaifenesin (Robitussin, Mucinex): This is an expectorant.  This helps break up chest congestion and loosen up thick nasal drainage making phlegm and drainage more liquid and therefore easier to remove.  I recommend being 400 mg three times daily as needed.  This medication is available over-the-counter but I have provided you with a prescription.  Fluticasone (Flonase): This is a steroid nasal spray that you use once daily, 1 spray in each nare.  After 3 to 5 days of use, you will have significant improvement of the inflammation and mucus production/postnasal drip that is being caused by exposure to allergens.  This medication can be purchased over-the-counter however I have provided you with a prescription.     I have renewed your prescription for albuterol and provided you with a spacer as well.  Please do not hesitate to inhale 2 puffs of albuterol twice daily, preferably prior to your 2 puffs of Symbicort every morning and evening, and again every 4-6 hours as needed for cough, chest tightness, shortness of breath,  wheezing.  Based on my physical exam findings and the history you have provided  today, I do not recommend antibiotics at this time.  I do not believe the risks and side effects of antibiotics would outweigh any minimal benefit that they might provide.      Please follow-up within the next 3 to 5 days either with your primary care provider or urgent care if your symptoms do not resolve.  If you do not have a primary care provider, we will assist you in finding one.       This office note has been dictated using Museum/gallery curator.  Unfortunately, and despite my best efforts, this method of dictation can sometimes lead to occasional typographical or grammatical errors.  I apologize in advance if this occurs.          Lynden Oxford Scales, PA-C 08/28/21 1303

## 2021-08-28 NOTE — ED Triage Notes (Signed)
Pt states a weeks ago she began having an asthma flare up and has been using her inhaler more. Patient denies SOB at this time.

## 2021-09-03 ENCOUNTER — Encounter: Payer: Self-pay | Admitting: Sports Medicine

## 2021-09-03 NOTE — Progress Notes (Signed)
Benito Mccreedy D.[place] Phone: [phone]   Assessment and Plan:     1. Acute pain of right knee 2. Patellofemoral pain syndrome of both knees -Chronic with exacerbation, subsequent visit - Acute flare of right knee pain.  Most likely patellofemoral syndrome flare, though potentially could have meniscal pathology - Restart meloxicam 15 mg daily for 2 weeks - Continue HEP for patellofemoral syndrome - Offered intra-articular CSI, though patient declined at this visit.  Could consider a follow-up - Unremarkable ultrasound per below  Sports Medicine: Musculoskeletal Ultrasound.   Exam:Right  Knee complete US Diagnosis:   Patellar tendon: Normal Quadriceps tendon:  Normal Pes anserine bursa/tendons: Normal Lateral structures: Normal Posterior knee: Normal Effusion: absent Additional findings: None    Impression:  1.  Unremarkable knee ultrasound   Pertinent previous records reviewed include none   Follow Up: 3 to 4 weeks for reevaluation.  Could consider CSI versus MRI based on patient's symptoms   Subjective:   I, Moenique Parris, am serving as a Education administrator for Doctor Glennon Mac  Chief Complaint: right knee pain   HPI:  08/13/21 Patient is a 40 year old female presenting with left knee and neck pain.Patient was seen by her primary care office on 08/09/21 for this reason and was prescribed Meloxicam, had an xray and referred to sports medicine for evaluation and treatment. . Neck is getting better was hurting 2 weeks ago along with L knee ,today is R knee.Does get numbness in the L arm from neck and has the feeling of restlessness in the left leg    Radiates:  Mechanical symptoms:no Swelling: L knee  Aggravates:no Treatments tried: Ibuprofen   09/04/2021 Patient states that her right knee really hurts feels like a charlie horse in the back of her calf when she tries to stretch but it  hurts to stretch started over the weekend no MOI tried meloxicam , icing , helps but the pain is still there feels like its going to break or tear if she says on the knee no numbness tingling, only radiates to calf.   Relevant Historical Information: Elevated BMI  Additional pertinent review of systems negative.   Current Outpatient Medications:    albuterol (VENTOLIN HFA) 108 (90 Base) MCG/ACT inhaler, INHALE 1 TO 2 PUFFS INTO THE LUNGS EVERY 6 HOURS AS NEEDED FOR WHEEZING OR SHORTNESS OF BREATH, Disp: 18 g, Rfl: 0   budesonide-formoterol (SYMBICORT) 160-4.5 MCG/ACT inhaler, Inhale 2 puffs into the lungs 2 (two) times daily., Disp: 30.6 g, Rfl: 3   cetirizine (ZYRTEC) 10 MG tablet, Take 1 tablet (10 mg total) by mouth daily., Disp: 90 tablet, Rfl: 3   Cholecalciferol (VITAMIN D3) 50 MCG (2000 UT) capsule, Take 2 capsules (4,000 Units total) by mouth daily. (Patient taking differently: Take 5,000 Units by mouth daily.), Disp: 200 capsule, Rfl: 3   Cyanocobalamin (B-12) 1000 MCG CAPS, Take by mouth., Disp: , Rfl:    EPINEPHrine (EPIPEN 2-PAK) 0.3 mg/0.3 mL IJ SOAJ injection, Inject 1 pen (0.3 mg) into the muscle as needed for anaphylaxis., Disp: 2 each, Rfl: 3   Ferrous Sulfate 143 (45 Fe) MG TBCR, Take 45 mg by mouth., Disp: , Rfl:    fluticasone (FLONASE) 50 MCG/ACT nasal spray, Place 2 sprays into both nostrils daily., Disp: 18 mL, Rfl: 0   guaifenesin (HUMIBID E) 400 MG TABS tablet, Take 1 tablet 3 times daily as needed for chest congestion and cough, Disp:  21 tablet, Rfl: 0   Magnesium 250 MG TABS, Take 250 mg by mouth., Disp: , Rfl:    meloxicam (MOBIC) 15 MG tablet, Take 1 tablet (15 mg total) by mouth daily as needed for pain., Disp: 90 tablet, Rfl: 1   meloxicam (MOBIC) 15 MG tablet, Take 1 tablet (15 mg total) by mouth daily., Disp: 30 tablet, Rfl: 0   methylPREDNISolone (MEDROL DOSEPAK) 4 MG TBPK tablet, Take 24 mg on day 1, 20 mg on day 2, 16 mg on day 3, 12 mg on day 4, 8 mg on day 5,  4 mg on day 6., Disp: 21 tablet, Rfl: 0   Multiple Vitamin (MULTIVITAMIN ADULT PO), Take by mouth., Disp: , Rfl:    sertraline (ZOLOFT) 50 MG tablet, Take 1/2 tablet (25 mg total) by mouth daily., Disp: 30 tablet, Rfl: 0   montelukast (SINGULAIR) 10 MG tablet, Take 1 tablet (10 mg total) by mouth at bedtime., Disp: 30 tablet, Rfl: 0   Objective:     Vitals:   09/04/21 1027  BP: 120/80  Pulse: (!) 102  SpO2: 99%  Height: 5\' 4"  (1.626 m)      Body mass index is 42.91 kg/m.    Physical Exam:    General:  awake, alert oriented, no acute distress nontoxic Skin: no suspicious lesions or rashes Neuro:sensation intact, no deficits, strength 5/5 with no deficits, no atrophy, normal muscle tone Psych: No signs of anxiety, depression or other mood disorder  Right knee: No swelling No deformity Neg fluid wave, joint milking ROM Flex 100, Ext 5 TTP medial femoral condyle, medial joint line, posterior fossa, patella NTTP over the quad tendon,  lat fem condyle,   patella tendon, tibial tuberostiy, fibular head,  pes anserine bursa, gerdy's tubercle,   lateral jt line Neg anterior and posterior drawer Neg lachman Neg sag sign Negative varus stress Negative valgus stress Negative McMurray Positive Thessaly Positive patellar grind  Gait normal    Electronically signed by:  Benito Mccreedy D.[facility] 12:28 PM 09/04/21

## 2021-09-04 ENCOUNTER — Ambulatory Visit: Payer: No Typology Code available for payment source | Admitting: Sports Medicine

## 2021-09-04 ENCOUNTER — Other Ambulatory Visit: Payer: Self-pay

## 2021-09-04 ENCOUNTER — Ambulatory Visit: Payer: Self-pay

## 2021-09-04 VITALS — BP 120/80 | HR 102 | Ht 64.0 in

## 2021-09-04 DIAGNOSIS — M222X1 Patellofemoral disorders, right knee: Secondary | ICD-10-CM

## 2021-09-04 DIAGNOSIS — M25561 Pain in right knee: Secondary | ICD-10-CM | POA: Diagnosis not present

## 2021-09-04 DIAGNOSIS — M222X2 Patellofemoral disorders, left knee: Secondary | ICD-10-CM

## 2021-09-04 MED ORDER — MELOXICAM 15 MG PO TABS: 15.0000 mg | ORAL_TABLET | Freq: Every day | ORAL | 0 refills | Status: DC

## 2021-09-04 NOTE — Patient Instructions (Addendum)
Good to see you  Meloxicam 15 mg for 2 weeks Continue HEP  3 week follow up

## 2021-09-24 NOTE — Progress Notes (Deleted)
Deanna Schwartz D.[place] Phone: [phone]   Assessment and Plan:     There are no diagnoses linked to this encounter.  ***   Pertinent previous records reviewed include ***   Follow Up: ***     Subjective:   I, Deanna Schwartz, am serving as a Education administrator for Deanna Schwartz  Chief Complaint: knee pain   HPI:  08/13/21 Patient is a 41 year old female presenting with left knee and neck pain.Patient was seen by her primary care office on 08/09/21 for this reason and was prescribed Meloxicam, had an xray and referred to sports medicine for evaluation and treatment. . Neck is getting better was hurting 2 weeks ago along with L knee ,today is R knee.Does get numbness in the L arm from neck and has the feeling of restlessness in the left leg    Radiates:  Mechanical symptoms:no Swelling: L knee  Aggravates:no Treatments tried: Ibuprofen    09/04/2021 Patient states that her right knee really hurts feels like a charlie horse in the back of her calf when she tries to stretch but it hurts to stretch started over the weekend no MOI tried meloxicam , icing , helps but the pain is still there feels like its going to break or tear if she says on the knee no numbness tingling, only radiates to calf.    09/25/2021 Patient states    Relevant Historical Information: Elevated BMI   Additional pertinent review of systems negative.   Current Outpatient Medications:    albuterol (VENTOLIN HFA) 108 (90 Base) MCG/ACT inhaler, INHALE 1 TO 2 PUFFS INTO THE LUNGS EVERY 6 HOURS AS NEEDED FOR WHEEZING OR SHORTNESS OF BREATH, Disp: 18 g, Rfl: 0   budesonide-formoterol (SYMBICORT) 160-4.5 MCG/ACT inhaler, Inhale 2 puffs into the lungs 2 (two) times daily., Disp: 30.6 g, Rfl: 3   cetirizine (ZYRTEC) 10 MG tablet, Take 1 tablet (10 mg total) by mouth daily., Disp: 90 tablet, Rfl: 3   Cholecalciferol (VITAMIN D3) 50 MCG  (2000 UT) capsule, Take 2 capsules (4,000 Units total) by mouth daily. (Patient taking differently: Take 5,000 Units by mouth daily.), Disp: 200 capsule, Rfl: 3   Cyanocobalamin (B-12) 1000 MCG CAPS, Take by mouth., Disp: , Rfl:    EPINEPHrine (EPIPEN 2-PAK) 0.3 mg/0.3 mL IJ SOAJ injection, Inject 1 pen (0.3 mg) into the muscle as needed for anaphylaxis., Disp: 2 each, Rfl: 3   Ferrous Sulfate 143 (45 Fe) MG TBCR, Take 45 mg by mouth., Disp: , Rfl:    fluticasone (FLONASE) 50 MCG/ACT nasal spray, Place 2 sprays into both nostrils daily., Disp: 18 mL, Rfl: 0   guaifenesin (HUMIBID E) 400 MG TABS tablet, Take 1 tablet 3 times daily as needed for chest congestion and cough, Disp: 21 tablet, Rfl: 0   Magnesium 250 MG TABS, Take 250 mg by mouth., Disp: , Rfl:    meloxicam (MOBIC) 15 MG tablet, Take 1 tablet (15 mg total) by mouth daily as needed for pain., Disp: 90 tablet, Rfl: 1   meloxicam (MOBIC) 15 MG tablet, Take 1 tablet (15 mg total) by mouth daily., Disp: 30 tablet, Rfl: 0   methylPREDNISolone (MEDROL DOSEPAK) 4 MG TBPK tablet, Take 24 mg on day 1, 20 mg on day 2, 16 mg on day 3, 12 mg on day 4, 8 mg on day 5, 4 mg on day 6., Disp: 21 tablet, Rfl: 0   montelukast (SINGULAIR)  10 MG tablet, Take 1 tablet (10 mg total) by mouth at bedtime., Disp: 30 tablet, Rfl: 0   Multiple Vitamin (MULTIVITAMIN ADULT PO), Take by mouth., Disp: , Rfl:    sertraline (ZOLOFT) 50 MG tablet, Take 1/2 tablet (25 mg total) by mouth daily., Disp: 30 tablet, Rfl: 0   Objective:     There were no vitals filed for this visit.    There is no height or weight on file to calculate BMI.    Physical Exam:    ***   Electronically signed by:  Deanna Schwartz D.[facility] 2:55 PM 09/24/21

## 2021-09-25 ENCOUNTER — Ambulatory Visit: Payer: No Typology Code available for payment source | Admitting: Sports Medicine

## 2021-09-26 ENCOUNTER — Encounter (INDEPENDENT_AMBULATORY_CARE_PROVIDER_SITE_OTHER): Payer: Self-pay

## 2021-09-26 ENCOUNTER — Ambulatory Visit (INDEPENDENT_AMBULATORY_CARE_PROVIDER_SITE_OTHER): Payer: No Typology Code available for payment source | Admitting: Family Medicine

## 2021-09-27 ENCOUNTER — Ambulatory Visit (INDEPENDENT_AMBULATORY_CARE_PROVIDER_SITE_OTHER): Payer: No Typology Code available for payment source | Admitting: Nurse Practitioner

## 2021-09-27 ENCOUNTER — Other Ambulatory Visit (HOSPITAL_COMMUNITY): Payer: Self-pay

## 2021-09-27 ENCOUNTER — Encounter: Payer: Self-pay | Admitting: Nurse Practitioner

## 2021-09-27 ENCOUNTER — Other Ambulatory Visit (INDEPENDENT_AMBULATORY_CARE_PROVIDER_SITE_OTHER): Payer: No Typology Code available for payment source

## 2021-09-27 VITALS — BP 122/70 | HR 120 | Ht 63.25 in | Wt 249.2 lb

## 2021-09-27 DIAGNOSIS — K760 Fatty (change of) liver, not elsewhere classified: Secondary | ICD-10-CM

## 2021-09-27 DIAGNOSIS — R1011 Right upper quadrant pain: Secondary | ICD-10-CM | POA: Diagnosis not present

## 2021-09-27 DIAGNOSIS — R1012 Left upper quadrant pain: Secondary | ICD-10-CM

## 2021-09-27 LAB — CBC WITH DIFFERENTIAL/PLATELET
Basophils Absolute: 0 10*3/uL (ref 0.0–0.1)
Basophils Relative: 0.3 % (ref 0.0–3.0)
Eosinophils Absolute: 0.4 10*3/uL (ref 0.0–0.7)
Eosinophils Relative: 3.4 % (ref 0.0–5.0)
HCT: 34.1 % — ABNORMAL LOW (ref 36.0–46.0)
Hemoglobin: 10.7 g/dL — ABNORMAL LOW (ref 12.0–15.0)
Lymphocytes Relative: 9.2 % — ABNORMAL LOW (ref 12.0–46.0)
Lymphs Abs: 1.1 10*3/uL (ref 0.7–4.0)
MCHC: 31.5 g/dL (ref 30.0–36.0)
MCV: 75.1 fl — ABNORMAL LOW (ref 78.0–100.0)
Monocytes Absolute: 0.6 10*3/uL (ref 0.1–1.0)
Monocytes Relative: 5.5 % (ref 3.0–12.0)
Neutro Abs: 9.7 10*3/uL — ABNORMAL HIGH (ref 1.4–7.7)
Neutrophils Relative %: 81.6 % — ABNORMAL HIGH (ref 43.0–77.0)
Platelets: 264 10*3/uL (ref 150.0–400.0)
RBC: 4.54 Mil/uL (ref 3.87–5.11)
RDW: 16.7 % — ABNORMAL HIGH (ref 11.5–15.5)
WBC: 11.9 10*3/uL — ABNORMAL HIGH (ref 4.0–10.5)

## 2021-09-27 LAB — COMPREHENSIVE METABOLIC PANEL
ALT: 12 U/L (ref 0–35)
AST: 15 U/L (ref 0–37)
Albumin: 4 g/dL (ref 3.5–5.2)
Alkaline Phosphatase: 58 U/L (ref 39–117)
BUN: 10 mg/dL (ref 6–23)
CO2: 28 mEq/L (ref 19–32)
Calcium: 9.2 mg/dL (ref 8.4–10.5)
Chloride: 101 mEq/L (ref 96–112)
Creatinine, Ser: 0.83 mg/dL (ref 0.40–1.20)
GFR: 88.68 mL/min (ref >60.00)
Glucose, Bld: 93 mg/dL (ref 70–99)
Potassium: 3.8 mEq/L (ref 3.5–5.1)
Sodium: 134 mEq/L — ABNORMAL LOW (ref 135–145)
Total Bilirubin: 0.3 mg/dL (ref 0.2–1.2)
Total Protein: 7.7 g/dL (ref 6.0–8.3)

## 2021-09-27 LAB — LIPASE: Lipase: 24 U/L (ref 11.0–59.0)

## 2021-09-27 MED ORDER — DICYCLOMINE HCL 10 MG PO CAPS
10.0000 mg | ORAL_CAPSULE | Freq: Three times a day (TID) | ORAL | 1 refills | Status: DC | PRN
  Filled 2021-09-27: qty 30, 10d supply, fill #0

## 2021-09-27 NOTE — Progress Notes (Signed)
09/27/2021 Deanna Schwartz [id number] [birth year]   CHIEF COMPLAINT: RUQ and LUQ pain  HISTORY OF PRESENT ILLNESS: Deanna Schwartz is a 41 year old female with a past medical history of asthma, seasonal allergies, anxiety, depression, B12 deficiency, iron deficiency anemia secondary to menorrhagia, GERD and H. Pylori gastritis 2013.  She presents her office today for further evaluation regarding to different abdominal pains.  She has RUQ pain which is dull which radiates down toward the RLQ area which started 2 weeks ago.  Eating does not trigger or worsen her abdominal pains. She has fairly constant nausea during the day without vomiting.  She had RUQ 1 year ago for which she underwent RUQ sonogram which showed hepatic steatosis and a normal gallbladder.  She underwent EGD in 2013 due to having stomach pain which identified H. pylori gastritis which was treated with Pylera. She has LUQ pain which radiates deep into the left flank area which started mid November 2022 ( she had similar left flank pain 2014 -2016). She saw an orthopedist for the left flank pain and she was prescribed meloxicam for suspected musculoskeletal pain.  She took the meloxicam for 10 days without improvement.  The left pain has awakened her from sleep 2-3 times over the past few months.  She denies having any fever or night sweats.  She has gained 15 pounds in the past year.  She takes ibuprofen 600 mg once or twice monthly as needed for aches and pains.  She drinks 3 cups of coffee daily.  She drinks 1 glass of red wine most evenings.  She is passing normal formed brown bowel movement daily.  No rectal bleeding or black stools.  She has a chronic history of menorrhagia with associated iron deficiency anemia.  She has heavy menstrual bleeding for 5 days monthly.  She previously took oral iron supplement then transition to a prenatal vitamin.  Father and paternal uncle with history of colon polyps.  No known family history  of esophageal, gastric or colon cancer.  RUQ ultrasound 09/18/2020: Gallbladder: No gallstones or wall thickening visualized. No sonographic Murphy sign noted by sonographer.   Common bile duct:Diameter: 2.5 mm   Liver: No focal lesion identified. Increased parenchymal echogenicity. Portal vein is patent on color Doppler imaging with normal direction of blood flow towards the liver.    IMPRESSION: Hepatic steatosis.  No focal hepatic lesion.  EGD 01/09/2012 done secondary to epigastric pain, vomiting and IDA. Diminutive polyps removed from the body of the stomach 2cm hiatal hernia Path report showed Helicobacter pylori gastritis without evidence of intestinal metaplasia, dysplasia or malignancy.  She was prescribed Pylera.  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 pylori infection 2008   Hx of tx + serology   IBS (irritable bowel syndrome)    Iron deficiency anemia 08/06/2011   Hgb 10.2 and MCV 79 on health screen labs    Irritable bowel syndrome    Knee pain    Obesity    Palpitations    Panic attack    Prediabetes    Shortness of breath    Ulcer  Vitamin D deficiency    Past Surgical History:  Procedure Laterality Date   FLEXIBLE SIGMOIDOSCOPY  04/14/2008   normal   NASAL SINUS SURGERY     UPPER GASTROINTESTINAL ENDOSCOPY     Social History: She is married.  She has 2 daughters ages 84 and 45 and 44 son age 90. Nonsmoker. She drinks red wine one glass most night. No drug use.   Family History:  reports that she has never smoked. She has never used smokeless tobacco. She reports current alcohol use. She reports that she does not use  drugs. family history includes Anxiety disorder in her mother; Cancer in her father; Colon polyps in her paternal uncle; Diabetes in her maternal aunt, maternal aunt, paternal grandmother, and another family member; Hyperlipidemia in her father and mother; Hypertension in her father, mother, and another family member; Obesity in her mother. Father and paternal uncle with colon polyps. Father had prostate cancer.   Allergies  Allergen Reactions   Augmentin [Amoxicillin-Pot Clavulanate] Rash   Cephalexin Rash   Iodine Swelling and Rash    Throat swelling after eating shrimp     Outpatient Encounter Medications as of 09/27/2021  Medication Sig   albuterol (VENTOLIN HFA) 108 (90 Base) MCG/ACT inhaler INHALE 1 TO 2 PUFFS INTO THE LUNGS EVERY 6 HOURS AS NEEDED FOR WHEEZING OR SHORTNESS OF BREATH   budesonide-formoterol (SYMBICORT) 160-4.5 MCG/ACT inhaler Inhale 2 puffs into the lungs 2 (two) times daily.   cetirizine (ZYRTEC) 10 MG tablet Take 1 tablet (10 mg total) by mouth daily.   Cholecalciferol (VITAMIN D3) 50 MCG (2000 UT) capsule Take 2 capsules (4,000 Units total) by mouth daily. (Patient taking differently: Take 5,000 Units by mouth daily.)   Cyanocobalamin (B-12) 1000 MCG CAPS Take by mouth.   Magnesium 250 MG TABS Take 250 mg by mouth.   meloxicam (MOBIC) 15 MG tablet Take 1 tablet (15 mg total) by mouth daily as needed for pain.   Prenatal Vit-Fe Fumarate-FA (PRENATAL VITAMINS PO) Take 1 tablet by mouth daily.   sertraline (ZOLOFT) 50 MG tablet Take 1/2 tablet (25 mg total) by mouth daily. (Patient taking differently: Take 25 mg by mouth as needed.)   EPINEPHrine (EPIPEN 2-PAK) 0.3 mg/0.3 mL IJ SOAJ injection Inject 1 pen (0.3 mg) into the muscle as needed for anaphylaxis. (Patient not taking: Reported on 09/27/2021)   [DISCONTINUED] Ferrous Sulfate 143 (45 Fe) MG TBCR Take 45 mg by mouth.   [DISCONTINUED] fluticasone (FLONASE) 50 MCG/ACT nasal spray Place 2 sprays into both nostrils  daily.   [DISCONTINUED] guaifenesin (HUMIBID E) 400 MG TABS tablet Take 1 tablet 3 times daily as needed for chest congestion and cough   [DISCONTINUED] meloxicam (MOBIC) 15 MG tablet Take 1 tablet (15 mg total) by mouth daily.   [DISCONTINUED] methylPREDNISolone (MEDROL DOSEPAK) 4 MG TBPK tablet Take 24 mg on day 1, 20 mg on day 2, 16 mg on day 3, 12 mg on day 4, 8 mg on day 5, 4 mg on day 6.   [DISCONTINUED] montelukast (SINGULAIR) 10 MG tablet Take 1 tablet (10 mg total) by mouth at bedtime.   [DISCONTINUED] Multiple Vitamin (MULTIVITAMIN ADULT PO) Take by mouth.   No facility-administered encounter medications on file as of 09/27/2021.   REVIEW OF SYSTEMS:  Gen: Denies fever, sweats or chills. No weight loss.  CV: Denies chest pain, palpitations or edema. Resp: Denies cough, shortness of breath of hemoptysis.  GI: See HPI.   GU : Denies urinary burning, blood  in urine, increased urinary frequency or incontinence. MS: Denies joint pain, muscles aches or weakness. Derm: Denies rash, itchiness, skin lesions or unhealing ulcers. Psych: + Anxiety.  Heme: Denies bruising, bleeding. Neuro:  Denies headaches, dizziness or paresthesias. Endo:  Denies any problems with DM, thyroid or adrenal function.  PHYSICAL EXAM: BP 122/70 (BP Location: Left Arm, Patient Position: Sitting, Cuff Size: Large)    Pulse (!) 120    Ht 5' 3.25" (1.607 m) Comment: height measured without shoes   Wt 249 lb 4 oz (113.1 kg)    LMP 09/08/2021    BMI 43.80 kg/m  General: 41 year old female in no acute distress. Head: Normocephalic and atraumatic. Eyes:  Sclerae non-icteric, conjunctive pink. Ears: Normal auditory acuity. Mouth: Dentition intact. No ulcers or lesions.  Neck: Supple, no lymphadenopathy or thyromegaly.  Lungs: Clear bilaterally to auscultation without wheezes, crackles or rhonchi. Heart: Regular rate and rhythm. No murmur, rub or gallop appreciated.  Abdomen: Soft, nontender, non distended. No masses.  No hepatosplenomegaly. Normoactive bowel sounds x 4 quadrants.  Rectal: Deferred. Musculoskeletal: Symmetrical with no gross deformities. Skin: Warm and dry. No rash or lesions on visible extremities. Extremities: No edema. Neurological: Alert oriented x 4, no focal deficits.  Psychological:  Alert and cooperative. Normal mood and affect.  ASSESSMENT AND PLAN:  82) 41 year old female with RUQ which radiates to the RLQ x 2 weeks and LUQ/left flank pain x 8 weeks.  Nausea without vomiting.  History of H. pylori gastritis per EGD 2013. -H. pylori stool antigen -CBC, CMP and lipase level -Dicyclomine 10 mg 1 p.o. 3 times daily -Nexium 20 mg OTC 1 p.o. daily, patient to start after H. pylori stool test completed -Consider scheduling CTAP after the above lab results reviewed -If the above evaluation is unrevealing and her symptoms persist she may require an EGD and colonoscopy  2) Hepatic steatosis per RUQ sonogram 09/19/2019.  Normal LFTs -Due to carbohydrate intake, weight loss, exercise as tolerated to reduce the risk of fatty liver disease  3) Chronic history of IDA in setting of chronic menorrhagia -Continue follow-up with GYN -Continue prenatal vitamin for now -Consider endoscopic evaluation to further assess possible GI etiology for IDA   CC:  Plotnikov, Deanna Lacks, MD

## 2021-09-27 NOTE — Patient Instructions (Addendum)
LABS:  Lab work has been ordered for you today. Our lab is located in the basement. Press "B" on the elevator. The lab is located at the first door on the left as you exit the elevator.  HEALTHCARE LAWS AND MY CHART RESULTS:  Due to recent changes in healthcare laws, you may see the results of your imaging and laboratory studies on MyChart before your provider has had a chance to review them.   We understand that in some cases there may be results that are confusing or concerning to you. Not all laboratory results come back in the same time frame and the provider may be waiting for multiple results in order to interpret others.  Please give [place] 48 hours in order for your provider to thoroughly review all the results before contacting the office for clarification of your results.   MEDICATION: We have sent the following medication to your pharmacy for you to pick up at your convenience: Dicyclomine 10 MG. Take 1 three times a day as needed.  Ok to start Nexium 20 MG once a day after completing the stool test. This is over the counter.  It was great seeing you today! Thank you for entrusting me with your care and choosing [facility].  Noralyn Pick, CRNP  The  GI providers would like to encourage you to use [facility] to communicate with providers for non-urgent requests or questions.  Due to long hold times on the telephone, sending your provider a message by [facility] may be faster and more efficient way to get a response. Please allow 48 business hours for a response.  Please remember that this is for non-urgent requests/questions.  If you are age 27 or older, your body mass index should be between 23-30. Your Body mass index is 43.8 kg/m. If this is out of the aforementioned range listed, please consider follow up with your Primary Care Provider.  If you are age 63 or younger, your body mass index should be between 19-25. Your Body mass index is 43.8 kg/m. If this  is out of the aformentioned range listed, please consider follow up with your Primary Care Provider.

## 2021-09-30 ENCOUNTER — Other Ambulatory Visit: Payer: Self-pay

## 2021-09-30 ENCOUNTER — Telehealth: Payer: Self-pay

## 2021-09-30 DIAGNOSIS — R1032 Left lower quadrant pain: Secondary | ICD-10-CM

## 2021-09-30 DIAGNOSIS — R1012 Left upper quadrant pain: Secondary | ICD-10-CM

## 2021-09-30 NOTE — Telephone Encounter (Signed)
Deanna Schwartz, patient has a shrimp Iodine allergy. Please contact the radiology center to verify this allergy and nonionic contrast needed. Please have the radiologist verify if patient needs pre meds. Let me know outcome. Thx

## 2021-09-30 NOTE — Telephone Encounter (Signed)
Spoke with the patient. She states she is not allergic to the contrast. She has had 2 imaging procedures with CT and no problem. She did not pre-medicate. Scheduled for CT with [facility] 10/10/21 and she will pick up her contrast here.

## 2021-09-30 NOTE — Telephone Encounter (Signed)
Spoke with the patient. She reports when she got home Friday evening she did not feel well. Her temp was 102.0 oral. She was fine the next day. No further fevers or other symptoms. She still wants to have the CT scan.

## 2021-09-30 NOTE — Telephone Encounter (Signed)
Noted  

## 2021-10-02 ENCOUNTER — Other Ambulatory Visit: Payer: Self-pay

## 2021-10-02 ENCOUNTER — Ambulatory Visit (INDEPENDENT_AMBULATORY_CARE_PROVIDER_SITE_OTHER): Payer: No Typology Code available for payment source | Admitting: Internal Medicine

## 2021-10-02 ENCOUNTER — Encounter: Payer: Self-pay | Admitting: Internal Medicine

## 2021-10-02 ENCOUNTER — Other Ambulatory Visit (HOSPITAL_COMMUNITY): Payer: Self-pay

## 2021-10-02 DIAGNOSIS — J0101 Acute recurrent maxillary sinusitis: Secondary | ICD-10-CM | POA: Diagnosis not present

## 2021-10-02 DIAGNOSIS — H1033 Unspecified acute conjunctivitis, bilateral: Secondary | ICD-10-CM | POA: Diagnosis not present

## 2021-10-02 MED ORDER — POLYMYXIN B-TRIMETHOPRIM 10000-0.1 UNIT/ML-% OP SOLN
1.0000 [drp] | OPHTHALMIC | 0 refills | Status: DC
  Filled 2021-10-02: qty 10, 16d supply, fill #0

## 2021-10-02 MED ORDER — FLUCONAZOLE 150 MG PO TABS
150.0000 mg | ORAL_TABLET | Freq: Once | ORAL | 1 refills | Status: AC
  Filled 2021-10-02: qty 1, 1d supply, fill #0

## 2021-10-02 MED ORDER — AZITHROMYCIN 250 MG PO TABS
ORAL_TABLET | ORAL | 0 refills | Status: DC
  Filled 2021-10-02: qty 6, 5d supply, fill #0

## 2021-10-02 NOTE — Progress Notes (Signed)
Subjective:  Patient ID: Deanna Schwartz, female    DOB: 24-Oct-1980  Age: 41 y.o. MRN: [id number]  CC: Conjunctivitis (Pt states sxs started yesterday.. both eyes. Alos have a discharge coming from them) and Sore Throat   HPI Deanna Schwartz presents for pink eye on B sides x 2 d. C/o ST, since Fri - thick d/c  Outpatient Medications Prior to Visit  Medication Sig Dispense Refill   albuterol (VENTOLIN HFA) 108 (90 Base) MCG/ACT inhaler INHALE 1 TO 2 PUFFS INTO THE LUNGS EVERY 6 HOURS AS NEEDED FOR WHEEZING OR SHORTNESS OF BREATH 18 g 0   budesonide-formoterol (SYMBICORT) 160-4.5 MCG/ACT inhaler Inhale 2 puffs into the lungs 2 (two) times daily. 30.6 g 3   cetirizine (ZYRTEC) 10 MG tablet Take 1 tablet (10 mg total) by mouth daily. 90 tablet 3   Cholecalciferol (VITAMIN D3) 50 MCG (2000 UT) capsule Take 2 capsules (4,000 Units total) by mouth daily. (Patient taking differently: Take 5,000 Units by mouth daily.) 200 capsule 3   Cyanocobalamin (B-12) 1000 MCG CAPS Take by mouth.     dicyclomine (BENTYL) 10 MG capsule Take 1 capsule (10 mg total) by mouth 3 (three) times daily as needed for spasms (abdominal pain). 30 capsule 1   Magnesium 250 MG TABS Take 250 mg by mouth.     meloxicam (MOBIC) 15 MG tablet Take 1 tablet (15 mg total) by mouth daily as needed for pain. 90 tablet 1   Prenatal Vit-Fe Fumarate-FA (PRENATAL VITAMINS PO) Take 1 tablet by mouth daily.     sertraline (ZOLOFT) 50 MG tablet Take 1/2 tablet (25 mg total) by mouth daily. (Patient taking differently: Take 25 mg by mouth as needed.) 30 tablet 0   EPINEPHrine (EPIPEN 2-PAK) 0.3 mg/0.3 mL IJ SOAJ injection Inject 1 pen (0.3 mg) into the muscle as needed for anaphylaxis. (Patient not taking: Reported on 09/27/2021) 2 each 3   No facility-administered medications prior to visit.    ROS: Review of Systems  Constitutional:  Positive for fatigue. Negative for activity change, appetite change, chills and unexpected weight  change.  HENT:  Positive for postnasal drip, rhinorrhea and sinus pain. Negative for congestion, mouth sores and sinus pressure.   Eyes:  Positive for pain, discharge and redness. Negative for photophobia and visual disturbance.  Respiratory:  Negative for cough and chest tightness.   Cardiovascular:  Negative for chest pain.  Gastrointestinal:  Negative for abdominal pain and nausea.  Genitourinary:  Negative for difficulty urinating, frequency and vaginal pain.  Musculoskeletal:  Negative for back pain and gait problem.  Skin:  Negative for pallor and rash.  Neurological:  Negative for dizziness, tremors, weakness, numbness and headaches.  Psychiatric/Behavioral:  Negative for confusion and sleep disturbance.    Objective:  BP 112/70 (BP Location: Left Arm)    Pulse 93    Temp 98.9 F (37.2 C) (Oral)    LMP 09/08/2021    SpO2 97%   BP Readings from Last 3 Encounters:  10/02/21 112/70  09/27/21 122/70  09/04/21 120/80    Wt Readings from Last 3 Encounters:  09/27/21 249 lb 4 oz (113.1 kg)  08/13/21 250 lb (113.4 kg)  08/09/21 247 lb (112 kg)    Physical Exam Constitutional:      General: She is not in acute distress.    Appearance: She is well-developed. She is obese.  HENT:     Head: Normocephalic.     Right Ear: External ear normal. Tympanic membrane  is not erythematous.     Left Ear: External ear normal.     Nose: Congestion present.     Mouth/Throat:     Pharynx: Posterior oropharyngeal erythema present.     Tonsils: No tonsillar exudate.  Eyes:     General: Vision grossly intact.        Right eye: Discharge present.        Left eye: Discharge present.    Conjunctiva/sclera:     Right eye: Right conjunctiva is injected.     Left eye: Left conjunctiva is injected.     Pupils: Pupils are equal, round, and reactive to light.  Neck:     Thyroid: No thyromegaly.     Vascular: No JVD.     Trachea: No tracheal deviation.  Cardiovascular:     Rate and Rhythm: Normal  rate and regular rhythm.     Heart sounds: Normal heart sounds.  Pulmonary:     Effort: No respiratory distress.     Breath sounds: No stridor. No wheezing.  Abdominal:     General: Bowel sounds are normal. There is no distension.     Palpations: Abdomen is soft. There is no mass.     Tenderness: There is no abdominal tenderness. There is no guarding or rebound.  Musculoskeletal:        General: No tenderness.     Cervical back: Normal range of motion and neck supple. No rigidity.  Lymphadenopathy:     Cervical: No cervical adenopathy.  Skin:    Findings: No erythema or rash.  Neurological:     Cranial Nerves: No cranial nerve deficit.     Motor: No abnormal muscle tone.     Coordination: Coordination normal.     Deep Tendon Reflexes: Reflexes normal.  Psychiatric:        Behavior: Behavior normal.        Thought Content: Thought content normal.        Judgment: Judgment normal.    Lab Results  Component Value Date   WBC 11.9 (H) 09/27/2021   HGB 10.7 (L) 09/27/2021   HCT 34.1 (L) 09/27/2021   PLT 264.0 09/27/2021   GLUCOSE 93 09/27/2021   CHOL 219 (H) 07/23/2021   TRIG 194 (H) 07/23/2021   HDL 53 07/23/2021   LDLDIRECT 86.0 10/13/2017   LDLCALC 132 (H) 07/23/2021   ALT 12 09/27/2021   AST 15 09/27/2021   NA 134 (L) 09/27/2021   K 3.8 09/27/2021   CL 101 09/27/2021   CREATININE 0.83 09/27/2021   BUN 10 09/27/2021   CO2 28 09/27/2021   TSH 1.520 07/23/2021   INR 1.0 07/09/2020   HGBA1C 5.6 07/23/2021    No results found.  Assessment & Plan:   Problem List Items Addressed This Visit     Acute conjunctivitis of both eyes   Acute sinusitis     New Z-Pak, Diflucan      Relevant Medications   azithromycin (ZITHROMAX Z-PAK) 250 MG tablet   fluconazole (DIFLUCAN) 150 MG tablet      Meds ordered this encounter  Medications   trimethoprim-polymyxin b (POLYTRIM) ophthalmic solution    Sig: Place 1 drop into both eyes every 4 (four) hours.    Dispense:   10 mL    Refill:  0   azithromycin (ZITHROMAX Z-PAK) 250 MG tablet    Sig: As directed    Dispense:  6 tablet    Refill:  0   fluconazole (DIFLUCAN) 150 MG tablet  Sig: Take 1 tablet (150 mg total) by mouth once for 1 dose.    Dispense:  1 tablet    Refill:  1      Follow-up: No follow-ups on file.  [name], MD

## 2021-10-02 NOTE — Assessment & Plan Note (Signed)
New Z-Pak, Diflucan

## 2021-10-04 ENCOUNTER — Other Ambulatory Visit: Payer: Self-pay | Admitting: Obstetrics and Gynecology

## 2021-10-04 DIAGNOSIS — N644 Mastodynia: Secondary | ICD-10-CM

## 2021-10-10 ENCOUNTER — Other Ambulatory Visit: Payer: No Typology Code available for payment source

## 2021-10-10 ENCOUNTER — Other Ambulatory Visit: Payer: Self-pay

## 2021-10-10 ENCOUNTER — Ambulatory Visit (INDEPENDENT_AMBULATORY_CARE_PROVIDER_SITE_OTHER)
Admission: RE | Admit: 2021-10-10 | Discharge: 2021-10-10 | Disposition: A | Discharge status: Home / Self Care | Payer: No Typology Code available for payment source | Source: Ambulatory Visit | Attending: Gastroenterology | Admitting: Gastroenterology

## 2021-10-10 DIAGNOSIS — R1011 Right upper quadrant pain: Secondary | ICD-10-CM

## 2021-10-10 DIAGNOSIS — K760 Fatty (change of) liver, not elsewhere classified: Secondary | ICD-10-CM

## 2021-10-10 DIAGNOSIS — R1012 Left upper quadrant pain: Secondary | ICD-10-CM | POA: Diagnosis not present

## 2021-10-10 DIAGNOSIS — R1032 Left lower quadrant pain: Secondary | ICD-10-CM | POA: Diagnosis not present

## 2021-10-10 MED ORDER — IOHEXOL 300 MG/ML  SOLN
100.0000 mL | Freq: Once | INTRAMUSCULAR | Status: AC | PRN
  Administered 2021-10-10: 100 mL via INTRAVENOUS

## 2021-10-12 LAB — H. PYLORI ANTIGEN, STOOL: H pylori Ag, Stl: NEGATIVE

## 2021-10-28 ENCOUNTER — Other Ambulatory Visit: Payer: Self-pay | Admitting: Internal Medicine

## 2021-10-28 ENCOUNTER — Other Ambulatory Visit (HOSPITAL_COMMUNITY): Payer: Self-pay

## 2021-10-28 MED ORDER — ALBUTEROL SULFATE HFA 108 (90 BASE) MCG/ACT IN AERS
INHALATION_SPRAY | RESPIRATORY_TRACT | 0 refills | Status: DC
  Filled 2021-10-30: qty 18, 25d supply, fill #0

## 2021-10-30 ENCOUNTER — Other Ambulatory Visit (HOSPITAL_COMMUNITY): Payer: Self-pay

## 2021-10-30 NOTE — Telephone Encounter (Signed)
Patient is requesting a refill of the azithromycin that was sent in on the last office visit 10/02/21 with 6 tablets.

## 2021-11-01 MED ORDER — AZITHROMYCIN 250 MG PO TABS
ORAL_TABLET | ORAL | 0 refills | Status: DC
  Filled 2021-11-01: qty 6, 5d supply, fill #0

## 2021-11-02 ENCOUNTER — Other Ambulatory Visit (HOSPITAL_COMMUNITY): Payer: Self-pay

## 2021-11-08 ENCOUNTER — Other Ambulatory Visit (HOSPITAL_COMMUNITY): Payer: Self-pay

## 2021-11-19 ENCOUNTER — Telehealth: Payer: Self-pay | Admitting: Internal Medicine

## 2021-11-19 NOTE — Telephone Encounter (Signed)
Attempted to call about CT results and labs and follow-up from office visit with app in late January we will call again ?

## 2021-11-29 ENCOUNTER — Other Ambulatory Visit (HOSPITAL_COMMUNITY): Payer: Self-pay

## 2021-11-29 ENCOUNTER — Encounter: Payer: Self-pay | Admitting: Internal Medicine

## 2021-11-29 ENCOUNTER — Other Ambulatory Visit: Payer: Self-pay | Admitting: Internal Medicine

## 2021-11-29 MED ORDER — ALBUTEROL SULFATE HFA 108 (90 BASE) MCG/ACT IN AERS
INHALATION_SPRAY | RESPIRATORY_TRACT | 0 refills | Status: DC
  Filled 2021-11-29: qty 18, 25d supply, fill #0

## 2021-12-02 NOTE — Telephone Encounter (Signed)
We spoke she is feeling better with the treatments Jaclyn Shaggy put her on.  She has lots of questions about food in terms of how she feels and weight.  She is trying to cut back on processed foods.  She is taking a prenatal vitamin to try to help increase iron levels.  She is still awaiting a partial hysterectomy due to menorrhagia issues. ? ? ?We need to have an in person office visit together please contact her and arrange an appointment with me ?

## 2021-12-03 NOTE — Telephone Encounter (Signed)
Left message for pt to call back  °

## 2021-12-04 NOTE — Telephone Encounter (Signed)
Left message for pt to call back  °

## 2021-12-05 NOTE — Telephone Encounter (Signed)
Left message for pt to call back  °

## 2021-12-06 NOTE — Telephone Encounter (Signed)
Left message for pt to call back  °

## 2021-12-09 ENCOUNTER — Encounter: Payer: Self-pay | Admitting: Internal Medicine

## 2021-12-09 ENCOUNTER — Other Ambulatory Visit (HOSPITAL_COMMUNITY): Payer: Self-pay

## 2021-12-09 ENCOUNTER — Ambulatory Visit (INDEPENDENT_AMBULATORY_CARE_PROVIDER_SITE_OTHER): Payer: No Typology Code available for payment source | Admitting: Internal Medicine

## 2021-12-09 DIAGNOSIS — J4531 Mild persistent asthma with (acute) exacerbation: Secondary | ICD-10-CM | POA: Diagnosis not present

## 2021-12-09 MED ORDER — PREDNISONE 20 MG PO TABS
40.0000 mg | ORAL_TABLET | Freq: Every day | ORAL | 0 refills | Status: DC
  Filled 2021-12-09: qty 14, 7d supply, fill #0

## 2021-12-09 MED ORDER — BENZONATATE 200 MG PO CAPS
200.0000 mg | ORAL_CAPSULE | Freq: Three times a day (TID) | ORAL | 0 refills | Status: DC | PRN
  Filled 2021-12-09: qty 60, 20d supply, fill #0

## 2021-12-09 MED ORDER — MONTELUKAST SODIUM 10 MG PO TABS
10.0000 mg | ORAL_TABLET | Freq: Every day | ORAL | 3 refills | Status: DC
  Filled 2021-12-09: qty 90, 90d supply, fill #0
  Filled 2022-07-24: qty 90, 90d supply, fill #1
  Filled 2022-12-04 – 2022-12-08 (×2): qty 90, 90d supply, fill #2

## 2021-12-09 MED ORDER — AZITHROMYCIN 250 MG PO TABS
ORAL_TABLET | ORAL | 0 refills | Status: AC
  Filled 2021-12-09: qty 6, 5d supply, fill #0

## 2021-12-09 NOTE — Assessment & Plan Note (Signed)
With flare up today. Rx prednisone and azithromycin. Rx tessalon perles for cough. Continue taking albuterol prn and symbicort BID scheduled. Rx singulair to add on daily and keep zyrtec daily as well.  ?

## 2021-12-09 NOTE — Patient Instructions (Signed)
We have sent in the prednisone to take 2 pills daily for 1 week. We have sent in a z-pack to take 2 pills today, then 1 pill daily starting tomorrow until gone. ? ?We have sent in the tessalon perles. ? ?We have sent in singulair (montelukast) for the allergies to take 1 pill daily during pollen season to help prevent this from coming right back. ?

## 2021-12-09 NOTE — Telephone Encounter (Signed)
Left message for pt to call back  °

## 2021-12-09 NOTE — Progress Notes (Signed)
? ?  Subjective:  ? ?Patient ID: Deanna Schwartz, female    DOB: 03-17-1981, 41 y.o.   MRN: [id number] ? ?Cough ?Associated symptoms include myalgias, postnasal drip, rhinorrhea, shortness of breath and wheezing. Pertinent negatives include no chills, ear pain, fever or sore throat.  ?The patient is a 41 YO female coming in for asthma flare up. ? ?Review of Systems  ?Constitutional:  Positive for activity change and appetite change. Negative for chills, fatigue, fever and unexpected weight change.  ?HENT:  Positive for congestion, postnasal drip and rhinorrhea. Negative for ear discharge, ear pain, sinus pressure, sinus pain, sneezing, sore throat, tinnitus, trouble swallowing and voice change.   ?Eyes: Negative.   ?Respiratory:  Positive for cough, shortness of breath and wheezing. Negative for chest tightness.   ?Cardiovascular: Negative.   ?Gastrointestinal: Negative.   ?Musculoskeletal:  Positive for myalgias.  ?Neurological: Negative.   ? ?Objective:  ?Physical Exam ?Constitutional:   ?   Appearance: She is well-developed.  ?HENT:  ?   Head: Normocephalic and atraumatic.  ?   Comments: Oropharynx with redness and clear drainage, nose with swollen turbinates, TMs normal bilaterally.  ?Neck:  ?   Thyroid: No thyromegaly.  ?Cardiovascular:  ?   Rate and Rhythm: Normal rate and regular rhythm.  ?Pulmonary:  ?   Effort: Pulmonary effort is normal. No respiratory distress.  ?   Breath sounds: Wheezing present. No rales.  ?Abdominal:  ?   General: Bowel sounds are normal. There is no distension.  ?   Palpations: Abdomen is soft.  ?   Tenderness: There is no abdominal tenderness. There is no rebound.  ?Musculoskeletal:     ?   General: Tenderness present.  ?   Cervical back: Normal range of motion.  ?Lymphadenopathy:  ?   Cervical: No cervical adenopathy.  ?Skin: ?   General: Skin is warm and dry.  ?Neurological:  ?   Mental Status: She is alert and oriented to person, place, and time.  ?   Coordination: Coordination  normal.  ? ? ?Vitals:  ? 12/09/21 1331  ?BP: 126/70  ?Pulse: (!) 104  ?Resp: 18  ?Temp: 98.3 ?F (36.8 ?C)  ?TempSrc: Oral  ?SpO2: 98%  ?Weight: 246 lb 6.4 oz (111.8 kg)  ?Height: 5' 3.25" (1.607 m)  ? ? ?This visit occurred during the SARS-CoV-2 public health emergency.  Safety protocols were in place, including screening questions prior to the visit, additional usage of staff PPE, and extensive cleaning of exam room while observing appropriate contact time as indicated for disinfecting solutions.  ? ?Assessment & Plan:  ? ?

## 2021-12-10 NOTE — Telephone Encounter (Signed)
Pt was unable to be reached by phone after multiple attempts: ?Letter was created and sent to pt via My Chart and via Mail ?

## 2021-12-11 ENCOUNTER — Telehealth: Payer: Self-pay

## 2021-12-11 NOTE — Telephone Encounter (Signed)
Pt given Dr. Carlean Purl recommendations to schedule an office visit: ?Pt scheduled an office visit for 01-08-2022 at 10:10 AM to see Dr. Carlean Purl: ?Pt made aware: ?Pt verbalized understanding with all questions answered.  ?

## 2021-12-25 ENCOUNTER — Other Ambulatory Visit (HOSPITAL_COMMUNITY): Payer: Self-pay

## 2021-12-25 MED ORDER — CLINDAMYCIN PHOSPHATE 1 % EX SOLN
CUTANEOUS | 3 refills | Status: DC
  Filled 2021-12-25: qty 60, 30d supply, fill #0

## 2021-12-25 MED ORDER — TRIAMCINOLONE ACETONIDE 0.1 % EX CREA
TOPICAL_CREAM | CUTANEOUS | 2 refills | Status: DC
  Filled 2021-12-25: qty 454, 30d supply, fill #0
  Filled 2022-12-04 – 2022-12-08 (×2): qty 454, 30d supply, fill #1

## 2021-12-26 ENCOUNTER — Other Ambulatory Visit (HOSPITAL_COMMUNITY): Payer: Self-pay

## 2021-12-27 ENCOUNTER — Other Ambulatory Visit (HOSPITAL_COMMUNITY): Payer: Self-pay

## 2021-12-27 ENCOUNTER — Other Ambulatory Visit: Payer: Self-pay | Admitting: Internal Medicine

## 2021-12-27 MED ORDER — ALBUTEROL SULFATE HFA 108 (90 BASE) MCG/ACT IN AERS
INHALATION_SPRAY | RESPIRATORY_TRACT | 2 refills | Status: DC
  Filled 2021-12-27: qty 18, 25d supply, fill #0

## 2022-01-08 ENCOUNTER — Ambulatory Visit: Payer: No Typology Code available for payment source | Admitting: Internal Medicine

## 2022-01-08 ENCOUNTER — Other Ambulatory Visit (INDEPENDENT_AMBULATORY_CARE_PROVIDER_SITE_OTHER): Payer: No Typology Code available for payment source

## 2022-01-08 ENCOUNTER — Encounter: Payer: Self-pay | Admitting: Internal Medicine

## 2022-01-08 VITALS — BP 90/70 | HR 102 | Ht 63.0 in | Wt 248.0 lb

## 2022-01-08 DIAGNOSIS — D500 Iron deficiency anemia secondary to blood loss (chronic): Secondary | ICD-10-CM

## 2022-01-08 DIAGNOSIS — K760 Fatty (change of) liver, not elsewhere classified: Secondary | ICD-10-CM | POA: Diagnosis not present

## 2022-01-08 LAB — CBC WITH DIFFERENTIAL/PLATELET
Basophils Absolute: 0.1 10*3/uL (ref 0.0–0.1)
Basophils Relative: 0.7 % (ref 0.0–3.0)
Eosinophils Absolute: 1 10*3/uL — ABNORMAL HIGH (ref 0.0–0.7)
Eosinophils Relative: 11.8 % — ABNORMAL HIGH (ref 0.0–5.0)
HCT: 34.1 % — ABNORMAL LOW (ref 36.0–46.0)
Hemoglobin: 10.9 g/dL — ABNORMAL LOW (ref 12.0–15.0)
Lymphocytes Relative: 24.4 % (ref 12.0–46.0)
Lymphs Abs: 2 10*3/uL (ref 0.7–4.0)
MCHC: 31.9 g/dL (ref 30.0–36.0)
MCV: 75 fl — ABNORMAL LOW (ref 78.0–100.0)
Monocytes Absolute: 0.5 10*3/uL (ref 0.1–1.0)
Monocytes Relative: 5.7 % (ref 3.0–12.0)
Neutro Abs: 4.8 10*3/uL (ref 1.4–7.7)
Neutrophils Relative %: 57.4 % (ref 43.0–77.0)
Platelets: 326 10*3/uL (ref 150.0–400.0)
RBC: 4.55 Mil/uL (ref 3.87–5.11)
RDW: 17.1 % — ABNORMAL HIGH (ref 11.5–15.5)
WBC: 8.3 10*3/uL (ref 4.0–10.5)

## 2022-01-08 LAB — FERRITIN: Ferritin: 8.7 ng/mL — ABNORMAL LOW (ref 10.0–291.0)

## 2022-01-08 NOTE — Patient Instructions (Addendum)
Two books about intermittent fasting: ? ?The Obesity Code by Dr. Sharman Cheek - WATCH HIS YOU TUBE CHANNEL DON'T BUY BOOK ? ?Intermittent Fasting Diet Guide and Cookbook by Dr. Alberteen Spindle -  GET THIS ONE ? ?STOP CANOLA OIL ? ?Your provider has requested that you go to the basement level for lab work before leaving today. Press "B" on the elevator. The lab is located at the first door on the left as you exit the elevator. ? ?Due to recent changes in healthcare laws, you may see the results of your imaging and laboratory studies on MyChart before your provider has had a chance to review them.  We understand that in some cases there may be results that are confusing or concerning to you. Not all laboratory results come back in the same time frame and the provider may be waiting for multiple results in order to interpret others.  Please give [place] 48 hours in order for your provider to thoroughly review all the results before contacting the office for clarification of your results.  ? ? ?I appreciate the opportunity to care for you. ?Silvano Rusk, MD, [facility] ?

## 2022-01-08 NOTE — Progress Notes (Signed)
? Deanna Schwartz 41 y.o. Nov 01, 1980 [id number] ? ?Assessment & Plan:  ? ?Encounter Diagnoses  ?Name Primary?  ? Fatty liver Yes  ? Severe obesity (BMI >= 40) (HCC)   ? Iron deficiency anemia due to chronic blood loss - menses   ? ?We had a good conversation about how difficult it is to treat obesity.  She is not interested in medications.  She has tried many different interventions and has had some success but has not been maintained and in fact she has gained weight.  It could be that the stress in her life is driving some of this as that is related.  Some of her insulin numbers suggest insulin resistance but the most recent ones do not.  She did not have fatty liver characterized on her most recent CT though the ultrasound suggested it.  She does seem to be at risk for this.  Fatty liver is a sign of poor metabolic health.  My interpretation of her CT scan is that she has both subcutaneous and visceral fat. ? ?I think she should continue to try to eat her cultural foods that she is used to it is great that she cooks at home.  She was given a handout that points out foods to eat that her lower carbohydrate, and to avoid seed oils (uses canola and is advised to stop).  We talked about time restricted eating and working towards that using a lower carbohydrate diet.  I.e. this is also known as intermittent fasting.  I cautioned her to take her time with this and work into it I think the 16 hours fasting 8 hours eating works Schwartz for most but per AVS I have given her a reference to look out and to explore this.  Hopefully this will help her feel better it may help her asthma as well. ? ? ?Lab Results  ?Component Value Date  ? WBC 8.3 01/08/2022  ? HGB 10.9 (L) 01/08/2022  ? HCT 34.1 (L) 01/08/2022  ? MCV 75.0 (L) 01/08/2022  ? PLT 326.0 01/08/2022  ? ?Lab Results  ?Component Value Date  ? FERRITIN 8.7 (L) 01/08/2022  ? ? ?Her iron deficiency anemia persists.  I had already encouraged her to follow-up with  gynecology to try to treat the menorrhagia as that would be curative.  It may help with her abdominal pains as well given that she has a nodular uterus consistent with fibroids. ? ?We will contact her and suggest she return to hematology for consideration of repeat parenteral iron therapy. ? ?She will follow-up with me by MyChart with updates in her progress with changing eating. ? ? ?Subjective:  ? ?Chief Complaint: Follow-up after imaging, abdominal pain issues ? ?HPI ?Deanna Schwartz is a 40 year old woman who has had problems with chronic abdominal pain, iron deficiency anemia due to menorrhagia and fibroids, and suspected irritable bowel syndrome at times.  She also has obesity.  She is here to discuss all of these. ? ?She was most recently seen in our office by Deanna Hardge Best, NP, September 27, 2021.  She was having right upper quadrant pain dull radiating to the right lower quadrant not related to eating, some nausea without vomiting and weight gain.  At that time it was reviewed that she had had a right upper quadrant ultrasound in January 2022 with increased parenchymal echogenicity consistent with hepatic steatosis.  EGD in 2013 with diminutive benign polyps and a 2 cm hiatal hernia.  Sigmoidoscopy 2009 normal exam (abdominal pain  and rectal bleeding or indications). ? ?She still has some of these pains but perhaps not as bad.  She had a CT of the abdomen and pelvis 10/11/2021 with nodular enlargement of the uterus consistent with fibroids otherwise unremarkable.  They did not comment that she had fatty liver changes. ? ?Labs at that time demonstrated normal lipase, normal CMET, hemoglobin 10.7 MCV 75 negative H. pylori stool antigen.  She was taking a multivitamin plus a prenatal vitamin to try to replete her iron.  She has had iron treatment with parenteral infusions in the past.  She was contemplating hysterectomy but has not done so.  She says she needs to follow-up with her gynecologist.  She has heavy  bleeding 5 days out of the month she thinks that is gotten progressively worse.  She does not have signs of GI bleeding.  She does complain of fatigue. ? ?She is frustrated with inability to lose weight despite going to the weight loss clinic, she was able to lose weight at 1 point in her life where she took some supplements and walked a lot but she gained all the weight back when that stopped.  Last visit to the [facility] was in December 2022 and she decided not to return because they wanted her to eat foods that were not from her culture which she tries to eat.  She says she really does not eat out she does not drink any sugary beverages.  She cooks almost all of her own food and tries to eat healthy with vegetables beans rice and different meats.  I asked but she was not able to quantify carbohydrates it sounds like maybe not too many processed foods. She drinks about 4 glasses of wine a week she says.  She also has tried weight watchers that did not help. ? ?She has had insulin levels checked at times in 2017 it was 13, it was 14.6 in 20 1810.4 in 2022 (November).  I am not sure if these were fasting or not probably not given the times except for the level that was 14.6.  Thyroid testing was normal in November 2022 also.  Hemoglobin A1c 5.6. ? ?She does suffer with anxiety and feels stressed out.  This has been ongoing for years. ? ?Wt Readings from Last 3 Encounters:  ?01/08/22 248 lb (112.5 kg)  ?12/09/21 246 lb 6.4 oz (111.8 kg)  ?09/27/21 249 lb 4 oz (113.1 kg)  ? ? ? ?Allergies  ?Allergen Reactions  ? Augmentin [Amoxicillin-Pot Clavulanate] Rash  ? Cephalexin Rash  ? Iodine Swelling and Rash  ?  Throat swelling after eating shrimp  ? ?Current Meds  ?Medication Sig  ? albuterol (VENTOLIN HFA) 108 (90 Base) MCG/ACT inhaler INHALE 1 - 2 PUFFS INTO THE LUNGS EVERY 6 HOURS AS NEEDED FOR WHEEZING OR SHORTNESS OF BREATH  ? budesonide-formoterol (SYMBICORT) 160-4.5 MCG/ACT inhaler  Inhale 2 puffs into the lungs 2 (two) times daily.  ? cetirizine (ZYRTEC) 10 MG tablet Take 1 tablet (10 mg total) by mouth daily.  ? Cholecalciferol (VITAMIN D3) 50 MCG (2000 UT) capsule Take 2 capsules (4,000 Units total) by mouth daily. (Patient taking differently: Take 5,000 Units by mouth daily.)  ? clindamycin (CLEOCIN T) 1 % external solution Apply to the affected area 2 times a day at the first sign of bump  ? Cyanocobalamin (B-12) 1000 MCG CAPS Take by mouth.  ? dicyclomine (BENTYL) 10 MG capsule Take 1 capsule (10 mg total) by  mouth 3 (three) times daily as needed for spasms (abdominal pain).  ? EPINEPHrine (EPIPEN 2-PAK) 0.3 mg/0.3 mL IJ SOAJ injection Inject 1 pen (0.3 mg) into the muscle as needed for anaphylaxis.  ? Magnesium 250 MG TABS Take 250 mg by mouth.  ? montelukast (SINGULAIR) 10 MG tablet Take 1 tablet (10 mg total) by mouth at bedtime.  ? Prenatal Vit-Fe Fumarate-FA (PRENATAL VITAMINS PO) Take 1 tablet by mouth daily.  ? sertraline (ZOLOFT) 50 MG tablet Take 1/2 tablet (25 mg total) by mouth daily. (Patient taking differently: Take 25 mg by mouth as needed.)  ? triamcinolone cream (KENALOG) 0.1 % Apply up to twice daily to rash on body 2 weeks on, 2 weeks off as needed for flares  ? ?Past Medical History:  ?Diagnosis Date  ? Allergic rhinitis   ? Anemia   ? Anxiety   ? Asthma   ? 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 New 03/2011   ? Chronic constipation   ? Constipation   ? Depression   ? GERD (gastroesophageal reflux disease)   ? H. pylori infection 2008  ? Hx of tx + serology  ? IBS (irritable bowel syndrome)   ? Iron deficiency anemia 08/06/2011  ? Hgb 10.2 and MCV 79 on health screen labs   ? Irritable bowel syndrome   ? Knee pain   ? Obesity   ? Palpitations   ? Panic attack   ? Prediabetes   ? Shortness of breath   ? Ulcer   ? Vitamin D deficiency   ? ?Past Surgical History:  ?Procedure Laterality Date  ? FLEXIBLE SIGMOIDOSCOPY  04/14/2008  ? normal  ? NASAL  SINUS SURGERY    ? UPPER GASTROINTESTINAL ENDOSCOPY    ? ?Social History  ? ?Social History Narrative  ? [employer], [employer] college in [place]. Occupation: [employer] married  in Oct 2009. Son born in

## 2022-01-10 ENCOUNTER — Telehealth: Payer: Self-pay | Admitting: Oncology

## 2022-01-10 ENCOUNTER — Other Ambulatory Visit: Payer: Self-pay

## 2022-01-10 DIAGNOSIS — D500 Iron deficiency anemia secondary to blood loss (chronic): Secondary | ICD-10-CM

## 2022-01-10 NOTE — Telephone Encounter (Signed)
Scheduled appt per 5/5 referral. Pt is aware of appt date and time. Pt is aware to arrive 15 mins prior to appt time and to bring and updated insurance card. Pt is aware of appt location.   ?

## 2022-01-20 ENCOUNTER — Encounter: Payer: Self-pay | Admitting: Internal Medicine

## 2022-01-20 ENCOUNTER — Other Ambulatory Visit (HOSPITAL_COMMUNITY): Payer: Self-pay

## 2022-01-20 ENCOUNTER — Ambulatory Visit (INDEPENDENT_AMBULATORY_CARE_PROVIDER_SITE_OTHER): Payer: No Typology Code available for payment source | Admitting: Internal Medicine

## 2022-01-20 VITALS — BP 130/70 | HR 101 | Temp 98.6°F | Ht 63.0 in | Wt 248.0 lb

## 2022-01-20 DIAGNOSIS — M549 Dorsalgia, unspecified: Secondary | ICD-10-CM

## 2022-01-20 DIAGNOSIS — J4541 Moderate persistent asthma with (acute) exacerbation: Secondary | ICD-10-CM

## 2022-01-20 DIAGNOSIS — J019 Acute sinusitis, unspecified: Secondary | ICD-10-CM

## 2022-01-20 MED ORDER — ALBUTEROL SULFATE HFA 108 (90 BASE) MCG/ACT IN AERS
INHALATION_SPRAY | RESPIRATORY_TRACT | 2 refills | Status: DC
  Filled 2022-01-20: qty 18, 15d supply, fill #0
  Filled 2022-02-07: qty 18, 15d supply, fill #1
  Filled 2022-02-28: qty 18, 15d supply, fill #2

## 2022-01-20 MED ORDER — AZITHROMYCIN 250 MG PO TABS
ORAL_TABLET | ORAL | 0 refills | Status: DC
  Filled 2022-01-20: qty 6, 5d supply, fill #0

## 2022-01-20 MED ORDER — PREDNISONE 20 MG PO TABS
40.0000 mg | ORAL_TABLET | Freq: Every day | ORAL | 0 refills | Status: DC
  Filled 2022-01-20: qty 14, 7d supply, fill #0

## 2022-01-20 NOTE — Patient Instructions (Addendum)
? ? ? ? ?  Medications changes include :   zpak, prednisone 40 mg daily for 5-7 days ? ? ?Your prescription(s) have been sent to your pharmacy.  ? ? ?A referral was ordered for sports medicine - Dr Glennon Mac - left mid back pain.     Someone from that office will call you to schedule an appointment.  ? ? ?Return if symptoms worsen or fail to improve. ? ?

## 2022-01-20 NOTE — Progress Notes (Signed)
? ? ?Subjective:  ? ? Patient ID: Deanna Schwartz, female    DOB: Mar 27, 1981, 41 y.o.   MRN: [id number] ? ? ? ? ? ?HPI ?Alleta is here for  ?Chief Complaint  ?Patient presents with  ? Asthma  ? Left arm  ?  Left arm pain last night (left lower are was sore but not today)  ? ? ? ?Asthma - her asthma is flared up again.  She is unsure if she has a cold or if it is allergies.  She takes the symbicort morning and night.  She also takes her Singulair daily, Zyrtec daily and is using her albuterol as needed.  Recently she has been using it more.  She wheezes at night and hear rattling in her chest at night.  She coughs sometimes, but it is more her making herself cough to try to clear mucus she feels in her airway.  She does have some tightness in her chest.  She has nasal congestion, sinus pressure, postnasal drainage and dizziness. ? ? ?Last flare beginning of April.  Several flares in the past 6 months.  Only difference is she has a new dog.   ? ? ? ?Last night woke up with left arm feeling asleep and ached.  She was sleeping on it. It lasted two hours.  She took ibuprofen and it did not help.  Fell asleep and this morning had an ache in the arm, but it resolved.   ? ? ? ?Medications and allergies reviewed with patient and updated if appropriate. ? ?Current Outpatient Medications on File Prior to Visit  ?Medication Sig Dispense Refill  ? albuterol (VENTOLIN HFA) 108 (90 Base) MCG/ACT inhaler INHALE 1 - 2 PUFFS INTO THE LUNGS EVERY 6 HOURS AS NEEDED FOR WHEEZING OR SHORTNESS OF BREATH 18 g 2  ? budesonide-formoterol (SYMBICORT) 160-4.5 MCG/ACT inhaler Inhale 2 puffs into the lungs 2 (two) times daily. 30.6 g 3  ? cetirizine (ZYRTEC) 10 MG tablet Take 1 tablet (10 mg total) by mouth daily. 90 tablet 3  ? Cholecalciferol (VITAMIN D3) 50 MCG (2000 UT) capsule Take 2 capsules (4,000 Units total) by mouth daily. (Patient taking differently: Take 5,000 Units by mouth daily.) 200 capsule 3  ? clindamycin (CLEOCIN T) 1 %  external solution Apply to the affected area 2 times a day at the first sign of bump 60 mL 3  ? Cyanocobalamin (B-12) 1000 MCG CAPS Take by mouth.    ? dicyclomine (BENTYL) 10 MG capsule Take 1 capsule (10 mg total) by mouth 3 (three) times daily as needed for spasms (abdominal pain). 30 capsule 1  ? EPINEPHrine (EPIPEN 2-PAK) 0.3 mg/0.3 mL IJ SOAJ injection Inject 1 pen (0.3 mg) into the muscle as needed for anaphylaxis. 2 each 3  ? Magnesium 250 MG TABS Take 250 mg by mouth.    ? montelukast (SINGULAIR) 10 MG tablet Take 1 tablet (10 mg total) by mouth at bedtime. 90 tablet 3  ? Prenatal Vit-Fe Fumarate-FA (PRENATAL VITAMINS PO) Take 1 tablet by mouth daily.    ? sertraline (ZOLOFT) 50 MG tablet Take 1/2 tablet (25 mg total) by mouth daily. (Patient taking differently: Take 25 mg by mouth as needed.) 30 tablet 0  ? triamcinolone cream (KENALOG) 0.1 % Apply up to twice daily to rash on body 2 weeks on, 2 weeks off as needed for flares 454 g 2  ? ?No current facility-administered medications on file prior to visit.  ? ? ?Review of Systems  ?  Constitutional:  Negative for chills and fever.  ?HENT:  Positive for congestion, postnasal drip and sinus pressure. Negative for ear pain and sore throat.   ?Respiratory:  Positive for chest tightness, shortness of breath and wheezing (rattling in chest). Negative for cough (she makes herself cough - feels like she has phlegm in airway).   ?Gastrointestinal:  Negative for nausea.  ?Neurological:  Positive for dizziness (when she gets up in the morning). Negative for light-headedness and headaches.  ? ?   ?Objective:  ? ?Vitals:  ? 01/20/22 1414  ?BP: 130/70  ?Pulse: (!) 101  ?Temp: 98.6 ?F (37 ?C)  ?SpO2: 97%  ? ?BP Readings from Last 3 Encounters:  ?01/20/22 130/70  ?01/08/22 90/70  ?12/09/21 126/70  ? ?Wt Readings from Last 3 Encounters:  ?01/20/22 248 lb (112.5 kg)  ?01/08/22 248 lb (112.5 kg)  ?12/09/21 246 lb 6.4 oz (111.8 kg)  ? ?Body mass index is 43.93 kg/m?. ? ?   ?Physical Exam ?Constitutional:   ?   General: She is not in acute distress. ?   Appearance: Normal appearance. She is not ill-appearing.  ?HENT:  ?   Head: Normocephalic and atraumatic.  ?   Right Ear: Tympanic membrane, ear canal and external ear normal.  ?   Left Ear: Tympanic membrane, ear canal and external ear normal.  ?   Mouth/Throat:  ?   Mouth: Mucous membranes are moist.  ?   Pharynx: No oropharyngeal exudate or posterior oropharyngeal erythema.  ?Eyes:  ?   Conjunctiva/sclera: Conjunctivae normal.  ?Cardiovascular:  ?   Rate and Rhythm: Normal rate and regular rhythm.  ?Pulmonary:  ?   Effort: Pulmonary effort is normal. No respiratory distress.  ?   Breath sounds: Wheezing (Diffuse expiratory wheeze) present. No rales.  ?Musculoskeletal:  ?   Cervical back: Neck supple. No tenderness.  ?Lymphadenopathy:  ?   Cervical: No cervical adenopathy.  ?Skin: ?   General: Skin is warm and dry.  ?Neurological:  ?   Mental Status: She is alert.  ? ?   ? ? ? ? ? ?Assessment & Plan:  ? ? ?Asthma exacerbation, sinus infection: ?Acute ?Experiencing symptoms consistent with sinus infection and asthma exacerbation ?Diffuse expiratory wheeze on exam ?Start Z-Pak, prednisone 40 mg daily x5-7 days ?Continue Symbicort 160-4.5 mcg per ACT 2 puffs twice daily, albuterol as needed, Singulair, Zyrtec ?She has had several exacerbations in the last 6 months-discussed possibly going to allergy for further evaluation/treatment-?  Did get a new dog in the last 6 months which could be part of the cause ?She has seen allergy in the past and will consider going back there ? ?Left mid back pain: ?Subacute ?This is been going on for a while ?Had CT of the abdomen and pelvis February 2023 and no cause was found ?Pain does sound musculoskeletal in nature ?She will pay attention to see if the prednisone helps ?Advise seeing Dr. Glennon Mac downstairs for further evaluation/treatment-referral ordered ? ? ? ? ?

## 2022-01-21 ENCOUNTER — Inpatient Hospital Stay: Payer: No Typology Code available for payment source | Admitting: Oncology

## 2022-01-22 ENCOUNTER — Inpatient Hospital Stay: Payer: No Typology Code available for payment source | Attending: Oncology | Admitting: Oncology

## 2022-01-22 ENCOUNTER — Other Ambulatory Visit: Payer: Self-pay

## 2022-01-22 VITALS — BP 120/58 | HR 89 | Temp 97.7°F | Resp 19 | Ht 63.0 in | Wt 248.2 lb

## 2022-01-22 DIAGNOSIS — D500 Iron deficiency anemia secondary to blood loss (chronic): Secondary | ICD-10-CM | POA: Diagnosis present

## 2022-01-22 DIAGNOSIS — N920 Excessive and frequent menstruation with regular cycle: Secondary | ICD-10-CM | POA: Insufficient documentation

## 2022-01-22 DIAGNOSIS — Z79899 Other long term (current) drug therapy: Secondary | ICD-10-CM | POA: Diagnosis not present

## 2022-01-22 NOTE — Progress Notes (Signed)
Hematology and Oncology Follow Up Visit ? ?Deanna Schwartz ?[id number] ?[birth year] 41 y.o. ?01/22/2022 2:59 PM ?Plotnikov, Evie Lacks, MDGessner, Ofilia Neas, MD  ? ?Principle Diagnosis: 41 year old woman with iron deficiency anemia related to chronic menstrual blood losses diagnosed in 2019.  . ? ? ? ?Prior Therapy:  ? ?Status post Feraheme infusion completed on Jan 06, 2018 for a total of 1020 mg. ? ?She is status post ferric gluconate for a total of 375 mg completed in October 2021. ? ?Current therapy: Oral iron daily. ? ?Interim History: Ms. Deanna Schwartz presents today for return evaluation.  Since last visit, she had laboratory testing in May 2023 which showed a hemoglobin of 10.9 and ferritin of 8.7.  Her MCV was 75.  Previous iron studies in November 2022 showed iron of 35 and ferritin of 18. ? ?Clinically, she reports feeling fatigue and weak at times with occasional dizziness.  She continues to have heavy menstrual bleeding.  She denies any hematochezia or melena.  She denies any complications related to oral iron therapy at this time. ? ? ? ? ? ? ? ?Medications: Unchanged on review. ?Current Outpatient Medications  ?Medication Sig Dispense Refill  ? albuterol (VENTOLIN HFA) 108 (90 Base) MCG/ACT inhaler INHALE 1 - 2 PUFFS INTO THE LUNGS EVERY 6 HOURS AS NEEDED FOR WHEEZING OR SHORTNESS OF BREATH 18 g 2  ? azithromycin (ZITHROMAX) 250 MG tablet Take two tablets the first day and then one tablet daily for four days 6 tablet 0  ? budesonide-formoterol (SYMBICORT) 160-4.5 MCG/ACT inhaler Inhale 2 puffs into the lungs 2 (two) times daily. 30.6 g 3  ? cetirizine (ZYRTEC) 10 MG tablet Take 1 tablet (10 mg total) by mouth daily. 90 tablet 3  ? Cholecalciferol (VITAMIN D3) 50 MCG (2000 UT) capsule Take 2 capsules (4,000 Units total) by mouth daily. (Patient taking differently: Take 5,000 Units by mouth daily.) 200 capsule 3  ? clindamycin (CLEOCIN T) 1 % external solution Apply to the affected area 2 times a day at the first  sign of bump 60 mL 3  ? Cyanocobalamin (B-12) 1000 MCG CAPS Take by mouth.    ? dicyclomine (BENTYL) 10 MG capsule Take 1 capsule (10 mg total) by mouth 3 (three) times daily as needed for spasms (abdominal pain). 30 capsule 1  ? EPINEPHrine (EPIPEN 2-PAK) 0.3 mg/0.3 mL IJ SOAJ injection Inject 1 pen (0.3 mg) into the muscle as needed for anaphylaxis. 2 each 3  ? Magnesium 250 MG TABS Take 250 mg by mouth.    ? montelukast (SINGULAIR) 10 MG tablet Take 1 tablet (10 mg total) by mouth at bedtime. 90 tablet 3  ? predniSONE (DELTASONE) 20 MG tablet Take 2 tablets by mouth daily with breakfast. 14 tablet 0  ? Prenatal Vit-Fe Fumarate-FA (PRENATAL VITAMINS PO) Take 1 tablet by mouth daily.    ? sertraline (ZOLOFT) 50 MG tablet Take 1/2 tablet (25 mg total) by mouth daily. (Patient taking differently: Take 25 mg by mouth as needed.) 30 tablet 0  ? triamcinolone cream (KENALOG) 0.1 % Apply up to twice daily to rash on body 2 weeks on, 2 weeks off as needed for flares 454 g 2  ? ?No current facility-administered medications for this visit.  ? ? ? ?Allergies:  ?Allergies  ?Allergen Reactions  ? Augmentin [Amoxicillin-Pot Clavulanate] Rash  ? Cephalexin Rash  ? Iodine Swelling and Rash  ?  Throat swelling after eating shrimp  ? ? ? ? ? ? ?Physical Exam: ? ? ?  Blood pressure (!) 120/58, pulse 89, temperature 97.7 ?F (36.5 ?C), temperature source Temporal, resp. rate 19, height '5\' 3"'$  (1.6 m), weight 248 lb 3.2 oz (112.6 kg), SpO2 100 %. ? ? ? ?ECOG: 0 ?  ? ? ?General appearance: Comfortable appearing without any discomfort ?Head: Normocephalic without any trauma ?Oropharynx: Mucous membranes are moist and pink without any thrush or ulcers. ?Eyes: Pupils are equal and round reactive to light. ?Lymph nodes: No cervical, supraclavicular, inguinal or axillary lymphadenopathy.   ?Heart:regular rate and rhythm.  S1 and S2 without leg edema. ?Lung: Clear without any rhonchi or wheezes.  No dullness to percussion. ?Abdomin: Soft,  nontender, nondistended with good bowel sounds.  No hepatosplenomegaly. ?Musculoskeletal: No joint deformity or effusion.  Full range of motion noted. ?Neurological: No deficits noted on motor, sensory and deep tendon reflex exam. ?Skin: No petechial rash or dryness.  Appeared moist.  ? ? ? ? ?Lab Results: ?Lab Results  ?Component Value Date  ? WBC 8.3 01/08/2022  ? HGB 10.9 (L) 01/08/2022  ? HCT 34.1 (L) 01/08/2022  ? MCV 75.0 (L) 01/08/2022  ? PLT 326.0 01/08/2022  ? ?  Chemistry   ?   ?Component Value Date/Time  ? NA 134 (L) 09/27/2021 1559  ? NA 136 07/23/2021 1612  ? K 3.8 09/27/2021 1559  ? CL 101 09/27/2021 1559  ? CO2 28 09/27/2021 1559  ? BUN 10 09/27/2021 1559  ? BUN 12 07/23/2021 1612  ? CREATININE 0.83 09/27/2021 1559  ? CREATININE 0.82 01/25/2021 0936  ?    ?Component Value Date/Time  ? CALCIUM 9.2 09/27/2021 1559  ? ALKPHOS 58 09/27/2021 1559  ? AST 15 09/27/2021 1559  ? AST 25 12/28/2017 1114  ? ALT 12 09/27/2021 1559  ? ALT 21 12/28/2017 1114  ? BILITOT 0.3 09/27/2021 1559  ? BILITOT 0.3 07/23/2021 1612  ? BILITOT 0.3 12/28/2017 1114  ?  ? ? ? ?Impression and Plan: ? ? ?42 year old woman with:  ? ?1.  Iron deficiency anemia diagnosed in 2019.  This was related to chronic menstrual blood losses.   ? ?The natural course of her disease was reviewed and treatment choices were reiterated.  She has developed a worsening iron deficiency despite previous IV iron treatments.  Risks and benefits of repeating intravenous iron infusion were reviewed.  She is agreeable to proceed and we will schedule Feraheme infusion in the near future.  Arthralgias, myalgias and rarely anaphylaxis were reiterated at this time. ? ? ? ?2.  Heavy menstrual cycles: She is considering a partial hysterectomy at this time. ? ?3.  Follow-up: Will be in 6 months for repeat follow-up. ? ?30  minutes were dedicated to this encounter.  Time spent on reviewing laboratory data, management choices and addressing complications related to her  therapy. ? ? ? ? ?Zola Button, MD ?5/17/20232:59 PM ?

## 2022-01-23 ENCOUNTER — Telehealth: Payer: Self-pay | Admitting: Oncology

## 2022-01-23 NOTE — Telephone Encounter (Signed)
Scheduled per 05/17 los, patient has been called and voicemail was left.

## 2022-01-27 NOTE — Progress Notes (Unsigned)
    Benito Mccreedy D.[facility] Phone: [phone]   Assessment and Plan:     There are no diagnoses linked to this encounter.  ***   Pertinent previous records reviewed include ***   Follow Up: ***     Subjective:   I, Ada Woodbury, am serving as a Education administrator for Doctor Glennon Mac  Chief Complaint: left sided low back pain   HPI:   01/31/2022 Patient is a 41 year old female complaining of left sided low back pain. Patient states  Relevant Historical Information: ***  Additional pertinent review of systems negative.   Current Outpatient Medications:    albuterol (VENTOLIN HFA) 108 (90 Base) MCG/ACT inhaler, INHALE 1 - 2 PUFFS INTO THE LUNGS EVERY 6 HOURS AS NEEDED FOR WHEEZING OR SHORTNESS OF BREATH, Disp: 18 g, Rfl: 2   azithromycin (ZITHROMAX) 250 MG tablet, Take two tablets the first day and then one tablet daily for four days, Disp: 6 tablet, Rfl: 0   budesonide-formoterol (SYMBICORT) 160-4.5 MCG/ACT inhaler, Inhale 2 puffs into the lungs 2 (two) times daily., Disp: 30.6 g, Rfl: 3   cetirizine (ZYRTEC) 10 MG tablet, Take 1 tablet (10 mg total) by mouth daily., Disp: 90 tablet, Rfl: 3   Cholecalciferol (VITAMIN D3) 50 MCG (2000 UT) capsule, Take 2 capsules (4,000 Units total) by mouth daily. (Patient taking differently: Take 5,000 Units by mouth daily.), Disp: 200 capsule, Rfl: 3   clindamycin (CLEOCIN T) 1 % external solution, Apply to the affected area 2 times a day at the first sign of bump, Disp: 60 mL, Rfl: 3   Cyanocobalamin (B-12) 1000 MCG CAPS, Take by mouth., Disp: , Rfl:    dicyclomine (BENTYL) 10 MG capsule, Take 1 capsule (10 mg total) by mouth 3 (three) times daily as needed for spasms (abdominal pain)., Disp: 30 capsule, Rfl: 1   EPINEPHrine (EPIPEN 2-PAK) 0.3 mg/0.3 mL IJ SOAJ injection, Inject 1 pen (0.3 mg) into the muscle as needed for anaphylaxis., Disp: 2 each, Rfl: 3   Magnesium  250 MG TABS, Take 250 mg by mouth., Disp: , Rfl:    montelukast (SINGULAIR) 10 MG tablet, Take 1 tablet (10 mg total) by mouth at bedtime., Disp: 90 tablet, Rfl: 3   predniSONE (DELTASONE) 20 MG tablet, Take 2 tablets by mouth daily with breakfast., Disp: 14 tablet, Rfl: 0   Prenatal Vit-Fe Fumarate-FA (PRENATAL VITAMINS PO), Take 1 tablet by mouth daily., Disp: , Rfl:    sertraline (ZOLOFT) 50 MG tablet, Take 1/2 tablet (25 mg total) by mouth daily. (Patient taking differently: Take 25 mg by mouth as needed.), Disp: 30 tablet, Rfl: 0   triamcinolone cream (KENALOG) 0.1 %, Apply up to twice daily to rash on body 2 weeks on, 2 weeks off as needed for flares, Disp: 454 g, Rfl: 2   Objective:     There were no vitals filed for this visit.    There is no height or weight on file to calculate BMI.    Physical Exam:    ***   Electronically signed by:  Benito Mccreedy D.[facility] 11:08 AM 01/27/22

## 2022-01-30 ENCOUNTER — Inpatient Hospital Stay: Payer: No Typology Code available for payment source

## 2022-01-30 ENCOUNTER — Other Ambulatory Visit: Payer: Self-pay

## 2022-01-30 VITALS — BP 116/50 | HR 79 | Temp 98.4°F | Resp 18

## 2022-01-30 DIAGNOSIS — D500 Iron deficiency anemia secondary to blood loss (chronic): Secondary | ICD-10-CM | POA: Diagnosis not present

## 2022-01-30 MED ORDER — SODIUM CHLORIDE 0.9 % IV SOLN: Freq: Once | INTRAVENOUS | Status: AC

## 2022-01-30 MED ORDER — SODIUM CHLORIDE 0.9 % IV SOLN
200.0000 mg | Freq: Once | INTRAVENOUS | Status: AC
  Administered 2022-01-30: 200 mg via INTRAVENOUS
  Filled 2022-01-30: qty 200

## 2022-01-30 NOTE — Patient Instructions (Signed)
Sodium Ferric Gluconate Complex injection What is this medicine? SODIUM FERRIC GLUCONATE COMPLEX (SOE dee um FER ik GLOO koe nate KOM pleks) is an iron replacement. It is used with epoetin therapy to treat low iron levels in patients who are receiving hemodialysis. This medicine may be used for other purposes; ask your health care provider or pharmacist if you have questions. COMMON BRAND NAME(S): Ferrlecit, Nulecit What should I tell my health care provider before I take this medicine? They need to know if you have any of the following conditions:  anemia that is not from iron deficiency  high levels of iron in the body  an unusual or allergic reaction to iron, benzyl alcohol, other medicines, foods, dyes, or preservatives  pregnant or are trying to become pregnant  breast-feeding How should I use this medicine? This medicine is for infusion into a vein. It is given by a health care professional in a hospital or clinic setting. Talk to your pediatrician regarding the use of this medicine in children. While this drug may be prescribed for children as young as 6 years old for selected conditions, precautions do apply. Overdosage: If you think you have taken too much of this medicine contact a poison control center or emergency room at once. NOTE: This medicine is only for you. Do not share this medicine with others. What if I miss a dose? It is important not to miss your dose. Call your doctor or health care professional if you are unable to keep an appointment. What may interact with this medicine? Do not take this medicine with any of the following medications:  deferoxamine  dimercaprol  other iron products This medicine may also interact with the following medications:  chloramphenicol  deferasirox  medicine for blood pressure like enalapril This list may not describe all possible interactions. Give your health care provider a list of all the medicines, herbs,  non-prescription drugs, or dietary supplements you use. Also tell them if you smoke, drink alcohol, or use illegal drugs. Some items may interact with your medicine. What should I watch for while using this medicine? Your condition will be monitored carefully while you are receiving this medicine. Visit your doctor for check-ups as directed. What side effects may I notice from receiving this medicine? Side effects that you should report to your doctor or health care professional as soon as possible:  allergic reactions like skin rash, itching or hives, swelling of the face, lips, or tongue  breathing problems  changes in hearing  changes in vision  chills, flushing, or sweating  fast, irregular heartbeat  feeling faint or lightheaded, falls  fever, flu-like symptoms  high or low blood pressure  pain, tingling, numbness in the hands or feet  severe pain in the chest, back, flanks, or groin  swelling of the ankles, feet, hands  trouble passing urine or change in the amount of urine  unusually weak or tired Side effects that usually do not require medical attention (report to your doctor or health care professional if they continue or are bothersome):  cramps  dark colored stools  diarrhea  headache  nausea, vomiting  stomach upset This list may not describe all possible side effects. Call your doctor for medical advice about side effects. You may report side effects to FDA at 1-800-FDA-1088. Where should I keep my medicine? This drug is given in a hospital or clinic and will not be stored at home. NOTE: This sheet is a summary. It may not cover all   possible information. If you have questions about this medicine, talk to your doctor, pharmacist, or health care provider.  2020 Elsevier/Gold Standard (2008-04-26 15:58:57)  

## 2022-01-31 ENCOUNTER — Ambulatory Visit: Payer: No Typology Code available for payment source | Admitting: Sports Medicine

## 2022-02-07 ENCOUNTER — Other Ambulatory Visit (HOSPITAL_COMMUNITY): Payer: Self-pay

## 2022-02-07 ENCOUNTER — Other Ambulatory Visit: Payer: Self-pay

## 2022-02-07 ENCOUNTER — Inpatient Hospital Stay: Payer: No Typology Code available for payment source | Attending: Oncology

## 2022-02-07 VITALS — BP 121/59 | HR 81 | Temp 98.1°F | Resp 18

## 2022-02-07 DIAGNOSIS — N920 Excessive and frequent menstruation with regular cycle: Secondary | ICD-10-CM | POA: Insufficient documentation

## 2022-02-07 DIAGNOSIS — D500 Iron deficiency anemia secondary to blood loss (chronic): Secondary | ICD-10-CM | POA: Diagnosis present

## 2022-02-07 MED ORDER — SODIUM CHLORIDE 0.9 % IV SOLN
200.0000 mg | Freq: Once | INTRAVENOUS | Status: AC
  Administered 2022-02-07: 200 mg via INTRAVENOUS
  Filled 2022-02-07: qty 200

## 2022-02-07 MED ORDER — SODIUM CHLORIDE 0.9 % IV SOLN: Freq: Once | INTRAVENOUS | Status: AC

## 2022-02-14 ENCOUNTER — Other Ambulatory Visit: Payer: Self-pay

## 2022-02-14 ENCOUNTER — Inpatient Hospital Stay: Payer: No Typology Code available for payment source

## 2022-02-14 VITALS — BP 123/72 | HR 96 | Temp 98.1°F | Resp 16

## 2022-02-14 DIAGNOSIS — D500 Iron deficiency anemia secondary to blood loss (chronic): Secondary | ICD-10-CM

## 2022-02-14 MED ORDER — SODIUM CHLORIDE 0.9 % IV SOLN
200.0000 mg | Freq: Once | INTRAVENOUS | Status: AC
  Administered 2022-02-14: 200 mg via INTRAVENOUS
  Filled 2022-02-14: qty 200

## 2022-02-14 MED ORDER — SODIUM CHLORIDE 0.9 % IV SOLN: Freq: Once | INTRAVENOUS | Status: AC

## 2022-02-14 NOTE — Patient Instructions (Signed)

## 2022-02-14 NOTE — Progress Notes (Signed)
Patient stayed for 30 minute post-iron observation. Vitals stable, ambulatory to lobby. 

## 2022-02-18 ENCOUNTER — Ambulatory Visit
Admission: EM | Admit: 2022-02-18 | Discharge: 2022-02-18 | Disposition: A | Discharge status: Home / Self Care | Payer: No Typology Code available for payment source | Attending: Emergency Medicine | Admitting: Emergency Medicine

## 2022-02-18 ENCOUNTER — Other Ambulatory Visit: Payer: Self-pay

## 2022-02-18 ENCOUNTER — Telehealth: Payer: Self-pay

## 2022-02-18 DIAGNOSIS — R079 Chest pain, unspecified: Secondary | ICD-10-CM

## 2022-02-18 DIAGNOSIS — H66001 Acute suppurative otitis media without spontaneous rupture of ear drum, right ear: Secondary | ICD-10-CM

## 2022-02-18 DIAGNOSIS — J4541 Moderate persistent asthma with (acute) exacerbation: Secondary | ICD-10-CM | POA: Diagnosis not present

## 2022-02-18 DIAGNOSIS — J3089 Other allergic rhinitis: Secondary | ICD-10-CM

## 2022-02-18 MED ORDER — CETIRIZINE HCL 10 MG PO TABS: 10.0000 mg | ORAL_TABLET | Freq: Every day | ORAL | 2 refills | Status: DC

## 2022-02-18 MED ORDER — FLUTICASONE PROPIONATE 50 MCG/ACT NA SUSP
1.0000 | Freq: Every day | NASAL | 1 refills | Status: DC
  Filled 2022-02-22: qty 48, 90d supply, fill #0

## 2022-02-18 MED ORDER — METHYLPREDNISOLONE 4 MG PO TBPK
ORAL_TABLET | ORAL | 0 refills | Status: DC
  Filled 2022-02-21: qty 21, 6d supply, fill #0

## 2022-02-18 MED ORDER — AZITHROMYCIN 250 MG PO TABS: ORAL_TABLET | ORAL | 0 refills | Status: AC

## 2022-02-18 MED ORDER — DEXAMETHASONE SODIUM PHOSPHATE 10 MG/ML IJ SOLN
20.0000 mg | Freq: Once | INTRAMUSCULAR | Status: AC
  Administered 2022-02-18: 20 mg via INTRAMUSCULAR

## 2022-02-18 MED ORDER — MONTELUKAST SODIUM 10 MG PO TABS
10.0000 mg | ORAL_TABLET | Freq: Every day | ORAL | 2 refills | Status: DC
  Filled 2022-02-21: qty 30, 30d supply, fill #0

## 2022-02-18 MED ORDER — ALBUTEROL SULFATE HFA 108 (90 BASE) MCG/ACT IN AERS
2.0000 | INHALATION_SPRAY | Freq: Four times a day (QID) | RESPIRATORY_TRACT | 0 refills | Status: DC | PRN
  Filled 2022-03-28: qty 18, 25d supply, fill #0

## 2022-02-18 MED ORDER — BUDESONIDE-FORMOTEROL FUMARATE 160-4.5 MCG/ACT IN AERO
2.0000 | INHALATION_SPRAY | Freq: Two times a day (BID) | RESPIRATORY_TRACT | 1 refills | Status: DC
  Filled 2022-02-22: qty 30.6, 90d supply, fill #0
  Filled 2022-02-27: qty 10.2, 30d supply, fill #0

## 2022-02-18 NOTE — ED Notes (Signed)
Rounding on patient No needs at this time. 

## 2022-02-18 NOTE — ED Triage Notes (Signed)
SOB, cough, chest discomfort (dull) that radiates to her left shoulder.   Started: 4 day ago   Home interventions: albuterol inhaler / neb, symbicort

## 2022-02-18 NOTE — ED Notes (Signed)
EKG results given to provider.  

## 2022-02-18 NOTE — ED Provider Notes (Signed)
[facility]    CSN: [id number] Arrival date & time: 02/18/22  1347    HISTORY   Chief Complaint  Patient presents with   Cough   Shortness of Breath   HPI Deanna Schwartz is a 41 y.o. female. Patient presents to urgent care complaining of SOB, cough, chest discomfort (dull) that radiates to her left shoulder, states chest discomfort occurred 4 days ago but is since resolved.  Patient states she has been using her albuterol inhaler and nebulizer and taking her Symbicort regularly.  Patient states her allergies are "awful", not currently taking allergy medications.  The history is provided by the patient.   Past Medical History:  Diagnosis Date   Allergic rhinitis    Anemia    Anxiety    Asthma    B12 deficiency with anti-parietal cell antibodies + 03/25/2011   New 03/2011    Chronic constipation    Constipation    Depression    GERD (gastroesophageal reflux disease)    H. pylori infection 2008   Hx of tx + serology   IBS (irritable bowel syndrome)    Iron deficiency anemia 08/06/2011   Hgb 10.2 and MCV 79 on health screen labs    Irritable bowel syndrome    Knee pain    Obesity    Palpitations    Panic attack    Prediabetes    Shortness of breath    Ulcer    Vitamin D deficiency    Patient Active Problem List   Diagnosis Date Noted   Acute conjunctivitis of both eyes 10/02/2021   Left knee pain 08/14/2021   Neck pain on left side 08/09/2021   Menorrhagia 04/29/2021   Acute bronchitis 12/25/2020   Epistaxis 07/10/2020   Hamstring strain, right, subsequent encounter 07/18/2019   Numbness 06/29/2019   Low back pain with sciatica 06/29/2019   Mid back pain 04/21/2019   Concussion with no loss of consciousness 04/21/2019   Nonallopathic lesion of cervical region 06/14/2018   Sternoclavicular joint pain, right 06/14/2018   Trapezius muscle spasm 05/22/2017   Knee MCL sprain 11/13/2016   Depression 11/10/2016   Class 2 obesity without serious  comorbidity with body mass index (BMI) of 39.0 to 39.9 in adult 10/14/2016   Nonallopathic lesion of sacral region 10/10/2016   Panic attack 08/15/2016   Coccydynia 06/10/2016   Vasovagal syncope 03/14/2016   RUQ pain 03/14/2016   Vitamin D deficiency 11/22/2015   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   Nonallopathic lesion of thoracic region 02/21/2015   Nonallopathic lesion of lumbosacral region 02/21/2015   Nonallopathic lesion-rib cage 02/21/2015   Piriformis syndrome of left side 02/21/2015   Cervicalgia 01/22/2015   Obesity (BMI 35.0-39.9 without comorbidity) 01/22/2015   Muscle tension headache 01/22/2015   Iron deficiency anemia due to chronic blood loss - menses 01/03/2015   Chronic chest wall pain - mostly left side 01/03/2015   Well adult exam 11/13/2014   B12 deficiency 11/13/2014   Acromioclavicular joint separation, type 1 07/12/2014   Trochanteric bursitis of left hip 06/20/2014   Patellofemoral pain syndrome 04/07/2014   Acute medial meniscal tear 01/25/2014   Weight gain 11/18/2013   Anemia, iron deficiency 11/18/2013   Acute sinusitis 11/05/2013   Abdominal pain, left upper quadrant-musculoskeletal 05/13/2013   Iron deficiency anemia 08/06/2011   Arthralgia 03/24/2011   INSOMNIA, CHRONIC 10/11/2010   IBS 04/07/2008   Palpitations 01/11/2008   Generalized anxiety disorder 11/25/2007   Allergic rhinitis 11/25/2007   GERD 11/25/2007  SINUSITIS, CHRONIC 04/03/2007   Asthma 04/03/2007   Past Surgical History:  Procedure Laterality Date   FLEXIBLE SIGMOIDOSCOPY  04/14/2008   normal   NASAL SINUS SURGERY     UPPER GASTROINTESTINAL ENDOSCOPY     OB History     Gravida  4   Para  3   Term  3   Preterm      AB  1   Living  3      SAB  1   IAB      Ectopic      Multiple      Live Births             Home Medications    Prior to Admission medications   Medication Sig Start Date End Date Taking? Authorizing Provider  albuterol (VENTOLIN HFA)  108 (90 Base) MCG/ACT inhaler INHALE 1 - 2 PUFFS INTO THE LUNGS EVERY 6 HOURS AS NEEDED FOR WHEEZING OR SHORTNESS OF BREATH 01/20/22 01/20/23  Binnie Rail, MD  azithromycin (ZITHROMAX) 250 MG tablet Take two tablets the first day and then one tablet daily for four days 01/20/22   Binnie Rail, MD  budesonide-formoterol [facility]) 160-4.5 MCG/ACT inhaler Inhale 2 puffs into the lungs 2 (two) times daily. 06/06/21   Plotnikov, Evie Lacks, MD  cetirizine (ZYRTEC) 10 MG tablet Take 1 tablet (10 mg total) by mouth daily. 11/05/18   Marrian Salvage, FNP  Cholecalciferol (VITAMIN D3) 50 MCG (2000 UT) capsule Take 2 capsules (4,000 Units total) by mouth daily. Patient taking differently: Take 5,000 Units by mouth daily. 07/10/20   Plotnikov, Evie Lacks, MD  clindamycin (CLEOCIN T) 1 % external solution Apply to the affected area 2 times a day at the first sign of bump 12/25/21     Cyanocobalamin (B-12) 1000 MCG CAPS Take by mouth.    [provider]  dicyclomine (BENTYL) 10 MG capsule Take 1 capsule (10 mg total) by mouth 3 (three) times daily as needed for spasms (abdominal pain). 09/27/21   Noralyn Pick, NP  EPINEPHrine (EPIPEN 2-PAK) 0.3 mg/0.3 mL IJ SOAJ injection Inject 1 pen (0.3 mg) into the muscle as needed for anaphylaxis. 03/14/21   Plotnikov, Evie Lacks, MD  Magnesium 250 MG TABS Take 250 mg by mouth.    [provider]  montelukast (SINGULAIR) 10 MG tablet Take 1 tablet (10 mg total) by mouth at bedtime. 12/09/21   Hoyt Koch, MD  predniSONE (DELTASONE) 20 MG tablet Take 2 tablets by mouth daily with breakfast. 01/20/22   Binnie Rail, MD  Prenatal Vit-Fe Fumarate-FA (PRENATAL VITAMINS PO) Take 1 tablet by mouth daily.    [provider]  sertraline (ZOLOFT) 50 MG tablet Take 1/2 tablet (25 mg total) by mouth daily. Patient taking differently: Take 25 mg by mouth as needed. 08/06/21   Laqueta Linden, MD  triamcinolone cream (KENALOG) 0.1 %  Apply up to twice daily to rash on body 2 weeks on, 2 weeks off as needed for flares 12/25/21      Family History Family History  Problem Relation Age of Onset   Hyperlipidemia Mother    Hypertension Mother    Anxiety disorder Mother    Obesity Mother    Cancer Father    Hyperlipidemia Father    Hypertension Father    Diabetes Paternal Grandmother    Diabetes Maternal Aunt    Diabetes Maternal Aunt    Colon polyps Paternal Uncle  x4   Diabetes Other        grandmother   Hypertension Other    Colon cancer Neg Hx    Social History Social History   Tobacco Use   Smoking status: Never   Smokeless tobacco: Never  Vaping Use   Vaping Use: Never used  Substance Use Topics   Alcohol use: Yes    Alcohol/week: 0.0 standard drinks of alcohol    Comment: Social maybe twice amonth, beer   Drug use: No   Allergies   Augmentin [amoxicillin-pot clavulanate], Cephalexin, and Iodine  Review of Systems Review of Systems Pertinent findings noted in history of present illness.   Physical Exam Triage Vital Signs ED Triage Vitals  Enc Vitals Group     BP 07/05/21 0827 (!) 147/82     Pulse Rate 07/05/21 0827 72     Resp 07/05/21 0827 18     Temp 07/05/21 0827 98.3 F (36.8 C)     Temp Source 07/05/21 0827 Oral     SpO2 07/05/21 0827 98 %     Weight --      Height --      Head Circumference --      Peak Flow --      Pain Score 07/05/21 0826 5     Pain Loc --      Pain Edu? --      Excl. in [place]? --   No data found.  Updated Vital Signs BP 135/82 (BP Location: Left Arm)   Pulse (!) 106   Temp 99 F (37.2 C) (Oral)   Resp 16   LMP 01/27/2022 (Approximate)   SpO2 97%   Physical Exam Vitals and nursing note reviewed.  Constitutional:      General: She is not in acute distress.    Appearance: Normal appearance. She is not ill-appearing.  HENT:     Head: Normocephalic and atraumatic.     Salivary Glands: Right salivary gland is not diffusely enlarged or tender.  Left salivary gland is not diffusely enlarged or tender.     Right Ear: Hearing, ear canal and external ear normal. No drainage. No middle ear effusion. There is no impacted cerumen. Tympanic membrane is bulging (Clear fluid). Tympanic membrane is not injected or erythematous.     Left Ear: Hearing, ear canal and external ear normal. No drainage.  No middle ear effusion. There is no impacted cerumen. Tympanic membrane is bulging (Suppurative fluid). Tympanic membrane is not injected or erythematous.     Ears:     Comments: Bilateral EACs normal, both TMs bulging with clear fluid    Nose: Rhinorrhea present. No nasal deformity, septal deviation, signs of injury, nasal tenderness, mucosal edema or congestion. Rhinorrhea is clear.     Right Nostril: Occlusion present. No foreign body, epistaxis or septal hematoma.     Left Nostril: Occlusion present. No foreign body, epistaxis or septal hematoma.     Right Turbinates: Enlarged, swollen and pale.     Left Turbinates: Enlarged, swollen and pale.     Right Sinus: No maxillary sinus tenderness or frontal sinus tenderness.     Left Sinus: No maxillary sinus tenderness or frontal sinus tenderness.     Mouth/Throat:     Lips: Pink. No lesions.     Mouth: Mucous membranes are moist. No oral lesions.     Pharynx: Oropharynx is clear. Uvula midline. No posterior oropharyngeal erythema or uvula swelling.     Tonsils: No tonsillar exudate. 0 on  the right. 0 on the left.     Comments: Postnasal drip Eyes:     General: Lids are normal.        Right eye: No discharge.        Left eye: No discharge.     Extraocular Movements: Extraocular movements intact.     Conjunctiva/sclera: Conjunctivae normal.     Right eye: Right conjunctiva is not injected.     Left eye: Left conjunctiva is not injected.  Neck:     Trachea: Trachea and phonation normal.  Cardiovascular:     Rate and Rhythm: Normal rate and regular rhythm.     Pulses: Normal pulses.     Heart  sounds: Normal heart sounds. No murmur heard.    No friction rub. No gallop.  Pulmonary:     Effort: Pulmonary effort is normal. No accessory muscle usage, prolonged expiration or respiratory distress.     Breath sounds: No stridor, decreased air movement or transmitted upper airway sounds. Examination of the right-upper field reveals wheezing. Examination of the left-upper field reveals wheezing. Wheezing present. No decreased breath sounds, rhonchi or rales.  Chest:     Chest wall: No tenderness.  Musculoskeletal:        General: Normal range of motion.     Cervical back: Normal range of motion and neck supple. Normal range of motion.  Lymphadenopathy:     Cervical: No cervical adenopathy.  Skin:    General: Skin is warm and dry.     Findings: No erythema or rash.  Neurological:     General: No focal deficit present.     Mental Status: She is alert and oriented to person, place, and time.  Psychiatric:        Mood and Affect: Mood normal.        Behavior: Behavior normal.     Visual Acuity Right Eye Distance:   Left Eye Distance:   Bilateral Distance:    Right Eye Near:   Left Eye Near:    Bilateral Near:     UC Couse / Diagnostics / Procedures:    EKG  Radiology No results found.  Procedures Procedures (including critical care time)  UC Diagnoses / Final Clinical Impressions(s)   I have reviewed the triage vital signs and the nursing notes.  Pertinent labs & imaging results that were available during my care of the patient were reviewed by me and considered in my medical decision making (see chart for details).   Final diagnoses:  Moderate persistent asthma with (acute) exacerbation  Perennial allergic rhinitis  Chest pain, unspecified type   Patient provided with a Decadron injection for rapid relief of her symptoms.  Allergy medications renewed, inhalers renewed.  Patient provided with a methylprednisolone Dosepak should she find that the Decadron does not  completely resolve her wheezing and chest discomfort.  Return precautions advised.  ED Prescriptions     Medication Sig Dispense Auth. Provider   cetirizine (ZYRTEC ALLERGY) 10 MG tablet Take 1 tablet (10 mg total) by mouth at bedtime. 30 tablet Lynden Oxford Scales, PA-C   montelukast (SINGULAIR) 10 MG tablet Take 1 tablet (10 mg total) by mouth at bedtime. 30 tablet Lynden Oxford Scales, PA-C   fluticasone (FLONASE) 50 MCG/ACT nasal spray Place 1 spray into both nostrils daily. 48 mL Lynden Oxford Scales, PA-C   albuterol (VENTOLIN HFA) 108 (90 Base) MCG/ACT inhaler Inhale 2 puffs into the lungs every 6 (six) hours as needed for wheezing or shortness of  breath (Cough). 18 g Lynden Oxford Scales, PA-C   budesonide-formoterol [facility]) 160-4.5 MCG/ACT inhaler Inhale 2 puffs into the lungs 2 (two) times daily. 3 each Lynden Oxford Scales, PA-C   methylPREDNISolone (MEDROL DOSEPAK) 4 MG TBPK tablet Take 24 mg on day 1, 20 mg on day 2, 16 mg on day 3, 12 mg on day 4, 8 mg on day 5, 4 mg on day 6.  Take all tablets in each row at once, do not spread tablets out throughout the day. 21 tablet Lynden Oxford Scales, PA-C      PDMP not reviewed this encounter.  Pending results:  Labs Reviewed - No data to display  Medications Ordered in UC: Medications - No data to display  Disposition Upon Discharge:  Condition: stable for discharge home Home: take medications as prescribed; routine discharge instructions as discussed; follow up as advised.  Patient presented with an acute illness with associated systemic symptoms and significant discomfort requiring urgent management. In my opinion, this is a condition that a prudent lay person (someone who possesses an average knowledge of health and medicine) may potentially expect to result in complications if not addressed urgently such as respiratory distress, impairment of bodily function or dysfunction of bodily organs.   Routine symptom  specific, illness specific and/or disease specific instructions were discussed with the patient and/or caregiver at length.   As such, the patient has been evaluated and assessed, work-up was performed and treatment was provided in alignment with urgent care protocols and evidence based medicine.  Patient/parent/caregiver has been advised that the patient may require follow up for further testing and treatment if the symptoms continue in spite of treatment, as clinically indicated and appropriate.  If the patient was tested for COVID-19, Influenza and/or RSV, then the patient/parent/guardian was advised to isolate at home pending the results of his/her diagnostic coronavirus test and potentially longer if they're positive. I have also advised pt that if his/her COVID-19 test returns positive, it's recommended to self-isolate for at least 10 days after symptoms first appeared AND until fever-free for 24 hours without fever reducer AND other symptoms have improved or resolved. Discussed self-isolation recommendations as well as instructions for household member/close contacts as per the [facility] and [place] DHHS, and also gave patient the [place] packet with this information.  Patient/parent/caregiver has been advised to return to the [facility] or PCP in 3-5 days if no better; to PCP or the Emergency Department if new signs and symptoms develop, or if the current signs or symptoms continue to change or worsen for further workup, evaluation and treatment as clinically indicated and appropriate  The patient will follow up with their current PCP if and as advised. If the patient does not currently have a PCP we will assist them in obtaining one.   The patient may need specialty follow up if the symptoms continue, in spite of conservative treatment and management, for further workup, evaluation, consultation and treatment as clinically indicated and appropriate.  Patient/parent/caregiver verbalized understanding and agreement  of plan as discussed.  All questions were addressed during visit.  Please see discharge instructions below for further details of plan.  Discharge Instructions:   Discharge Instructions      Your EKG today is normal.  You received an injection of Decadron during your visit today to help resolve your wheezing.  I have sent a prescription for methylprednisolone to your pharmacy that you can choose to take if you feel like the shot did not completely resolved your  shortness of breath.  I renewed your prescription for Symbicort, cetirizine, albuterol, Singulair.    Please let us know if you have not had complete resolution of your symptoms in the next 3 to 5 days.  Thank you for visiting urgent care today.      This office note has been dictated using Museum/gallery curator.  Unfortunately, and despite my best efforts, this method of dictation can sometimes lead to occasional typographical or grammatical errors.  I apologize in advance if this occurs.     Lynden Oxford Scales, PA-C 02/18/22 1601

## 2022-02-18 NOTE — Telephone Encounter (Signed)
Patient verification complete (name and date of birth).   Patient made aware that prescribed antibiotic azithromycin was sent to the pharmacy.   The patient states she is starting to get bumps on her arms- she is directed to come to the urgent care so that nursing staff could monitor her for a while (per provider). The patient states she is not near the urgent care. The patient is made aware that if she begins to get more rashes, have swelling to her face/ throat/ lips, scratchiness to her throat, hives, SOB, etc. Call 911/ go to the emergency room. Patient confirms understanding.

## 2022-02-18 NOTE — Discharge Instructions (Addendum)
Your EKG today is normal.  You received an injection of Decadron during your visit today to help resolve your wheezing.  I have sent a prescription for methylprednisolone to your pharmacy that you can choose to take if you feel like the shot did not completely resolved your shortness of breath.  I renewed your prescription for Symbicort, cetirizine, albuterol, Singulair.    Please let us know if you have not had complete resolution of your symptoms in the next 3 to 5 days.  Thank you for visiting urgent care today.

## 2022-02-19 ENCOUNTER — Other Ambulatory Visit (HOSPITAL_COMMUNITY): Payer: Self-pay

## 2022-02-20 ENCOUNTER — Other Ambulatory Visit (HOSPITAL_COMMUNITY): Payer: Self-pay

## 2022-02-21 ENCOUNTER — Other Ambulatory Visit: Payer: Self-pay

## 2022-02-21 ENCOUNTER — Other Ambulatory Visit (HOSPITAL_COMMUNITY): Payer: Self-pay

## 2022-02-21 ENCOUNTER — Inpatient Hospital Stay: Payer: No Typology Code available for payment source

## 2022-02-21 VITALS — BP 116/51 | HR 67 | Temp 98.4°F | Resp 18

## 2022-02-21 DIAGNOSIS — D500 Iron deficiency anemia secondary to blood loss (chronic): Secondary | ICD-10-CM | POA: Diagnosis not present

## 2022-02-21 MED ORDER — ALBUTEROL SULFATE HFA 108 (90 BASE) MCG/ACT IN AERS
INHALATION_SPRAY | RESPIRATORY_TRACT | 0 refills | Status: DC
  Filled 2022-07-01: qty 6.7, 25d supply, fill #0

## 2022-02-21 MED ORDER — SODIUM CHLORIDE 0.9 % IV SOLN
200.0000 mg | Freq: Once | INTRAVENOUS | Status: AC
  Administered 2022-02-21: 200 mg via INTRAVENOUS
  Filled 2022-02-21: qty 200

## 2022-02-21 MED ORDER — SODIUM CHLORIDE 0.9 % IV SOLN: Freq: Once | INTRAVENOUS | Status: AC

## 2022-02-21 NOTE — Patient Instructions (Signed)

## 2022-02-22 ENCOUNTER — Other Ambulatory Visit (HOSPITAL_COMMUNITY): Payer: Self-pay

## 2022-02-27 ENCOUNTER — Other Ambulatory Visit (HOSPITAL_COMMUNITY): Payer: Self-pay

## 2022-02-28 ENCOUNTER — Other Ambulatory Visit: Payer: Self-pay | Admitting: Internal Medicine

## 2022-02-28 ENCOUNTER — Other Ambulatory Visit (HOSPITAL_COMMUNITY): Payer: Self-pay

## 2022-02-28 ENCOUNTER — Inpatient Hospital Stay: Payer: No Typology Code available for payment source

## 2022-02-28 ENCOUNTER — Other Ambulatory Visit: Payer: Self-pay

## 2022-02-28 VITALS — BP 129/86 | HR 87 | Temp 98.2°F | Resp 18

## 2022-02-28 DIAGNOSIS — D500 Iron deficiency anemia secondary to blood loss (chronic): Secondary | ICD-10-CM | POA: Diagnosis not present

## 2022-02-28 MED ORDER — SODIUM CHLORIDE 0.9 % IV SOLN: Freq: Once | INTRAVENOUS | Status: AC

## 2022-02-28 MED ORDER — SODIUM CHLORIDE 0.9 % IV SOLN
200.0000 mg | Freq: Once | INTRAVENOUS | Status: AC
  Administered 2022-02-28: 200 mg via INTRAVENOUS
  Filled 2022-02-28: qty 200

## 2022-02-28 NOTE — Progress Notes (Signed)
Pt observed for 30 minutes post Venofer infusion. Pt tolerated trtmt well w/out incident. VSS at discharge.  Ambulatory to lobby.   

## 2022-03-03 ENCOUNTER — Other Ambulatory Visit (HOSPITAL_COMMUNITY): Payer: Self-pay

## 2022-03-03 ENCOUNTER — Telehealth: Payer: Self-pay | Admitting: Family Medicine

## 2022-03-03 MED ORDER — AZITHROMYCIN 250 MG PO TABS
ORAL_TABLET | ORAL | 0 refills | Status: DC
  Filled 2022-03-03: qty 6, 5d supply, fill #0

## 2022-03-06 ENCOUNTER — Other Ambulatory Visit: Payer: Self-pay | Admitting: Obstetrics and Gynecology

## 2022-03-06 DIAGNOSIS — Z1231 Encounter for screening mammogram for malignant neoplasm of breast: Secondary | ICD-10-CM

## 2022-03-17 ENCOUNTER — Ambulatory Visit: Payer: No Typology Code available for payment source | Admitting: Internal Medicine

## 2022-03-19 ENCOUNTER — Ambulatory Visit
Admission: RE | Admit: 2022-03-19 | Discharge: 2022-03-19 | Disposition: A | Discharge status: Home / Self Care | Payer: No Typology Code available for payment source | Source: Ambulatory Visit | Attending: Obstetrics and Gynecology | Admitting: Obstetrics and Gynecology

## 2022-03-19 DIAGNOSIS — Z1231 Encounter for screening mammogram for malignant neoplasm of breast: Secondary | ICD-10-CM

## 2022-03-28 ENCOUNTER — Other Ambulatory Visit (HOSPITAL_COMMUNITY): Payer: Self-pay

## 2022-04-16 ENCOUNTER — Encounter (INDEPENDENT_AMBULATORY_CARE_PROVIDER_SITE_OTHER): Payer: Self-pay

## 2022-04-21 ENCOUNTER — Encounter: Payer: Self-pay | Admitting: Internal Medicine

## 2022-04-21 ENCOUNTER — Ambulatory Visit (INDEPENDENT_AMBULATORY_CARE_PROVIDER_SITE_OTHER): Payer: No Typology Code available for payment source | Admitting: Internal Medicine

## 2022-04-21 ENCOUNTER — Other Ambulatory Visit (HOSPITAL_COMMUNITY): Payer: Self-pay

## 2022-04-21 VITALS — BP 122/70 | HR 95 | Temp 98.4°F | Ht 63.0 in | Wt 249.0 lb

## 2022-04-21 DIAGNOSIS — E669 Obesity, unspecified: Secondary | ICD-10-CM

## 2022-04-21 DIAGNOSIS — E538 Deficiency of other specified B group vitamins: Secondary | ICD-10-CM | POA: Diagnosis not present

## 2022-04-21 DIAGNOSIS — E079 Disorder of thyroid, unspecified: Secondary | ICD-10-CM | POA: Insufficient documentation

## 2022-04-21 DIAGNOSIS — Z0000 Encounter for general adult medical examination without abnormal findings: Secondary | ICD-10-CM

## 2022-04-21 DIAGNOSIS — J4531 Mild persistent asthma with (acute) exacerbation: Secondary | ICD-10-CM

## 2022-04-21 DIAGNOSIS — E559 Vitamin D deficiency, unspecified: Secondary | ICD-10-CM

## 2022-04-21 DIAGNOSIS — N898 Other specified noninflammatory disorders of vagina: Secondary | ICD-10-CM | POA: Insufficient documentation

## 2022-04-21 LAB — COMPREHENSIVE METABOLIC PANEL
ALT: 15 U/L (ref 0–35)
AST: 18 U/L (ref 0–37)
Albumin: 4.1 g/dL (ref 3.5–5.2)
Alkaline Phosphatase: 60 U/L (ref 39–117)
BUN: 13 mg/dL (ref 6–23)
CO2: 27 mEq/L (ref 19–32)
Calcium: 9.4 mg/dL (ref 8.4–10.5)
Chloride: 103 mEq/L (ref 96–112)
Creatinine, Ser: 0.77 mg/dL (ref 0.40–1.20)
GFR: 96.65 mL/min (ref >60.00)
Glucose, Bld: 84 mg/dL (ref 70–99)
Potassium: 4.1 mEq/L (ref 3.5–5.1)
Sodium: 138 mEq/L (ref 135–145)
Total Bilirubin: 0.3 mg/dL (ref 0.2–1.2)
Total Protein: 7.3 g/dL (ref 6.0–8.3)

## 2022-04-21 LAB — URINALYSIS
Bilirubin Urine: NEGATIVE
Ketones, ur: NEGATIVE
Leukocytes,Ua: NEGATIVE
Nitrite: NEGATIVE
Specific Gravity, Urine: >=1.03 — AB (ref 1.000–1.030)
Total Protein, Urine: NEGATIVE
Urine Glucose: NEGATIVE
Urobilinogen, UA: 0.2 (ref 0.0–1.0)
pH: 6 (ref 5.0–8.0)

## 2022-04-21 LAB — CBC WITH DIFFERENTIAL/PLATELET
Basophils Absolute: 0 10*3/uL (ref 0.0–0.1)
Basophils Relative: 0.6 % (ref 0.0–3.0)
Eosinophils Absolute: 0.7 10*3/uL (ref 0.0–0.7)
Eosinophils Relative: 8.7 % — ABNORMAL HIGH (ref 0.0–5.0)
HCT: 38.9 % (ref 36.0–46.0)
Hemoglobin: 12.7 g/dL (ref 12.0–15.0)
Lymphocytes Relative: 25.9 % (ref 12.0–46.0)
Lymphs Abs: 2 10*3/uL (ref 0.7–4.0)
MCHC: 32.7 g/dL (ref 30.0–36.0)
MCV: 86.7 fl (ref 78.0–100.0)
Monocytes Absolute: 0.6 10*3/uL (ref 0.1–1.0)
Monocytes Relative: 8.3 % (ref 3.0–12.0)
Neutro Abs: 4.3 10*3/uL (ref 1.4–7.7)
Neutrophils Relative %: 56.5 % (ref 43.0–77.0)
Platelets: 217 10*3/uL (ref 150.0–400.0)
RBC: 4.49 Mil/uL (ref 3.87–5.11)
RDW: 18 % — ABNORMAL HIGH (ref 11.5–15.5)
WBC: 7.7 10*3/uL (ref 4.0–10.5)

## 2022-04-21 LAB — VITAMIN D 25 HYDROXY (VIT D DEFICIENCY, FRACTURES): VITD: 27.03 ng/mL — ABNORMAL LOW (ref 30.00–100.00)

## 2022-04-21 LAB — VITAMIN B12: Vitamin B-12: 171 pg/mL — ABNORMAL LOW (ref 211–911)

## 2022-04-21 LAB — LIPID PANEL
Cholesterol: 190 mg/dL (ref 0–200)
HDL: 50.5 mg/dL (ref >39.00)
NonHDL: 139.16
Total CHOL/HDL Ratio: 4
Triglycerides: 332 mg/dL — ABNORMAL HIGH (ref 0.0–149.0)
VLDL: 66.4 mg/dL — ABNORMAL HIGH (ref 0.0–40.0)

## 2022-04-21 LAB — TSH: TSH: 1.41 u[IU]/mL (ref 0.35–5.50)

## 2022-04-21 LAB — LDL CHOLESTEROL, DIRECT: Direct LDL: 105 mg/dL

## 2022-04-21 MED ORDER — ALBUTEROL SULFATE HFA 108 (90 BASE) MCG/ACT IN AERS
INHALATION_SPRAY | RESPIRATORY_TRACT | 2 refills | Status: DC
  Filled 2022-04-21: qty 6.7, 25d supply, fill #0
  Filled 2022-05-16: qty 6.7, 25d supply, fill #1
  Filled 2022-07-24: qty 6.7, 25d supply, fill #2

## 2022-04-21 MED ORDER — WEGOVY 0.25 MG/0.5ML ~~LOC~~ SOAJ
0.2500 mg | SUBCUTANEOUS | 2 refills | Status: DC
  Filled 2022-04-21 – 2022-11-03 (×4): qty 2, 28d supply, fill #0
  Filled 2022-12-04 – 2022-12-08 (×2): qty 2, 28d supply, fill #1

## 2022-04-21 MED ORDER — METHYLPREDNISOLONE 4 MG PO TBPK
ORAL_TABLET | ORAL | 0 refills | Status: DC
  Filled 2022-04-21: qty 21, 6d supply, fill #0

## 2022-04-21 MED ORDER — EPINEPHRINE 0.3 MG/0.3ML IJ SOAJ
0.3000 mg | INTRAMUSCULAR | 3 refills | Status: DC | PRN
Start: 2022-04-21 — End: 2023-04-23
  Filled 2022-04-21: qty 2, 30d supply, fill #0

## 2022-04-21 NOTE — Progress Notes (Signed)
Subjective:  Patient ID: Deanna Schwartz, female    DOB: Dec 17, 1980  Age: 41 y.o. MRN: [id number]  CC: No chief complaint on file.   HPI Deanna Schwartz presents for a well exam  Outpatient Medications Prior to Visit  Medication Sig Dispense Refill   albuterol (VENTOLIN HFA) 108 (90 Base) MCG/ACT inhaler Inhale 1 to 2 puffs into the lungs every 6 hours as needed for wheezing or shortness of breath. 18 g 0   budesonide-formoterol (SYMBICORT) 160-4.5 MCG/ACT inhaler Inhale 2 puffs into the lungs 2 (two) times daily. 30.6 g 1   cetirizine (ZYRTEC ALLERGY) 10 MG tablet Take 1 tablet (10 mg total) by mouth at bedtime. 30 tablet 2   Cholecalciferol (VITAMIN D3) 50 MCG (2000 UT) capsule Take 2 capsules (4,000 Units total) by mouth daily. (Patient taking differently: Take 5,000 Units by mouth daily.) 200 capsule 3   Cyanocobalamin (B-12) 1000 MCG CAPS Take by mouth.     dicyclomine (BENTYL) 10 MG capsule Take 1 capsule (10 mg total) by mouth 3 (three) times daily as needed for spasms (abdominal pain). 30 capsule 1   fluticasone (FLONASE) 50 MCG/ACT nasal spray Place 1 spray into both nostrils daily. 48 g 1   Magnesium 250 MG TABS Take 250 mg by mouth.     montelukast (SINGULAIR) 10 MG tablet Take 1 tablet (10 mg total) by mouth at bedtime. 90 tablet 3   Prenatal Vit-Fe Fumarate-FA (PRENATAL VITAMINS PO) Take 1 tablet by mouth daily.     sertraline (ZOLOFT) 50 MG tablet      triamcinolone cream (KENALOG) 0.1 % Apply up to twice daily to rash on body 2 weeks on, 2 weeks off as needed for flares 454 g 2   albuterol (VENTOLIN HFA) 108 (90 Base) MCG/ACT inhaler INHALE 1 - 2 PUFFS INTO THE LUNGS EVERY 6 HOURS AS NEEDED FOR WHEEZING OR SHORTNESS OF BREATH 18 g 2   albuterol (VENTOLIN HFA) 108 (90 Base) MCG/ACT inhaler Inhale 2 puffs into the lungs every 6 (six) hours as needed for wheezing or shortness of breath (Cough). 18 g 0   azithromycin (ZITHROMAX) 250 MG tablet Take first 2 tablets today,  then take 1 tablet every day until finished. 6 tablet 0   EPINEPHrine (EPIPEN 2-PAK) 0.3 mg/0.3 mL IJ SOAJ injection Inject 1 pen (0.3 mg) into the muscle as needed for anaphylaxis. 2 each 3   methylPREDNISolone (MEDROL DOSEPAK) 4 MG TBPK tablet Take 24 mg on day 1, 20 mg on day 2, 16 mg on day 3, 12 mg on day 4, 8 mg on day 5, 4 mg on day 6.  Take all tablets in each row at once, do not spread tablets out throughout the day. 21 tablet 0   montelukast (SINGULAIR) 10 MG tablet Take 1 tablet (10 mg total) by mouth at bedtime. 30 tablet 2   No facility-administered medications prior to visit.    ROS: Review of Systems  Constitutional:  Positive for unexpected weight change. Negative for activity change, appetite change, chills and fatigue.  HENT:  Positive for congestion. Negative for mouth sores and sinus pressure.   Eyes:  Negative for visual disturbance.  Respiratory:  Positive for cough, chest tightness and wheezing.   Gastrointestinal:  Negative for abdominal pain and nausea.  Genitourinary:  Negative for difficulty urinating, frequency and vaginal pain.  Musculoskeletal:  Negative for back pain and gait problem.  Skin:  Negative for pallor and rash.  Neurological:  Negative for  dizziness, tremors, weakness, numbness and headaches.  Psychiatric/Behavioral:  Negative for confusion, sleep disturbance and suicidal ideas.     Objective:  BP 122/70 (BP Location: Left Arm, Patient Position: Sitting, Cuff Size: Large)   Pulse 95   Temp 98.4 F (36.9 C) (Oral)   Ht '5\' 3"'$  (1.6 m)   Wt 249 lb (112.9 kg)   LMP 03/21/2022   SpO2 98%   BMI 44.11 kg/m   BP Readings from Last 3 Encounters:  04/21/22 122/70  02/28/22 129/86  02/21/22 (!) 116/51    Wt Readings from Last 3 Encounters:  04/21/22 249 lb (112.9 kg)  01/22/22 248 lb 3.2 oz (112.6 kg)  01/20/22 248 lb (112.5 kg)    Physical Exam Constitutional:      General: She is not in acute distress.    Appearance: She is  well-developed. She is obese.  HENT:     Head: Normocephalic.     Right Ear: External ear normal.     Left Ear: External ear normal.     Nose: Nose normal.  Eyes:     General:        Right eye: No discharge.        Left eye: No discharge.     Conjunctiva/sclera: Conjunctivae normal.     Pupils: Pupils are equal, round, and reactive to light.  Neck:     Thyroid: No thyromegaly.     Vascular: No JVD.     Trachea: No tracheal deviation.  Cardiovascular:     Rate and Rhythm: Normal rate and regular rhythm.     Heart sounds: Normal heart sounds.  Pulmonary:     Effort: No respiratory distress.     Breath sounds: No stridor. No wheezing or rhonchi.  Abdominal:     General: Bowel sounds are normal. There is no distension.     Palpations: Abdomen is soft. There is no mass.     Tenderness: There is no abdominal tenderness. There is no guarding or rebound.  Musculoskeletal:        General: No tenderness.     Cervical back: Normal range of motion and neck supple. No rigidity.  Lymphadenopathy:     Cervical: No cervical adenopathy.  Skin:    Findings: No erythema or rash.  Neurological:     Cranial Nerves: No cranial nerve deficit.     Motor: No abnormal muscle tone.     Coordination: Coordination normal.     Deep Tendon Reflexes: Reflexes normal.  Psychiatric:        Behavior: Behavior normal.        Thought Content: Thought content normal.        Judgment: Judgment normal.     Lab Results  Component Value Date   WBC 8.3 01/08/2022   HGB 10.9 (L) 01/08/2022   HCT 34.1 (L) 01/08/2022   PLT 326.0 01/08/2022   GLUCOSE 93 09/27/2021   CHOL 219 (H) 07/23/2021   TRIG 194 (H) 07/23/2021   HDL 53 07/23/2021   LDLDIRECT 86.0 10/13/2017   LDLCALC 132 (H) 07/23/2021   ALT 12 09/27/2021   AST 15 09/27/2021   NA 134 (L) 09/27/2021   K 3.8 09/27/2021   CL 101 09/27/2021   CREATININE 0.83 09/27/2021   BUN 10 09/27/2021   CO2 28 09/27/2021   TSH 1.520 07/23/2021   INR 1.0  07/09/2020   HGBA1C 5.6 07/23/2021    MM 3D SCREEN BREAST BILATERAL  Result Date: 03/20/2022 CLINICAL DATA:  Screening.  EXAM: DIGITAL SCREENING BILATERAL MAMMOGRAM WITH TOMOSYNTHESIS AND CAD TECHNIQUE: Bilateral screening digital craniocaudal and mediolateral oblique mammograms were obtained. Bilateral screening digital breast tomosynthesis was performed. The images were evaluated with computer-aided detection. COMPARISON:  Previous exam(s). ACR Breast Density Category b: There are scattered areas of fibroglandular density. FINDINGS: There are no findings suspicious for malignancy. IMPRESSION: No mammographic evidence of malignancy. A result letter of this screening mammogram will be mailed directly to the patient. RECOMMENDATION: Screening mammogram in one year. (Code:SM-B-01Y) BI-RADS CATEGORY  1: Negative. Electronically Signed   By: Lovey Newcomer M.D.   On: 03/20/2022 13:06    Assessment & Plan:   Problem List Items Addressed This Visit     Asthma    Worse Medrol pack Re-start Zyrtec Refused Prevnar, RSV      Relevant Medications   albuterol (VENTOLIN HFA) 108 (90 Base) MCG/ACT inhaler   methylPREDNISolone (MEDROL DOSEPAK) 4 MG TBPK tablet   B12 deficiency   Relevant Orders   Vitamin B12   Obesity (BMI 35.0-39.9 without comorbidity)    Discussed Start Wegovy      Relevant Medications   Semaglutide-Weight Management (WEGOVY) 0.25 MG/0.5ML SOAJ   Vitamin D deficiency   Relevant Orders   VITAMIN D 25 Hydroxy (Vit-D Deficiency, Fractures)   Well adult exam - Primary     We discussed age appropriate health related issues, including available/recomended screening tests and vaccinations. Labs were ordered to be later reviewed . All questions were answered. We discussed one or more of the following - seat belt use, use of sunscreen/sun exposure exercise, fall risk reduction, second hand smoke exposure, firearm use and storage, seat belt use, a need for adhering to healthy diet and  exercise. Labs were ordered.  All questions were answered.        Relevant Orders   TSH   Urinalysis   CBC with Differential/Platelet   Lipid panel   Comprehensive metabolic panel      Meds ordered this encounter  Medications   albuterol (VENTOLIN HFA) 108 (90 Base) MCG/ACT inhaler    Sig: INHALE 1 - 2 PUFFS INTO THE LUNGS EVERY 6 HOURS AS NEEDED FOR WHEEZING OR SHORTNESS OF BREATH    Dispense:  18 g    Refill:  2   EPINEPHrine (EPIPEN 2-PAK) 0.3 mg/0.3 mL IJ SOAJ injection    Sig: Inject 1 pen (0.3 mg) into the muscle as needed for anaphylaxis.    Dispense:  2 each    Refill:  3   Semaglutide-Weight Management (WEGOVY) 0.25 MG/0.5ML SOAJ    Sig: Inject 0.25 mg into the skin once a week.    Dispense:  2 mL    Refill:  2    BMI 44   methylPREDNISolone (MEDROL DOSEPAK) 4 MG TBPK tablet    Sig: As directed    Dispense:  21 tablet    Refill:  0      Follow-up: Return in about 3 months (around 07/22/2022) for a follow-up visit.  [name], MD

## 2022-04-21 NOTE — Assessment & Plan Note (Addendum)
Discussed Start [facility]

## 2022-04-21 NOTE — Assessment & Plan Note (Signed)

## 2022-04-21 NOTE — Assessment & Plan Note (Addendum)
Worse Medrol pack Re-start Zyrtec Refused Prevnar, RSV

## 2022-04-22 ENCOUNTER — Other Ambulatory Visit (HOSPITAL_COMMUNITY): Payer: Self-pay

## 2022-04-22 ENCOUNTER — Other Ambulatory Visit: Payer: Self-pay | Admitting: Internal Medicine

## 2022-04-22 MED ORDER — VITAMIN B-12 1000 MCG SL SUBL
1.0000 | SUBLINGUAL_TABLET | Freq: Every day | SUBLINGUAL | 3 refills | Status: DC
  Filled 2022-04-22: qty 100, 100d supply, fill #0

## 2022-04-22 MED ORDER — VITAMIN D (ERGOCALCIFEROL) 1.25 MG (50000 UNIT) PO CAPS
50000.0000 [IU] | ORAL_CAPSULE | ORAL | 0 refills | Status: DC
Start: 2022-04-22 — End: 2022-09-16
  Filled 2022-04-22: qty 6, 42d supply, fill #0

## 2022-04-23 ENCOUNTER — Other Ambulatory Visit (HOSPITAL_COMMUNITY): Payer: Self-pay

## 2022-04-24 ENCOUNTER — Encounter: Payer: Self-pay | Admitting: Internal Medicine

## 2022-04-24 ENCOUNTER — Other Ambulatory Visit (HOSPITAL_COMMUNITY): Payer: Self-pay

## 2022-04-24 NOTE — Telephone Encounter (Addendum)
Need PA on wegovy.Submitted w/(Key: [id number]). Rec'd msg PA sent to [employer].Marland KitchenJohny Schwartz

## 2022-04-25 ENCOUNTER — Other Ambulatory Visit (HOSPITAL_COMMUNITY): Payer: Self-pay

## 2022-04-30 ENCOUNTER — Other Ambulatory Visit (HOSPITAL_COMMUNITY): Payer: Self-pay

## 2022-04-30 NOTE — Telephone Encounter (Signed)
Finally received response med was DENED. It states " This request has not been approved. Based on the information submitted for review, you did not meet our guideline rules for the requested drug. When used for weight loss or weight management, our guideline named ANTI-OBESITY  AGENTS (reviewed for [facility]) requires that you are actively enrolled in an exercise and caloric reduction program or a weight loss/behavioral modification program.../lmb

## 2022-05-05 ENCOUNTER — Other Ambulatory Visit (HOSPITAL_COMMUNITY): Payer: Self-pay

## 2022-05-08 NOTE — Telephone Encounter (Signed)
Tried to another PA for [employer] rec'd msg " Message from [employer] previously denied or partially denied PA request has been located for this member. To initiate an appeal or reconsideration please call [phone]. Thank you." Call # give spoke with rep took info all the information. Can fax last ov [phone].Marland KitchenJohny Chess

## 2022-05-13 ENCOUNTER — Other Ambulatory Visit (HOSPITAL_COMMUNITY): Payer: Self-pay

## 2022-05-14 ENCOUNTER — Other Ambulatory Visit (HOSPITAL_COMMUNITY): Payer: Self-pay

## 2022-05-14 ENCOUNTER — Other Ambulatory Visit: Payer: Self-pay | Admitting: Internal Medicine

## 2022-05-14 MED ORDER — SERTRALINE HCL 50 MG PO TABS
50.0000 mg | ORAL_TABLET | Freq: Every day | ORAL | 3 refills | Status: DC
  Filled 2022-05-14: qty 90, 90d supply, fill #0
  Filled 2022-12-04 – 2022-12-08 (×2): qty 90, 90d supply, fill #1

## 2022-05-16 ENCOUNTER — Other Ambulatory Visit (HOSPITAL_COMMUNITY): Payer: Self-pay

## 2022-05-17 ENCOUNTER — Other Ambulatory Visit: Payer: Self-pay | Admitting: Internal Medicine

## 2022-05-24 ENCOUNTER — Other Ambulatory Visit (HOSPITAL_COMMUNITY): Payer: Self-pay

## 2022-05-27 ENCOUNTER — Encounter: Payer: Self-pay | Admitting: Oncology

## 2022-05-27 ENCOUNTER — Other Ambulatory Visit (HOSPITAL_COMMUNITY): Payer: Self-pay

## 2022-05-28 ENCOUNTER — Other Ambulatory Visit (HOSPITAL_COMMUNITY): Payer: Self-pay

## 2022-06-04 ENCOUNTER — Telehealth: Payer: Self-pay

## 2022-06-04 ENCOUNTER — Encounter: Payer: Self-pay | Admitting: Internal Medicine

## 2022-06-04 ENCOUNTER — Ambulatory Visit (INDEPENDENT_AMBULATORY_CARE_PROVIDER_SITE_OTHER): Payer: No Typology Code available for payment source

## 2022-06-04 ENCOUNTER — Ambulatory Visit (INDEPENDENT_AMBULATORY_CARE_PROVIDER_SITE_OTHER): Payer: No Typology Code available for payment source | Admitting: Internal Medicine

## 2022-06-04 ENCOUNTER — Other Ambulatory Visit (HOSPITAL_COMMUNITY): Payer: Self-pay

## 2022-06-04 VITALS — BP 122/78 | HR 94 | Temp 98.3°F | Wt 248.0 lb

## 2022-06-04 DIAGNOSIS — R062 Wheezing: Secondary | ICD-10-CM

## 2022-06-04 DIAGNOSIS — Z23 Encounter for immunization: Secondary | ICD-10-CM | POA: Diagnosis not present

## 2022-06-04 DIAGNOSIS — R051 Acute cough: Secondary | ICD-10-CM

## 2022-06-04 DIAGNOSIS — H01001 Unspecified blepharitis right upper eyelid: Secondary | ICD-10-CM | POA: Diagnosis not present

## 2022-06-04 DIAGNOSIS — R059 Cough, unspecified: Secondary | ICD-10-CM | POA: Diagnosis not present

## 2022-06-04 DIAGNOSIS — H01003 Unspecified blepharitis right eye, unspecified eyelid: Secondary | ICD-10-CM | POA: Insufficient documentation

## 2022-06-04 MED ORDER — HYDROCODONE BIT-HOMATROP MBR 5-1.5 MG/5ML PO SOLN
5.0000 mL | Freq: Four times a day (QID) | ORAL | 0 refills | Status: AC | PRN
  Filled 2022-06-04: qty 180, 9d supply, fill #0

## 2022-06-04 MED ORDER — DOXYCYCLINE HYCLATE 100 MG PO TABS
100.0000 mg | ORAL_TABLET | Freq: Two times a day (BID) | ORAL | 0 refills | Status: DC
Start: 2022-06-04 — End: 2022-07-08
  Filled 2022-06-04: qty 20, 10d supply, fill #0

## 2022-06-04 MED ORDER — AZITHROMYCIN 250 MG PO TABS
ORAL_TABLET | ORAL | 1 refills | Status: DC
  Filled 2022-06-04: qty 6, 5d supply, fill #0

## 2022-06-04 MED ORDER — PREDNISONE 10 MG PO TABS
ORAL_TABLET | ORAL | 0 refills | Status: DC
  Filled 2022-06-04: qty 18, 9d supply, fill #0

## 2022-06-04 MED ORDER — AZITHROMYCIN 250 MG PO TABS: ORAL_TABLET | ORAL | 1 refills | Status: AC

## 2022-06-04 NOTE — Assessment & Plan Note (Signed)
Mild to mod, for prednisone taper, inhaler prn,  to f/u any worsening symptoms ordered

## 2022-06-04 NOTE — Patient Instructions (Signed)
Your COVID testing was negative  You had the flu shot today  Please take all new medication as prescribed  - the antibiotic, cough medicine, and prednisone  Please continue all other medications as before, and refills have been done if requested.  Please have the pharmacy call with any other refills you may need.  Please keep your appointments with your specialists as you may have planned  Please go to the XRAY Department in the first floor for the x-ray testing  You will be contacted by phone if any changes need to be made immediately.  Otherwise, you will receive a letter about your results with an explanation, but please check with MyChart first.  Please remember to sign up for MyChart if you have not done so, as this will be important to you in the future with finding out test results, communicating by private email, and scheduling acute appointments online when needed.

## 2022-06-04 NOTE — Telephone Encounter (Signed)
Patient seen today and was prescribed doxycycline, she is asking for z-pack instead. Please advise.

## 2022-06-04 NOTE — Assessment & Plan Note (Signed)
Mild to mod, for antibx course, cough med prn, cxr r/o pna,   to f/u any worsening symptoms or concerns

## 2022-06-04 NOTE — Progress Notes (Signed)
Patient ID: Deanna Schwartz, female   DOB: 10/09/1980, 41 y.o.   MRN: [id number]        Chief Complaint: follow up cough and wheezing       HPI:  Deanna Schwartz is a 41 y.o. female Here with acute onset mild to mod 2-3 days ST, HA, general weakness and malaise, with prod cough greenish sputum, but Pt denies chest pain, increased sob or doe, wheezing, orthopnea, PND, increased LE swelling, palpitations, dizziness or syncope, except for mild wheezing starting last PM now well controlled by the inhaler.  Due for flu shot.  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 pylori infection 2008   Hx of tx + serology   IBS (irritable bowel syndrome)    Iron deficiency anemia 08/06/2011   Hgb 10.2 and MCV 79 on health screen labs    Irritable bowel syndrome    Knee pain    Obesity    Palpitations    Panic attack    Prediabetes    Shortness of breath    Ulcer    Vitamin D deficiency    Past Surgical History:  Procedure Laterality Date   FLEXIBLE SIGMOIDOSCOPY  04/14/2008   normal   NASAL SINUS SURGERY     UPPER GASTROINTESTINAL ENDOSCOPY      reports that she has never smoked. She has never used smokeless tobacco. She reports current alcohol use. She reports that she does not use drugs. family history includes Anxiety disorder in her mother; Cancer in her father; Colon polyps in her paternal uncle; Diabetes in her maternal aunt, maternal aunt, paternal grandmother, and another  family member; Hyperlipidemia in her father and mother; Hypertension in her father, mother, and another family member; Obesity in her mother. Allergies  Allergen Reactions   Augmentin [Amoxicillin-Pot Clavulanate] Rash   Cephalexin Rash   Iodine Swelling and Rash    Throat swelling after eating shrimp   Current Outpatient Medications on File Prior to Visit  Medication Sig Dispense Refill   albuterol (VENTOLIN HFA) 108 (90 Base) MCG/ACT inhaler Inhale 1 to 2 puffs into the lungs every 6 hours as needed for wheezing or shortness of breath. 18 g 0   albuterol (VENTOLIN HFA) 108 (90 Base) MCG/ACT inhaler INHALE 1 - 2 PUFFS INTO THE LUNGS EVERY 6 HOURS AS NEEDED FOR WHEEZING OR SHORTNESS OF BREATH 6.7 g 2   budesonide-formoterol (SYMBICORT) 160-4.5 MCG/ACT inhaler Inhale 2 puffs into the lungs 2 (two) times daily. 30.6 g 1   Cholecalciferol (VITAMIN D3) 50 MCG (2000 UT) capsule Take 2 capsules (4,000 Units total) by mouth daily. (Patient taking differently: Take 5,000 Units by mouth daily.) 200 capsule 3   Cyanocobalamin (VITAMIN B-12) 1000 MCG SUBL Place 1 tablet (1,000 mcg total) under the tongue daily. 100 tablet 3  dicyclomine (BENTYL) 10 MG capsule Take 1 capsule (10 mg total) by mouth 3 (three) times daily as needed for spasms (abdominal pain). 30 capsule 1   EPINEPHrine (EPIPEN 2-PAK) 0.3 mg/0.3 mL IJ SOAJ injection Inject 1 pen into the muscle as needed for anaphylaxis. 2 each 3   fluticasone (FLONASE) 50 MCG/ACT nasal spray Place 1 spray into both nostrils daily. 48 g 1   Magnesium 250 MG TABS Take 250 mg by mouth.     montelukast (SINGULAIR) 10 MG tablet Take 1 tablet (10 mg total) by mouth at bedtime. 90 tablet 3   Prenatal Vit-Fe Fumarate-FA (PRENATAL VITAMINS PO) Take 1 tablet by mouth daily.     Semaglutide-Weight Management (WEGOVY) 0.25 MG/0.5ML SOAJ Inject 0.25 mg into the skin once a week. 2 mL 2   sertraline (ZOLOFT) 50 MG tablet Take 1 tablet (50 mg total) by mouth daily. 90  tablet 3   triamcinolone cream (KENALOG) 0.1 % Apply up to twice daily to rash on body 2 weeks on, 2 weeks off as needed for flares 454 g 2   Vitamin D, Ergocalciferol, (DRISDOL) 1.25 MG (50000 UNIT) CAPS capsule Take 1 capsule by mouth every 7 days. 6 capsule 0   cetirizine (ZYRTEC ALLERGY) 10 MG tablet Take 1 tablet (10 mg total) by mouth at bedtime. 30 tablet 2   methylPREDNISolone (MEDROL DOSEPAK) 4 MG TBPK tablet Follow package directions 21 tablet 0   No current facility-administered medications on file prior to visit.        ROS:  All others reviewed and negative.  Objective        PE:  BP 122/78 (BP Location: Left Arm, Patient Position: Sitting, Cuff Size: Normal)   Pulse 94   Temp 98.3 F (36.8 C) (Oral)   Wt 248 lb (112.5 kg)   SpO2 97%   BMI 43.93 kg/m                 Constitutional: Pt appears in NAD, mild ill               HENT: Head: NCAT.                Right Ear: External ear normal.                 Left Ear: External ear normal.  Bilat tm's with mild erythema.  Max sinus areas mild tender.  Pharynx with mild erythema, no exudate               Eyes: . Pupils are equal, round, and reactive to light. Conjunctivae and EOM are normal but right upper eyelid with 2+ red, tender swelling               Nose: without d/c or deformity               Neck: Neck supple. Gross normal ROM               Cardiovascular: Normal rate and regular rhythm.                 Pulmonary/Chest: Effort normal and breath sounds without rales but with few mild wheezing.                Abd:  Soft, NT, ND, + BS, no organomegaly               Neurological: Pt is alert. At baseline orientation, motor grossly intact  Skin: Skin is warm. No rashes, no other new lesions, LE edema - none               Psychiatric: Pt behavior is normal without agitation   Micro: none  Cardiac tracings I have personally interpreted today:  none  Pertinent Radiological findings (summarize): none   Lab  Results  Component Value Date   WBC 7.7 04/21/2022   HGB 12.7 04/21/2022   HCT 38.9 04/21/2022   PLT 217.0 04/21/2022   GLUCOSE 84 04/21/2022   CHOL 190 04/21/2022   TRIG 332.0 (H) 04/21/2022   HDL 50.50 04/21/2022   LDLDIRECT 105.0 04/21/2022   LDLCALC 132 (H) 07/23/2021   ALT 15 04/21/2022   AST 18 04/21/2022   NA 138 04/21/2022   K 4.1 04/21/2022   CL 103 04/21/2022   CREATININE 0.77 04/21/2022   BUN 13 04/21/2022   CO2 27 04/21/2022   TSH 1.41 04/21/2022   INR 1.0 07/09/2020   HGBA1C 5.6 07/23/2021   POCT - COVID neg today  Assessment/Plan:  Deanna Schwartz is a 41 y.o. Black or African American [2] female with  has a past medical history of Allergic rhinitis, Anemia, Anxiety, Asthma, B12 deficiency with anti-parietal cell antibodies + (03/25/2011), Chronic constipation, Constipation, Depression, GERD (gastroesophageal reflux disease), H. pylori infection (2008), IBS (irritable bowel syndrome), Iron deficiency anemia (08/06/2011), Irritable bowel syndrome, Knee pain, Obesity, Palpitations, Panic attack, Prediabetes, Shortness of breath, Ulcer, and Vitamin D deficiency.  Blepharitis, right eye Also for zpack asd,  to f/u any worsening symptoms or concerns  Cough Mild to mod, for antibx course, cough med prn, cxr r/o pna,   to f/u any worsening symptoms or concerns   Wheezing Mild to mod, for prednisone taper, inhaler prn,  to f/u any worsening symptoms ordered  Followup: Return if symptoms worsen or fail to improve.  Cathlean Cower, MD 06/04/2022 1:19 PM [facility]

## 2022-06-04 NOTE — Telephone Encounter (Signed)
Ok this is done 

## 2022-06-04 NOTE — Assessment & Plan Note (Signed)
Also for zpack asd,  to f/u any worsening symptoms or concerns

## 2022-06-13 ENCOUNTER — Other Ambulatory Visit (HOSPITAL_COMMUNITY): Payer: Self-pay

## 2022-06-28 ENCOUNTER — Other Ambulatory Visit (HOSPITAL_COMMUNITY): Payer: Self-pay

## 2022-07-01 ENCOUNTER — Ambulatory Visit
Admission: RE | Admit: 2022-07-01 | Discharge: 2022-07-01 | Disposition: A | Discharge status: Home / Self Care | Payer: No Typology Code available for payment source | Source: Ambulatory Visit | Attending: Obstetrics and Gynecology | Admitting: Obstetrics and Gynecology

## 2022-07-01 ENCOUNTER — Other Ambulatory Visit (HOSPITAL_COMMUNITY): Payer: Self-pay

## 2022-07-01 ENCOUNTER — Other Ambulatory Visit: Payer: Self-pay | Admitting: Obstetrics and Gynecology

## 2022-07-01 ENCOUNTER — Ambulatory Visit: Payer: No Typology Code available for payment source

## 2022-07-01 DIAGNOSIS — R599 Enlarged lymph nodes, unspecified: Secondary | ICD-10-CM

## 2022-07-01 DIAGNOSIS — N644 Mastodynia: Secondary | ICD-10-CM

## 2022-07-01 DIAGNOSIS — N6320 Unspecified lump in the left breast, unspecified quadrant: Secondary | ICD-10-CM

## 2022-07-08 ENCOUNTER — Ambulatory Visit
Admission: EM | Admit: 2022-07-08 | Discharge: 2022-07-08 | Disposition: A | Discharge status: Home / Self Care | Payer: No Typology Code available for payment source | Attending: Emergency Medicine | Admitting: Emergency Medicine

## 2022-07-08 DIAGNOSIS — R030 Elevated blood-pressure reading, without diagnosis of hypertension: Secondary | ICD-10-CM | POA: Insufficient documentation

## 2022-07-08 DIAGNOSIS — J988 Other specified respiratory disorders: Secondary | ICD-10-CM | POA: Insufficient documentation

## 2022-07-08 DIAGNOSIS — J209 Acute bronchitis, unspecified: Secondary | ICD-10-CM | POA: Diagnosis not present

## 2022-07-08 DIAGNOSIS — Z1152 Encounter for screening for COVID-19: Secondary | ICD-10-CM | POA: Insufficient documentation

## 2022-07-08 DIAGNOSIS — B9789 Other viral agents as the cause of diseases classified elsewhere: Secondary | ICD-10-CM | POA: Insufficient documentation

## 2022-07-08 DIAGNOSIS — Z7952 Long term (current) use of systemic steroids: Secondary | ICD-10-CM | POA: Insufficient documentation

## 2022-07-08 DIAGNOSIS — J4541 Moderate persistent asthma with (acute) exacerbation: Secondary | ICD-10-CM | POA: Diagnosis not present

## 2022-07-08 LAB — RESP PANEL BY RT-PCR (FLU A&B, COVID) ARPGX2
Influenza A by PCR: NEGATIVE
Influenza B by PCR: NEGATIVE
SARS Coronavirus 2 by RT PCR: NEGATIVE

## 2022-07-08 MED ORDER — METHYLPREDNISOLONE SODIUM SUCC 125 MG IJ SOLR
125.0000 mg | Freq: Once | INTRAMUSCULAR | Status: AC
  Administered 2022-07-08: 125 mg via INTRAMUSCULAR

## 2022-07-08 MED ORDER — GUAIFENESIN 400 MG PO TABS: ORAL_TABLET | ORAL | 0 refills | Status: DC

## 2022-07-08 MED ORDER — METHYLPREDNISOLONE 4 MG PO TBPK: ORAL_TABLET | ORAL | 0 refills | Status: DC

## 2022-07-08 MED ORDER — IBUPROFEN 400 MG PO TABS: 400.0000 mg | ORAL_TABLET | Freq: Three times a day (TID) | ORAL | 0 refills | Status: DC | PRN

## 2022-07-08 MED ORDER — ALBUTEROL SULFATE (2.5 MG/3ML) 0.083% IN NEBU
2.5000 mg | INHALATION_SOLUTION | Freq: Once | RESPIRATORY_TRACT | Status: AC
  Administered 2022-07-08: 2.5 mg via RESPIRATORY_TRACT

## 2022-07-08 NOTE — ED Provider Notes (Signed)
[facility]    CSN: [id number] Arrival date & time: 07/08/22  1503    HISTORY   Chief Complaint  Patient presents with   Shortness of Breath   Generalized Body Aches   HPI Deanna Schwartz is a pleasant, 41 y.o. female who presents to urgent care today. Patient reports a 2-day history of shortness of breath, cough, soreness in her chest and upper back and generalized body aches.  Patient states has been taking Robitussin and using her albuterol inhaler.  Patient has mildly diminished oxygen on arrival with an elevated blood pressure, vital signs otherwise normal.  The history is provided by the patient.  Shortness of Breath  Past Medical History:  Diagnosis Date   Allergic rhinitis    Anemia    Anxiety    Asthma    B12 deficiency with anti-parietal cell antibodies + 03/25/2011   New 03/2011    Chronic constipation    Constipation    Depression    GERD (gastroesophageal reflux disease)    H. pylori infection 2008   Hx of tx + serology   IBS (irritable bowel syndrome)    Iron deficiency anemia 08/06/2011   Hgb 10.2 and MCV 79 on health screen labs    Irritable bowel syndrome    Knee pain    Obesity    Palpitations    Panic attack    Prediabetes    Shortness of breath    Ulcer    Vitamin D deficiency    Patient Active Problem List   Diagnosis Date Noted   Cough 06/04/2022   Wheezing 06/04/2022   Blepharitis, right eye 06/04/2022   Disorder of thyroid gland 04/21/2022   Vaginal irritation 04/21/2022   Acute conjunctivitis of both eyes 10/02/2021   Left knee pain 08/14/2021   Neck pain on left side 08/09/2021   Menorrhagia 04/29/2021   Acute bronchitis 12/25/2020   Epistaxis 07/10/2020   Hamstring strain, right, subsequent encounter 07/18/2019   Numbness 06/29/2019   Low back pain with sciatica 06/29/2019   Mid back pain 04/21/2019   Concussion with no loss of consciousness 04/21/2019   Nonallopathic lesion of cervical region 06/14/2018    Sternoclavicular joint pain, right 06/14/2018   Trapezius muscle spasm 05/22/2017   Knee MCL sprain 11/13/2016   Class 2 obesity without serious comorbidity with body mass index (BMI) of 39.0 to 39.9 in adult 10/14/2016   Nonallopathic lesion of sacral region 10/10/2016   Panic attack 08/15/2016   Coccydynia 06/10/2016   Vasovagal syncope 03/14/2016   RUQ pain 03/14/2016   Vitamin D deficiency 11/22/2015   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   Nonallopathic lesion of thoracic region 02/21/2015   Nonallopathic lesion of lumbosacral region 02/21/2015   Nonallopathic lesion-rib cage 02/21/2015   Piriformis syndrome of left side 02/21/2015   Cervicalgia 01/22/2015   Obesity (BMI 35.0-39.9 without comorbidity) 01/22/2015   Muscle tension headache 01/22/2015   Iron deficiency anemia due to chronic blood loss - menses 01/03/2015   Chronic chest wall pain - mostly left side 01/03/2015   Well adult exam 11/13/2014   B12 deficiency 11/13/2014   Acromioclavicular joint separation, type 1 07/12/2014   Trochanteric bursitis of left hip 06/20/2014   Patellofemoral pain syndrome 04/07/2014   Acute medial meniscal tear 01/25/2014   Weight gain 11/18/2013   Anemia, iron deficiency 11/18/2013   Iron deficiency anemia 08/06/2011   Arthralgia 03/24/2011   IBS 04/07/2008   Palpitations 01/11/2008   Generalized anxiety disorder 11/25/2007  Allergic rhinitis 11/25/2007   GERD 11/25/2007   SINUSITIS, CHRONIC 04/03/2007   Asthma 04/03/2007   Past Surgical History:  Procedure Laterality Date   FLEXIBLE SIGMOIDOSCOPY  04/14/2008   normal   NASAL SINUS SURGERY     UPPER GASTROINTESTINAL ENDOSCOPY     OB History     Gravida  4   Para  3   Term  3   Preterm      AB  1   Living  3      SAB  1   IAB      Ectopic      Multiple      Live Births             Home Medications    Prior to Admission medications   Medication Sig Start Date End Date Taking? Authorizing Provider   albuterol (VENTOLIN HFA) 108 (90 Base) MCG/ACT inhaler Inhale 1 to 2 puffs into the lungs every 6 hours as needed for wheezing or shortness of breath. 10/28/21     albuterol (VENTOLIN HFA) 108 (90 Base) MCG/ACT inhaler INHALE 1 - 2 PUFFS INTO THE LUNGS EVERY 6 HOURS AS NEEDED FOR WHEEZING OR SHORTNESS OF BREATH 04/21/22 04/21/23  Plotnikov, Evie Lacks, MD  budesonide-formoterol (SYMBICORT) 160-4.5 MCG/ACT inhaler Inhale 2 puffs into the lungs 2 (two) times daily. 02/18/22 08/17/22  Lynden Oxford Scales, PA-C  cetirizine (ZYRTEC ALLERGY) 10 MG tablet Take 1 tablet (10 mg total) by mouth at bedtime. 02/18/22 05/19/22  Lynden Oxford Scales, PA-C  Cholecalciferol (VITAMIN D3) 50 MCG (2000 UT) capsule Take 2 capsules (4,000 Units total) by mouth daily. Patient taking differently: Take 5,000 Units by mouth daily. 07/10/20   Plotnikov, Evie Lacks, MD  Cyanocobalamin (VITAMIN B-12) 1000 MCG SUBL Place 1 tablet (1,000 mcg total) under the tongue daily. 04/22/22   Plotnikov, Evie Lacks, MD  dicyclomine (BENTYL) 10 MG capsule Take 1 capsule (10 mg total) by mouth 3 (three) times daily as needed for spasms (abdominal pain). 09/27/21   Noralyn Pick, NP  doxycycline (VIBRA-TABS) 100 MG tablet Take 1 tablet (100 mg total) by mouth 2 (two) times daily. 06/04/22   Biagio Borg, MD  EPINEPHrine (EPIPEN 2-PAK) 0.3 mg/0.3 mL IJ SOAJ injection Inject 1 pen into the muscle as needed for anaphylaxis. 04/21/22   Plotnikov, Evie Lacks, MD  fluticasone (FLONASE) 50 MCG/ACT nasal spray Place 1 spray into both nostrils daily. 02/18/22   Lynden Oxford Scales, PA-C  Magnesium 250 MG TABS Take 250 mg by mouth.    [provider]  methylPREDNISolone (MEDROL DOSEPAK) 4 MG TBPK tablet Follow package directions 04/21/22   Plotnikov, Evie Lacks, MD  montelukast (SINGULAIR) 10 MG tablet Take 1 tablet (10 mg total) by mouth at bedtime. 12/09/21   Hoyt Koch, MD  predniSONE (DELTASONE) 10 MG tablet Take 3 tablets by  mouth daily for 3 days, 2 tablets daily for 3 days, then 1 tablet daily for 3 days. 06/04/22   Biagio Borg, MD  Prenatal Vit-Fe Fumarate-FA (PRENATAL VITAMINS PO) Take 1 tablet by mouth daily.    [provider]  Semaglutide-Weight Management (WEGOVY) 0.25 MG/0.5ML SOAJ Inject 0.25 mg into the skin once a week. 04/21/22   Plotnikov, Evie Lacks, MD  sertraline (ZOLOFT) 50 MG tablet Take 1 tablet (50 mg total) by mouth daily. 05/14/22   Plotnikov, Evie Lacks, MD  triamcinolone cream (KENALOG) 0.1 % Apply up to twice daily to rash on body 2 weeks on, 2  weeks off as needed for flares 12/25/21     Vitamin D, Ergocalciferol, (DRISDOL) 1.25 MG (50000 UNIT) CAPS capsule Take 1 capsule by mouth every 7 days. 04/22/22   Plotnikov, Evie Lacks, MD    Family History Family History  Problem Relation Age of Onset   Hyperlipidemia Mother    Hypertension Mother    Anxiety disorder Mother    Obesity Mother    Cancer Father    Hyperlipidemia Father    Hypertension Father    Diabetes Paternal Grandmother    Diabetes Maternal Aunt    Diabetes Maternal Aunt    Colon polyps Paternal Uncle        x4   Diabetes Other        grandmother   Hypertension Other    Colon cancer Neg Hx    Social History Social History   Tobacco Use   Smoking status: Never   Smokeless tobacco: Never  Vaping Use   Vaping Use: Never used  Substance Use Topics   Alcohol use: Yes    Alcohol/week: 0.0 standard drinks of alcohol    Comment: Social maybe twice amonth, beer   Drug use: No   Allergies   Augmentin [amoxicillin-pot clavulanate], Cephalexin, and Iodine  Review of Systems Review of Systems  Respiratory:  Positive for shortness of breath.    Pertinent findings revealed after performing a 14 point review of systems has been noted in the history of present illness.  Physical Exam Triage Vital Signs ED Triage Vitals  Enc Vitals Group     BP 07/05/21 0827 (!) 147/82     Pulse Rate 07/05/21 0827 72     Resp  07/05/21 0827 18     Temp 07/05/21 0827 98.3 F (36.8 C)     Temp Source 07/05/21 0827 Oral     SpO2 07/05/21 0827 98 %     Weight --      Height --      Head Circumference --      Peak Flow --      Pain Score 07/05/21 0826 5     Pain Loc --      Pain Edu? --      Excl. in [place]? --   No data found.  Updated Vital Signs BP (!) 142/87 (BP Location: Left Wrist)   Pulse 87   Temp 98.2 F (36.8 C) (Oral)   Resp 16   LMP 06/23/2022   SpO2 94%   Physical Exam Vitals and nursing note reviewed.  Constitutional:      General: She is not in acute distress.    Appearance: Normal appearance. She is not ill-appearing.  HENT:     Head: Normocephalic and atraumatic.     Salivary Glands: Right salivary gland is not diffusely enlarged or tender. Left salivary gland is not diffusely enlarged or tender.     Right Ear: Ear canal and external ear normal. No drainage. A middle ear effusion is present. There is no impacted cerumen. Tympanic membrane is bulging. Tympanic membrane is not injected or erythematous.     Left Ear: Ear canal and external ear normal. No drainage. A middle ear effusion is present. There is no impacted cerumen. Tympanic membrane is bulging. Tympanic membrane is not injected or erythematous.     Ears:     Comments: Bilateral EACs normal, both TMs bulging with clear fluid    Nose: Rhinorrhea present. No nasal deformity, septal deviation, signs of injury, nasal tenderness, mucosal edema or  congestion. Rhinorrhea is clear.     Right Nostril: Occlusion present. No foreign body, epistaxis or septal hematoma.     Left Nostril: Occlusion present. No foreign body, epistaxis or septal hematoma.     Right Turbinates: Enlarged, swollen and pale.     Left Turbinates: Enlarged, swollen and pale.     Right Sinus: No maxillary sinus tenderness or frontal sinus tenderness.     Left Sinus: No maxillary sinus tenderness or frontal sinus tenderness.     Mouth/Throat:     Lips: Pink. No lesions.      Mouth: Mucous membranes are moist. No oral lesions.     Pharynx: Oropharynx is clear. Uvula midline. No posterior oropharyngeal erythema or uvula swelling.     Tonsils: No tonsillar exudate. 0 on the right. 0 on the left.     Comments: Postnasal drip Eyes:     General: Lids are normal.        Right eye: No discharge.        Left eye: No discharge.     Extraocular Movements: Extraocular movements intact.     Conjunctiva/sclera: Conjunctivae normal.     Right eye: Right conjunctiva is not injected.     Left eye: Left conjunctiva is not injected.  Neck:     Trachea: Trachea and phonation normal.  Cardiovascular:     Rate and Rhythm: Normal rate and regular rhythm.     Pulses: Normal pulses.     Heart sounds: Normal heart sounds. No murmur heard.    No friction rub. No gallop.  Pulmonary:     Effort: Pulmonary effort is normal. No tachypnea, bradypnea, accessory muscle usage, prolonged expiration or respiratory distress.     Breath sounds: No stridor, decreased air movement or transmitted upper airway sounds. Examination of the right-upper field reveals wheezing. Examination of the left-upper field reveals wheezing. Examination of the right-middle field reveals wheezing. Examination of the left-middle field reveals wheezing. Examination of the right-lower field reveals wheezing. Examination of the left-lower field reveals wheezing. Wheezing present. No decreased breath sounds, rhonchi or rales.  Chest:     Chest wall: No tenderness.  Musculoskeletal:        General: Normal range of motion.     Cervical back: Normal range of motion and neck supple. Normal range of motion.  Lymphadenopathy:     Cervical: No cervical adenopathy.  Skin:    General: Skin is warm and dry.     Findings: No erythema or rash.  Neurological:     General: No focal deficit present.     Mental Status: She is alert and oriented to person, place, and time.  Psychiatric:        Mood and Affect: Mood normal.         Behavior: Behavior normal.     Visual Acuity Right Eye Distance:   Left Eye Distance:   Bilateral Distance:    Right Eye Near:   Left Eye Near:    Bilateral Near:     UC Couse / Diagnostics / Procedures:     Radiology No results found.  Procedures Procedures (including critical care time) EKG  Pending results:  Labs Reviewed  RESP PANEL BY RT-PCR (FLU A&B, COVID) [id number]    Medications Ordered in UC: Medications  methylPREDNISolone sodium succinate (SOLU-MEDROL) 125 mg/2 mL injection 125 mg (125 mg Intramuscular Given 07/08/22 1734)  albuterol (PROVENTIL) (2.5 MG/3ML) 0.083% nebulizer solution 2.5 mg (2.5 mg Nebulization Given 07/08/22 1734)  UC Diagnoses / Final Clinical Impressions(s)   I have reviewed the triage vital signs and the nursing notes.  Pertinent labs & imaging results that were available during my care of the patient were reviewed by me and considered in my medical decision making (see chart for details).    Final diagnoses:  Viral respiratory illness  Moderate persistent asthma with acute bronchitis and acute exacerbation   Breath sounds significantly improved after nebulized albuterol treatment.  Patient reports improved work of breathing as well.  Patient advised to continue all allergy medications as prescribed with the addition of methylprednisolone to help resolve wheezing still appreciated on exam after nebulizer treatment.  COVID-19 and influenza testing pending, will notify patient of results once received  Patient has history of elevated blood sugar levels without diagnosis of type 2 diabetes and moderate persistent asthma with frequent exacerbations, GFR was greater than 60 on April 21, 2022, patient would benefit from Paxlovid if COVID-19 test is positive.  Due to duration of symptoms, she would not benefit from Tamiflu.  ED Prescriptions     Medication Sig Dispense Auth. Provider   guaifenesin (HUMIBID E) 400 MG TABS tablet Take 1  tablet 3 times daily as needed for chest congestion and cough 21 tablet Lynden Oxford Scales, PA-C   ibuprofen (ADVIL) 400 MG tablet Take 1 tablet (400 mg total) by mouth every 8 (eight) hours as needed for up to 30 doses. 30 tablet Lynden Oxford Scales, PA-C   methylPREDNISolone (MEDROL DOSEPAK) 4 MG TBPK tablet Take 24 mg on day 1, 20 mg on day 2, 16 mg on day 3, 12 mg on day 4, 8 mg on day 5, 4 mg on day 6.  Take all tablets in each row at once, do not spread tablets out throughout the day. 21 tablet Lynden Oxford Scales, PA-C      PDMP not reviewed this encounter.  Disposition Upon Discharge:  Condition: stable for discharge home Home: take medications as prescribed; routine discharge instructions as discussed; follow up as advised.  Patient presented with an acute illness with associated systemic symptoms and significant discomfort requiring urgent management. In my opinion, this is a condition that a prudent lay person (someone who possesses an average knowledge of health and medicine) may potentially expect to result in complications if not addressed urgently such as respiratory distress, impairment of bodily function or dysfunction of bodily organs.   Routine symptom specific, illness specific and/or disease specific instructions were discussed with the patient and/or caregiver at length.   As such, the patient has been evaluated and assessed, work-up was performed and treatment was provided in alignment with urgent care protocols and evidence based medicine.  Patient/parent/caregiver has been advised that the patient may require follow up for further testing and treatment if the symptoms continue in spite of treatment, as clinically indicated and appropriate.  If the patient was tested for COVID-19, Influenza and/or RSV, then the patient/parent/guardian was advised to isolate at home pending the results of his/her diagnostic coronavirus test and potentially longer if they're  positive. I have also advised pt that if his/her COVID-19 test returns positive, it's recommended to self-isolate for at least 10 days after symptoms first appeared AND until fever-free for 24 hours without fever reducer AND other symptoms have improved or resolved. Discussed self-isolation recommendations as well as instructions for household member/close contacts as per the [facility] and [place] DHHS, and also gave patient the [place] packet with this information.  Patient/parent/caregiver has been advised to  return to the [facility] or PCP in 3-5 days if no better; to PCP or the Emergency Department if new signs and symptoms develop, or if the current signs or symptoms continue to change or worsen for further workup, evaluation and treatment as clinically indicated and appropriate  The patient will follow up with their current PCP if and as advised. If the patient does not currently have a PCP we will assist them in obtaining one.   The patient may need specialty follow up if the symptoms continue, in spite of conservative treatment and management, for further workup, evaluation, consultation and treatment as clinically indicated and appropriate.  Patient/parent/caregiver verbalized understanding and agreement of plan as discussed.  All questions were addressed during visit.  Please see discharge instructions below for further details of plan.  Discharge Instructions:   Discharge Instructions      The results of your COVID-19 and influenza tests will be posted to your MyChart once they are complete, this should be later this evening.  If either result is positive, you will receive a phone call with further instructions.  In the meantime, please continue all of your medications as prescribed with the exception of your albuterol which I want you to use 4 times daily on a scheduled basis for the next several days until you feel like you are starting to feel better.  You received an injection of steroids during your  visit today and your lungs sounded much better after your albuterol treatment.  I would like for you to complete a steroid Dosepak as well, I sent a prescription to your pharmacy.  I have also added prescriptions for Mucinex that you can take 3 times daily to help thin the secretions in your lungs and make coughing easier.  I have also sent a prescription for ibuprofen 400 mg to your pharmacy that I want you to take 3 times daily to reduce respiratory inflammation.  Please take these 2 medications for the next 2 to 3 days until you are are starting to feel better.  Thank you for coming back to see [place] here urgent care.  It is a pleasure to see you and we appreciate the opportunity to participate in your care.        This office note has been dictated using Museum/gallery curator.  Unfortunately, this method of dictation can sometimes lead to typographical or grammatical errors.  I apologize for your inconvenience in advance if this occurs.  Please do not hesitate to reach out to me if clarification is needed.      Lynden Oxford Scales, PA-C 07/09/22 1659

## 2022-07-08 NOTE — ED Triage Notes (Signed)
Pt c/o sob, cough, and chest/ back soreness and body aches that began Sunday.  Home interventions: robitussin, albuterol inhaler

## 2022-07-08 NOTE — Discharge Instructions (Addendum)
The results of your COVID-19 and influenza tests will be posted to your MyChart once they are complete, this should be later this evening.  If either result is positive, you will receive a phone call with further instructions.  In the meantime, please continue all of your medications as prescribed with the exception of your albuterol which I want you to use 4 times daily on a scheduled basis for the next several days until you feel like you are starting to feel better.  You received an injection of steroids during your visit today and your lungs sounded much better after your albuterol treatment.  I would like for you to complete a steroid Dosepak as well, I sent a prescription to your pharmacy.  I have also added prescriptions for Mucinex that you can take 3 times daily to help thin the secretions in your lungs and make coughing easier.  I have also sent a prescription for ibuprofen 400 mg to your pharmacy that I want you to take 3 times daily to reduce respiratory inflammation.  Please take these 2 medications for the next 2 to 3 days until you are are starting to feel better.  Thank you for coming back to see [place] here urgent care.  It is a pleasure to see you and we appreciate the opportunity to participate in your care.

## 2022-07-09 HISTORY — PX: BREAST BIOPSY: SHX20

## 2022-07-10 ENCOUNTER — Ambulatory Visit
Admission: RE | Admit: 2022-07-10 | Discharge: 2022-07-10 | Disposition: A | Discharge status: Home / Self Care | Payer: No Typology Code available for payment source | Source: Ambulatory Visit | Attending: Obstetrics and Gynecology | Admitting: Obstetrics and Gynecology

## 2022-07-10 ENCOUNTER — Other Ambulatory Visit: Payer: Self-pay | Admitting: Obstetrics and Gynecology

## 2022-07-10 DIAGNOSIS — R599 Enlarged lymph nodes, unspecified: Secondary | ICD-10-CM

## 2022-07-10 DIAGNOSIS — N6320 Unspecified lump in the left breast, unspecified quadrant: Secondary | ICD-10-CM

## 2022-07-17 ENCOUNTER — Other Ambulatory Visit (HOSPITAL_COMMUNITY): Payer: Self-pay

## 2022-07-22 ENCOUNTER — Other Ambulatory Visit: Payer: Self-pay | Admitting: *Deleted

## 2022-07-22 DIAGNOSIS — D500 Iron deficiency anemia secondary to blood loss (chronic): Secondary | ICD-10-CM

## 2022-07-23 ENCOUNTER — Inpatient Hospital Stay (HOSPITAL_BASED_OUTPATIENT_CLINIC_OR_DEPARTMENT_OTHER): Payer: No Typology Code available for payment source | Admitting: Oncology

## 2022-07-23 ENCOUNTER — Inpatient Hospital Stay: Payer: No Typology Code available for payment source | Attending: Oncology

## 2022-07-23 VITALS — BP 136/70 | HR 89 | Temp 97.9°F | Resp 19 | Ht 63.0 in | Wt 255.1 lb

## 2022-07-23 DIAGNOSIS — D509 Iron deficiency anemia, unspecified: Secondary | ICD-10-CM

## 2022-07-23 DIAGNOSIS — Z79899 Other long term (current) drug therapy: Secondary | ICD-10-CM | POA: Diagnosis not present

## 2022-07-23 DIAGNOSIS — D500 Iron deficiency anemia secondary to blood loss (chronic): Secondary | ICD-10-CM | POA: Insufficient documentation

## 2022-07-23 DIAGNOSIS — N920 Excessive and frequent menstruation with regular cycle: Secondary | ICD-10-CM | POA: Insufficient documentation

## 2022-07-23 LAB — CBC WITH DIFFERENTIAL (CANCER CENTER ONLY)
Abs Immature Granulocytes: 0.02 10*3/uL (ref 0.00–0.07)
Basophils Absolute: 0.1 10*3/uL (ref 0.0–0.1)
Basophils Relative: 1 %
Eosinophils Absolute: 0.6 10*3/uL — ABNORMAL HIGH (ref 0.0–0.5)
Eosinophils Relative: 8 %
HCT: 35.4 % — ABNORMAL LOW (ref 36.0–46.0)
Hemoglobin: 11.8 g/dL — ABNORMAL LOW (ref 12.0–15.0)
Immature Granulocytes: 0 %
Lymphocytes Relative: 25 %
Lymphs Abs: 1.7 10*3/uL (ref 0.7–4.0)
MCH: 29.6 pg (ref 26.0–34.0)
MCHC: 33.3 g/dL (ref 30.0–36.0)
MCV: 88.9 fL (ref 80.0–100.0)
Monocytes Absolute: 0.4 10*3/uL (ref 0.1–1.0)
Monocytes Relative: 6 %
Neutro Abs: 4.2 10*3/uL (ref 1.7–7.7)
Neutrophils Relative %: 60 %
Platelet Count: 250 10*3/uL (ref 150–400)
RBC: 3.98 MIL/uL (ref 3.87–5.11)
RDW: 12.4 % (ref 11.5–15.5)
WBC Count: 6.9 10*3/uL (ref 4.0–10.5)
nRBC: 0 % (ref 0.0–0.2)

## 2022-07-23 LAB — IRON AND IRON BINDING CAPACITY (CC-WL,HP ONLY)
Iron: 58 ug/dL (ref 28–170)
Saturation Ratios: 16 % (ref 10.4–31.8)
TIBC: 356 ug/dL (ref 250–450)
UIBC: 298 ug/dL

## 2022-07-23 NOTE — Progress Notes (Signed)
Hematology and Oncology Follow Up Visit  Deanna Schwartz [id number] 1981-08-12 41 y.o. 07/23/2022 2:41 PM Plotnikov, Evie Lacks, MDPlotnikov, Evie Lacks, MD   Principle Diagnosis: 41 year old woman with iron deficiency anemia diagnosed in 2019.  She was found to have ferritin of 7 related to chronic menstrual blood losses.  Oral iron therapy has been ineffective.    Prior Therapy:   Status post Feraheme infusion completed on Jan 06, 2018 for a total of 1020 mg.  She is status post ferric gluconate for a total of 375 mg completed in October 2021.  She received a total of 1000 mg of Venofer completed in June 2023.  Current therapy: Oral iron daily.  She is not currently taking prenatal multivitamins.  Interim History: Deanna Schwartz returns today for a follow-up.  Since her last visit, she reports no major changes in her health.  She tolerated IV iron infusion in June without any complications.  She did notice some improvement in her energy.  Her hemoglobin did improve in August up to 12.3 but currently down to 11.8.  She continues to have heavy menstrual bleeding and scheduled to have hysterectomy in January.  She denies excessive fatigue, tiredness.  She denies any hematochezia or melena.        Medications: Updated on review. Current Outpatient Medications  Medication Sig Dispense Refill   albuterol (VENTOLIN HFA) 108 (90 Base) MCG/ACT inhaler INHALE 1 - 2 PUFFS INTO THE LUNGS EVERY 6 HOURS AS NEEDED FOR WHEEZING OR SHORTNESS OF BREATH 6.7 g 2   budesonide-formoterol (SYMBICORT) 160-4.5 MCG/ACT inhaler Inhale 2 puffs into the lungs 2 (two) times daily. 30.6 g 1   cetirizine (ZYRTEC ALLERGY) 10 MG tablet Take 1 tablet (10 mg total) by mouth at bedtime. 30 tablet 2   Cholecalciferol (VITAMIN D3) 50 MCG (2000 UT) capsule Take 2 capsules (4,000 Units total) by mouth daily. (Patient taking differently: Take 5,000 Units by mouth daily.) 200 capsule 3   Cyanocobalamin (VITAMIN B-12) 1000  MCG SUBL Place 1 tablet (1,000 mcg total) under the tongue daily. 100 tablet 3   dicyclomine (BENTYL) 10 MG capsule Take 1 capsule (10 mg total) by mouth 3 (three) times daily as needed for spasms (abdominal pain). 30 capsule 1   EPINEPHrine (EPIPEN 2-PAK) 0.3 mg/0.3 mL IJ SOAJ injection Inject 1 pen into the muscle as needed for anaphylaxis. 2 each 3   fluticasone (FLONASE) 50 MCG/ACT nasal spray Place 1 spray into both nostrils daily. 48 g 1   guaifenesin (HUMIBID E) 400 MG TABS tablet Take 1 tablet 3 times daily as needed for chest congestion and cough 21 tablet 0   ibuprofen (ADVIL) 400 MG tablet Take 1 tablet (400 mg total) by mouth every 8 (eight) hours as needed for up to 30 doses. 30 tablet 0   Magnesium 250 MG TABS Take 250 mg by mouth.     methylPREDNISolone (MEDROL DOSEPAK) 4 MG TBPK tablet Take 24 mg on day 1, 20 mg on day 2, 16 mg on day 3, 12 mg on day 4, 8 mg on day 5, 4 mg on day 6.  Take all tablets in each row at once, do not spread tablets out throughout the day. 21 tablet 0   montelukast (SINGULAIR) 10 MG tablet Take 1 tablet (10 mg total) by mouth at bedtime. 90 tablet 3   Prenatal Vit-Fe Fumarate-FA (PRENATAL VITAMINS PO) Take 1 tablet by mouth daily.     Semaglutide-Weight Management (WEGOVY) 0.25 MG/0.5ML SOAJ Inject 0.25  mg into the skin once a week. 2 mL 2   sertraline (ZOLOFT) 50 MG tablet Take 1 tablet (50 mg total) by mouth daily. 90 tablet 3   triamcinolone cream (KENALOG) 0.1 % Apply up to twice daily to rash on body 2 weeks on, 2 weeks off as needed for flares 454 g 2   Vitamin D, Ergocalciferol, (DRISDOL) 1.25 MG (50000 UNIT) CAPS capsule Take 1 capsule by mouth every 7 days. 6 capsule 0   No current facility-administered medications for this visit.     Allergies:  Allergies  Allergen Reactions   Augmentin [Amoxicillin-Pot Clavulanate] Rash   Cephalexin Rash   Iodine Swelling and Rash    Throat swelling after eating shrimp        Physical  Exam:    Blood pressure 136/70, pulse 89, temperature 97.9 F (36.6 C), temperature source Temporal, resp. rate 19, height '5\' 3"'$  (1.6 m), weight 255 lb 1.6 oz (115.7 kg), last menstrual period 06/23/2022, SpO2 99 %.    ECOG: 0     General appearance: Alert, awake without any distress. Head: Atraumatic without abnormalities Oropharynx: Without any thrush or ulcers. Eyes: No scleral icterus. Lymph nodes: No lymphadenopathy noted in the cervical, supraclavicular, or axillary nodes Heart:regular rate and rhythm, without any murmurs or gallops.   Lung: Clear to auscultation without any rhonchi, wheezes or dullness to percussion. Abdomin: Soft, nontender without any shifting dullness or ascites. Musculoskeletal: No clubbing or cyanosis. Neurological: No motor or sensory deficits. Skin: No rashes or lesions.     Lab Results: Lab Results  Component Value Date   WBC 7.7 04/21/2022   HGB 12.7 04/21/2022   HCT 38.9 04/21/2022   MCV 86.7 04/21/2022   PLT 217.0 04/21/2022     Chemistry      Component Value Date/Time   NA 138 04/21/2022 1536   NA 136 07/23/2021 1612   K 4.1 04/21/2022 1536   CL 103 04/21/2022 1536   CO2 27 04/21/2022 1536   BUN 13 04/21/2022 1536   BUN 12 07/23/2021 1612   CREATININE 0.77 04/21/2022 1536   CREATININE 0.82 01/25/2021 0936      Component Value Date/Time   CALCIUM 9.4 04/21/2022 1536   ALKPHOS 60 04/21/2022 1536   AST 18 04/21/2022 1536   AST 25 12/28/2017 1114   ALT 15 04/21/2022 1536   ALT 21 12/28/2017 1114   BILITOT 0.3 04/21/2022 1536   BILITOT 0.3 07/23/2021 1612   BILITOT 0.3 12/28/2017 1114       Impression and Plan:   41 year old woman with:   1.  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    She is status post IV iron infusion on multiple occasions most recent was in June 2023.  Repeat iron studies are currently pending and risks and benefits of repeat IV iron were discussed.   She has tolerated therapy very well and complications such as arthralgias, myalgias and rarely anaphylaxis were reiterated.  Her hemoglobin today is 11.8 and is improved but not normal.  Iron studies are currently pending.  We will await the results of her iron levels and we will arrange for IV iron infusion in preparation for her hysterectomy.  She will continue to take oral iron replacement daily for maintenance purposes.  Anticipate less need for IV iron infusion in the future after hysterectomy.    2.  Heavy menstrual cycles: She is scheduled to have a hysterectomy in January 2024.  3.  Follow-up: In 6 months for repeat follow-up.  30  minutes were spent on this visit.  The time was dedicated to reviewing laboratory data, disease status update, treatment choices and future plan of care discussion.     Zola Button, MD 11/15/20232:41 PM

## 2022-07-24 ENCOUNTER — Encounter: Payer: Self-pay | Admitting: Internal Medicine

## 2022-07-24 ENCOUNTER — Telehealth: Payer: Self-pay

## 2022-07-24 ENCOUNTER — Other Ambulatory Visit (HOSPITAL_COMMUNITY): Payer: Self-pay

## 2022-07-24 LAB — FERRITIN: Ferritin: 36 ng/mL (ref 11–307)

## 2022-07-24 NOTE — Telephone Encounter (Signed)
-----   Message from Arna Snipe, RN sent at 07/24/2022 10:10 AM EST -----  ----- Message ----- From: Wyatt Portela, MD Sent: 07/24/2022   8:47 AM EST To: Arna Snipe, RN  Please let her know her iron is improved but still needs more infusion of iron

## 2022-07-24 NOTE — Telephone Encounter (Signed)
Pt advised of the need for IV iron and scheduling will call when approved by ins.

## 2022-07-27 ENCOUNTER — Other Ambulatory Visit: Payer: Self-pay | Admitting: Internal Medicine

## 2022-07-27 DIAGNOSIS — E7849 Other hyperlipidemia: Secondary | ICD-10-CM

## 2022-08-01 MED FILL — Iron Sucrose Inj 20 MG/ML (Fe Equiv): INTRAVENOUS | Qty: 10 | Status: AC

## 2022-08-02 ENCOUNTER — Telehealth: Payer: Self-pay | Admitting: *Deleted

## 2022-08-02 ENCOUNTER — Inpatient Hospital Stay: Payer: No Typology Code available for payment source

## 2022-08-02 NOTE — Telephone Encounter (Signed)
Pt was NO SHOW for Iron infusion appt today  at  1100.  Called and left message on voice mail informing pt that she could come to clinic by 1145 for infusion ( latest time ) today.  Otherwise, pt needs to call office on Monday to reschedule missed appt.

## 2022-08-08 ENCOUNTER — Inpatient Hospital Stay: Payer: No Typology Code available for payment source | Attending: Oncology

## 2022-08-08 VITALS — BP 112/51 | HR 79 | Resp 16

## 2022-08-08 DIAGNOSIS — N920 Excessive and frequent menstruation with regular cycle: Secondary | ICD-10-CM | POA: Diagnosis present

## 2022-08-08 DIAGNOSIS — D500 Iron deficiency anemia secondary to blood loss (chronic): Secondary | ICD-10-CM | POA: Insufficient documentation

## 2022-08-08 MED ORDER — SODIUM CHLORIDE 0.9 % IV SOLN: Freq: Once | INTRAVENOUS | Status: AC

## 2022-08-08 MED ORDER — SODIUM CHLORIDE 0.9 % IV SOLN
200.0000 mg | Freq: Once | INTRAVENOUS | Status: AC
  Administered 2022-08-08: 200 mg via INTRAVENOUS
  Filled 2022-08-08: qty 200

## 2022-08-08 NOTE — Patient Instructions (Signed)

## 2022-08-11 ENCOUNTER — Telehealth: Payer: Self-pay | Admitting: Adult Health

## 2022-08-11 NOTE — Telephone Encounter (Signed)
Called patient regarding upcoming appointments, patient has been informed of providers departure and follow-up wit new app next year.

## 2022-08-15 ENCOUNTER — Inpatient Hospital Stay: Payer: No Typology Code available for payment source

## 2022-08-15 ENCOUNTER — Other Ambulatory Visit (INDEPENDENT_AMBULATORY_CARE_PROVIDER_SITE_OTHER): Payer: No Typology Code available for payment source

## 2022-08-15 VITALS — BP 127/78 | HR 76 | Temp 97.8°F | Resp 18

## 2022-08-15 DIAGNOSIS — D500 Iron deficiency anemia secondary to blood loss (chronic): Secondary | ICD-10-CM

## 2022-08-15 DIAGNOSIS — E7849 Other hyperlipidemia: Secondary | ICD-10-CM | POA: Diagnosis not present

## 2022-08-15 LAB — COMPREHENSIVE METABOLIC PANEL
ALT: 17 U/L (ref 0–35)
AST: 16 U/L (ref 0–37)
Albumin: 4.3 g/dL (ref 3.5–5.2)
Alkaline Phosphatase: 52 U/L (ref 39–117)
BUN: 16 mg/dL (ref 6–23)
CO2: 28 mEq/L (ref 19–32)
Calcium: 9.4 mg/dL (ref 8.4–10.5)
Chloride: 103 mEq/L (ref 96–112)
Creatinine, Ser: 0.8 mg/dL (ref 0.40–1.20)
GFR: 92.11 mL/min (ref >60.00)
Glucose, Bld: 83 mg/dL (ref 70–99)
Potassium: 3.7 mEq/L (ref 3.5–5.1)
Sodium: 138 mEq/L (ref 135–145)
Total Bilirubin: 0.4 mg/dL (ref 0.2–1.2)
Total Protein: 7.6 g/dL (ref 6.0–8.3)

## 2022-08-15 LAB — LIPID PANEL
Cholesterol: 177 mg/dL (ref 0–200)
HDL: 43.3 mg/dL (ref >39.00)
LDL Cholesterol: 102 mg/dL — ABNORMAL HIGH (ref 0–99)
NonHDL: 133.21
Total CHOL/HDL Ratio: 4
Triglycerides: 157 mg/dL — ABNORMAL HIGH (ref 0.0–149.0)
VLDL: 31.4 mg/dL (ref 0.0–40.0)

## 2022-08-15 MED ORDER — SODIUM CHLORIDE 0.9 % IV SOLN
200.0000 mg | Freq: Once | INTRAVENOUS | Status: AC
  Administered 2022-08-15: 200 mg via INTRAVENOUS
  Filled 2022-08-15: qty 200

## 2022-08-15 MED ORDER — SODIUM CHLORIDE 0.9 % IV SOLN: Freq: Once | INTRAVENOUS | Status: AC

## 2022-08-15 NOTE — Patient Instructions (Signed)

## 2022-08-15 NOTE — Progress Notes (Signed)
Patient declined to stay for her 30 minute observation post iron infusion.  VSS and patient ambulatory to lobby at discharge.

## 2022-08-18 ENCOUNTER — Encounter: Payer: Self-pay | Admitting: Oncology

## 2022-08-20 ENCOUNTER — Inpatient Hospital Stay: Payer: No Typology Code available for payment source

## 2022-08-23 ENCOUNTER — Encounter: Payer: Self-pay | Admitting: Oncology

## 2022-08-25 ENCOUNTER — Other Ambulatory Visit (HOSPITAL_COMMUNITY): Payer: Self-pay

## 2022-08-25 ENCOUNTER — Inpatient Hospital Stay: Payer: No Typology Code available for payment source

## 2022-08-25 VITALS — BP 129/79 | HR 91 | Temp 98.5°F | Resp 16

## 2022-08-25 DIAGNOSIS — D500 Iron deficiency anemia secondary to blood loss (chronic): Secondary | ICD-10-CM | POA: Diagnosis not present

## 2022-08-25 MED ORDER — SODIUM CHLORIDE 0.9 % IV SOLN: Freq: Once | INTRAVENOUS | Status: AC

## 2022-08-25 MED ORDER — SODIUM CHLORIDE 0.9 % IV SOLN
200.0000 mg | Freq: Once | INTRAVENOUS | Status: AC
  Administered 2022-08-25: 200 mg via INTRAVENOUS
  Filled 2022-08-25: qty 200

## 2022-08-25 NOTE — Progress Notes (Signed)
Pt declined to be observed for 30 minutes post Venofer infusion. Pt tolerated trtmt well w/out incident. VSS at discharge.  Ambulatory to lobby.   

## 2022-08-25 NOTE — Patient Instructions (Signed)

## 2022-08-28 ENCOUNTER — Other Ambulatory Visit (HOSPITAL_COMMUNITY): Payer: Self-pay

## 2022-08-29 MED FILL — Iron Sucrose Inj 20 MG/ML (Fe Equiv): INTRAVENOUS | Qty: 10 | Status: AC

## 2022-08-30 ENCOUNTER — Inpatient Hospital Stay: Payer: No Typology Code available for payment source

## 2022-09-02 ENCOUNTER — Encounter: Payer: Self-pay | Admitting: Internal Medicine

## 2022-09-04 ENCOUNTER — Other Ambulatory Visit (HOSPITAL_COMMUNITY): Payer: Self-pay

## 2022-09-04 MED ORDER — KETOCONAZOLE 2 % EX CREA
TOPICAL_CREAM | CUTANEOUS | 2 refills | Status: DC
  Filled 2022-09-04: qty 60, 30d supply, fill #0
  Filled 2022-12-04 – 2022-12-08 (×2): qty 60, 30d supply, fill #1

## 2022-09-04 MED ORDER — TRIAMCINOLONE ACETONIDE 0.1 % EX CREA
TOPICAL_CREAM | CUTANEOUS | 0 refills | Status: DC
  Filled 2022-09-04: qty 80, 30d supply, fill #0

## 2022-09-05 ENCOUNTER — Other Ambulatory Visit (HOSPITAL_COMMUNITY): Payer: Self-pay

## 2022-09-05 MED FILL — Iron Sucrose Inj 20 MG/ML (Fe Equiv): INTRAVENOUS | Qty: 10 | Status: AC

## 2022-09-06 ENCOUNTER — Inpatient Hospital Stay: Payer: No Typology Code available for payment source

## 2022-09-11 ENCOUNTER — Other Ambulatory Visit: Payer: Self-pay | Admitting: Internal Medicine

## 2022-09-12 ENCOUNTER — Other Ambulatory Visit (HOSPITAL_COMMUNITY): Payer: Self-pay

## 2022-09-12 MED ORDER — ALBUTEROL SULFATE HFA 108 (90 BASE) MCG/ACT IN AERS
1.0000 | INHALATION_SPRAY | Freq: Four times a day (QID) | RESPIRATORY_TRACT | 2 refills | Status: DC | PRN
  Filled 2022-09-12: qty 6.7, 25d supply, fill #0
  Filled 2022-12-04 – 2022-12-08 (×2): qty 6.7, 25d supply, fill #1

## 2022-09-13 ENCOUNTER — Other Ambulatory Visit (HOSPITAL_COMMUNITY): Payer: Self-pay

## 2022-09-15 ENCOUNTER — Encounter: Payer: Self-pay | Admitting: Oncology

## 2022-09-15 DIAGNOSIS — Z0189 Encounter for other specified special examinations: Secondary | ICD-10-CM | POA: Diagnosis not present

## 2022-09-16 ENCOUNTER — Encounter (HOSPITAL_BASED_OUTPATIENT_CLINIC_OR_DEPARTMENT_OTHER): Payer: Self-pay | Admitting: Obstetrics and Gynecology

## 2022-09-16 ENCOUNTER — Other Ambulatory Visit: Payer: Self-pay

## 2022-09-16 DIAGNOSIS — Z01812 Encounter for preprocedural laboratory examination: Secondary | ICD-10-CM | POA: Diagnosis not present

## 2022-09-17 NOTE — Progress Notes (Signed)
Your procedure is scheduled on Monday, 09/22/2022.  Report to [facility] AT 7:30  A. M.   Call this number if you have problems the morning of surgery  :[phone].   OUR ADDRESS IS [place].  WE ARE LOCATED IN [facility].  PLEASE BRING YOUR INSURANCE CARD AND PHOTO ID DAY OF SURGERY.  ONLY 2 PEOPLE ARE ALLOWED IN  WAITING  ROOM.                                      REMEMBER:  DO NOT EAT FOOD, CANDY GUM OR MINTS  AFTER MIDNIGHT THE NIGHT BEFORE YOUR SURGERY . YOU MAY HAVE CLEAR LIQUIDS FROM MIDNIGHT THE NIGHT BEFORE YOUR SURGERY UNTIL  6:30 AM. NO CLEAR LIQUIDS AFTER   6:30 AM DAY OF SURGERY.  YOU MAY  BRUSH YOUR TEETH MORNING OF SURGERY AND RINSE YOUR MOUTH OUT, NO CHEWING GUM CANDY OR MINTS.     CLEAR LIQUID DIET   Foods Allowed                                                                     Foods Excluded  Coffee and tea, regular and decaf                             liquids that you cannot  Plain Jell-O                                                                   see through such as: Fruit ices (not with fruit pulp)                                     milk, soups, orange juice  Plain  Popsicles                                    All solid food Carbonated beverages, regular and diet                                    Cranberry, grape and apple juices Sports drinks like Gatorade _____________________________________________________________________     TAKE ONLY THESE MEDICATIONS MORNING OF SURGERY: Albuterol inhaler if needed, Symbicort, Zyrtec, Bentyl if needed, Flonase, Zoloft  Please bring albuterol inhaler on day of surgery.    UP TO 4 VISITORS  MAY VISIT IN THE EXTENDED RECOVERY ROOM UNTIL 800 PM ONLY.  ONE  VISITOR AGE 37 AND OVER MAY SPEND THE NIGHT AND MUST BE IN EXTENDED RECOVERY ROOM NO LATER THAN 800 PM . YOUR DISCHARGE TIME AFTER YOU SPEND THE NIGHT IS 900 AM  THE MORNING AFTER YOUR SURGERY.  YOU MAY  PACK A SMALL OVERNIGHT BAG WITH TOILETRIES FOR YOUR OVERNIGHT STAY IF YOU WISH.  YOUR PRESCRIPTION MEDICATIONS WILL BE PROVIDED DURING [facility].                                      DO NOT WEAR JEWERLY, MAKE UP. DO NOT WEAR LOTIONS, POWDERS, PERFUMES OR NAIL POLISH ON YOUR FINGERNAILS. TOENAIL POLISH IS OK TO WEAR. DO NOT SHAVE FOR 48 HOURS PRIOR TO DAY OF SURGERY. MEN MAY SHAVE FACE AND NECK. CONTACTS, GLASSES, OR DENTURES MAY NOT BE WORN TO SURGERY.  REMEMBER: NO SMOKING, DRUGS OR ALCOHOL FOR 24 HOURS BEFORE YOUR SURGERY.                                    [facility] IS NOT RESPONSIBLE  FOR ANY BELONGINGS.                                                                    [name]           [facility] - Preparing for Surgery Before surgery, you can play an important role.  Because skin is not sterile, your skin needs to be as free of germs as possible.  You can reduce the number of germs on your skin by washing with CHG (chlorahexidine gluconate) soap before surgery.  CHG is an antiseptic cleaner which kills germs and bonds with the skin to continue killing germs even after washing. Please DO NOT use if you have an allergy to CHG or antibacterial soaps.  If your skin becomes reddened/irritated stop using the CHG and inform your nurse when you arrive at Short Stay. Do not shave (including legs and underarms) for at least 48 hours prior to the first CHG shower.  You may shave your face/neck. Please follow these instructions carefully:  1.  Shower with CHG Soap the night before surgery and the  morning of Surgery.  2.  If you choose to wash your hair, wash your hair first as usual with your  normal  shampoo.  3.  After you shampoo, rinse your hair and body thoroughly to remove the  shampoo.                                        4.  Use CHG as you would any other liquid soap.  You can apply chg directly  to the skin and wash , chg soap provided, night before and morning of your  surgery.  5.  Apply the CHG Soap to your body ONLY FROM THE NECK DOWN.   Do not use on face/ open                           Wound or open sores. Avoid contact with eyes, ears mouth and genitals (private parts).  Wash face,  Genitals (private parts) with your normal soap.             6.  Wash thoroughly, paying special attention to the area where your surgery  will be performed.  7.  Thoroughly rinse your body with warm water from the neck down.  8.  DO NOT shower/wash with your normal soap after using and rinsing off  the CHG Soap.             9.  Pat yourself dry with a clean towel.            10.  Wear clean pajamas.            11.  Place clean sheets on your bed the night of your first shower and do not  sleep with pets. Day of Surgery : Do not apply any lotions/deodorants the morning of surgery.  Please wear clean clothes to the hospital/surgery center.  IF YOU HAVE ANY SKIN IRRITATION OR PROBLEMS WITH THE SURGICAL SOAP, PLEASE GET A BAR OF GOLD DIAL SOAP AND SHOWER THE NIGHT BEFORE YOUR SURGERY AND THE MORNING OF YOUR SURGERY. PLEASE LET THE NURSE KNOW MORNING OF YOUR SURGERY IF YOU HAD ANY PROBLEMS WITH THE SURGICAL SOAP.   ________________________________________________________________________                                                        QUESTIONS [facility] PRE OP NURSE PHONE [phone].

## 2022-09-17 NOTE — Progress Notes (Addendum)
Spoke w/ via phone for pre-op interview---Deanna Schwartz needs dos---- urine pregnancy              Schwartz results------09/19/22 Schwartz appt for cbc, cmp. Type & screen, 02/18/22 EKG in Epic & chart, 06/04/22 Chest xray in Epic, 06/04/21 Echo in [facility] test -----patient states asymptomatic no test needed Arrive at -------0730 on Monday, 09/22/2022 NPO after MN NO Solid Food.  Clear liquids from MN until---0630 Med rec completed Medications to take morning of surgery -----Albuterol prn,Symbicort, Zyrtec, Bentyl prn, Flonase, Zoloft Diabetic medication -----n/a Patient instructed no nail polish to be worn day of surgery Patient instructed to bring photo id and insurance card day of surgery Patient aware to have Driver (ride ) / caregiver    for 24 hours after surgery - husband, Deanna Schwartz - [facility] Patient Special Instructions -----Bring inhaler. Extended / overnight stay instructions given. Pre-Op special Istructions -----none Patient verbalized understanding of instructions that were given at this phone interview. Patient denies shortness of breath, chest pain, fever, cough at this phone interview.

## 2022-09-19 ENCOUNTER — Inpatient Hospital Stay: Payer: 59 | Attending: Oncology

## 2022-09-19 ENCOUNTER — Other Ambulatory Visit: Payer: Self-pay

## 2022-09-19 ENCOUNTER — Encounter (HOSPITAL_COMMUNITY)
Admission: RE | Admit: 2022-09-19 | Discharge: 2022-09-19 | Disposition: A | Discharge status: Home / Self Care | Payer: 59 | Source: Ambulatory Visit | Attending: Obstetrics and Gynecology | Admitting: Obstetrics and Gynecology

## 2022-09-19 VITALS — BP 143/87 | HR 89 | Resp 16

## 2022-09-19 DIAGNOSIS — N920 Excessive and frequent menstruation with regular cycle: Secondary | ICD-10-CM | POA: Insufficient documentation

## 2022-09-19 DIAGNOSIS — Z01812 Encounter for preprocedural laboratory examination: Secondary | ICD-10-CM | POA: Diagnosis not present

## 2022-09-19 DIAGNOSIS — Z01818 Encounter for other preprocedural examination: Secondary | ICD-10-CM

## 2022-09-19 DIAGNOSIS — D500 Iron deficiency anemia secondary to blood loss (chronic): Secondary | ICD-10-CM | POA: Insufficient documentation

## 2022-09-19 LAB — CBC
HCT: 39.5 % (ref 36.0–46.0)
Hemoglobin: 12.7 g/dL (ref 12.0–15.0)
MCH: 29.3 pg (ref 26.0–34.0)
MCHC: 32.2 g/dL (ref 30.0–36.0)
MCV: 91 fL (ref 80.0–100.0)
Platelets: 201 10*3/uL (ref 150–400)
RBC: 4.34 MIL/uL (ref 3.87–5.11)
RDW: 13.2 % (ref 11.5–15.5)
WBC: 6.2 10*3/uL (ref 4.0–10.5)
nRBC: 0 % (ref 0.0–0.2)

## 2022-09-19 LAB — COMPREHENSIVE METABOLIC PANEL
ALT: 21 U/L (ref 0–44)
AST: 21 U/L (ref 15–41)
Albumin: 3.6 g/dL (ref 3.5–5.0)
Alkaline Phosphatase: 55 U/L (ref 38–126)
Anion gap: 6 (ref 5–15)
BUN: 13 mg/dL (ref 6–20)
CO2: 26 mmol/L (ref 22–32)
Calcium: 9.1 mg/dL (ref 8.9–10.3)
Chloride: 106 mmol/L (ref 98–111)
Creatinine, Ser: 0.8 mg/dL (ref 0.44–1.00)
GFR, Estimated: >60 mL/min (ref >60)
Glucose, Bld: 83 mg/dL (ref 70–99)
Potassium: 4.3 mmol/L (ref 3.5–5.1)
Sodium: 138 mmol/L (ref 135–145)
Total Bilirubin: 0.2 mg/dL — ABNORMAL LOW (ref 0.3–1.2)
Total Protein: 7.2 g/dL (ref 6.5–8.1)

## 2022-09-19 MED ORDER — SODIUM CHLORIDE 0.9 % IV SOLN
200.0000 mg | Freq: Once | INTRAVENOUS | Status: AC
  Administered 2022-09-19: 200 mg via INTRAVENOUS
  Filled 2022-09-19: qty 200

## 2022-09-19 MED ORDER — SODIUM CHLORIDE 0.9 % IV SOLN: Freq: Once | INTRAVENOUS | Status: AC

## 2022-09-19 NOTE — Patient Instructions (Signed)
Iron Sucrose Injection What is this medication? IRON SUCROSE (EYE ern SOO krose) treats low levels of iron (iron deficiency anemia) in people with kidney disease. Iron is a mineral that plays an important role in making red blood cells, which carry oxygen from your lungs to the rest of your body. This medicine may be used for other purposes; ask your health care provider or pharmacist if you have questions. COMMON BRAND NAME(S): Venofer What should I tell my care team before I take this medication? They need to know if you have any of these conditions: Anemia not caused by low iron levels Heart disease High levels of iron in the blood Kidney disease Liver disease An unusual or allergic reaction to iron, other medications, foods, dyes, or preservatives Pregnant or trying to get pregnant Breastfeeding How should I use this medication? This medication is for infusion into a vein. It is given in a hospital or clinic setting. Talk to your care team about the use of this medication in children. While this medication may be prescribed for children as young as 2 years for selected conditions, precautions do apply. Overdosage: If you think you have taken too much of this medicine contact a poison control center or emergency room at once. NOTE: This medicine is only for you. Do not share this medicine with others. What if I miss a dose? Keep appointments for follow-up doses. It is important not to miss your dose. Call your care team if you are unable to keep an appointment. What may interact with this medication? Do not take this medication with any of the following: Deferoxamine Dimercaprol Other iron products This medication may also interact with the following: Chloramphenicol Deferasirox This list may not describe all possible interactions. Give your health care provider a list of all the medicines, herbs, non-prescription drugs, or dietary supplements you use. Also tell them if you smoke,  drink alcohol, or use illegal drugs. Some items may interact with your medicine. What should I watch for while using this medication? Visit your care team regularly. Tell your care team if your symptoms do not start to get better or if they get worse. You may need blood work done while you are taking this medication. You may need to follow a special diet. Talk to your care team. Foods that contain iron include: whole grains/cereals, dried fruits, beans, or peas, leafy green vegetables, and organ meats (liver, kidney). What side effects may I notice from receiving this medication? Side effects that you should report to your care team as soon as possible: Allergic reactions--skin rash, itching, hives, swelling of the face, lips, tongue, or throat Low blood pressure--dizziness, feeling faint or lightheaded, blurry vision Shortness of breath Side effects that usually do not require medical attention (report to your care team if they continue or are bothersome): Flushing Headache Joint pain Muscle pain Nausea Pain, redness, or irritation at injection site This list may not describe all possible side effects. Call your doctor for medical advice about side effects. You may report side effects to FDA at 1-800-FDA-1088. Where should I keep my medication? This medication is given in a hospital or clinic and will not be stored at home. NOTE: This sheet is a summary. It may not cover all possible information. If you have questions about this medicine, talk to your doctor, pharmacist, or health care provider.  2023 Elsevier/Gold Standard (2020-12-06 00:00:00)

## 2022-09-20 NOTE — H&P (Signed)
Deanna Schwartz is an 42 y.o. female [phone] with menorrhagia to anemia for robot assisted total laparoscopic hysterectomy with B salpingectomy, possible cystoscopy.  Pt's morbid obesity complicates procedure.  She has gotten multiple iron infusion and takes oral iron.    Pertinent Gynecological History: [id number] SVD x 3 7-8#  No STD,  no abn pap, last 05/2021 HR HPV neg, WNL Menstrual History:  Patient's last menstrual period was 09/13/2022 (exact date).    Past Medical History:  Diagnosis Date   Allergic rhinitis    Anxiety    improved per pt on 09/14/22   Asthma    Follows w/ [facility], Dr. Orvil Feil. Patient states last asthma exacerbation was in early December 2023. States she is breathing well as of 09/16/22.   B12 deficiency with anti-parietal cell antibodies + 03/25/2011   New 03/2011 , resolved per pt on 09/16/22   Chronic constipation    resolved per pt on 09/2022   Depression    resolved per pt on 09/16/22   GERD (gastroesophageal reflux disease)    improved as of 09/16/22 per pt   H. pylori infection 2008   Hx of tx + serology   IBS (irritable bowel syndrome)    Iron deficiency anemia 08/06/2011   Hx of multiple iron infusions, most recent in 08/2022, per pt she is scheduled for an infusion on 09/19/22   Irritable bowel syndrome    Knee pain    Obesity    Follows with [facility].   Palpitations 04/2021   Echo on 06/04/21 EF 55 -60%   Panic attack    Prediabetes    many years ago per pt, labs improved as of 2024   Shortness of breath    w/ asthma exacerbations   Ulcer    Vitamin D deficiency    resolved per pt 2024  To start wegovy  Past Surgical History:  Procedure Laterality Date   FLEXIBLE SIGMOIDOSCOPY  04/14/2008   normal   NASAL SINUS SURGERY  2012   UPPER GASTROINTESTINAL ENDOSCOPY     around 2014    Family History  Problem Relation Age of Onset   Hyperlipidemia Mother    Hypertension Mother    Anxiety disorder Mother     Obesity Mother    Cancer Father    Hyperlipidemia Father    Hypertension Father    Diabetes Paternal Grandmother    Diabetes Maternal Aunt    Diabetes Maternal Aunt    Colon polyps Paternal Uncle        x4   Diabetes Other        grandmother   Hypertension Other    Colon cancer Neg Hx     Social History:  reports that she has never smoked. She has never used smokeless tobacco. She reports current alcohol use of about 2.0 standard drinks of alcohol per week. She reports that she does not use drugs. Married, works at [employer]:  Allergies  Allergen Reactions   Augmentin [Amoxicillin-Pot Clavulanate] Rash   Cephalexin Rash   Iodine Swelling and Rash    Throat swelling after eating shrimp. Patient states that she is no longer allergic to iodine or shrimp as of 09/16/22.    Meds: albuterol, clinda gel, iron, montelukast, MVI, triamcinolone, sertraline, symbicort, Vit D  Review of Systems  Constitutional: Negative.   HENT: Negative.    Respiratory: Negative.    Cardiovascular: Negative.   Gastrointestinal: Negative.   Genitourinary:  Positive  for menstrual problem and pelvic pain.  Musculoskeletal: Negative.   Skin: Negative.   Neurological: Negative.   Psychiatric/Behavioral: Negative.      Weight 113.4 kg, last menstrual period 09/13/2022. Physical Exam Constitutional:      Appearance: Normal appearance. She is obese.  HENT:     Head: Normocephalic and atraumatic.  Cardiovascular:     Rate and Rhythm: Normal rate and regular rhythm.  Pulmonary:     Effort: Pulmonary effort is normal.     Breath sounds: Normal breath sounds.  Abdominal:     General: Bowel sounds are normal.     Palpations: Abdomen is soft.  Genitourinary:    General: Normal vulva.     Rectum: Normal.  Musculoskeletal:        General: Normal range of motion.     Cervical back: Normal range of motion and neck supple.  Skin:    General: Skin is warm and dry.  Neurological:      General: No focal deficit present.     Mental Status: She is alert.  Psychiatric:        Mood and Affect: Mood normal.        Behavior: Behavior normal.     Assessment/Plan: 42yo [id number] with menorrhagia to anemia for RA TLH/BS, poss cystscopy Will proceed   Myranda Pavone Bovard-Stuckert 09/20/2022, 9:16 PM

## 2022-09-22 ENCOUNTER — Other Ambulatory Visit: Payer: Self-pay

## 2022-09-22 ENCOUNTER — Ambulatory Visit (HOSPITAL_BASED_OUTPATIENT_CLINIC_OR_DEPARTMENT_OTHER)
Admission: RE | Admit: 2022-09-22 | Discharge: 2022-09-22 | Disposition: A | Discharge status: Home / Self Care | Payer: 59 | Attending: Obstetrics and Gynecology | Admitting: Obstetrics and Gynecology

## 2022-09-22 ENCOUNTER — Encounter (HOSPITAL_BASED_OUTPATIENT_CLINIC_OR_DEPARTMENT_OTHER): Payer: Self-pay | Admitting: Obstetrics and Gynecology

## 2022-09-22 ENCOUNTER — Ambulatory Visit (HOSPITAL_BASED_OUTPATIENT_CLINIC_OR_DEPARTMENT_OTHER): Payer: 59

## 2022-09-22 ENCOUNTER — Other Ambulatory Visit (HOSPITAL_COMMUNITY): Payer: Self-pay

## 2022-09-22 ENCOUNTER — Encounter (HOSPITAL_BASED_OUTPATIENT_CLINIC_OR_DEPARTMENT_OTHER)
Admission: RE | Disposition: A | Discharge status: Home / Self Care | Payer: Self-pay | Source: Home / Self Care | Attending: Obstetrics and Gynecology

## 2022-09-22 DIAGNOSIS — D251 Intramural leiomyoma of uterus: Secondary | ICD-10-CM | POA: Diagnosis not present

## 2022-09-22 DIAGNOSIS — D500 Iron deficiency anemia secondary to blood loss (chronic): Secondary | ICD-10-CM

## 2022-09-22 DIAGNOSIS — Z6841 Body Mass Index (BMI) 40.0 and over, adult: Secondary | ICD-10-CM | POA: Insufficient documentation

## 2022-09-22 DIAGNOSIS — Z90710 Acquired absence of both cervix and uterus: Secondary | ICD-10-CM | POA: Diagnosis present

## 2022-09-22 DIAGNOSIS — F418 Other specified anxiety disorders: Secondary | ICD-10-CM | POA: Diagnosis not present

## 2022-09-22 DIAGNOSIS — D259 Leiomyoma of uterus, unspecified: Secondary | ICD-10-CM | POA: Diagnosis not present

## 2022-09-22 DIAGNOSIS — Z9889 Other specified postprocedural states: Secondary | ICD-10-CM

## 2022-09-22 DIAGNOSIS — K219 Gastro-esophageal reflux disease without esophagitis: Secondary | ICD-10-CM | POA: Diagnosis not present

## 2022-09-22 DIAGNOSIS — D649 Anemia, unspecified: Secondary | ICD-10-CM | POA: Diagnosis not present

## 2022-09-22 DIAGNOSIS — N920 Excessive and frequent menstruation with regular cycle: Secondary | ICD-10-CM | POA: Insufficient documentation

## 2022-09-22 DIAGNOSIS — N840 Polyp of corpus uteri: Secondary | ICD-10-CM | POA: Diagnosis not present

## 2022-09-22 DIAGNOSIS — J45909 Unspecified asthma, uncomplicated: Secondary | ICD-10-CM | POA: Diagnosis not present

## 2022-09-22 DIAGNOSIS — R7303 Prediabetes: Secondary | ICD-10-CM | POA: Insufficient documentation

## 2022-09-22 DIAGNOSIS — Z01818 Encounter for other preprocedural examination: Secondary | ICD-10-CM

## 2022-09-22 HISTORY — PX: CYSTOSCOPY: SHX5120

## 2022-09-22 HISTORY — PX: ROBOTIC ASSISTED LAPAROSCOPIC HYSTERECTOMY AND SALPINGECTOMY: SHX6379

## 2022-09-22 LAB — CBC
HCT: 40.5 % (ref 36.0–46.0)
Hemoglobin: 12.9 g/dL (ref 12.0–15.0)
MCH: 29.4 pg (ref 26.0–34.0)
MCHC: 31.9 g/dL (ref 30.0–36.0)
MCV: 92.3 fL (ref 80.0–100.0)
Platelets: 225 10*3/uL (ref 150–400)
RBC: 4.39 MIL/uL (ref 3.87–5.11)
RDW: 13.2 % (ref 11.5–15.5)
WBC: 12.4 10*3/uL — ABNORMAL HIGH (ref 4.0–10.5)
nRBC: 0 % (ref 0.0–0.2)

## 2022-09-22 LAB — BASIC METABOLIC PANEL
Anion gap: 9 (ref 5–15)
BUN: 13 mg/dL (ref 6–20)
CO2: 26 mmol/L (ref 22–32)
Calcium: 8.5 mg/dL — ABNORMAL LOW (ref 8.9–10.3)
Chloride: 101 mmol/L (ref 98–111)
Creatinine, Ser: 0.83 mg/dL (ref 0.44–1.00)
GFR, Estimated: >60 mL/min (ref >60)
Glucose, Bld: 128 mg/dL — ABNORMAL HIGH (ref 70–99)
Potassium: 4.3 mmol/L (ref 3.5–5.1)
Sodium: 136 mmol/L (ref 135–145)

## 2022-09-22 LAB — TYPE AND SCREEN
ABO/RH(D): B POS
Antibody Screen: NEGATIVE

## 2022-09-22 LAB — POCT PREGNANCY, URINE: Preg Test, Ur: NEGATIVE

## 2022-09-22 LAB — ABO/RH: ABO/RH(D): B POS

## 2022-09-22 SURGERY — XI ROBOTIC ASSISTED LAPAROSCOPIC HYSTERECTOMY AND SALPINGECTOMY
Anesthesia: General | Site: Urethra

## 2022-09-22 MED ORDER — ONDANSETRON HCL 4 MG/2ML IJ SOLN: 4.0000 mg | Freq: Four times a day (QID) | INTRAMUSCULAR | Status: DC | PRN

## 2022-09-22 MED ORDER — BUPIVACAINE HCL (PF) 0.25 % IJ SOLN
INTRAMUSCULAR | Status: DC | PRN
  Administered 2022-09-22: 12 mL

## 2022-09-22 MED ORDER — KETOROLAC TROMETHAMINE 30 MG/ML IJ SOLN
INTRAMUSCULAR | Status: DC | PRN
  Administered 2022-09-22: 30 mg via INTRAVENOUS

## 2022-09-22 MED ORDER — ROCURONIUM BROMIDE 10 MG/ML (PF) SYRINGE
PREFILLED_SYRINGE | INTRAVENOUS | Status: AC
  Filled 2022-09-22: qty 20

## 2022-09-22 MED ORDER — ALBUTEROL SULFATE HFA 108 (90 BASE) MCG/ACT IN AERS: 1.0000 | INHALATION_SPRAY | Freq: Four times a day (QID) | RESPIRATORY_TRACT | Status: DC | PRN

## 2022-09-22 MED ORDER — PHENYLEPHRINE 80 MCG/ML (10ML) SYRINGE FOR IV PUSH (FOR BLOOD PRESSURE SUPPORT)
PREFILLED_SYRINGE | INTRAVENOUS | Status: DC | PRN
  Administered 2022-09-22 (×3): 80 ug via INTRAVENOUS

## 2022-09-22 MED ORDER — ESMOLOL HCL 100 MG/10ML IV SOLN
INTRAVENOUS | Status: DC | PRN
  Administered 2022-09-22: 10 mg via INTRAVENOUS
  Administered 2022-09-22 (×2): 20 mg via INTRAVENOUS

## 2022-09-22 MED ORDER — MIDAZOLAM HCL 2 MG/2ML IJ SOLN
INTRAMUSCULAR | Status: DC | PRN
  Administered 2022-09-22: 2 mg via INTRAVENOUS

## 2022-09-22 MED ORDER — ALBUTEROL SULFATE (2.5 MG/3ML) 0.083% IN NEBU
INHALATION_SOLUTION | RESPIRATORY_TRACT | Status: AC
  Filled 2022-09-22: qty 3

## 2022-09-22 MED ORDER — IBUPROFEN 800 MG PO TABS
800.0000 mg | ORAL_TABLET | Freq: Three times a day (TID) | ORAL | 1 refills | Status: DC | PRN
  Filled 2022-09-22: qty 45, 15d supply, fill #0

## 2022-09-22 MED ORDER — HYDROMORPHONE HCL 1 MG/ML IJ SOLN: 0.2000 mg | INTRAMUSCULAR | Status: DC | PRN

## 2022-09-22 MED ORDER — DEXMEDETOMIDINE HCL IN NACL 80 MCG/20ML IV SOLN
INTRAVENOUS | Status: AC
  Filled 2022-09-22: qty 20

## 2022-09-22 MED ORDER — CLINDAMYCIN PHOSPHATE 900 MG/50ML IV SOLN
900.0000 mg | Freq: Once | INTRAVENOUS | Status: AC
  Administered 2022-09-22: 900 mg via INTRAVENOUS

## 2022-09-22 MED ORDER — FENTANYL CITRATE (PF) 100 MCG/2ML IJ SOLN: 25.0000 ug | INTRAMUSCULAR | Status: DC | PRN

## 2022-09-22 MED ORDER — HYDROMORPHONE HCL 1 MG/ML IJ SOLN
INTRAMUSCULAR | Status: DC | PRN
  Administered 2022-09-22: .5 mg via INTRAVENOUS

## 2022-09-22 MED ORDER — MONTELUKAST SODIUM 10 MG PO TABS: 10.0000 mg | ORAL_TABLET | Freq: Every day | ORAL | Status: DC

## 2022-09-22 MED ORDER — LACTATED RINGERS IV SOLN: INTRAVENOUS | Status: DC

## 2022-09-22 MED ORDER — GUAIFENESIN 100 MG/5ML PO LIQD: 15.0000 mL | ORAL | Status: DC | PRN

## 2022-09-22 MED ORDER — LIDOCAINE 2% (20 MG/ML) 5 ML SYRINGE
INTRAMUSCULAR | Status: DC | PRN
  Administered 2022-09-22: 60 mg via INTRAVENOUS

## 2022-09-22 MED ORDER — ACETAMINOPHEN 10 MG/ML IV SOLN: 1000.0000 mg | Freq: Once | INTRAVENOUS | Status: DC | PRN

## 2022-09-22 MED ORDER — ROCURONIUM BROMIDE 10 MG/ML (PF) SYRINGE
PREFILLED_SYRINGE | INTRAVENOUS | Status: DC | PRN
  Administered 2022-09-22: 50 mg via INTRAVENOUS
  Administered 2022-09-22: 20 mg via INTRAVENOUS
  Administered 2022-09-22: 10 mg via INTRAVENOUS
  Administered 2022-09-22: 20 mg via INTRAVENOUS

## 2022-09-22 MED ORDER — GENTAMICIN SULFATE 40 MG/ML IJ SOLN
1.5000 mg/kg | Freq: Once | INTRAVENOUS | Status: AC
  Administered 2022-09-22: 170 mg via INTRAVENOUS
  Filled 2022-09-22: qty 4.25

## 2022-09-22 MED ORDER — GABAPENTIN 300 MG PO CAPS
300.0000 mg | ORAL_CAPSULE | ORAL | Status: AC
  Administered 2022-09-22: 300 mg via ORAL

## 2022-09-22 MED ORDER — OXYCODONE-ACETAMINOPHEN 5-325 MG PO TABS
ORAL_TABLET | ORAL | Status: AC
  Filled 2022-09-22: qty 2

## 2022-09-22 MED ORDER — POVIDONE-IODINE 10 % EX SWAB: 2.0000 | Freq: Once | CUTANEOUS | Status: DC

## 2022-09-22 MED ORDER — ONDANSETRON HCL 4 MG PO TABS: 4.0000 mg | ORAL_TABLET | Freq: Four times a day (QID) | ORAL | Status: DC | PRN

## 2022-09-22 MED ORDER — ONDANSETRON HCL 4 MG/2ML IJ SOLN
INTRAMUSCULAR | Status: AC
  Filled 2022-09-22: qty 4

## 2022-09-22 MED ORDER — DIPHENHYDRAMINE HCL 50 MG/ML IJ SOLN
INTRAMUSCULAR | Status: AC
  Filled 2022-09-22: qty 1

## 2022-09-22 MED ORDER — METOPROLOL TARTRATE 5 MG/5ML IV SOLN
INTRAVENOUS | Status: AC
  Filled 2022-09-22: qty 5

## 2022-09-22 MED ORDER — ACETAMINOPHEN 500 MG PO TABS
ORAL_TABLET | ORAL | Status: AC
  Filled 2022-09-22: qty 2

## 2022-09-22 MED ORDER — DEXAMETHASONE SODIUM PHOSPHATE 10 MG/ML IJ SOLN
INTRAMUSCULAR | Status: DC | PRN
  Administered 2022-09-22: 10 mg via INTRAVENOUS

## 2022-09-22 MED ORDER — LORATADINE 10 MG PO TABS
ORAL_TABLET | ORAL | Status: AC
  Filled 2022-09-22: qty 1

## 2022-09-22 MED ORDER — ONDANSETRON HCL 4 MG/2ML IJ SOLN
INTRAMUSCULAR | Status: DC | PRN
  Administered 2022-09-22: 4 mg via INTRAVENOUS

## 2022-09-22 MED ORDER — ALUM & MAG HYDROXIDE-SIMETH 200-200-20 MG/5ML PO SUSP: 30.0000 mL | ORAL | Status: DC | PRN

## 2022-09-22 MED ORDER — ALBUTEROL SULFATE (2.5 MG/3ML) 0.083% IN NEBU
2.5000 mg | INHALATION_SOLUTION | Freq: Once | RESPIRATORY_TRACT | Status: AC
  Administered 2022-09-22: 2.5 mg via RESPIRATORY_TRACT

## 2022-09-22 MED ORDER — FENTANYL CITRATE (PF) 100 MCG/2ML IJ SOLN
INTRAMUSCULAR | Status: AC
  Filled 2022-09-22: qty 2

## 2022-09-22 MED ORDER — MENTHOL 3 MG MT LOZG: 1.0000 | LOZENGE | OROMUCOSAL | Status: DC | PRN

## 2022-09-22 MED ORDER — PROPOFOL 10 MG/ML IV BOLUS
INTRAVENOUS | Status: DC | PRN
  Administered 2022-09-22: 50 mg via INTRAVENOUS
  Administered 2022-09-22: 200 mg via INTRAVENOUS

## 2022-09-22 MED ORDER — GENTAMICIN SULFATE 40 MG/ML IJ SOLN: 1.5000 mg/kg | Freq: Once | INTRAVENOUS | Status: DC

## 2022-09-22 MED ORDER — ACETAMINOPHEN 500 MG PO TABS
1000.0000 mg | ORAL_TABLET | ORAL | Status: AC
  Administered 2022-09-22: 1000 mg via ORAL

## 2022-09-22 MED ORDER — OXYCODONE HCL 5 MG/5ML PO SOLN: 5.0000 mg | Freq: Once | ORAL | Status: DC | PRN

## 2022-09-22 MED ORDER — OXYCODONE-ACETAMINOPHEN 5-325 MG PO TABS
1.0000 | ORAL_TABLET | ORAL | Status: DC | PRN
  Administered 2022-09-22: 2 via ORAL

## 2022-09-22 MED ORDER — HYDROMORPHONE HCL 2 MG/ML IJ SOLN
INTRAMUSCULAR | Status: AC
  Filled 2022-09-22: qty 1

## 2022-09-22 MED ORDER — KETOROLAC TROMETHAMINE 30 MG/ML IJ SOLN
INTRAMUSCULAR | Status: AC
  Filled 2022-09-22: qty 1

## 2022-09-22 MED ORDER — OXYCODONE-ACETAMINOPHEN 5-325 MG PO TABS
1.0000 | ORAL_TABLET | Freq: Four times a day (QID) | ORAL | 0 refills | Status: DC | PRN
  Filled 2022-09-22: qty 20, 3d supply, fill #0

## 2022-09-22 MED ORDER — SUGAMMADEX SODIUM 200 MG/2ML IV SOLN
INTRAVENOUS | Status: DC | PRN
  Administered 2022-09-22: 250 mg via INTRAVENOUS

## 2022-09-22 MED ORDER — SIMETHICONE 80 MG PO CHEW: 80.0000 mg | CHEWABLE_TABLET | Freq: Four times a day (QID) | ORAL | Status: DC | PRN

## 2022-09-22 MED ORDER — ESMOLOL HCL 100 MG/10ML IV SOLN
INTRAVENOUS | Status: AC
  Filled 2022-09-22: qty 10

## 2022-09-22 MED ORDER — EPINEPHRINE 0.3 MG/0.3ML IJ SOAJ: 0.3000 mg | INTRAMUSCULAR | Status: DC | PRN

## 2022-09-22 MED ORDER — CLINDAMYCIN PHOSPHATE 900 MG/50ML IV SOLN
INTRAVENOUS | Status: AC
  Filled 2022-09-22: qty 50

## 2022-09-22 MED ORDER — 0.9 % SODIUM CHLORIDE (POUR BTL) OPTIME
TOPICAL | Status: DC | PRN
  Administered 2022-09-22: 500 mL

## 2022-09-22 MED ORDER — FLUTICASONE PROPIONATE 50 MCG/ACT NA SUSP: 1.0000 | Freq: Every day | NASAL | Status: DC

## 2022-09-22 MED ORDER — LIDOCAINE HCL (PF) 2 % IJ SOLN
INTRAMUSCULAR | Status: AC
  Filled 2022-09-22: qty 10

## 2022-09-22 MED ORDER — LORATADINE 10 MG PO TABS
10.0000 mg | ORAL_TABLET | Freq: Every day | ORAL | Status: DC
  Administered 2022-09-22: 10 mg via ORAL

## 2022-09-22 MED ORDER — SERTRALINE HCL 25 MG PO TABS: 25.0000 mg | ORAL_TABLET | Freq: Every day | ORAL | Status: DC

## 2022-09-22 MED ORDER — DEXAMETHASONE SODIUM PHOSPHATE 10 MG/ML IJ SOLN
INTRAMUSCULAR | Status: AC
  Filled 2022-09-22: qty 2

## 2022-09-22 MED ORDER — DEXMEDETOMIDINE HCL IN NACL 80 MCG/20ML IV SOLN
INTRAVENOUS | Status: DC | PRN
  Administered 2022-09-22: 8 ug via BUCCAL
  Administered 2022-09-22 (×2): 4 ug via BUCCAL

## 2022-09-22 MED ORDER — FENTANYL CITRATE (PF) 250 MCG/5ML IJ SOLN
INTRAMUSCULAR | Status: DC | PRN
  Administered 2022-09-22: 25 ug via INTRAVENOUS
  Administered 2022-09-22 (×3): 50 ug via INTRAVENOUS
  Administered 2022-09-22: 25 ug via INTRAVENOUS
  Administered 2022-09-22 (×2): 50 ug via INTRAVENOUS
  Administered 2022-09-22 (×2): 25 ug via INTRAVENOUS

## 2022-09-22 MED ORDER — NALOXONE HCL 0.4 MG/ML IJ SOLN: 0.4000 mg | INTRAMUSCULAR | Status: DC | PRN

## 2022-09-22 MED ORDER — DIPHENHYDRAMINE HCL 12.5 MG/5ML PO ELIX: 12.5000 mg | ORAL_SOLUTION | Freq: Four times a day (QID) | ORAL | Status: DC | PRN

## 2022-09-22 MED ORDER — GABAPENTIN 300 MG PO CAPS
ORAL_CAPSULE | ORAL | Status: AC
  Filled 2022-09-22: qty 1

## 2022-09-22 MED ORDER — PHENYLEPHRINE 80 MCG/ML (10ML) SYRINGE FOR IV PUSH (FOR BLOOD PRESSURE SUPPORT): PREFILLED_SYRINGE | INTRAVENOUS | Status: DC | PRN

## 2022-09-22 MED ORDER — FLUTICASONE FUROATE-VILANTEROL 200-25 MCG/ACT IN AEPB: 1.0000 | INHALATION_SPRAY | Freq: Every day | RESPIRATORY_TRACT | Status: DC

## 2022-09-22 MED ORDER — HYDROMORPHONE 1 MG/ML IV SOLN: INTRAVENOUS | Status: DC

## 2022-09-22 MED ORDER — SODIUM CHLORIDE 0.9% FLUSH: 9.0000 mL | INTRAVENOUS | Status: DC | PRN

## 2022-09-22 MED ORDER — OXYCODONE HCL 5 MG PO TABS: 5.0000 mg | ORAL_TABLET | Freq: Once | ORAL | Status: DC | PRN

## 2022-09-22 MED ORDER — SODIUM CHLORIDE 0.9 % IR SOLN
Status: DC | PRN
  Administered 2022-09-22: 1000 mL

## 2022-09-22 MED ORDER — PROMETHAZINE HCL 25 MG/ML IJ SOLN: 6.2500 mg | INTRAMUSCULAR | Status: DC | PRN

## 2022-09-22 MED ORDER — DIPHENHYDRAMINE HCL 50 MG/ML IJ SOLN: 12.5000 mg | Freq: Four times a day (QID) | INTRAMUSCULAR | Status: DC | PRN

## 2022-09-22 MED ORDER — FENTANYL CITRATE (PF) 250 MCG/5ML IJ SOLN
INTRAMUSCULAR | Status: AC
  Filled 2022-09-22: qty 5

## 2022-09-22 MED ORDER — IBUPROFEN 800 MG PO TABS: 800.0000 mg | ORAL_TABLET | Freq: Three times a day (TID) | ORAL | Status: DC | PRN

## 2022-09-22 MED ORDER — EPHEDRINE 5 MG/ML INJ
INTRAVENOUS | Status: AC
  Filled 2022-09-22: qty 5

## 2022-09-22 MED ORDER — MIDAZOLAM HCL 2 MG/2ML IJ SOLN
INTRAMUSCULAR | Status: AC
  Filled 2022-09-22: qty 2

## 2022-09-22 MED ORDER — KETOROLAC TROMETHAMINE 30 MG/ML IJ SOLN
INTRAMUSCULAR | Status: AC
  Filled 2022-09-22: qty 2

## 2022-09-22 SURGICAL SUPPLY — 63 items
ADH SKN CLS APL DERMABOND .7 (GAUZE/BANDAGES/DRESSINGS) ×2
APL PRP STRL LF DISP 70% ISPRP (MISCELLANEOUS) ×2
APL SRG 38 LTWT LNG FL B (MISCELLANEOUS) ×2
APPLICATOR ARISTA FLEXITIP XL (MISCELLANEOUS) IMPLANT
CATH FOLEY 3WAY  5CC 16FR (CATHETERS) ×2
CATH FOLEY 3WAY 5CC 16FR (CATHETERS) ×3 IMPLANT
CHLORAPREP W/TINT 26 (MISCELLANEOUS) IMPLANT
COVER BACK TABLE 60X90IN (DRAPES) ×3 IMPLANT
COVER TIP SHEARS 8 DVNC (MISCELLANEOUS) ×3 IMPLANT
COVER TIP SHEARS 8MM DA VINCI (MISCELLANEOUS) ×2
DEFOGGER SCOPE WARMER CLEARIFY (MISCELLANEOUS) ×3 IMPLANT
DERMABOND ADVANCED .7 DNX12 (GAUZE/BANDAGES/DRESSINGS) ×3 IMPLANT
DILATOR CANAL MILEX (MISCELLANEOUS) IMPLANT
DRAPE ARM DVNC X/XI (DISPOSABLE) ×12 IMPLANT
DRAPE COLUMN DVNC XI (DISPOSABLE) ×3 IMPLANT
DRAPE DA VINCI XI ARM (DISPOSABLE) ×8
DRAPE DA VINCI XI COLUMN (DISPOSABLE) ×2
DRAPE SURG IRRIG POUCH 19X23 (DRAPES) ×3 IMPLANT
DRAPE UTILITY 15X26 TOWEL STRL (DRAPES) ×3 IMPLANT
DURAPREP 26ML APPLICATOR (WOUND CARE) ×3 IMPLANT
GLOVE BIO SURGEON STRL SZ 6.5 (GLOVE) ×9 IMPLANT
GLOVE BIO SURGEON STRL SZ7 (GLOVE) IMPLANT
GLOVE BIOGEL PI IND STRL 6.5 (GLOVE) IMPLANT
GLOVE BIOGEL PI IND STRL 7.0 (GLOVE) IMPLANT
HEMOSTAT ARISTA ABSORB 3G PWDR (HEMOSTASIS) IMPLANT
HOLDER FOLEY CATH W/STRAP (MISCELLANEOUS) IMPLANT
IRRIG SUCT STRYKERFLOW 2 WTIP (MISCELLANEOUS) ×2
IRRIGATION SUCT STRKRFLW 2 WTP (MISCELLANEOUS) ×3 IMPLANT
IV NS 1000ML (IV SOLUTION) ×2
IV NS 1000ML BAXH (IV SOLUTION) IMPLANT
KIT PINK PAD W/HEAD ARE REST (MISCELLANEOUS) ×2 IMPLANT
KIT PINK PAD W/HEAD ARM REST (MISCELLANEOUS) ×3 IMPLANT
KIT TURNOVER CYSTO (KITS) ×3 IMPLANT
LEGGING LITHOTOMY PAIR STRL (DRAPES) ×3 IMPLANT
MANIFOLD NEPTUNE II (INSTRUMENTS) ×3 IMPLANT
MANIPULATOR ADVINCU DEL 3.0 PL (MISCELLANEOUS) IMPLANT
NDL INSUFFLATION 14GA 120MM (NEEDLE) ×3 IMPLANT
NEEDLE INSUFFLATION 14GA 120MM (NEEDLE) ×2 IMPLANT
NS IRRIG 500ML POUR BTL (IV SOLUTION) IMPLANT
OBTURATOR OPTICAL STANDARD 8MM (TROCAR) ×2
OBTURATOR OPTICAL STND 8 DVNC (TROCAR) ×2
OBTURATOR OPTICALSTD 8 DVNC (TROCAR) ×3 IMPLANT
OCCLUDER COLPOPNEUMO (BALLOONS) ×3 IMPLANT
PACK ROBOT WH (CUSTOM PROCEDURE TRAY) ×3 IMPLANT
PACK ROBOTIC GOWN (GOWN DISPOSABLE) ×3 IMPLANT
PAD OB MATERNITY 4.3X12.25 (PERSONAL CARE ITEMS) ×3 IMPLANT
PAD PREP 24X48 CUFFED NSTRL (MISCELLANEOUS) ×3 IMPLANT
SEAL CANN UNIV 5-8 DVNC XI (MISCELLANEOUS) ×9 IMPLANT
SEAL XI 5MM-8MM UNIVERSAL (MISCELLANEOUS) ×6
SEALER VESSEL DA VINCI XI (MISCELLANEOUS) ×2
SEALER VESSEL EXT DVNC XI (MISCELLANEOUS) IMPLANT
SET IRRIG Y TYPE TUR BLADDER L (SET/KITS/TRAYS/PACK) IMPLANT
SET TRI-LUMEN FLTR TB AIRSEAL (TUBING) ×3 IMPLANT
SPIKE FLUID TRANSFER (MISCELLANEOUS) ×6 IMPLANT
SUT VIC AB 0 CT1 27 (SUTURE) ×2
SUT VIC AB 0 CT1 27XBRD ANBCTR (SUTURE) IMPLANT
SUT VIC AB 4-0 PS2 18 (SUTURE) ×9 IMPLANT
SUT VICRYL 0 UR6 27IN ABS (SUTURE) IMPLANT
SUT VLOC 180 0 9IN  GS21 (SUTURE) ×2
SUT VLOC 180 0 9IN GS21 (SUTURE) IMPLANT
TOWEL OR 17X26 10 PK STRL BLUE (TOWEL DISPOSABLE) ×3 IMPLANT
TROCAR PORT AIRSEAL 8X120 (TROCAR) IMPLANT
WATER STERILE IRR 500ML POUR (IV SOLUTION) IMPLANT

## 2022-09-22 NOTE — Anesthesia Postprocedure Evaluation (Signed)
Anesthesia Post Note  Patient: Deanna Schwartz  Procedure(s) Performed: XI ROBOTIC ASSISTED LAPAROSCOPIC HYSTERECTOMY AND SALPINGECTOMY (Bilateral: Abdomen) CYSTOSCOPY (Urethra)     Patient location during evaluation: PACU Anesthesia Type: General Level of consciousness: awake Pain management: pain level controlled Vital Signs Assessment: post-procedure vital signs reviewed and stable Respiratory status: spontaneous breathing, nonlabored ventilation and respiratory function stable Cardiovascular status: blood pressure returned to baseline and stable Postop Assessment: no apparent nausea or vomiting Anesthetic complications: no   No notable events documented.  Last Vitals:  Vitals:   09/22/22 1330 09/22/22 1345  BP: (!) 158/98 (!) 141/77  Pulse: 90 97  Resp: 12 16  Temp:    SpO2: 100% 100%    Last Pain:  Vitals:   09/22/22 1330  TempSrc:   PainSc: Asleep                 Nilda Simmer

## 2022-09-22 NOTE — Anesthesia Procedure Notes (Signed)
Procedure Name: Intubation Date/Time: 09/22/2022 9:37 AM  Performed by: Clearnce Sorrel, CRNAPre-anesthesia Checklist: Patient identified, Emergency Drugs available, Suction available and Patient being monitored Patient Re-evaluated:Patient Re-evaluated prior to induction Oxygen Delivery Method: Circle System Utilized Preoxygenation: Pre-oxygenation with 100% oxygen Induction Type: IV induction Ventilation: Two handed mask ventilation required and Oral airway inserted - appropriate to patient size Laryngoscope Size: Mac and 3 Grade View: Grade II Tube type: Oral Number of attempts: 1 Airway Equipment and Method: Stylet and Oral airway Placement Confirmation: ETT inserted through vocal cords under direct vision, positive ETCO2 and breath sounds checked- equal and bilateral (DL by SRNA, grade 2 view, DLx1 by MD, successful intubation by MD) Secured at: 23 cm Tube secured with: Tape Dental Injury: Teeth and Oropharynx as per pre-operative assessment

## 2022-09-22 NOTE — Brief Op Note (Signed)
09/22/2022  12:45 PM  PATIENT:  Deanna Schwartz  42 y.o. female  PRE-OPERATIVE DIAGNOSIS:  menorrhagia to anemia  POST-OPERATIVE DIAGNOSIS:  menorrhagia to anemia, fibroids  PROCEDURE:  Procedure(s): XI ROBOTIC ASSISTED LAPAROSCOPIC HYSTERECTOMY AND SALPINGECTOMY (Bilateral) CYSTOSCOPY (N/A)  SURGEON:  Surgeon(s) and Role:    * Bovard-Stuckert, Jerrico Covello, MD - Primary  ASSISTANTS: Gaylord Shih, RNFA   ANESTHESIA:   local and general  EBL:  25 mL IVF and uop per anesthesia  BLOOD ADMINISTERED:none  DRAINS: Urinary Catheter (Foley)   LOCAL MEDICATIONS USED:  MARCAINE     SPECIMEN:  Source of Specimen:  uterus, cervix, B fallopian tubes  DISPOSITION OF SPECIMEN:  PATHOLOGY  COUNTS:  YES  TOURNIQUET:  * No tourniquets in log *  DICTATION: .Other Dictation: Dictation Number [id number]  PLAN OF CARE:  admit for extended recovery  PATIENT DISPOSITION:  PACU - hemodynamically stable.   Delay start of Pharmacological VTE agent (>24hrs) due to surgical blood loss or risk of bleeding: no

## 2022-09-22 NOTE — Anesthesia Preprocedure Evaluation (Addendum)
Anesthesia Evaluation  Patient identified by MRN, date of birth, ID band Patient awake    Reviewed: Allergy & Precautions, NPO status , Patient's Chart, lab work & pertinent test results  History of Anesthesia Complications Negative for: history of anesthetic complications  Airway Mallampati: II  TM Distance: >3 FB Neck ROM: Full    Dental  (+) Dental Advisory Given   Pulmonary neg shortness of breath, asthma (used inhaler this morning) , neg sleep apnea, neg COPD, neg recent URI    + wheezing      Cardiovascular (-) hypertension(-) angina (-) Past MI, (-) Cardiac Stents and (-) CABG + dysrhythmias (palpitations)  Rhythm:Regular Rate:Normal  TTE 06/04/2021: IMPRESSIONS     1. Left ventricular ejection fraction, by estimation, is 55 to 60%. Left  ventricular ejection fraction by 3D volume is 55 %. The left ventricle has  normal function. The left ventricle has no regional wall motion  abnormalities. Left ventricular diastolic   parameters were normal.   2. Right ventricular systolic function is normal. The right ventricular  size is normal.   3. The mitral valve is normal in structure. No evidence of mitral valve  regurgitation.   4. The aortic valve was not well visualized. Aortic valve regurgitation  is not visualized. No aortic stenosis is present.     Neuro/Psych  Headaches, neg Seizures PSYCHIATRIC DISORDERS (panic attacks) Anxiety Depression     Neuromuscular disease    GI/Hepatic Neg liver ROS,GERD  ,,  Endo/Other    Morbid obesityPre-diabetes  Renal/GU negative Renal ROS     Musculoskeletal   Abdominal  (+) + obese  Peds  Hematology  (+) Blood dyscrasia (iron deficiency), anemia   Anesthesia Other Findings Has not started [facility] yet.  Had a 5 day course of prednisone in December for asthma. Had 2 pills leftover that she took on 09/19/22.  Reproductive/Obstetrics                              Anesthesia Physical Anesthesia Plan  ASA: 3  Anesthesia Plan: General   Post-op Pain Management: Tylenol PO (pre-op)*   Induction: Intravenous  PONV Risk Score and Plan: 3 and Ondansetron, Dexamethasone and Treatment may vary due to age or medical condition  Airway Management Planned:   Additional Equipment:   Intra-op Plan:   Post-operative Plan: Extubation in OR  Informed Consent: I have reviewed the patients History and Physical, chart, labs and discussed the procedure including the risks, benefits and alternatives for the proposed anesthesia with the patient or authorized representative who has indicated his/her understanding and acceptance.     Dental advisory given  Plan Discussed with: CRNA and Anesthesiologist  Anesthesia Plan Comments: (Patient will get preop albuterol nebulizer.  Risks of general anesthesia discussed including, but not limited to, sore throat, hoarse voice, chipped/damaged teeth, injury to vocal cords, nausea and vomiting, allergic reactions, lung infection, heart attack, stroke, and death. All questions answered. )        Anesthesia Quick Evaluation

## 2022-09-22 NOTE — Transfer of Care (Signed)
Immediate Anesthesia Transfer of Care Note  Patient: Deanna Schwartz  Procedure(s) Performed: XI ROBOTIC ASSISTED LAPAROSCOPIC HYSTERECTOMY AND SALPINGECTOMY (Bilateral: Abdomen) CYSTOSCOPY (Urethra)  Patient Location: PACU  Anesthesia Type:General  Level of Consciousness: awake and alert   Airway & Oxygen Therapy: Patient Spontanous Breathing and Patient connected to nasal cannula oxygen  Post-op Assessment: Report given to RN and Post -op Vital signs reviewed and stable  Post vital signs: Reviewed and stable  Last Vitals:  Vitals Value Taken Time  BP 135/81 09/22/22 1303  Temp    Pulse 104 09/22/22 1308  Resp 26 09/22/22 1308  SpO2 100 % 09/22/22 1308  Vitals shown include unvalidated device data.  Last Pain:  Vitals:   09/22/22 0759  TempSrc: Oral  PainSc: 0-No pain      Patients Stated Pain Goal: 6 (01/77/93 9030)  Complications: No notable events documented.

## 2022-09-22 NOTE — Progress Notes (Signed)
Day of Surgery Procedure(s) (LRB): XI ROBOTIC ASSISTED LAPAROSCOPIC HYSTERECTOMY AND SALPINGECTOMY (Bilateral) CYSTOSCOPY (N/A)  Subjective: Patient reports incisional pain and tolerating PO.  Still feeling sleepy.  Pain at 3  Objective: I have reviewed patient's vital signs, intake and output, medications, and labs.  Gen NAD CV RRR Lungs CTAB Abs soft, app tender, ND, + BS Inc C/D/I Ext sym, NT   Assessment: s/p Procedure(s): XI ROBOTIC ASSISTED LAPAROSCOPIC HYSTERECTOMY AND SALPINGECTOMY (Bilateral) CYSTOSCOPY (N/A): stable and progressing well  Plan: Encourage ambulation Discharge when ambulating, voiding, tolerating po, pain controlled.   Will check labs Meds to pharmacy   LOS: 0 days    Janyth Contes, MD 09/22/2022, 4:51 PM

## 2022-09-22 NOTE — Discharge Summary (Signed)
Physician Discharge Summary  Patient ID: KIANTE PETROVICH MRN: [id number] DOB/AGE: [birth year] 42 y.o.  Admit date: 09/22/2022 Discharge date: 09/22/2022  Admission Diagnoses:  Discharge Diagnoses:  Principal Problem:   S/P robot-assisted surgical procedure Active Problems:   History of robot-assisted laparoscopic hysterectomy   S/P laparoscopic hysterectomy   Discharged Condition: good  Hospital Course: admitted for robot assisted TLH/BS and cysto - underwent without complication.  Some difficulty with uterine removal given size of uterus.  Jets from both ureters seen on cystotoscopy.  Postop course without complication - ambulating, voiding, tolerating po,and pain controlled - d/w to home when met guidelines  Consults: None  Significant Diagnostic Studies: labs: CBC, BMP  Treatments: surgery: robot assissted TLH/BS and cysto  Discharge Exam: Blood pressure (!) 150/70, pulse 93, temperature 97.6 F (36.4 C), resp. rate 16, height '5\' 3"'$  (1.6 m), weight 114.9 kg, last menstrual period 09/13/2022, SpO2 95 %. General appearance: alert and no distress Resp: clear to auscultation bilaterally Cardio: regular rate and rhythm GI: normal findings: soft, non-tender, +BS Inc: C/D/I Extremities: extremities normal, atraumatic, no cyanosis or edema  Disposition: Discharge disposition: 01-Home or Self Care       Discharge Instructions     Call MD for:  persistant nausea and vomiting   Complete by: As directed    Call MD for:  redness, tenderness, or signs of infection (pain, swelling, redness, odor or green/yellow discharge around incision site)   Complete by: As directed    Call MD for:  severe uncontrolled pain   Complete by: As directed    Diet - low sodium heart healthy   Complete by: As directed    Discharge instructions   Complete by: As directed    Call [phone] with questions or problems   Driving Restrictions   Complete by: As directed    While taking strong  pain medicine   Increase activity slowly   Complete by: As directed    Lifting restrictions   Complete by: As directed    No greater than 10-15lbs for 6 weeks   May shower / Bathe   Complete by: As directed    May walk up steps   Complete by: As directed    Sexual Activity Restrictions   Complete by: As directed    Pelvic rest - no douching, tampons or sex for 6 weeks      Allergies as of 09/22/2022       Reactions   Augmentin [amoxicillin-pot Clavulanate] Rash   Cephalexin Rash   Iodine Swelling, Rash   Throat swelling after eating shrimp. Patient states that she is no longer allergic to iodine or shrimp as of 09/16/22.        Medication List     TAKE these medications    albuterol 108 (90 Base) MCG/ACT inhaler Commonly known as: VENTOLIN HFA Inhale 1-2 puffs into the lungs every 6 (six) hours as needed for wheezing or shortness of breath   cetirizine 10 MG tablet Commonly known as: ZyrTEC Allergy Take 1 tablet (10 mg total) by mouth at bedtime. What changed: when to take this   EPINEPHrine 0.3 mg/0.3 mL Soaj injection Commonly known as: EpiPen 2-Pak Inject 1 pen into the muscle as needed for anaphylaxis.   fluticasone 50 MCG/ACT nasal spray Commonly known as: FLONASE Place 1 spray into both nostrils daily.   ibuprofen 800 MG tablet Commonly known as: ADVIL Take 1 tablet (800 mg total) by mouth every 8 (eight) hours as needed for moderate  pain. What changed:  medication strength how much to take reasons to take this   ketoconazole 2 % cream Commonly known as: NIZORAL Apply twice daily (morning and bedtime) until clear up to 2 weeks, then as needed for flares   Magnesium 250 MG Tabs Take 250 mg by mouth.   methylPREDNISolone 4 MG Tbpk tablet Commonly known as: MEDROL DOSEPAK Take 24 mg on day 1, 20 mg on day 2, 16 mg on day 3, 12 mg on day 4, 8 mg on day 5, 4 mg on day 6.  Take all tablets in each row at once, do not spread tablets out throughout the  day.   montelukast 10 MG tablet Commonly known as: SINGULAIR Take 1 tablet (10 mg total) by mouth at bedtime.   oxyCODONE-acetaminophen 5-325 MG tablet Commonly known as: PERCOCET/ROXICET Take 1-2 tablets by mouth every 6 (six) hours as needed for severe pain (moderate to severe pain (when tolerating fluids)).   predniSONE 20 MG tablet Commonly known as: DELTASONE Take 40 mg by mouth daily with breakfast.   PRENATAL VITAMINS PO Take 1 tablet by mouth daily.   sertraline 50 MG tablet Commonly known as: ZOLOFT Take 1 tablet (50 mg total) by mouth daily. What changed: how much to take   Symbicort 160-4.5 MCG/ACT inhaler Generic drug: budesonide-formoterol Inhale 2 puffs into the lungs 2 (two) times daily.   triamcinolone cream 0.1 % Commonly known as: KENALOG Apply up to twice daily to rash on body 2 weeks on, 2 weeks off as needed for flares   triamcinolone cream 0.1 % Commonly known as: KENALOG Apply on top of Ketocanozole cream to rash 1 week on, 1 week off. Repeat as needed   Wegovy 0.25 MG/0.5ML Soaj Generic drug: Semaglutide-Weight Management Inject 0.25 mg into the skin once a week. What changed: additional instructions        Follow-up Information     Bovard-Stuckert, Hartwell Vandiver, MD. Schedule an appointment as soon as possible for a visit in 2 week(s).   Specialty: Obstetrics and Gynecology Why: for post op check and 6 weeks for postop check Contact information: [address] [place] [address] [phone]                 Signed: Janyth Contes 09/22/2022, 5:19 PM

## 2022-09-22 NOTE — Progress Notes (Signed)
Pt c/o numbness to tip on tongue, denies dysphagia. Pt able to chew food without difficulty. Visual inspection reveals small laceration to right lateral tongue, uvula area. Dr. Lissa Hoard notified, will come to assess.   Lyndel Pleasure, RN

## 2022-09-22 NOTE — Interval H&P Note (Signed)
History and Physical Interval Note:  09/22/2022 9:20 AM  [name]  has presented today for surgery, with the diagnosis of breast issues.  The various methods of treatment have been discussed with the patient and family. After consideration of risks, benefits and other options for treatment, the patient has consented to  Procedure(s): XI ROBOTIC ASSISTED LAPAROSCOPIC HYSTERECTOMY AND SALPINGECTOMY (Bilateral) CYSTOSCOPY (N/A) as a surgical intervention.  The patient's history has been reviewed, patient examined, no change in status, stable for surgery.  I have reviewed the patient's chart and labs.  Questions were answered to the patient's satisfaction.     Deanna Schwartz

## 2022-09-22 NOTE — Op Note (Signed)
[name], Deanna Schwartz. MEDICAL RECORD NO: [id number] ACCOUNT NO: [id number] DATE OF BIRTH: 1981-05-07 FACILITY: [facility] LOCATION: [facility] PHYSICIAN: Janyth Contes, MD  Operative Report   DATE OF PROCEDURE: 09/22/2022  PREOPERATIVE DIAGNOSES: 1.  Menorrhagia. 2.  Anemia.  POSTOPERATIVE DIAGNOSES: 1.  Menorrhagia. 2.  Anemia, fibroids.  PROCEDURE:  Robotic-assisted laparoscopic hysterectomy and bilateral salpingectomy as well as cystoscopy.  SURGEON: Janyth Contes, MD  ASSISTANT:  Anson Crofts, RNFA  ANESTHESIA:  Local and general.  ESTIMATED BLOOD LOSS:  Approximately 25 mL.  IV FLUIDS AND URINE OUTPUT:  Per anesthesia.  COMPLICATIONS:  Difficulty with removal of the uterus given bilobed appearance due to large fibroids.  PATHOLOGY: Uterus, cervix, bilateral fallopian tubes.  DESCRIPTION OF PROCEDURE:  After informed consent was reviewed with the patient including risks, benefits and alternatives of the surgical procedure she was transported to the operating room, placed on the table in supine position.  General anesthesia  was induced and found to be adequate.  She was then placed in the [place]in stirrups, prepped and draped in the normal sterile fashion.  An open-sided speculum was used to aid in the placement of the Advincula uterine manipulator this was placed on the  cervix.  Foley catheter was then placed, gown and gloves were changed.  Attention was turned to the placement of the port.  Three ports were placed.  The initial one umbilical port, approximately 4 cm superior of the umbilicus.  Given the patient's short  Waist.  Accessory ports were placed on both the right and left.  The umbilical port was attempted to be placed by placing a Veress needle.  This was unsuccessful.  Therefore, it was converted to an open port where Hasson robotic trocar was placed.   The accessory ports were placed under direct visualization.  Confirmation of the port  placement for the robot were confirmed.  The robot was then brought to the table and the ports were docked in the typical fashion.  The physician was scrubbed out and  Continued procedure with robot assistance at the console.  Brief pelvic survey performed revealed 4-5cm fibroids on the anterior aspect of the uterus as well as the posterior aspect of the uterus.  The fimbriated end of the tube was grasped and elevated and was excised using the vessel sealer to the level  of the uterus.  The round ligament and uteroovarian ligaments were ligated as well with the vessel sealer.  The cardinal ligament was also ligated with the vessel sealer.  A bladder flap was created to the midline.  The attention was turned to the right  side, which in a similar fashion was grasped at the fimbriated end and using the vessel sealer the tube was excised to the level of the cornu.  The round ligament was excised as well as the uteroovarian ligament as well as the cardinal ligaments to the  level of the uterine arteries.  A bladder flap was created.  Uterine arteries were difficult to identify.  Therefore, the decision was made to proceed with circumscribing the uterus. During this process uterine arteries were identified bilaterally and  doubly ligated with the vessel sealer.  The uterus was attempted to be delivered vaginally.  The cervix had to be amputated and the uterus was grasped through the vagina with Ardis Hughs tenaculum and single tooth tenaculum and delivered.  The cuff was then  Closed with the barbed endostitch.  Cystoscopy was performed revealing jets from bilateral ureters.  The Foley was replaced and  the robot was undocked.  The ports were removed and closed the skin with 4-0 Vicryl in a subcuticular fashion.  Using the anchor stitch the umbilical fascia was closed. The umbilical incision was closed subcuticularly. The patient was awakened in stable condition and transported to the PACU. Sponge, lap and needle counts  were correct x2.   PUS D: 09/22/2022 12:54:43 pm T: 09/22/2022 1:34:00 pm  JOB: [id number]/ [id number]

## 2022-09-23 ENCOUNTER — Encounter (HOSPITAL_BASED_OUTPATIENT_CLINIC_OR_DEPARTMENT_OTHER): Payer: Self-pay | Admitting: Obstetrics and Gynecology

## 2022-09-24 LAB — SURGICAL PATHOLOGY

## 2022-09-26 ENCOUNTER — Inpatient Hospital Stay: Payer: 59

## 2022-10-03 ENCOUNTER — Inpatient Hospital Stay: Payer: 59

## 2022-10-03 VITALS — BP 102/42 | HR 82 | Temp 98.2°F | Resp 18

## 2022-10-03 DIAGNOSIS — D500 Iron deficiency anemia secondary to blood loss (chronic): Secondary | ICD-10-CM | POA: Insufficient documentation

## 2022-10-03 DIAGNOSIS — N920 Excessive and frequent menstruation with regular cycle: Secondary | ICD-10-CM | POA: Diagnosis not present

## 2022-10-03 MED ORDER — SODIUM CHLORIDE 0.9 % IV SOLN: Freq: Once | INTRAVENOUS | Status: AC

## 2022-10-03 MED ORDER — SODIUM CHLORIDE 0.9 % IV SOLN
200.0000 mg | Freq: Once | INTRAVENOUS | Status: AC
  Administered 2022-10-03: 200 mg via INTRAVENOUS
  Filled 2022-10-03: qty 200

## 2022-10-03 NOTE — Patient Instructions (Signed)

## 2022-10-10 ENCOUNTER — Ambulatory Visit
Admission: EM | Admit: 2022-10-10 | Discharge: 2022-10-10 | Disposition: A | Discharge status: Home / Self Care | Payer: 59 | Attending: Urgent Care | Admitting: Urgent Care

## 2022-10-10 ENCOUNTER — Ambulatory Visit (INDEPENDENT_AMBULATORY_CARE_PROVIDER_SITE_OTHER): Payer: 59

## 2022-10-10 DIAGNOSIS — R062 Wheezing: Secondary | ICD-10-CM | POA: Diagnosis not present

## 2022-10-10 DIAGNOSIS — R079 Chest pain, unspecified: Secondary | ICD-10-CM | POA: Diagnosis not present

## 2022-10-10 DIAGNOSIS — Z79899 Other long term (current) drug therapy: Secondary | ICD-10-CM | POA: Insufficient documentation

## 2022-10-10 DIAGNOSIS — R0602 Shortness of breath: Secondary | ICD-10-CM

## 2022-10-10 DIAGNOSIS — Z1152 Encounter for screening for COVID-19: Secondary | ICD-10-CM | POA: Diagnosis not present

## 2022-10-10 DIAGNOSIS — Z7951 Long term (current) use of inhaled steroids: Secondary | ICD-10-CM | POA: Diagnosis not present

## 2022-10-10 DIAGNOSIS — J4540 Moderate persistent asthma, uncomplicated: Secondary | ICD-10-CM | POA: Diagnosis not present

## 2022-10-10 DIAGNOSIS — Z90710 Acquired absence of both cervix and uterus: Secondary | ICD-10-CM | POA: Insufficient documentation

## 2022-10-10 DIAGNOSIS — M5489 Other dorsalgia: Secondary | ICD-10-CM | POA: Diagnosis present

## 2022-10-10 MED ORDER — PAXLOVID (300/100) 20 X 150 MG & 10 X 100MG PO TBPK: ORAL_TABLET | ORAL | 0 refills | Status: DC

## 2022-10-10 NOTE — Discharge Instructions (Signed)
We will be using 4 more days of prednisone at '50mg'$  daily with breakfast. Limit your albuterol use to 2 puffs every 6 hours only as needed. I will call or message through mychart about your x-ray results. If your COVID test is positive, go fill the prescription for Paxlovid. Keep taking Symbicort. If you continue to have symptoms, then go to the hospital.

## 2022-10-10 NOTE — ED Triage Notes (Signed)
Pt c/o SHOB, upper back pain, "heart fluttering" sx started last night-pt states she used inhalers x 2 with some relief-DOE noted-steady gait

## 2022-10-10 NOTE — ED Provider Notes (Signed)
[facility]  Note:  This document was prepared using Systems analyst and may include unintentional dictation errors.  MRN: [id number] DOB: December 16, 1980  Subjective:   Deanna Schwartz is a 42 y.o. female presenting for 1 day history of acute onset shortness of breath, heart fluttering sensation, upper back pain.  Patient states that she was using her albuterol inhaler regularly and quite a bit yesterday.  She feels like her asthma is flaring up on her.  She did take leftover prednisone this morning and feels like it is helping.  She just had a hysterectomy done this past month.  Had her postop follow-up and everything went well.  No history of clotting disorders, blood clots.  No current facility-administered medications for this encounter.  Current Outpatient Medications:    albuterol (VENTOLIN HFA) 108 (90 Base) MCG/ACT inhaler, Inhale 1-2 puffs into the lungs every 6 (six) hours as needed for wheezing or shortness of breath, Disp: 6.7 g, Rfl: 2   budesonide-formoterol (SYMBICORT) 160-4.5 MCG/ACT inhaler, Inhale 2 puffs into the lungs 2 (two) times daily., Disp: 30.6 g, Rfl: 1   cetirizine (ZYRTEC ALLERGY) 10 MG tablet, Take 1 tablet (10 mg total) by mouth at bedtime. (Patient taking differently: Take 10 mg by mouth daily.), Disp: 30 tablet, Rfl: 2   EPINEPHrine (EPIPEN 2-PAK) 0.3 mg/0.3 mL IJ SOAJ injection, Inject 1 pen into the muscle as needed for anaphylaxis., Disp: 2 each, Rfl: 3   fluticasone (FLONASE) 50 MCG/ACT nasal spray, Place 1 spray into both nostrils daily., Disp: 48 g, Rfl: 1   ibuprofen (ADVIL) 800 MG tablet, Take 1 tablet (800 mg total) by mouth every 8 (eight) hours as needed for moderate pain., Disp: 45 tablet, Rfl: 1   ketoconazole (NIZORAL) 2 % cream, Apply twice daily (morning and bedtime) until clear up to 2 weeks, then as needed for flares, Disp: 60 g, Rfl: 2   Magnesium 250 MG TABS, Take 250 mg by mouth., Disp: , Rfl:     methylPREDNISolone (MEDROL DOSEPAK) 4 MG TBPK tablet, Take 24 mg on day 1, 20 mg on day 2, 16 mg on day 3, 12 mg on day 4, 8 mg on day 5, 4 mg on day 6.  Take all tablets in each row at once, do not spread tablets out throughout the day. (Patient not taking: Reported on 09/16/2022), Disp: 21 tablet, Rfl: 0   montelukast (SINGULAIR) 10 MG tablet, Take 1 tablet (10 mg total) by mouth at bedtime., Disp: 90 tablet, Rfl: 3   oxyCODONE-acetaminophen (PERCOCET/ROXICET) 5-325 MG tablet, Take 1 - 2 tablets by mouth every 6 (six) hours as needed for severe pain (moderate to severe pain (when tolerating fluids))., Disp: 20 tablet, Rfl: 0   predniSONE (DELTASONE) 20 MG tablet, Take 40 mg by mouth daily with breakfast., Disp: , Rfl:    Prenatal Vit-Fe Fumarate-FA (PRENATAL VITAMINS PO), Take 1 tablet by mouth daily., Disp: , Rfl:    Semaglutide-Weight Management (WEGOVY) 0.25 MG/0.5ML SOAJ, Inject 0.25 mg into the skin once a week. (Patient taking differently: Inject 0.25 mg into the skin once a week. Haven't started as of 09/16/22.), Disp: 2 mL, Rfl: 2   sertraline (ZOLOFT) 50 MG tablet, Take 1 tablet (50 mg total) by mouth daily. (Patient taking differently: Take 25 mg by mouth daily.), Disp: 90 tablet, Rfl: 3   triamcinolone cream (KENALOG) 0.1 %, Apply up to twice daily to rash on body 2 weeks on, 2 weeks off as  needed for flares, Disp: 454 g, Rfl: 2   triamcinolone cream (KENALOG) 0.1 %, Apply on top of Ketocanozole cream to rash 1 week on, 1 week off. Repeat as needed, Disp: 80 g, Rfl: 0   Allergies  Allergen Reactions   Augmentin [Amoxicillin-Pot Clavulanate] Rash   Cephalexin Rash   Iodine Swelling and Rash    Throat swelling after eating shrimp. Patient states that she is no longer allergic to iodine or shrimp as of 09/16/22.    Past Medical History:  Diagnosis Date   Allergic rhinitis    Anxiety    improved per pt on 09/14/22   Asthma    Follows w/ [facility], Dr. Orvil Feil. Patient states  last asthma exacerbation was in early December 2023. States she is breathing well as of 09/16/22.   B12 deficiency with anti-parietal cell antibodies + 03/25/2011   New 03/2011 , resolved per pt on 09/16/22   Chronic constipation    resolved per pt on 09/2022   Depression    resolved per pt on 09/16/22   GERD (gastroesophageal reflux disease)    improved as of 09/16/22 per pt   H. pylori infection 2008   Hx of tx + serology   IBS (irritable bowel syndrome)    Iron deficiency anemia 08/06/2011   Hx of multiple iron infusions, most recent in 08/2022, per pt she is scheduled for an infusion on 09/19/22   Irritable bowel syndrome    Knee pain    Obesity    Follows with [facility].   Palpitations 04/2021   Echo on 06/04/21 EF 55 -60%   Panic attack    Prediabetes    many years ago per pt, labs improved as of 2024   Shortness of breath    w/ asthma exacerbations   Ulcer    Vitamin D deficiency    resolved per pt 2024     Past Surgical History:  Procedure Laterality Date   ABDOMINAL HYSTERECTOMY     CYSTOSCOPY N/A 09/22/2022   Procedure: CYSTOSCOPY;  Surgeon: Janyth Contes, MD;  Location: [facility];  Service: Gynecology;  Laterality: N/A;   FLEXIBLE SIGMOIDOSCOPY  04/14/2008   normal   NASAL SINUS SURGERY  2012   ROBOTIC ASSISTED LAPAROSCOPIC HYSTERECTOMY AND SALPINGECTOMY Bilateral 09/22/2022   Procedure: XI ROBOTIC ASSISTED LAPAROSCOPIC HYSTERECTOMY AND SALPINGECTOMY;  Surgeon: Janyth Contes, MD;  Location: [facility];  Service: Gynecology;  Laterality: Bilateral;   UPPER GASTROINTESTINAL ENDOSCOPY     around 2014    Family History  Problem Relation Age of Onset   Hyperlipidemia Mother    Hypertension Mother    Anxiety disorder Mother    Obesity Mother    Cancer Father    Hyperlipidemia Father    Hypertension Father    Diabetes Paternal Grandmother    Diabetes Maternal Aunt    Diabetes Maternal Aunt    Colon polyps  Paternal Uncle        x4   Diabetes Other        grandmother   Hypertension Other    Colon cancer Neg Hx     Social History   Tobacco Use   Smoking status: Never   Smokeless tobacco: Never  Vaping Use   Vaping Use: Never used  Substance Use Topics   Drug use: No    ROS   Objective:   Vitals: BP 119/82 (BP Location: Right Arm)   Pulse 94   Temp 99  F (37.2 C) (Oral)   Resp 20   LMP 09/13/2022 (Exact Date)   SpO2 98%   Physical Exam Constitutional:      General: She is not in acute distress.    Appearance: Normal appearance. She is well-developed. She is not ill-appearing, toxic-appearing or diaphoretic.  HENT:     Head: Normocephalic and atraumatic.     Right Ear: External ear normal.     Left Ear: External ear normal.     Nose: Nose normal.     Mouth/Throat:     Mouth: Mucous membranes are moist.  Eyes:     General: No scleral icterus.       Right eye: No discharge.        Left eye: No discharge.     Extraocular Movements: Extraocular movements intact.  Cardiovascular:     Rate and Rhythm: Normal rate and regular rhythm.     Heart sounds: Normal heart sounds. No murmur heard.    No friction rub. No gallop.  Pulmonary:     Effort: Pulmonary effort is normal. No respiratory distress.     Breath sounds: No stridor. No wheezing, rhonchi or rales.  Chest:     Chest wall: No tenderness.  Skin:    General: Skin is warm and dry.  Neurological:     General: No focal deficit present.     Mental Status: She is alert and oriented to person, place, and time.  Psychiatric:        Mood and Affect: Mood normal.        Behavior: Behavior normal.     Assessment and Plan :   PDMP not reviewed this encounter.  1. Shortness of breath   2. Moderate persistent asthma without complication     Discussed differential which does include pulmonary embolism but at this stage, we will defer ER visit which is patient's preference as well.  Recommended 4 more days of oral  prednisone at 50 mg.  Discussed appropriate use of albuterol. X-ray over-read was pending at time of discharge, recommended follow up with only abnormal results. Otherwise will not call for negative over-read. Patient was in agreement.  COVID testing pending. Counseled patient on potential for adverse effects with medications prescribed/recommended today, ER and return-to-clinic precautions discussed, patient verbalized understanding.   If patient test positive for COVID-19, should start Paxlovid.  Printed prescription for her.   Jaynee Eagles, [place] 10/10/22 1446

## 2022-10-11 LAB — SARS CORONAVIRUS 2 (TAT 6-24 HRS): SARS Coronavirus 2: NEGATIVE

## 2022-10-13 ENCOUNTER — Other Ambulatory Visit (HOSPITAL_COMMUNITY): Payer: Self-pay

## 2022-10-13 ENCOUNTER — Ambulatory Visit (INDEPENDENT_AMBULATORY_CARE_PROVIDER_SITE_OTHER): Payer: 59 | Admitting: Sports Medicine

## 2022-10-13 VITALS — HR 93 | Ht 63.0 in | Wt 255.0 lb

## 2022-10-13 DIAGNOSIS — M542 Cervicalgia: Secondary | ICD-10-CM

## 2022-10-13 DIAGNOSIS — S46812A Strain of other muscles, fascia and tendons at shoulder and upper arm level, left arm, initial encounter: Secondary | ICD-10-CM

## 2022-10-13 DIAGNOSIS — S46811A Strain of other muscles, fascia and tendons at shoulder and upper arm level, right arm, initial encounter: Secondary | ICD-10-CM

## 2022-10-13 MED ORDER — MELOXICAM 15 MG PO TABS
15.0000 mg | ORAL_TABLET | Freq: Every day | ORAL | 0 refills | Status: DC
  Filled 2022-10-13: qty 30, 30d supply, fill #0

## 2022-10-13 MED ORDER — CYCLOBENZAPRINE HCL 5 MG PO TABS
5.0000 mg | ORAL_TABLET | Freq: Every day | ORAL | 0 refills | Status: DC
  Filled 2022-10-13: qty 30, 30d supply, fill #0

## 2022-10-13 NOTE — Progress Notes (Signed)
Deanna Schwartz D.[facility] [place] Phone: [phone]   Assessment and Plan:     1. Neck pain 2. Strain of left trapezius muscle, initial encounter 3. Trapezius strain, right, initial encounter -Acute, uncomplicated, initial sports medicine visit - Most consistent with strain of cervical paraspinal, bilateral trapezius musculature, likely caused from patient sleeping in a seated position after surgery on 09/22/2022 - No red flag symptoms on physical exam, so no imaging at today's visit - Patient discontinued ibuprofen 800 mg because she felt like it was bothering her stomach - Start meloxicam 15 mg daily x2 weeks.  If still having pain after 2 weeks, complete 3rd-week of meloxicam. May use remaining meloxicam as needed once daily for pain control.  Do not to use additional NSAIDs while taking meloxicam.  May use Tylenol [phone] mg 2 to 3 times a day for breakthrough pain. - Start Flexeril 5 to 10 mg nightly as needed for muscle spasms - Start HEP for neck, trapezius  Other orders - meloxicam (MOBIC) 15 MG tablet; Take 1 tablet (15 mg total) by mouth daily. - cyclobenzaprine (FLEXERIL) 5 MG tablet; Take 1 tablet (5 mg total) by mouth at bedtime.    Pertinent previous records reviewed include none   Follow Up: 3 weeks for reevaluation.  Could consider OMT versus x-ray imaging if no improvement or worsening of symptoms.   Subjective:   I, Deanna Schwartz, am serving as a Education administrator for Doctor Glennon Mac  Chief Complaint: upper back and shoulder pain   HPI:   10/13/22 Patient is a 42 year old female complaining of upper back and shoulder pain. Patient states that she shoulder and trap area is restless, she has very intense pain, had surgery the 15th of January, was taking ib 800, doesn't know if its related, feels like she has a crook in her neck, pai for about a week, no numbness tingling, no radiating pain,      Relevant Historical Information: Hysterectomy performed 09/22/2022, GERD, IBS,  Additional pertinent review of systems negative.   Current Outpatient Medications:    albuterol (VENTOLIN HFA) 108 (90 Base) MCG/ACT inhaler, Inhale 1-2 puffs into the lungs every 6 (six) hours as needed for wheezing or shortness of breath, Disp: 6.7 g, Rfl: 2   cyclobenzaprine (FLEXERIL) 5 MG tablet, Take 1 tablet (5 mg total) by mouth at bedtime., Disp: 30 tablet, Rfl: 0   EPINEPHrine (EPIPEN 2-PAK) 0.3 mg/0.3 mL IJ SOAJ injection, Inject 1 pen into the muscle as needed for anaphylaxis., Disp: 2 each, Rfl: 3   fluticasone (FLONASE) 50 MCG/ACT nasal spray, Place 1 spray into both nostrils daily., Disp: 48 g, Rfl: 1   ibuprofen (ADVIL) 800 MG tablet, Take 1 tablet (800 mg total) by mouth every 8 (eight) hours as needed for moderate pain., Disp: 45 tablet, Rfl: 1   ketoconazole (NIZORAL) 2 % cream, Apply twice daily (morning and bedtime) until clear up to 2 weeks, then as needed for flares, Disp: 60 g, Rfl: 2   Magnesium 250 MG TABS, Take 250 mg by mouth., Disp: , Rfl:    meloxicam (MOBIC) 15 MG tablet, Take 1 tablet (15 mg total) by mouth daily., Disp: 30 tablet, Rfl: 0   montelukast (SINGULAIR) 10 MG tablet, Take 1 tablet (10 mg total) by mouth at bedtime., Disp: 90 tablet, Rfl: 3   nirmatrelvir & ritonavir (PAXLOVID, 300/100,) 20 x 150 MG & 10 x '100MG'$  TBPK, Take 2 tablets  nirmtrelvir and 1 tablet ritonavir twice daily., Disp: 30 tablet, Rfl: 0   oxyCODONE-acetaminophen (PERCOCET/ROXICET) 5-325 MG tablet, Take 1 - 2 tablets by mouth every 6 (six) hours as needed for severe pain (moderate to severe pain (when tolerating fluids))., Disp: 20 tablet, Rfl: 0   Prenatal Vit-Fe Fumarate-FA (PRENATAL VITAMINS PO), Take 1 tablet by mouth daily., Disp: , Rfl:    Semaglutide-Weight Management (WEGOVY) 0.25 MG/0.5ML SOAJ, Inject 0.25 mg into the skin once a week. (Patient taking differently: Inject 0.25 mg into the skin  once a week. Haven't started as of 09/16/22.), Disp: 2 mL, Rfl: 2   sertraline (ZOLOFT) 50 MG tablet, Take 1 tablet (50 mg total) by mouth daily. (Patient taking differently: Take 25 mg by mouth daily.), Disp: 90 tablet, Rfl: 3   triamcinolone cream (KENALOG) 0.1 %, Apply up to twice daily to rash on body 2 weeks on, 2 weeks off as needed for flares, Disp: 454 g, Rfl: 2   triamcinolone cream (KENALOG) 0.1 %, Apply on top of Ketocanozole cream to rash 1 week on, 1 week off. Repeat as needed, Disp: 80 g, Rfl: 0   budesonide-formoterol (SYMBICORT) 160-4.5 MCG/ACT inhaler, Inhale 2 puffs into the lungs 2 (two) times daily., Disp: 30.6 g, Rfl: 1   cetirizine (ZYRTEC ALLERGY) 10 MG tablet, Take 1 tablet (10 mg total) by mouth at bedtime. (Patient taking differently: Take 10 mg by mouth daily.), Disp: 30 tablet, Rfl: 2   methylPREDNISolone (MEDROL DOSEPAK) 4 MG TBPK tablet, Take 24 mg on day 1, 20 mg on day 2, 16 mg on day 3, 12 mg on day 4, 8 mg on day 5, 4 mg on day 6.  Take all tablets in each row at once, do not spread tablets out throughout the day. (Patient not taking: Reported on 09/16/2022), Disp: 21 tablet, Rfl: 0   predniSONE (DELTASONE) 20 MG tablet, Take 40 mg by mouth daily with breakfast., Disp: , Rfl:    Objective:     Vitals:   10/13/22 1433  Pulse: 93  SpO2: (!) 9%  Weight: 255 lb (115.7 kg)  Height: '5\' 3"'$  (1.6 m)      Body mass index is 45.17 kg/m.    Physical Exam:    Neck Exam: Cervical Spine- Posture normal Skin- normal, intact  Neurological-  Strength-  Right Left   Deltoid (C5) 5/5 5/5  Bicep/Brachioradialis (C5/6) 5/5  5/5  Wrist Extension (C6) 5/5 5/5  Tricep (C7) 5/5 5/5  Wrist Flexion (C7) 5/5 5/5  Grip (C8) 5/5 5/5  Finger Abduction (T1) 5/5 5/5   Sensation: intact to light touch in upper extremities bilaterally  Spurling's:  negative bilaterally Neck ROM: Full active ROM TTP: Bilateral cervical paraspinal, thoracic paraspinal and trapezius, though worse on  right compared to left NTTP: cervical spinous processes,    Electronically signed by:  Deanna Schwartz D.[facility] 2:57 PM 10/13/22

## 2022-10-13 NOTE — Patient Instructions (Addendum)
Good to see you  - Start meloxicam 15 mg daily x2 weeks.  If still having pain after 2 weeks, complete 3rd-week of meloxicam. May use remaining meloxicam as needed once daily for pain control.  Do not to use additional NSAIDs while taking meloxicam.  May use Tylenol [phone] mg 2 to 3 times a day for breakthrough pain. Shoulder trap HEP  Flexeril 5-10 mg nightly as needed for muscle spasm 3 week follow up

## 2022-10-16 ENCOUNTER — Encounter: Payer: Self-pay | Admitting: Internal Medicine

## 2022-10-21 ENCOUNTER — Other Ambulatory Visit: Payer: Self-pay | Admitting: Internal Medicine

## 2022-10-21 DIAGNOSIS — R002 Palpitations: Secondary | ICD-10-CM

## 2022-10-22 ENCOUNTER — Other Ambulatory Visit (HOSPITAL_COMMUNITY): Payer: Self-pay

## 2022-10-24 ENCOUNTER — Other Ambulatory Visit (HOSPITAL_COMMUNITY): Payer: Self-pay

## 2022-10-31 NOTE — Progress Notes (Deleted)
Benito Mccreedy D.[facility] Phone: [phone]   Assessment and Plan:     There are no diagnoses linked to this encounter.  ***   Pertinent previous records reviewed include ***   Follow Up: ***     Subjective:   I, Gregorey Nabor, am serving as a Education administrator for Doctor Glennon Mac   Chief Complaint: upper back and shoulder pain    HPI:    10/13/22 Patient is a 42 year old female complaining of upper back and shoulder pain. Patient states that she shoulder and trap area is restless, she has very intense pain, had surgery the 15th of January, was taking ib 800, doesn't know if its related, feels like she has a crook in her neck, pai for about a week, no numbness tingling, no radiating pain,      11/04/2022 Patient states    Relevant Historical Information: Hysterectomy performed 09/22/2022, GERD, IBS,  Additional pertinent review of systems negative.   Current Outpatient Medications:    albuterol (VENTOLIN HFA) 108 (90 Base) MCG/ACT inhaler, Inhale 1-2 puffs into the lungs every 6 (six) hours as needed for wheezing or shortness of breath, Disp: 6.7 g, Rfl: 2   budesonide-formoterol (SYMBICORT) 160-4.5 MCG/ACT inhaler, Inhale 2 puffs into the lungs 2 (two) times daily., Disp: 30.6 g, Rfl: 1   cetirizine (ZYRTEC ALLERGY) 10 MG tablet, Take 1 tablet (10 mg total) by mouth at bedtime. (Patient taking differently: Take 10 mg by mouth daily.), Disp: 30 tablet, Rfl: 2   cyclobenzaprine (FLEXERIL) 5 MG tablet, Take 1 tablet (5 mg total) by mouth at bedtime., Disp: 30 tablet, Rfl: 0   EPINEPHrine (EPIPEN 2-PAK) 0.3 mg/0.3 mL IJ SOAJ injection, Inject 1 pen into the muscle as needed for anaphylaxis., Disp: 2 each, Rfl: 3   fluticasone (FLONASE) 50 MCG/ACT nasal spray, Place 1 spray into both nostrils daily., Disp: 48 g, Rfl: 1   ibuprofen (ADVIL) 800 MG tablet, Take 1 tablet (800 mg total) by mouth every 8  (eight) hours as needed for moderate pain., Disp: 45 tablet, Rfl: 1   ketoconazole (NIZORAL) 2 % cream, Apply twice daily (morning and bedtime) until clear up to 2 weeks, then as needed for flares, Disp: 60 g, Rfl: 2   Magnesium 250 MG TABS, Take 250 mg by mouth., Disp: , Rfl:    meloxicam (MOBIC) 15 MG tablet, Take 1 tablet (15 mg total) by mouth daily., Disp: 30 tablet, Rfl: 0   methylPREDNISolone (MEDROL DOSEPAK) 4 MG TBPK tablet, Take 24 mg on day 1, 20 mg on day 2, 16 mg on day 3, 12 mg on day 4, 8 mg on day 5, 4 mg on day 6.  Take all tablets in each row at once, do not spread tablets out throughout the day. (Patient not taking: Reported on 09/16/2022), Disp: 21 tablet, Rfl: 0   montelukast (SINGULAIR) 10 MG tablet, Take 1 tablet (10 mg total) by mouth at bedtime., Disp: 90 tablet, Rfl: 3   nirmatrelvir & ritonavir (PAXLOVID, 300/100,) 20 x 150 MG & 10 x '100MG'$  TBPK, Take 2 tablets nirmtrelvir and 1 tablet ritonavir twice daily., Disp: 30 tablet, Rfl: 0   oxyCODONE-acetaminophen (PERCOCET/ROXICET) 5-325 MG tablet, Take 1 - 2 tablets by mouth every 6 (six) hours as needed for severe pain (moderate to severe pain (when tolerating fluids))., Disp: 20 tablet, Rfl: 0   predniSONE (DELTASONE) 20 MG tablet, Take 40 mg by mouth  daily with breakfast., Disp: , Rfl:    Prenatal Vit-Fe Fumarate-FA (PRENATAL VITAMINS PO), Take 1 tablet by mouth daily., Disp: , Rfl:    Semaglutide-Weight Management (WEGOVY) 0.25 MG/0.5ML SOAJ, Inject 0.25 mg into the skin once a week. (Patient taking differently: Inject 0.25 mg into the skin once a week. Haven't started as of 09/16/22.), Disp: 2 mL, Rfl: 2   sertraline (ZOLOFT) 50 MG tablet, Take 1 tablet (50 mg total) by mouth daily. (Patient taking differently: Take 25 mg by mouth daily.), Disp: 90 tablet, Rfl: 3   triamcinolone cream (KENALOG) 0.1 %, Apply up to twice daily to rash on body 2 weeks on, 2 weeks off as needed for flares, Disp: 454 g, Rfl: 2   triamcinolone cream  (KENALOG) 0.1 %, Apply on top of Ketocanozole cream to rash 1 week on, 1 week off. Repeat as needed, Disp: 80 g, Rfl: 0   Objective:     There were no vitals filed for this visit.    There is no height or weight on file to calculate BMI.    Physical Exam:    ***   Electronically signed by:  Benito Mccreedy D.[facility] 7:37 AM 10/31/22

## 2022-11-03 ENCOUNTER — Other Ambulatory Visit (HOSPITAL_COMMUNITY): Payer: Self-pay

## 2022-11-04 ENCOUNTER — Ambulatory Visit: Payer: 59 | Admitting: Sports Medicine

## 2022-11-11 ENCOUNTER — Other Ambulatory Visit: Payer: Self-pay | Admitting: Obstetrics and Gynecology

## 2022-11-11 DIAGNOSIS — D242 Benign neoplasm of left breast: Secondary | ICD-10-CM

## 2022-11-13 ENCOUNTER — Other Ambulatory Visit (HOSPITAL_COMMUNITY): Payer: Self-pay

## 2022-11-13 DIAGNOSIS — R350 Frequency of micturition: Secondary | ICD-10-CM | POA: Diagnosis not present

## 2022-11-13 DIAGNOSIS — B3731 Acute candidiasis of vulva and vagina: Secondary | ICD-10-CM | POA: Diagnosis not present

## 2022-11-13 MED ORDER — FLUCONAZOLE 150 MG PO TABS
150.0000 mg | ORAL_TABLET | Freq: Every day | ORAL | 0 refills | Status: DC
  Filled 2022-11-13: qty 2, 4d supply, fill #0

## 2022-11-13 MED ORDER — SULFAMETHOXAZOLE-TRIMETHOPRIM 800-160 MG PO TABS
ORAL_TABLET | ORAL | 0 refills | Status: DC
  Filled 2022-11-13: qty 6, 3d supply, fill #0

## 2022-11-21 ENCOUNTER — Encounter: Payer: Self-pay | Admitting: Internal Medicine

## 2022-11-28 ENCOUNTER — Encounter: Payer: Self-pay | Admitting: Oncology

## 2022-11-28 ENCOUNTER — Other Ambulatory Visit (HOSPITAL_COMMUNITY): Payer: Self-pay

## 2022-11-28 MED ORDER — NITROFURANTOIN MONOHYD MACRO 100 MG PO CAPS
100.0000 mg | ORAL_CAPSULE | Freq: Two times a day (BID) | ORAL | 0 refills | Status: DC
  Filled 2022-11-28: qty 14, 7d supply, fill #0

## 2022-12-01 ENCOUNTER — Ambulatory Visit: Payer: 59 | Admitting: Cardiology

## 2022-12-03 ENCOUNTER — Other Ambulatory Visit (HOSPITAL_COMMUNITY): Payer: Self-pay

## 2022-12-03 ENCOUNTER — Encounter (INDEPENDENT_AMBULATORY_CARE_PROVIDER_SITE_OTHER): Payer: 59 | Admitting: Family Medicine

## 2022-12-03 ENCOUNTER — Encounter: Payer: Self-pay | Admitting: Internal Medicine

## 2022-12-03 ENCOUNTER — Ambulatory Visit: Payer: 59 | Attending: Cardiology | Admitting: Internal Medicine

## 2022-12-03 ENCOUNTER — Ambulatory Visit: Payer: 59

## 2022-12-03 VITALS — BP 118/72 | HR 83 | Ht 63.0 in | Wt 256.2 lb

## 2022-12-03 DIAGNOSIS — R002 Palpitations: Secondary | ICD-10-CM

## 2022-12-03 NOTE — Patient Instructions (Signed)
Medication Instructions:  Your physician recommends that you continue on your current medications as directed. Please refer to the Current Medication list given to you today.   *If you need a refill on your cardiac medications before your next appointment, please call your pharmacy*   Lab Work: None ordered   Testing/Procedures: ZIO XT- Long Term Monitor Instructions  Your physician has requested you wear a ZIO patch monitor for 7 days.  This is a single patch monitor. [employer] supplies one patch monitor per enrollment. Additional stickers are not available. Please do not apply patch if you will be having a Nuclear Stress Test,  Echocardiogram, Cardiac CT, MRI, or Chest Xray during the period you would be wearing the  monitor. The patch cannot be worn during these tests. You cannot remove and re-apply the  ZIO XT patch monitor.  Your ZIO patch monitor will be mailed 3 day USPS to your address on file. It may take 3-5 days  to receive your monitor after you have been enrolled.  Once you have received your monitor, please review the enclosed instructions. Your monitor  has already been registered assigning a specific monitor serial # to you.  Billing and Patient Assistance Program Information  We have supplied [employer] with any of your insurance information on file for billing purposes. [employer] offers a sliding scale Patient Assistance Program for patients that do not have  insurance, or whose insurance does not completely cover the cost of the ZIO monitor.  You must apply for the Patient Assistance Program to qualify for this discounted rate.  To apply, please call [employer] at [phone], select option 4, select option 2, ask to apply for  Patient Assistance Program. Theodore Demark will ask your household income, and how many people  are in your household. They will quote your out-of-pocket cost based on that information.  [employer] will also be able to set up a 29-month, interest-free payment  plan if needed.  Applying the monitor   Shave hair from upper left chest.  Hold abrader disc by orange tab. Rub abrader in 40 strokes over the upper left chest as  indicated in your monitor instructions.  Clean area with 4 enclosed alcohol pads. Let dry.  Apply patch as indicated in monitor instructions. Patch will be placed under collarbone on left  side of chest with arrow pointing upward.  Rub patch adhesive wings for 2 minutes. Remove white label marked "1". Remove the white  label marked "2". Rub patch adhesive wings for 2 additional minutes.  While looking in a mirror, press and release button in center of patch. A small green light will  flash 3-4 times. This will be your only indicator that the monitor has been turned on.  Do not shower for the first 24 hours. You may shower after the first 24 hours.  Press the button if you feel a symptom. You will hear a small click. Record Date, Time and  Symptom in the Patient Logbook.  When you are ready to remove the patch, follow instructions on the last 2 pages of Patient  Logbook. Stick patch monitor onto the last page of Patient Logbook.  Place Patient Logbook in the blue and white box. Use locking tab on box and tape box closed  securely. The blue and white box has prepaid postage on it. Please place it in the mailbox as  soon as possible. Your physician should have your test results approximately 7 days after the  monitor has been mailed back to  [employer].  Call [employer] at [phone] if you have questions regarding  your ZIO XT patch monitor. Call them immediately if you see an orange light blinking on your  monitor.  If your monitor falls off in less than 4 days, contact our Monitor department at [phone].  If your monitor becomes loose or falls off after 4 days call [employer] at [phone] for suggestions on securing your monitor.    Follow-Up: At [facility], you and your health needs  are our priority.  As part of our continuing mission to provide you with exceptional heart care, we have created designated Provider Care Teams.  These Care Teams include your primary Cardiologist (physician) and Advanced Practice Providers (APPs -  Physician Assistants and Nurse Practitioners) who all work together to provide you with the care you need, when you need it.  We recommend signing up for the patient portal called "MyChart".  Sign up information is provided on this After Visit Summary.  MyChart is used to connect with patients for Virtual Visits (Telemedicine).  Patients are able to view lab/test results, encounter notes, upcoming appointments, etc.  Non-urgent messages can be sent to your provider as well.   To learn more about what you can do with MyChart, go to [email or web address].    Your next appointment:   Follow-up as needed  Provider:   Janina Mayo, MD

## 2022-12-03 NOTE — Progress Notes (Unsigned)
Enrolled for [employer] to mail a ZIO XT long term holter monitor to the patients address on file.  

## 2022-12-03 NOTE — Progress Notes (Signed)
Cardiology Office Note:    Date:  12/03/2022   ID:  [name], DOB [birth year], MRN [id number]  PCP:  Cassandria Anger, MD   [facility] Providers Cardiologist:  None     Referring MD: Cassandria Anger, MD   No chief complaint on file. Palpitations  History of Present Illness:    Deanna Schwartz is a 42 y.o. female with a hx of asthma, GERD, anxiety, prior normal echocardiograms 2014, 2022. Referral from her PCP Dr. Alain Marion for palpitations. She reported a day of SOB and heart fluttering in the ED in February. She is concerned her asthma is flaring. TSH was normal. Reported syncopal episode in 2017. A holter was ordered.  Today, she reports palpitations since December. She stopped caffeine. This helped. She can get light headed. Her asthma has been more frequent. Using her inhaler more often. No chest pain. No syncope. No premature CAD in her family. No cardiac dx hx.   Past Medical History:  Diagnosis Date   Allergic rhinitis    Anxiety    improved per pt on 09/14/22   Asthma    Follows w/ [facility], Dr. Orvil Feil. Patient states last asthma exacerbation was in early December 2023. States she is breathing well as of 09/16/22.   B12 deficiency with anti-parietal cell antibodies + 03/25/2011   New 03/2011 , resolved per pt on 09/16/22   Chronic constipation    resolved per pt on 09/2022   Depression    resolved per pt on 09/16/22   GERD (gastroesophageal reflux disease)    improved as of 09/16/22 per pt   H. pylori infection 2008   Hx of tx + serology   IBS (irritable bowel syndrome)    Iron deficiency anemia 08/06/2011   Hx of multiple iron infusions, most recent in 08/2022, per pt she is scheduled for an infusion on 09/19/22   Irritable bowel syndrome    Knee pain    Obesity    Follows with [facility].   Palpitations 04/2021   Echo on 06/04/21 EF 55 -60%   Panic attack    Prediabetes    many years ago per pt, labs improved  as of 2024   Shortness of breath    w/ asthma exacerbations   Ulcer    Vitamin D deficiency    resolved per pt 2024    Past Surgical History:  Procedure Laterality Date   ABDOMINAL HYSTERECTOMY     CYSTOSCOPY N/A 09/22/2022   Procedure: CYSTOSCOPY;  Surgeon: Janyth Contes, MD;  Location: [facility];  Service: Gynecology;  Laterality: N/A;   FLEXIBLE SIGMOIDOSCOPY  04/14/2008   normal   NASAL SINUS SURGERY  2012   ROBOTIC ASSISTED LAPAROSCOPIC HYSTERECTOMY AND SALPINGECTOMY Bilateral 09/22/2022   Procedure: XI ROBOTIC ASSISTED LAPAROSCOPIC HYSTERECTOMY AND SALPINGECTOMY;  Surgeon: Janyth Contes, MD;  Location: [facility];  Service: Gynecology;  Laterality: Bilateral;   UPPER GASTROINTESTINAL ENDOSCOPY     around 2014    Current Medications: Current Outpatient Medications on File Prior to Visit  Medication Sig Dispense Refill   albuterol (VENTOLIN HFA) 108 (90 Base) MCG/ACT inhaler Inhale 1-2 puffs into the lungs every 6 (six) hours as needed for wheezing or shortness of breath 6.7 g 2   EPINEPHrine (EPIPEN 2-PAK) 0.3 mg/0.3 mL IJ SOAJ injection Inject 1 pen into the muscle as needed for anaphylaxis. 2 each 3   fluconazole (DIFLUCAN) 150 MG tablet Take 1 tablet (  150 mg total) by mouth daily. May repeat in 3 days if no relief. 2 tablet 0   fluticasone (FLONASE) 50 MCG/ACT nasal spray Place 1 spray into both nostrils daily. 48 g 1   ibuprofen (ADVIL) 800 MG tablet Take 1 tablet (800 mg total) by mouth every 8 (eight) hours as needed for moderate pain. 45 tablet 1   ketoconazole (NIZORAL) 2 % cream Apply twice daily (morning and bedtime) until clear up to 2 weeks, then as needed for flares 60 g 2   Magnesium 250 MG TABS Take 250 mg by mouth.     montelukast (SINGULAIR) 10 MG tablet Take 1 tablet (10 mg total) by mouth at bedtime. 90 tablet 3   nirmatrelvir & ritonavir (PAXLOVID, 300/100,) 20 x 150 MG & 10 x 100MG  TBPK Take 2 tablets  nirmtrelvir and 1 tablet ritonavir twice daily. 30 tablet 0   nitrofurantoin, macrocrystal-monohydrate, (MACROBID) 100 MG capsule Take 1 capsule (100 mg total) by mouth every 12 (twelve) hours for 7 days 14 capsule 0   oxyCODONE-acetaminophen (PERCOCET/ROXICET) 5-325 MG tablet Take 1 - 2 tablets by mouth every 6 (six) hours as needed for severe pain (moderate to severe pain (when tolerating fluids)). 20 tablet 0   Prenatal Vit-Fe Fumarate-FA (PRENATAL VITAMINS PO) Take 1 tablet by mouth daily.     Semaglutide-Weight Management (WEGOVY) 0.25 MG/0.5ML SOAJ Inject 0.25 mg into the skin once a week. (Patient taking differently: Inject 0.25 mg into the skin once a week. Haven't started as of 09/16/22.) 2 mL 2   sertraline (ZOLOFT) 50 MG tablet Take 1 tablet (50 mg total) by mouth daily. (Patient taking differently: Take 25 mg by mouth daily.) 90 tablet 3   sulfamethoxazole-trimethoprim (BACTRIM DS) 800-160 MG tablet Take 1 tablet by mouth every 12 hours for 3 days. 6 tablet 0   triamcinolone cream (KENALOG) 0.1 % Apply up to twice daily to rash on body 2 weeks on, 2 weeks off as needed for flares 454 g 2   triamcinolone cream (KENALOG) 0.1 % Apply on top of Ketocanozole cream to rash 1 week on, 1 week off. Repeat as needed 80 g 0   budesonide-formoterol (SYMBICORT) 160-4.5 MCG/ACT inhaler Inhale 2 puffs into the lungs 2 (two) times daily. 30.6 g 1   cetirizine (ZYRTEC ALLERGY) 10 MG tablet Take 1 tablet (10 mg total) by mouth at bedtime. (Patient taking differently: Take 10 mg by mouth daily.) 30 tablet 2   cyclobenzaprine (FLEXERIL) 5 MG tablet Take 1 tablet (5 mg total) by mouth at bedtime. (Patient not taking: Reported on 12/03/2022) 30 tablet 0   meloxicam (MOBIC) 15 MG tablet Take 1 tablet (15 mg total) by mouth daily. (Patient not taking: Reported on 12/03/2022) 30 tablet 0   methylPREDNISolone (MEDROL DOSEPAK) 4 MG TBPK tablet Take 24 mg on day 1, 20 mg on day 2, 16 mg on day 3, 12 mg on day 4, 8 mg on  day 5, 4 mg on day 6.  Take all tablets in each row at once, do not spread tablets out throughout the day. 21 tablet 0   predniSONE (DELTASONE) 20 MG tablet Take 40 mg by mouth daily with breakfast.     No current facility-administered medications on file prior to visit.     Allergies:   Augmentin [amoxicillin-pot clavulanate], Cephalexin, and Iodine   Social History   Socioeconomic History   Marital status: Married    Spouse name: Not on file   Number of children: 3  Years of education: Not on file   Highest education level: Not on file  Occupational History   Occupation: Financial controller [employer]    Employer: [employer]  Tobacco Use   Smoking status: Never   Smokeless tobacco: Never  Vaping Use   Vaping Use: Never used  Substance and Sexual Activity   Alcohol use: Not on file   Drug use: No   Sexual activity: Not on file    Comment: husband - vasectomy  Other Topics Concern   Not on file  Social History Narrative   HSG, [employer] in [place]. Occupation: [employer]. married  in Oct 2009. Son born in 2009, [place] dtrs - '07, '11. Marriage in good health.   Social Determinants of Health   Financial Resource Strain: Not on file  Food Insecurity: Not on file  Transportation Needs: Not on file  Physical Activity: Not on file  Stress: Not on file  Social Connections: Not on file     Family History: The patient's family history includes Anxiety disorder in her mother; Cancer in her father; Colon polyps in her paternal uncle; Diabetes in her maternal aunt, maternal aunt, paternal grandmother, and another family member; Hyperlipidemia in her father and mother; Hypertension in her father, mother, and another family member; Obesity in her mother. There is no history of Colon cancer.  ROS:   Please see the history of present illness.     All other systems reviewed and are negative.  EKGs/Labs/Other Studies Reviewed:    The following studies were reviewed  today:   EKG:  EKG is  ordered today.  The ekg ordered today demonstrates   12/03/2022- NSR  Recent Labs: 04/21/2022: TSH 1.41 09/19/2022: ALT 21 09/22/2022: BUN 13; Creatinine, Ser 0.83; Hemoglobin 12.9; Platelets 225; Potassium 4.3; Sodium 136   Recent Lipid Panel    Component Value Date/Time   CHOL 177 08/15/2022 1017   CHOL 219 (H) 07/23/2021 1612   TRIG 157.0 (H) 08/15/2022 1017   HDL 43.30 08/15/2022 1017   HDL 53 07/23/2021 1612   CHOLHDL 4 08/15/2022 1017   VLDL 31.4 08/15/2022 1017   LDLCALC 102 (H) 08/15/2022 1017   LDLCALC 132 (H) 07/23/2021 1612   LDLDIRECT 105.0 04/21/2022 1536     Risk Assessment/Calculations:     Physical Exam:    VS:  Vitals:   12/03/22 0944  BP: 118/72  Pulse: 83  SpO2: 97%     LMP 09/13/2022 (Exact Date)     Wt Readings from Last 3 Encounters:  10/13/22 255 lb (115.7 kg)  09/22/22 253 lb 4.8 oz (114.9 kg)  09/19/22 252 lb 12.8 oz (114.7 kg)     GEN:  Well nourished, well developed in no acute distress HEENT: Normal NECK: No JVD; No carotid bruits LYMPHATICS: No lymphadenopathy CARDIAC: RRR, no murmurs, rubs, gallops RESPIRATORY:  nl wob, + wheezing.  ABDOMEN: Soft, non-tender, non-distended MUSCULOSKELETAL:  No edema; No deformity  SKIN: Warm and dry NEUROLOGIC:  Alert and oriented x 3 PSYCHIATRIC:  Normal affect   ASSESSMENT:    Palpitations: suspect related to albuterol/asthma. Can get a 7 day ziopatch to ensure no significant SVT. Recommend hydration with electrolytes and continuing to cut caffeine. PLAN:    In order of problems listed above:  7 day ziopatch Follow up PRN     Medication Adjustments/Labs and Tests Ordered: Current medicines are reviewed at length with the patient today.  Concerns regarding medicines are outlined above.  No orders  of the defined types were placed in this encounter.  No orders of the defined types were placed in this encounter.   There are no Patient Instructions on file for  this visit.   Signed, Janina Mayo, MD  12/03/2022 9:19 AM    [place]

## 2022-12-04 ENCOUNTER — Inpatient Hospital Stay: Admission: RE | Admit: 2022-12-04 | Payer: 59 | Source: Ambulatory Visit

## 2022-12-04 ENCOUNTER — Other Ambulatory Visit: Payer: Self-pay | Admitting: Sports Medicine

## 2022-12-04 ENCOUNTER — Other Ambulatory Visit (HOSPITAL_COMMUNITY): Payer: Self-pay

## 2022-12-08 ENCOUNTER — Other Ambulatory Visit: Payer: Self-pay

## 2022-12-16 ENCOUNTER — Inpatient Hospital Stay: Admission: RE | Admit: 2022-12-16 | Payer: 59 | Source: Ambulatory Visit

## 2022-12-16 DIAGNOSIS — Z0289 Encounter for other administrative examinations: Secondary | ICD-10-CM

## 2022-12-17 ENCOUNTER — Encounter (INDEPENDENT_AMBULATORY_CARE_PROVIDER_SITE_OTHER): Payer: 59 | Admitting: Family Medicine

## 2022-12-22 ENCOUNTER — Encounter: Payer: Self-pay | Admitting: *Deleted

## 2022-12-29 NOTE — Progress Notes (Signed)
Subjective:    Patient ID: Deanna Schwartz, female    DOB: 01/09/1981, 42 y.o.   MRN: [id number]      HPI Belicia is here for No chief complaint on file.    Dizziness -     Medications and allergies reviewed with patient and updated if appropriate.  Current Outpatient Medications on File Prior to Visit  Medication Sig Dispense Refill   albuterol (VENTOLIN HFA) 108 (90 Base) MCG/ACT inhaler Inhale 1-2 puffs into the lungs every 6 (six) hours as needed for wheezing or shortness of breath 6.7 g 2   budesonide-formoterol (SYMBICORT) 160-4.5 MCG/ACT inhaler Inhale 2 puffs into the lungs 2 (two) times daily. 30.6 g 1   cetirizine (ZYRTEC ALLERGY) 10 MG tablet Take 1 tablet (10 mg total) by mouth at bedtime. (Patient taking differently: Take 10 mg by mouth daily.) 30 tablet 2   cyclobenzaprine (FLEXERIL) 5 MG tablet Take 1 tablet (5 mg total) by mouth at bedtime. (Patient not taking: Reported on 12/03/2022) 30 tablet 0   EPINEPHrine (EPIPEN 2-PAK) 0.3 mg/0.3 mL IJ SOAJ injection Inject 1 pen into the muscle as needed for anaphylaxis. 2 each 3   fluconazole (DIFLUCAN) 150 MG tablet Take 1 tablet (150 mg total) by mouth daily. May repeat in 3 days if no relief. 2 tablet 0   fluticasone (FLONASE) 50 MCG/ACT nasal spray Place 1 spray into both nostrils daily. 48 g 1   ibuprofen (ADVIL) 800 MG tablet Take 1 tablet (800 mg total) by mouth every 8 (eight) hours as needed for moderate pain. 45 tablet 1   ketoconazole (NIZORAL) 2 % cream Apply twice daily (morning and bedtime) until clear up to 2 weeks, then as needed for flares 60 g 2   Magnesium 250 MG TABS Take 250 mg by mouth.     meloxicam (MOBIC) 15 MG tablet Take 1 tablet (15 mg total) by mouth daily. (Patient not taking: Reported on 12/03/2022) 30 tablet 0   methylPREDNISolone (MEDROL DOSEPAK) 4 MG TBPK tablet Take 24 mg on day 1, 20 mg on day 2, 16 mg on day 3, 12 mg on day 4, 8 mg on day 5, 4 mg on day 6.  Take all tablets in each  row at once, do not spread tablets out throughout the day. 21 tablet 0   montelukast (SINGULAIR) 10 MG tablet Take 1 tablet (10 mg total) by mouth at bedtime. 90 tablet 3   nirmatrelvir & ritonavir (PAXLOVID, 300/100,) 20 x 150 MG & 10 x  TBPK Take 2 tablets nirmtrelvir and 1 tablet ritonavir twice daily. 30 tablet 0   nitrofurantoin, macrocrystal-monohydrate, (MACROBID) 100 MG capsule Take 1 capsule (100 mg total) by mouth every 12 (twelve) hours for 7 days 14 capsule 0   oxyCODONE-acetaminophen (PERCOCET/ROXICET) 5-325 MG tablet Take 1 - 2 tablets by mouth every 6 (six) hours as needed for severe pain (moderate to severe pain (when tolerating fluids)). 20 tablet 0   predniSONE (DELTASONE) 20 MG tablet Take 40 mg by mouth daily with breakfast.     Prenatal Vit-Fe Fumarate-FA (PRENATAL VITAMINS PO) Take 1 tablet by mouth daily.     Semaglutide-Weight Management (WEGOVY) 0.25 MG/0.5ML SOAJ Inject 0.25 mg into the skin once a week. (Patient taking differently: Inject 0.25 mg into the skin once a week. Haven't started as of 09/16/22.) 2 mL 2   sertraline (ZOLOFT) 50 MG tablet Take 1 tablet (50 mg total) by mouth daily. (Patient taking differently: Take 25 mg  by mouth daily.) 90 tablet 3   sulfamethoxazole-trimethoprim (BACTRIM DS) 800-160 MG tablet Take 1 tablet by mouth every 12 hours for 3 days. 6 tablet 0   triamcinolone cream (KENALOG) 0.1 % Apply up to twice daily to rash on body 2 weeks on, 2 weeks off as needed for flares 454 g 2   triamcinolone cream (KENALOG) 0.1 % Apply on top of Ketocanozole cream to rash 1 week on, 1 week off. Repeat as needed 80 g 0   No current facility-administered medications on file prior to visit.    Review of Systems     Objective:  There were no vitals filed for this visit. BP Readings from Last 3 Encounters:  12/03/22 118/72  10/10/22 119/82  10/03/22 (!) 102/42   Wt Readings from Last 3 Encounters:  12/03/22 256 lb 3.2 oz (116.2 kg)  10/13/22 255 lb  (115.7 kg)  09/22/22 253 lb 4.8 oz (114.9 kg)   There is no height or weight on file to calculate BMI.    Physical Exam         Assessment & Plan:    See Problem List for Assessment and Plan of chronic medical problems.

## 2022-12-30 ENCOUNTER — Encounter: Payer: Self-pay | Admitting: Internal Medicine

## 2022-12-30 ENCOUNTER — Other Ambulatory Visit: Payer: Self-pay

## 2022-12-30 ENCOUNTER — Ambulatory Visit (INDEPENDENT_AMBULATORY_CARE_PROVIDER_SITE_OTHER): Payer: 59 | Admitting: Internal Medicine

## 2022-12-30 VITALS — BP 118/76 | HR 75 | Temp 98.0°F | Ht 63.0 in | Wt 254.0 lb

## 2022-12-30 DIAGNOSIS — R739 Hyperglycemia, unspecified: Secondary | ICD-10-CM | POA: Diagnosis not present

## 2022-12-30 DIAGNOSIS — J301 Allergic rhinitis due to pollen: Secondary | ICD-10-CM

## 2022-12-30 DIAGNOSIS — R42 Dizziness and giddiness: Secondary | ICD-10-CM | POA: Diagnosis not present

## 2022-12-30 DIAGNOSIS — D508 Other iron deficiency anemias: Secondary | ICD-10-CM | POA: Diagnosis not present

## 2022-12-30 LAB — COMPREHENSIVE METABOLIC PANEL
ALT: 15 U/L (ref 0–35)
AST: 16 U/L (ref 0–37)
Albumin: 4.1 g/dL (ref 3.5–5.2)
Alkaline Phosphatase: 55 U/L (ref 39–117)
BUN: 13 mg/dL (ref 6–23)
CO2: 29 mEq/L (ref 19–32)
Calcium: 9.3 mg/dL (ref 8.4–10.5)
Chloride: 102 mEq/L (ref 96–112)
Creatinine, Ser: 0.83 mg/dL (ref 0.40–1.20)
GFR: 87.9 mL/min (ref >60.00)
Glucose, Bld: 91 mg/dL (ref 70–99)
Potassium: 4 mEq/L (ref 3.5–5.1)
Sodium: 137 mEq/L (ref 135–145)
Total Bilirubin: 0.4 mg/dL (ref 0.2–1.2)
Total Protein: 7.4 g/dL (ref 6.0–8.3)

## 2022-12-30 LAB — CBC WITH DIFFERENTIAL/PLATELET
Basophils Absolute: 0.1 10*3/uL (ref 0.0–0.1)
Basophils Relative: 0.7 % (ref 0.0–3.0)
Eosinophils Absolute: 0.8 10*3/uL — ABNORMAL HIGH (ref 0.0–0.7)
Eosinophils Relative: 11.3 % — ABNORMAL HIGH (ref 0.0–5.0)
HCT: 38.9 % (ref 36.0–46.0)
Hemoglobin: 13.2 g/dL (ref 12.0–15.0)
Lymphocytes Relative: 21.9 % (ref 12.0–46.0)
Lymphs Abs: 1.6 10*3/uL (ref 0.7–4.0)
MCHC: 33.9 g/dL (ref 30.0–36.0)
MCV: 88.8 fl (ref 78.0–100.0)
Monocytes Absolute: 0.4 10*3/uL (ref 0.1–1.0)
Monocytes Relative: 5.3 % (ref 3.0–12.0)
Neutro Abs: 4.4 10*3/uL (ref 1.4–7.7)
Neutrophils Relative %: 60.8 % (ref 43.0–77.0)
Platelets: 247 10*3/uL (ref 150.0–400.0)
RBC: 4.38 Mil/uL (ref 3.87–5.11)
RDW: 13.6 % (ref 11.5–15.5)
WBC: 7.3 10*3/uL (ref 4.0–10.5)

## 2022-12-30 LAB — IBC PANEL
Iron: 117 ug/dL (ref 42–145)
Saturation Ratios: 37 % (ref 20.0–50.0)
TIBC: 316.4 ug/dL (ref 250.0–450.0)
Transferrin: 226 mg/dL (ref 212.0–360.0)

## 2022-12-30 LAB — HEMOGLOBIN A1C: Hgb A1c MFr Bld: 5.4 % (ref 4.6–6.5)

## 2022-12-30 LAB — FERRITIN: Ferritin: 147.8 ng/mL (ref 10.0–291.0)

## 2022-12-30 MED ORDER — MECLIZINE HCL 12.5 MG PO TABS
12.5000 mg | ORAL_TABLET | Freq: Three times a day (TID) | ORAL | 1 refills | Status: DC | PRN
  Filled 2022-12-30: qty 30, 5d supply, fill #0

## 2022-12-30 NOTE — Patient Instructions (Addendum)
Blood work was ordered.   The lab is on the first floor.    Medications changes include :   meclizine 12.5 - 25 mg three times a day for vertigo     Return if symptoms worsen or fail to improve.    Benign Positional Vertigo Vertigo is the feeling that you or your surroundings are moving when they are not. Benign positional vertigo is the most common form of vertigo. This is usually a harmless condition (benign). This condition is positional. This means that symptoms are triggered by certain movements and positions. This condition can be dangerous if it occurs while you are doing something that could cause harm to yourself or others. This includes activities such as driving or operating machinery. What are the causes? The inner ear has fluid-filled canals that help your brain sense movement and balance. When the fluid moves, the brain receives messages about your body's position. With benign positional vertigo, calcium crystals in the inner ear break free and disturb the inner ear area. This causes your brain to receive confusing messages about your body's position. What increases the risk? You are more likely to develop this condition if: You are a woman. You are 64 years of age or older. You have recently had a head injury. You have an inner ear disease. What are the signs or symptoms? Symptoms of this condition usually happen when you move your head or your eyes in different directions. Symptoms may start suddenly and usually last for less than a minute. They include: Loss of balance and falling. Feeling like you are spinning or moving. Feeling like your surroundings are spinning or moving. Nausea and vomiting. Blurred vision. Dizziness. Involuntary eye movement (nystagmus). Symptoms can be mild and cause only minor problems, or they can be severe and interfere with daily life. Episodes of benign positional vertigo may return (recur) over time. Symptoms may also improve  over time. How is this diagnosed? This condition may be diagnosed based on: Your medical history. A physical exam of the head, neck, and ears. Positional tests to check for or stimulate vertigo. You may be asked to turn your head and change positions, such as going from sitting to lying down. A health care provider will watch for symptoms of vertigo. You may be referred to a health care provider who specializes in ear, nose, and throat problems (ENT or otolaryngologist) or a provider who specializes in disorders of the nervous system (neurologist). How is this treated?  This condition may be treated in a session in which your health care provider moves your head in specific positions to help the displaced crystals in your inner ear move. Treatment for this condition may take several sessions. Surgery may be needed in severe cases, but this is rare. In some cases, benign positional vertigo may resolve on its own in 2-4 weeks. Follow these instructions at home: Safety Move slowly. Avoid sudden body or head movements or certain positions, as told by your health care provider. Avoid driving or operating machinery until your health care provider says it is safe. Avoid doing any tasks that would be dangerous to you or others if vertigo occurs. If you have trouble walking or keeping your balance, try using a cane for stability. If you feel dizzy or unstable, sit down right away. Return to your normal activities as told by your health care provider. Ask your health care provider what activities are safe for you. General instructions Take over-the-counter and prescription  medicines only as told by your health care provider. Drink enough fluid to keep your urine pale yellow. Keep all follow-up visits. This is important. Contact a health care provider if: You have a fever. Your condition gets worse or you develop new symptoms. Your family or friends notice any behavioral changes. You have nausea or  vomiting that gets worse. You have numbness or a prickling and tingling sensation. Get help right away if you: Have difficulty speaking or moving. Are always dizzy or faint. Develop severe headaches. Have weakness in your legs or arms. Have changes in your hearing or vision. Develop a stiff neck. Develop sensitivity to light. These symptoms may represent a serious problem that is an emergency. Do not wait to see if the symptoms will go away. Get medical help right away. Call your local emergency services (911 in the [place] Do not drive yourself to the hospital. Summary Vertigo is the feeling that you or your surroundings are moving when they are not. Benign positional vertigo is the most common form of vertigo. This condition is caused by calcium crystals in the inner ear that become displaced. This causes a disturbance in an area of the inner ear that helps your brain sense movement and balance. Symptoms include loss of balance and falling, feeling that you or your surroundings are moving, nausea and vomiting, and blurred vision. This condition can be diagnosed based on symptoms, a physical exam, and positional tests. Follow safety instructions as told by your health care provider and keep all follow-up visits. This is important. This information is not intended to replace advice given to you by your health care provider. Make sure you discuss any questions you have with your health care provider. Document Revised: 07/25/2020 Document Reviewed: 07/25/2020 Elsevier Patient Education  2023 [employer].

## 2023-01-05 ENCOUNTER — Encounter: Payer: Self-pay | Admitting: Internal Medicine

## 2023-01-06 ENCOUNTER — Other Ambulatory Visit (HOSPITAL_COMMUNITY): Payer: Self-pay

## 2023-01-06 ENCOUNTER — Other Ambulatory Visit (HOSPITAL_BASED_OUTPATIENT_CLINIC_OR_DEPARTMENT_OTHER): Payer: Self-pay

## 2023-01-06 ENCOUNTER — Encounter (HOSPITAL_COMMUNITY): Payer: Self-pay

## 2023-01-06 MED ORDER — DOXYCYCLINE HYCLATE 100 MG PO TABS
100.0000 mg | ORAL_TABLET | Freq: Two times a day (BID) | ORAL | 0 refills | Status: AC
  Filled 2023-01-06: qty 20, 10d supply, fill #0

## 2023-01-07 ENCOUNTER — Other Ambulatory Visit (HOSPITAL_COMMUNITY): Payer: Self-pay

## 2023-01-07 ENCOUNTER — Encounter (INDEPENDENT_AMBULATORY_CARE_PROVIDER_SITE_OTHER): Payer: Self-pay | Admitting: Family Medicine

## 2023-01-07 ENCOUNTER — Encounter: Payer: Self-pay | Admitting: Oncology

## 2023-01-07 ENCOUNTER — Ambulatory Visit (INDEPENDENT_AMBULATORY_CARE_PROVIDER_SITE_OTHER): Payer: 59 | Admitting: Family Medicine

## 2023-01-07 VITALS — BP 113/71 | HR 90 | Temp 97.6°F | Ht 64.0 in | Wt 254.0 lb

## 2023-01-07 DIAGNOSIS — Z1331 Encounter for screening for depression: Secondary | ICD-10-CM

## 2023-01-07 DIAGNOSIS — R7303 Prediabetes: Secondary | ICD-10-CM | POA: Diagnosis not present

## 2023-01-07 DIAGNOSIS — Z6841 Body Mass Index (BMI) 40.0 and over, adult: Secondary | ICD-10-CM | POA: Diagnosis not present

## 2023-01-07 DIAGNOSIS — R0602 Shortness of breath: Secondary | ICD-10-CM | POA: Diagnosis not present

## 2023-01-07 DIAGNOSIS — R5383 Other fatigue: Secondary | ICD-10-CM

## 2023-01-07 DIAGNOSIS — E559 Vitamin D deficiency, unspecified: Secondary | ICD-10-CM | POA: Diagnosis not present

## 2023-01-07 DIAGNOSIS — F32A Depression, unspecified: Secondary | ICD-10-CM | POA: Diagnosis not present

## 2023-01-07 DIAGNOSIS — E7849 Other hyperlipidemia: Secondary | ICD-10-CM | POA: Diagnosis not present

## 2023-01-07 DIAGNOSIS — E538 Deficiency of other specified B group vitamins: Secondary | ICD-10-CM | POA: Diagnosis not present

## 2023-01-07 MED ORDER — WEGOVY 0.5 MG/0.5ML ~~LOC~~ SOAJ
0.5000 mg | SUBCUTANEOUS | 0 refills | Status: DC
Start: 2023-01-07 — End: 2023-06-03
  Filled 2023-01-07 – 2023-04-14 (×2): qty 2, 28d supply, fill #0

## 2023-01-07 NOTE — Progress Notes (Signed)
Chief Complaint:   OBESITY Deanna Schwartz (MR# [id number]) is a 42 y.o. female who presents for evaluation and treatment of obesity and related comorbidities. Current BMI is Body mass index is 43.6 kg/m. Deanna Schwartz has been struggling with her weight for many years and has been unsuccessful in either losing weight, maintaining weight loss, or reaching her healthy weight goal.  Returning patient from December 2022.  She underwent hysterectomy in January 2024 for iron deficiency anemia and her ovaries were left.  She stopped driving for a bit due to phobia.  Ready to recommit to program.  Works from home M-F 8-5 and can take break for 1 hour for lunch. Living at home with husband Deanna Schwartz), daughter Deanna Schwartz 64), son Deanna Schwartz, 75) and daughter (Deanna Schwartz, 64).  She has recently started walking on her lunch break.  Lost pregnancy weight after each pregnancy.  She voices she is heavier now than when she pregnant with her kids. Skips breakfast daily mostly due to lack of desire for breakfast.  Coffee in the am with half/half (1 tbsp) and sugar (1 tsp).  Around 10:30/11 she gets hungry and eats a wrap with [place] slices with 2 eggs and cheese or tuna fish (1 packet) with lettuce and tomato (satisfied).  May have another cup of coffee.  Dinner is rice (1/2cup), beans (1/2cup), protein (1 chicken breast or 2 pieces of salmon) and a salad.  If she gets hungry she will have a bowl of corn flakes and 1%milk (1.5 cups cereal, 1 cup milk, 1/4 cup cocoa).  Deanna Schwartz is currently in the action stage of change and ready to dedicate time achieving and maintaining a healthier weight. Deanna Schwartz is interested in becoming our patient and working on intensive lifestyle modifications including (but not limited to) diet and exercise for weight loss.  Deanna Schwartz's habits were reviewed today and are as follows: Her family eats meals together, she thinks her family will eat healthier with her, her desired weight loss is 89 lbs,  she has been heavy most of her life, she started gaining weight in her 30's, her heaviest weight ever was now at 254 pounds, she skips meals frequently, she is frequently drinking liquids with calories, she frequently makes poor food choices, she frequently eats larger portions than normal, and she struggles with emotional eating.  Depression Screen Aspasia's Food and Mood (modified PHQ-9) score was 8.  Subjective:   1. Other fatigue Deanna Schwartz admits to daytime somnolence and admits to waking up still tired. Patient has a history of symptoms of daytime fatigue and morning fatigue. Deanna Schwartz generally gets 6 or 7 hours of sleep per night, and states that she has nightime awakenings and generally restful sleep. Snoring is present. Apneic episodes are not present. Epworth Sleepiness Score is 11.  EKG, 12/03/2022, NSR at 83 bpm.  2. SOBOE (shortness of breath on exertion) Anyjah notes increasing shortness of breath with exercising and seems to be worsening over time with weight gain. She notes getting out of breath sooner with activity than she used to. This has not gotten worse recently. Deanna Schwartz denies shortness of breath at rest or orthopnea.  3. Vitamin D deficiency Patient is currently on a multivitamin, but is positive for fatigue.    4. Vitamin B12 deficiency Patient last labs were low.  Patient is on a multivitamin.  5. Other hyperlipidemia Recent elevation of LDL.  Patient is not on any medication.  6. Prediabetes Last A1c, WNL.  Patient is not on any medications.  Assessment/Plan:   1. Other fatigue Nikala does feel that her weight is causing her energy to be lower than it should be. Fatigue may be related to obesity, depression or many other causes. Labs will be ordered, and in the meanwhile, Deanna Schwartz will focus on self care including making healthy food choices, increasing physical activity and focusing on stress reduction.  Check IC, labs today.  - Folate - T3 - T4,  free - TSH  2. SOBOE (shortness of breath on exertion) Deanna Schwartz does feel that she gets out of breath more easily that she used to when she exercises. Deanna Schwartz's shortness of breath appears to be obesity related and exercise induced. She has agreed to work on weight loss and gradually increase exercise to treat her exercise induced shortness of breath. Will continue to monitor closely.   3. Vitamin D deficiency Check labs today.  - VITAMIN D 25 Hydroxy (Vit-D Deficiency, Fractures)  4. Vitamin B12 deficiency Check labs today.  - Vitamin B12  5. Other hyperlipidemia Check labs today.  - Lipid Panel With LDL/HDL Ratio  6. Prediabetes Check labs today.  - Insulin, random  7. Depression screening Deanna Schwartz had a positive depression screening. Depression is commonly associated with obesity and often results in emotional eating behaviors. We will monitor this closely and work on CBT to help improve the non-hunger eating patterns. Referral to Psychology may be required if no improvement is seen as she continues in our clinic.  8. BMI 40.0-44.9, adult (HCC)  9. Class 3 severe obesity with serious comorbidity and body mass index (BMI) of 40.0 to 44.9 in adult, unspecified obesity type (HCC) Refill- Semaglutide-Weight Management (WEGOVY) 0.5 MG/0.5ML SOAJ; Inject 0.5 mg into the skin once a week.  Dispense: 2 mL; Refill: 0  Deanna Schwartz is currently in the action stage of change and her goal is to continue with weight loss efforts. I recommend Deanna Schwartz begin the structured treatment plan as follows:  She has agreed to the Category 3 Plan.  Exercise goals: No exercise has been prescribed at this time.   Behavioral modification strategies: increasing lean protein intake, meal planning and cooking strategies, keeping healthy foods in the home, and planning for success.  She was informed of the importance of frequent follow-up visits to maximize her success with intensive lifestyle  modifications for her multiple health conditions. She was informed we would discuss her lab results at her next visit unless there is a critical issue that needs to be addressed sooner. Deanna Schwartz agreed to keep her next visit at the agreed upon time to discuss these results.  Objective:   Blood pressure 113/71, pulse 90, temperature 97.6 F (36.4 C), height 5\' 4"  (1.626 m), weight 254 lb (115.2 kg), last menstrual period 09/13/2022, SpO2 98 %. Body mass index is 43.6 kg/m.  EKG: Normal sinus rhythm, rate 83 bpm.  Indirect Calorimeter completed today shows a VO2 of 258 and a REE of 1786.  Her calculated basal metabolic rate is 7829 thus her basal metabolic rate is worse than expected.  General: Cooperative, alert, well developed, in no acute distress. HEENT: Conjunctivae and lids unremarkable. Cardiovascular: Regular rhythm.  Lungs: Normal work of breathing. Neurologic: No focal deficits.   Lab Results  Component Value Date   CREATININE 0.83 12/30/2022   BUN 13 12/30/2022   NA 137 12/30/2022   K 4.0 12/30/2022   CL 102 12/30/2022   CO2 29 12/30/2022   Lab Results  Component Value Date   ALT 15 12/30/2022  AST 16 12/30/2022   ALKPHOS 55 12/30/2022   BILITOT 0.4 12/30/2022   Lab Results  Component Value Date   HGBA1C 5.4 12/30/2022   HGBA1C 5.6 07/23/2021   HGBA1C 5.6 01/25/2021   HGBA1C 5.7 09/17/2020   HGBA1C 5.7 12/16/2016   Lab Results  Component Value Date   INSULIN 13.0 01/07/2023   INSULIN 10.4 07/23/2021   INSULIN 13.0 08/26/2016   Lab Results  Component Value Date   TSH 1.670 01/07/2023   Lab Results  Component Value Date   CHOL 201 (H) 01/07/2023   HDL 50 01/07/2023   LDLCALC 120 (H) 01/07/2023   LDLDIRECT 105.0 04/21/2022   TRIG 174 (H) 01/07/2023   CHOLHDL 4 08/15/2022   Lab Results  Component Value Date   WBC 7.3 12/30/2022   HGB 13.2 12/30/2022   HCT 38.9 12/30/2022   MCV 88.8 12/30/2022   PLT 247.0 12/30/2022   Lab Results  Component  Value Date   IRON 117 12/30/2022   TIBC 316.4 12/30/2022   FERRITIN 147.8 12/30/2022   Attestation Statements:   Reviewed by clinician on day of visit: allergies, medications, problem list, medical history, surgical history, family history, social history, and previous encounter notes.  Time spent on visit including pre-visit chart review and post-visit charting and care was 45 minutes.   I, Malcolm Metro, RMA, am acting as transcriptionist for Reuben Likes, MD.  This is the patient's first visit at [facility]. The patient's NEW PATIENT PACKET was reviewed at length. Included in the packet: current and past health history, medications, allergies, ROS, gynecologic history (women only), surgical history, family history, social history, weight history, weight loss surgery history (for those that have had weight loss surgery), nutritional evaluation, mood and food questionnaire, PHQ9, Epworth questionnaire, sleep habits questionnaire, patient life and health improvement goals questionnaire. These will all be scanned into the patient's chart under media.   During the visit, I independently reviewed the patient's EKG, bioimpedance scale results, and indirect calorimeter results. I used this information to tailor a meal plan for the patient that will help her to lose weight and will improve her obesity-related conditions going forward. I performed a medically necessary appropriate examination and/or evaluation. I discussed the assessment and treatment plan with the patient. The patient was provided an opportunity to ask questions and all were answered. The patient agreed with the plan and demonstrated an understanding of the instructions. Labs were ordered at this visit and will be reviewed at the next visit unless more critical results need to be addressed immediately. Clinical information was updated and documented in the EMR.   I have reviewed the above documentation for accuracy  and completeness, and I agree with the above. - Reuben Likes, MD

## 2023-01-08 ENCOUNTER — Encounter: Payer: 59 | Admitting: Internal Medicine

## 2023-01-08 LAB — T4, FREE: Free T4: 1.16 ng/dL (ref 0.82–1.77)

## 2023-01-08 LAB — FOLATE: Folate: 10.4 ng/mL (ref >3.0)

## 2023-01-08 LAB — TSH: TSH: 1.67 u[IU]/mL (ref 0.450–4.500)

## 2023-01-08 LAB — VITAMIN D 25 HYDROXY (VIT D DEFICIENCY, FRACTURES): Vit D, 25-Hydroxy: 31.8 ng/mL (ref 30.0–100.0)

## 2023-01-08 LAB — LIPID PANEL WITH LDL/HDL RATIO
Cholesterol, Total: 201 mg/dL — ABNORMAL HIGH (ref 100–199)
HDL: 50 mg/dL (ref >39)
LDL Chol Calc (NIH): 120 mg/dL — ABNORMAL HIGH (ref 0–99)
LDL/HDL Ratio: 2.4 ratio (ref 0.0–3.2)
Triglycerides: 174 mg/dL — ABNORMAL HIGH (ref 0–149)
VLDL Cholesterol Cal: 31 mg/dL (ref 5–40)

## 2023-01-08 LAB — VITAMIN B12: Vitamin B-12: 305 pg/mL (ref 232–1245)

## 2023-01-08 LAB — T3: T3, Total: 125 ng/dL (ref 71–180)

## 2023-01-08 LAB — INSULIN, RANDOM: INSULIN: 13 u[IU]/mL (ref 2.6–24.9)

## 2023-01-21 ENCOUNTER — Ambulatory Visit (INDEPENDENT_AMBULATORY_CARE_PROVIDER_SITE_OTHER): Payer: 59 | Admitting: Family Medicine

## 2023-01-21 ENCOUNTER — Other Ambulatory Visit: Payer: Self-pay | Admitting: *Deleted

## 2023-01-21 DIAGNOSIS — D500 Iron deficiency anemia secondary to blood loss (chronic): Secondary | ICD-10-CM

## 2023-01-23 ENCOUNTER — Inpatient Hospital Stay: Payer: 59 | Admitting: Adult Health

## 2023-01-23 ENCOUNTER — Inpatient Hospital Stay: Payer: 59 | Attending: Adult Health

## 2023-02-05 ENCOUNTER — Other Ambulatory Visit: Payer: Self-pay | Admitting: Obstetrics and Gynecology

## 2023-02-05 DIAGNOSIS — D242 Benign neoplasm of left breast: Secondary | ICD-10-CM

## 2023-02-06 ENCOUNTER — Encounter: Payer: Self-pay | Admitting: Internal Medicine

## 2023-02-09 ENCOUNTER — Telehealth: Payer: Self-pay | Admitting: Adult Health

## 2023-02-09 ENCOUNTER — Ambulatory Visit
Admission: EM | Admit: 2023-02-09 | Discharge: 2023-02-09 | Disposition: A | Discharge status: Home / Self Care | Payer: 59 | Attending: Urgent Care | Admitting: Urgent Care

## 2023-02-09 DIAGNOSIS — J988 Other specified respiratory disorders: Secondary | ICD-10-CM | POA: Diagnosis not present

## 2023-02-09 DIAGNOSIS — J309 Allergic rhinitis, unspecified: Secondary | ICD-10-CM

## 2023-02-09 DIAGNOSIS — J4540 Moderate persistent asthma, uncomplicated: Secondary | ICD-10-CM

## 2023-02-09 DIAGNOSIS — B9789 Other viral agents as the cause of diseases classified elsewhere: Secondary | ICD-10-CM

## 2023-02-09 MED ORDER — PROMETHAZINE-DM 6.25-15 MG/5ML PO SYRP: 5.0000 mL | ORAL_SOLUTION | Freq: Three times a day (TID) | ORAL | 0 refills | Status: DC | PRN

## 2023-02-09 MED ORDER — PREDNISONE 50 MG PO TABS: 50.0000 mg | ORAL_TABLET | Freq: Every day | ORAL | 0 refills | Status: DC

## 2023-02-09 MED ORDER — LEVOCETIRIZINE DIHYDROCHLORIDE 5 MG PO TABS: 5.0000 mg | ORAL_TABLET | Freq: Every evening | ORAL | 0 refills | Status: DC

## 2023-02-09 NOTE — Telephone Encounter (Signed)
Patient left a voicemail on 5/31 wanting to reschedule cancelled appointment. Had called twice, and left a message in regards to new appointment times/dates.

## 2023-02-09 NOTE — ED Provider Notes (Signed)
[facility]  Note:  This document was prepared using Conservation officer, historic buildings and may include unintentional dictation errors.  MRN: [id number] DOB: 1981/07/23  Subjective:   Deanna Schwartz is a 42 y.o. female presenting for 2-day history of persistent shortness of breath, wheezing, chest tightness with intermittent chest pain, congestion and left ear fullness.  Patient has a history of moderate persistent asthma, takes Symbicort daily.  Does not need refills on her inhaler.  No smoking of any kind including cigarettes, cigars, vaping, marijuana use.    No current facility-administered medications for this encounter.  Current Outpatient Medications:    albuterol (VENTOLIN HFA) 108 (90 Base) MCG/ACT inhaler, Inhale 1-2 puffs into the lungs every 6 (six) hours as needed for wheezing or shortness of breath, Disp: 6.7 g, Rfl: 2   budesonide-formoterol (SYMBICORT) 160-4.5 MCG/ACT inhaler, Inhale 2 puffs into the lungs 2 (two) times daily., Disp: 30.6 g, Rfl: 1   cetirizine (ZYRTEC ALLERGY) 10 MG tablet, Take 1 tablet (10 mg total) by mouth at bedtime. (Patient taking differently: Take 10 mg by mouth daily.), Disp: 30 tablet, Rfl: 2   EPINEPHrine (EPIPEN 2-PAK) 0.3 mg/0.3 mL IJ SOAJ injection, Inject 1 pen into the muscle as needed for anaphylaxis., Disp: 2 each, Rfl: 3   meclizine (ANTIVERT) 12.5 MG tablet, Take 1-2 tablets (12.5-25 mg total) by mouth 3 (three) times daily as needed for dizziness., Disp: 30 tablet, Rfl: 1   montelukast (SINGULAIR) 10 MG tablet, Take 1 tablet (10 mg total) by mouth at bedtime., Disp: 90 tablet, Rfl: 3   Prenatal Vit-Fe Fumarate-FA (PRENATAL VITAMINS PO), Take 1 tablet by mouth daily., Disp: , Rfl:    Semaglutide-Weight Management (WEGOVY) 0.5 MG/0.5ML SOAJ, Inject 0.5 mg into the skin once a week., Disp: 2 mL, Rfl: 0   sertraline (ZOLOFT) 50 MG tablet, Take 1 tablet (50 mg total) by mouth daily. (Patient taking differently: Take  25 mg by mouth daily.), Disp: 90 tablet, Rfl: 3   Allergies  Allergen Reactions   Augmentin [Amoxicillin-Pot Clavulanate] Rash   Cephalexin Rash   Iodine Swelling and Rash    Throat swelling after eating shrimp. Patient states that she is no longer allergic to iodine or shrimp as of 09/16/22.    Past Medical History:  Diagnosis Date   Allergic rhinitis    Anemia    Anxiety    improved per pt on 09/14/22   Asthma    Follows w/ [facility], Dr. Barnetta Chapel. Patient states last asthma exacerbation was in early December 2023. States she is breathing well as of 09/16/22.   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 pylori infection 2008   Hx of tx + serology   High cholesterol    IBS (irritable bowel syndrome)    Iron deficiency anemia 08/06/2011   Hx of multiple iron infusions, most recent in 08/2022, per pt she is scheduled for an infusion on 09/19/22   Irritable bowel syndrome    Knee pain    Obesity    Follows with [facility].   Palpitations 04/2021   Echo on 06/04/21 EF 55 -60%  Panic attack    Prediabetes    many years ago per pt, labs improved as of 2024   Shortness of breath    w/ asthma exacerbations   Ulcer    Vitamin D deficiency    resolved per pt 2024     Past Surgical History:  Procedure Laterality Date   ABDOMINAL HYSTERECTOMY     CYSTOSCOPY N/A 09/22/2022   Procedure: CYSTOSCOPY;  Surgeon: Sherian Rein, MD;  Location: [facility];  Service: Gynecology;  Laterality: N/A;   FLEXIBLE SIGMOIDOSCOPY  04/14/2008   normal   NASAL SINUS SURGERY  2012   ROBOTIC ASSISTED LAPAROSCOPIC HYSTERECTOMY AND SALPINGECTOMY Bilateral 09/22/2022   Procedure: XI ROBOTIC ASSISTED  LAPAROSCOPIC HYSTERECTOMY AND SALPINGECTOMY;  Surgeon: Sherian Rein, MD;  Location:  SURGERY CENTER;  Service: Gynecology;  Laterality: Bilateral;   UPPER GASTROINTESTINAL ENDOSCOPY     around 2014    Family History  Problem Relation Age of Onset   Hyperlipidemia Mother    Hypertension Mother    Anxiety disorder Mother    Obesity Mother    Cancer Father    Hyperlipidemia Father    Hypertension Father    Diabetes Paternal Grandmother    Diabetes Maternal Aunt    Heart attack Maternal Aunt        Strokes (died of a heart attack)   Diabetes Maternal Aunt    Colon polyps Paternal Uncle        x4   Diabetes Other        grandmother   Hypertension Other    Asthma Maternal Aunt    Colon cancer Neg Hx     Social History   Tobacco Use   Smoking status: Never   Smokeless tobacco: Never  Vaping Use   Vaping Use: Never used  Substance Use Topics   Alcohol use: Yes    Alcohol/week: 7.0 standard drinks of alcohol    Types: 7 Glasses of wine per week   Drug use: No    ROS   Objective:   Vitals: BP 129/77 (BP Location: Left Arm)   Pulse 99   Temp 98.8 F (37.1 C) (Oral)   Resp 18   LMP 09/13/2022 (Exact Date)   SpO2 97%   Physical Exam Constitutional:      General: She is not in acute distress.    Appearance: Normal appearance. She is well-developed and normal weight. She is not ill-appearing, toxic-appearing or diaphoretic.  HENT:     Head: Normocephalic and atraumatic.     Right Ear: Tympanic membrane, ear canal and external ear normal. No drainage or tenderness. No middle ear effusion. There is no impacted cerumen. Tympanic membrane is not erythematous or bulging.     Left Ear: Tympanic membrane, ear canal and external ear normal. No drainage or tenderness.  No middle ear effusion. There is no impacted cerumen. Tympanic membrane is not erythematous or bulging.     Nose: Nose normal. No congestion or rhinorrhea.     Mouth/Throat:     Mouth:  Mucous membranes are moist. No oral lesions.     Pharynx: No pharyngeal swelling, oropharyngeal exudate, posterior oropharyngeal erythema or uvula swelling.     Tonsils: No tonsillar exudate or tonsillar abscesses.  Eyes:     General: No scleral icterus.       Right eye: No discharge.        Left eye: No discharge.     Extraocular Movements: Extraocular movements intact.  Right eye: Normal extraocular motion.     Left eye: Normal extraocular motion.     Conjunctiva/sclera: Conjunctivae normal.  Cardiovascular:     Rate and Rhythm: Normal rate and regular rhythm.     Heart sounds: Normal heart sounds. No murmur heard.    No friction rub. No gallop.  Pulmonary:     Effort: Pulmonary effort is normal. No respiratory distress.     Breath sounds: No stridor. Wheezing (toward the end of inspiration and expiration) present. No rhonchi or rales.  Chest:     Chest wall: No tenderness.  Musculoskeletal:     Cervical back: Normal range of motion and neck supple.  Lymphadenopathy:     Cervical: No cervical adenopathy.  Skin:    General: Skin is warm and dry.  Neurological:     General: No focal deficit present.     Mental Status: She is alert and oriented to person, place, and time.  Psychiatric:        Mood and Affect: Mood normal.        Behavior: Behavior normal.     Assessment and Plan :   PDMP not reviewed this encounter.  1. Viral respiratory illness   2. Moderate persistent asthma without complication   3. Allergic rhinitis, unspecified seasonality, unspecified trigger    Patient does not need refills on her medication.  Recommended an oral prednisone course, starting Xyzal daily.  Will defer chest x-ray for now.  Counseled patient on potential for adverse effects with medications prescribed/recommended today, ER and return-to-clinic precautions discussed, patient verbalized understanding.    Wallis Bamberg, PA-C 02/09/23 1501

## 2023-02-09 NOTE — ED Triage Notes (Signed)
Pt reports wheezing and shortness of breath x 2 days. States she is having asthma flare up.

## 2023-02-10 ENCOUNTER — Ambulatory Visit: Payer: 59 | Admitting: Internal Medicine

## 2023-02-24 ENCOUNTER — Other Ambulatory Visit (HOSPITAL_COMMUNITY): Payer: Self-pay

## 2023-02-24 ENCOUNTER — Other Ambulatory Visit: Payer: Self-pay

## 2023-02-24 ENCOUNTER — Ambulatory Visit (INDEPENDENT_AMBULATORY_CARE_PROVIDER_SITE_OTHER): Payer: 59 | Admitting: Family Medicine

## 2023-02-24 ENCOUNTER — Encounter (INDEPENDENT_AMBULATORY_CARE_PROVIDER_SITE_OTHER): Payer: Self-pay | Admitting: Family Medicine

## 2023-02-24 VITALS — BP 130/77 | HR 101 | Temp 98.3°F | Ht 64.0 in | Wt 254.0 lb

## 2023-02-24 DIAGNOSIS — E559 Vitamin D deficiency, unspecified: Secondary | ICD-10-CM

## 2023-02-24 DIAGNOSIS — E88819 Insulin resistance, unspecified: Secondary | ICD-10-CM

## 2023-02-24 DIAGNOSIS — Z6841 Body Mass Index (BMI) 40.0 and over, adult: Secondary | ICD-10-CM | POA: Diagnosis not present

## 2023-02-24 DIAGNOSIS — E7849 Other hyperlipidemia: Secondary | ICD-10-CM | POA: Diagnosis not present

## 2023-02-24 DIAGNOSIS — E669 Obesity, unspecified: Secondary | ICD-10-CM

## 2023-02-24 DIAGNOSIS — R1011 Right upper quadrant pain: Secondary | ICD-10-CM

## 2023-02-24 MED ORDER — VITAMIN D (ERGOCALCIFEROL) 1.25 MG (50000 UNIT) PO CAPS
50000.0000 [IU] | ORAL_CAPSULE | ORAL | 0 refills | Status: DC
Start: 2023-02-24 — End: 2023-04-23
  Filled 2023-02-24 (×2): qty 4, 28d supply, fill #0

## 2023-02-24 NOTE — Progress Notes (Signed)
Chief Complaint:   OBESITY Deanna Schwartz is here to discuss her progress with her obesity treatment plan along with follow-up of her obesity related diagnoses. Deanna Schwartz is on the Category 3 Plan and states she is following her eating plan approximately 50% of the time. Deanna Schwartz states she is walking for 15-30 minutes 5 times per week.  Today's visit was #: 2 Starting weight: 254 lbs Starting date: 01/07/2023 Today's weight: 254 lbs Today's date: 02/24/2023 Total lbs lost to date: 0 Total lbs lost since last in-office visit: 0  Interim History: Patient presents for first follow up.  Her first appointment was early May and she needed to reschedule this appointment due to familiar commitments.  She started the [facility] and has been walking everyday.  She has had lots of events.  Recognizes she eats at night, indulges in alcohol and poor sleep were some saboteurs she has dealt with.  Breakfast has been on plan consistently.  She is doing egg and [place] and cheese in the am.  Lunch is salad with chicken or chicken skewers with salad.  Nighttime tends to be difficult.  She is not able to get all protein in at supper.  Next few weeks she only has baseball games with her kids.  For 4th of July she voices she will have a barbecue at home. She has noticed appetite decrease with the 0.25mg  of Wegovy.  Subjective:   1. Vitamin D deficiency Patient is not on prescription vitamin D.  She denies nausea, vomiting, or muscle weakness.  Her recent vitamin D level was of 31.8.  2. Other hyperlipidemia Patient's recent LDL was 120, HDL 50, and triglycerides 161.  She is not on medications.  3. Insulin resistance Patient's recent A1c was 5.4 and insulin 13.0.  She is on [place] currently.  She still notes carbohydrate cravings with late-night snacks and alcohol.  4. RUQ pain Patient notes right upper quadrant pain.  Assessment/Plan:   1. Vitamin D deficiency Patient agreed to start prescription vitamin D  50,000 IU once weekly with no refills.  We will retest in 3 months.  - Vitamin D, Ergocalciferol, (DRISDOL) 1.25 MG (50000 UNIT) CAPS capsule; Take 1 capsule (50,000 Units total) by mouth every 7 (seven) days.  Dispense: 4 capsule; Refill: 0  2. Other hyperlipidemia Patient will continue her category 3 meal plan, no medications at this time.  3. Insulin resistance Patient will continue Wegovy, and she will need repeat labs in 3 months.  4. RUQ pain We will check labs today, we will follow-up at patient's next appointment.  - Comprehensive metabolic panel  5. BMI 40.0-44.9, adult (HCC)  6. Obesity, starting BMI 43.60 Deanna Schwartz is currently in the action stage of change. As such, her goal is to continue with weight loss efforts. She has agreed to the Category 3 Plan.   Patient is to look at other protein substitutions for meat at dinner.  Exercise goals: All adults should avoid inactivity. Some physical activity is better than none, and adults who participate in any amount of physical activity gain some health benefits.    Behavioral modification strategies: increasing lean protein intake, meal planning and cooking strategies, keeping healthy foods in the home, and planning for success.  Deanna Schwartz has agreed to follow-up with our clinic in 3 weeks. She was informed of the importance of frequent follow-up visits to maximize her success with intensive lifestyle modifications for her multiple health conditions.   Objective:   Blood pressure 130/77, pulse [name])  101, temperature 98.3 F (36.8 C), height 5\' 4"  (1.626 m), weight 254 lb (115.2 kg), last menstrual period 09/13/2022, SpO2 99 %. Body mass index is 43.6 kg/m.  General: Cooperative, alert, well developed, in no acute distress. HEENT: Conjunctivae and lids unremarkable. Cardiovascular: Regular rhythm.  Lungs: Normal work of breathing. Neurologic: No focal deficits.   Lab Results  Component Value Date   CREATININE 0.83  12/30/2022   BUN 13 12/30/2022   NA 137 12/30/2022   K 4.0 12/30/2022   CL 102 12/30/2022   CO2 29 12/30/2022   Lab Results  Component Value Date   ALT 15 12/30/2022   AST 16 12/30/2022   ALKPHOS 55 12/30/2022   BILITOT 0.4 12/30/2022   Lab Results  Component Value Date   HGBA1C 5.4 12/30/2022   HGBA1C 5.6 07/23/2021   HGBA1C 5.6 01/25/2021   HGBA1C 5.7 09/17/2020   HGBA1C 5.7 12/16/2016   Lab Results  Component Value Date   INSULIN 13.0 01/07/2023   INSULIN 10.4 07/23/2021   INSULIN 13.0 08/26/2016   Lab Results  Component Value Date   TSH 1.670 01/07/2023   Lab Results  Component Value Date   CHOL 201 (H) 01/07/2023   HDL 50 01/07/2023   LDLCALC 120 (H) 01/07/2023   LDLDIRECT 105.0 04/21/2022   TRIG 174 (H) 01/07/2023   CHOLHDL 4 08/15/2022   Lab Results  Component Value Date   VD25OH 31.8 01/07/2023   VD25OH 27.03 (L) 04/21/2022   VD25OH 52.2 07/23/2021   Lab Results  Component Value Date   WBC 7.3 12/30/2022   HGB 13.2 12/30/2022   HCT 38.9 12/30/2022   MCV 88.8 12/30/2022   PLT 247.0 12/30/2022   Lab Results  Component Value Date   IRON 117 12/30/2022   TIBC 316.4 12/30/2022   FERRITIN 147.8 12/30/2022   Attestation Statements:   Reviewed by clinician on day of visit: allergies, medications, problem list, medical history, surgical history, family history, social history, and previous encounter notes.  Total time on pre visit chart review, face to face with the patient and post visit charting was 45 minutes  I, Burt Knack, am acting as transcriptionist for Reuben Likes, MD.  I have reviewed the above documentation for accuracy and completeness, and I agree with the above. - Reuben Likes, MD

## 2023-02-25 LAB — COMPREHENSIVE METABOLIC PANEL
ALT: 23 IU/L (ref 0–32)
AST: 23 IU/L (ref 0–40)
Albumin: 4.2 g/dL (ref 3.9–4.9)
Alkaline Phosphatase: 69 IU/L (ref 44–121)
BUN/Creatinine Ratio: 14 (ref 9–23)
BUN: 11 mg/dL (ref 6–24)
Bilirubin Total: <0.2 mg/dL (ref 0.0–1.2)
CO2: 24 mmol/L (ref 20–29)
Calcium: 9.2 mg/dL (ref 8.7–10.2)
Chloride: 102 mmol/L (ref 96–106)
Creatinine, Ser: 0.77 mg/dL (ref 0.57–1.00)
Globulin, Total: 2.9 g/dL (ref 1.5–4.5)
Glucose: 82 mg/dL (ref 70–99)
Potassium: 4.5 mmol/L (ref 3.5–5.2)
Sodium: 139 mmol/L (ref 134–144)
Total Protein: 7.1 g/dL (ref 6.0–8.5)
eGFR: 99 mL/min/{1.73_m2} (ref >59)

## 2023-02-27 ENCOUNTER — Inpatient Hospital Stay: Payer: 59 | Attending: Adult Health

## 2023-02-27 ENCOUNTER — Inpatient Hospital Stay: Payer: 59 | Admitting: Adult Health

## 2023-03-19 ENCOUNTER — Ambulatory Visit (INDEPENDENT_AMBULATORY_CARE_PROVIDER_SITE_OTHER): Payer: 59 | Admitting: Family Medicine

## 2023-03-23 ENCOUNTER — Other Ambulatory Visit: Payer: 59

## 2023-03-25 ENCOUNTER — Ambulatory Visit (INDEPENDENT_AMBULATORY_CARE_PROVIDER_SITE_OTHER): Payer: 59 | Admitting: Family Medicine

## 2023-04-01 ENCOUNTER — Encounter: Payer: Self-pay | Admitting: Internal Medicine

## 2023-04-01 ENCOUNTER — Telehealth: Payer: Self-pay | Admitting: Internal Medicine

## 2023-04-01 NOTE — Telephone Encounter (Signed)
Error

## 2023-04-01 NOTE — Telephone Encounter (Signed)
Patient states chest discomfort for a few days.  She states on the left side and not sure if it is due to asthma or exercising. Shoulder and arm area. No weakness not tingling. No nausea or sweating.  No heartburn.  SOB due to asthma and has not increased. Soonest appr for next week made.  Advised if pain increases and radiates to jaw and/or accompanied by sweating, nausea or increased SOB then go to ED.  Also advised if it resolves due to being asthma exacerbation or from working out, then call and cancel appt.  She states understanding.

## 2023-04-01 NOTE — Telephone Encounter (Signed)
Pt c/o of Chest Pain: STAT if active CP, including tightness, pressure, jaw pain, radiating pain to shoulder/upper arm/back, CP unrelieved by Nitro. Symptoms reported of SOB, nausea, vomiting, sweating.  1. Are you having CP right now? No     2. Are you experiencing any other symptoms (ex. SOB, nausea, vomiting, sweating)? Sob but I have asthma    3. Is your CP continuous or coming and going? coming and going    4. Have you taken Nitroglycerin? No    5. How long have you been experiencing CP? About 2 days it's more like a discomfort     6. If NO CP at time of call then end call with telling Pt to call back or call 911 if Chest pain returns prior to return call from triage team.   Answers received via patient schedule. Please advise.

## 2023-04-08 ENCOUNTER — Encounter (INDEPENDENT_AMBULATORY_CARE_PROVIDER_SITE_OTHER): Payer: Self-pay

## 2023-04-09 ENCOUNTER — Ambulatory Visit: Payer: 59 | Attending: Physician Assistant | Admitting: Physician Assistant

## 2023-04-09 NOTE — Progress Notes (Signed)
This encounter was created in error - please disregard.

## 2023-04-14 ENCOUNTER — Other Ambulatory Visit: Payer: Self-pay

## 2023-04-15 ENCOUNTER — Encounter (INDEPENDENT_AMBULATORY_CARE_PROVIDER_SITE_OTHER): Payer: Self-pay | Admitting: Family Medicine

## 2023-04-15 ENCOUNTER — Ambulatory Visit (INDEPENDENT_AMBULATORY_CARE_PROVIDER_SITE_OTHER): Payer: 59 | Admitting: Family Medicine

## 2023-04-23 ENCOUNTER — Ambulatory Visit
Admission: RE | Admit: 2023-04-23 | Discharge: 2023-04-23 | Disposition: A | Discharge status: Home / Self Care | Payer: 59 | Source: Ambulatory Visit | Attending: Internal Medicine | Admitting: Internal Medicine

## 2023-04-23 ENCOUNTER — Encounter: Payer: Self-pay | Admitting: Internal Medicine

## 2023-04-23 ENCOUNTER — Other Ambulatory Visit: Payer: Self-pay

## 2023-04-23 ENCOUNTER — Ambulatory Visit: Payer: 59 | Admitting: Internal Medicine

## 2023-04-23 VITALS — BP 112/70 | HR 91 | Temp 98.2°F | Ht 64.0 in | Wt 259.0 lb

## 2023-04-23 DIAGNOSIS — J4531 Mild persistent asthma with (acute) exacerbation: Secondary | ICD-10-CM

## 2023-04-23 DIAGNOSIS — R1031 Right lower quadrant pain: Secondary | ICD-10-CM

## 2023-04-23 DIAGNOSIS — R635 Abnormal weight gain: Secondary | ICD-10-CM

## 2023-04-23 DIAGNOSIS — Z0000 Encounter for general adult medical examination without abnormal findings: Secondary | ICD-10-CM | POA: Diagnosis not present

## 2023-04-23 DIAGNOSIS — R0981 Nasal congestion: Secondary | ICD-10-CM

## 2023-04-23 LAB — COMPREHENSIVE METABOLIC PANEL
ALT: 21 U/L (ref 0–35)
AST: 20 U/L (ref 0–37)
Albumin: 4.3 g/dL (ref 3.5–5.2)
Alkaline Phosphatase: 57 U/L (ref 39–117)
BUN: 15 mg/dL (ref 6–23)
CO2: 30 mEq/L (ref 19–32)
Calcium: 9.6 mg/dL (ref 8.4–10.5)
Chloride: 99 mEq/L (ref 96–112)
Creatinine, Ser: 0.82 mg/dL (ref 0.40–1.20)
GFR: 88.99 mL/min (ref >60.00)
Glucose, Bld: 94 mg/dL (ref 70–99)
Potassium: 3.9 mEq/L (ref 3.5–5.1)
Sodium: 136 mEq/L (ref 135–145)
Total Bilirubin: 0.4 mg/dL (ref 0.2–1.2)
Total Protein: 7.7 g/dL (ref 6.0–8.3)

## 2023-04-23 LAB — CBC WITH DIFFERENTIAL/PLATELET
Basophils Absolute: 0 10*3/uL (ref 0.0–0.1)
Basophils Relative: 0.4 % (ref 0.0–3.0)
Eosinophils Absolute: 0.7 10*3/uL (ref 0.0–0.7)
Eosinophils Relative: 7.4 % — ABNORMAL HIGH (ref 0.0–5.0)
HCT: 41.2 % (ref 36.0–46.0)
Hemoglobin: 13.5 g/dL (ref 12.0–15.0)
Lymphocytes Relative: 23.6 % (ref 12.0–46.0)
Lymphs Abs: 2.1 10*3/uL (ref 0.7–4.0)
MCHC: 32.7 g/dL (ref 30.0–36.0)
MCV: 90 fl (ref 78.0–100.0)
Monocytes Absolute: 0.5 10*3/uL (ref 0.1–1.0)
Monocytes Relative: 6 % (ref 3.0–12.0)
Neutro Abs: 5.6 10*3/uL (ref 1.4–7.7)
Neutrophils Relative %: 62.6 % (ref 43.0–77.0)
Platelets: 255 10*3/uL (ref 150.0–400.0)
RBC: 4.57 Mil/uL (ref 3.87–5.11)
RDW: 13.2 % (ref 11.5–15.5)
WBC: 9 10*3/uL (ref 4.0–10.5)

## 2023-04-23 LAB — URINALYSIS, ROUTINE W REFLEX MICROSCOPIC
Bilirubin Urine: NEGATIVE
Ketones, ur: NEGATIVE
Leukocytes,Ua: NEGATIVE
Nitrite: NEGATIVE
Specific Gravity, Urine: 1.02 (ref 1.000–1.030)
Total Protein, Urine: NEGATIVE
Urine Glucose: NEGATIVE
Urobilinogen, UA: 0.2 (ref 0.0–1.0)
pH: 6 (ref 5.0–8.0)

## 2023-04-23 LAB — LIPID PANEL
Cholesterol: 204 mg/dL — ABNORMAL HIGH (ref 0–200)
HDL: 45.8 mg/dL (ref >39.00)
NonHDL: 158.35
Total CHOL/HDL Ratio: 4
Triglycerides: 234 mg/dL — ABNORMAL HIGH (ref 0.0–149.0)
VLDL: 46.8 mg/dL — ABNORMAL HIGH (ref 0.0–40.0)

## 2023-04-23 LAB — POC COVID19 BINAXNOW: SARS Coronavirus 2 Ag: NEGATIVE

## 2023-04-23 LAB — LDL CHOLESTEROL, DIRECT: Direct LDL: 152 mg/dL

## 2023-04-23 LAB — TSH: TSH: 1.9 u[IU]/mL (ref 0.35–5.50)

## 2023-04-23 MED ORDER — EPINEPHRINE 0.3 MG/0.3ML IJ SOAJ
0.3000 mg | INTRAMUSCULAR | 3 refills | Status: AC | PRN
  Filled 2023-04-23: qty 2, 7d supply, fill #0
  Filled 2023-09-29: qty 2, 30d supply, fill #0

## 2023-04-23 MED ORDER — AIRSUPRA 90-80 MCG/ACT IN AERO: 2.0000 | INHALATION_SPRAY | RESPIRATORY_TRACT | 3 refills | Status: DC | PRN

## 2023-04-23 MED ORDER — IOPAMIDOL (ISOVUE-300) INJECTION 61%
500.0000 mL | Freq: Once | INTRAVENOUS | Status: AC | PRN
  Administered 2023-04-23: 100 mL via INTRAVENOUS

## 2023-04-23 NOTE — Assessment & Plan Note (Addendum)
Wt Readings from Last 3 Encounters:  04/23/23 259 lb (117.5 kg)  02/24/23 254 lb (115.2 kg)  01/07/23 254 lb (115.2 kg)  Diet discussed On Wegovy 0.5 mg/wk

## 2023-04-23 NOTE — Assessment & Plan Note (Signed)
Worse COVID(-) Start Airsupra MDI (Albuterol is not working well) On Symbicort

## 2023-04-23 NOTE — Assessment & Plan Note (Signed)
  We discussed age appropriate health related issues, including available/recomended screening tests and vaccinations. Labs were ordered to be later reviewed . All questions were answered. We discussed one or more of the following - seat belt use, use of sunscreen/sun exposure exercise, fall risk reduction, second hand smoke exposure, firearm use and storage, seat belt use, a need for adhering to healthy diet and exercise. Wt loss/diet Partial hysterectomy Jan 2024 Labs were ordered.  All questions were answered.

## 2023-04-23 NOTE — Assessment & Plan Note (Addendum)
New - worse CT abd/pelvis - r/o appendicitis, etc CBC To ER if worse

## 2023-04-23 NOTE — Progress Notes (Signed)
Subjective:  Patient ID: Deanna Schwartz, female    DOB: 01/08/1981  Age: 42 y.o. MRN: [id number]  CC: Annual Exam   HPI KERRI-ANN WILDES presents for a well exam C/o allergies Generic albuterol is not working well C/o RLQ abd pain x 2 wks - worse; can't sleep x 2 nights  Outpatient Medications Prior to Visit  Medication Sig Dispense Refill   levocetirizine (XYZAL) 5 MG tablet Take 1 tablet (5 mg total) by mouth every evening. 90 tablet 0   montelukast (SINGULAIR) 10 MG tablet Take 1 tablet (10 mg total) by mouth at bedtime. 90 tablet 3   Prenatal Vit-Fe Fumarate-FA (PRENATAL VITAMINS PO) Take 1 tablet by mouth daily.     Semaglutide-Weight Management (WEGOVY) 0.5 MG/0.5ML SOAJ Inject 0.5 mg into the skin once a week. 2 mL 0   sertraline (ZOLOFT) 50 MG tablet Take 1 tablet (50 mg total) by mouth daily. (Patient taking differently: Take 25 mg by mouth daily.) 90 tablet 3   albuterol (VENTOLIN HFA) 108 (90 Base) MCG/ACT inhaler Inhale 1-2 puffs into the lungs every 6 (six) hours as needed for wheezing or shortness of breath 6.7 g 2   EPINEPHrine (EPIPEN 2-PAK) 0.3 mg/0.3 mL IJ SOAJ injection Inject 1 pen into the muscle as needed for anaphylaxis. 2 each 3   budesonide-formoterol (SYMBICORT) 160-4.5 MCG/ACT inhaler Inhale 2 puffs into the lungs 2 (two) times daily. 30.6 g 1   cetirizine (ZYRTEC ALLERGY) 10 MG tablet Take 1 tablet (10 mg total) by mouth at bedtime. (Patient taking differently: Take 10 mg by mouth daily.) 30 tablet 2   meclizine (ANTIVERT) 12.5 MG tablet Take 1-2 tablets (12.5-25 mg total) by mouth 3 (three) times daily as needed for dizziness. 30 tablet 1   Vitamin D, Ergocalciferol, (DRISDOL) 1.25 MG (50000 UNIT) CAPS capsule Take 1 capsule (50,000 Units total) by mouth every 7 (seven) days. 4 capsule 0   No facility-administered medications prior to visit.    ROS: Review of Systems  Constitutional:  Negative for activity change, appetite change, chills, fatigue  and unexpected weight change.  HENT:  Negative for congestion, mouth sores and sinus pressure.   Eyes:  Negative for visual disturbance.  Respiratory:  Positive for cough. Negative for chest tightness.   Gastrointestinal:  Negative for abdominal pain and nausea.  Genitourinary:  Negative for difficulty urinating, frequency and vaginal pain.  Musculoskeletal:  Negative for back pain and gait problem.  Skin:  Negative for pallor and rash.  Neurological:  Negative for dizziness, tremors, weakness, numbness and headaches.  Psychiatric/Behavioral:  Negative for confusion and sleep disturbance.     Objective:  BP 112/70 (BP Location: Left Arm, Patient Position: Sitting, Cuff Size: Large)   Pulse 91   Temp 98.2 F (36.8 C) (Oral)   Ht 5\' 4"  (1.626 m)   Wt 259 lb (117.5 kg)   LMP 09/13/2022 (Exact Date)   SpO2 97%   BMI 44.46 kg/m   BP Readings from Last 3 Encounters:  04/23/23 112/70  02/24/23 130/77  02/09/23 129/77    Wt Readings from Last 3 Encounters:  04/23/23 259 lb (117.5 kg)  02/24/23 254 lb (115.2 kg)  01/07/23 254 lb (115.2 kg)    Physical Exam Constitutional:      General: She is not in acute distress.    Appearance: She is well-developed.  HENT:     Head: Normocephalic.     Right Ear: External ear normal.     Left Ear:  External ear normal.     Nose: Nose normal.  Eyes:     General:        Right eye: No discharge.        Left eye: No discharge.     Conjunctiva/sclera: Conjunctivae normal.     Pupils: Pupils are equal, round, and reactive to light.  Neck:     Thyroid: No thyromegaly.     Vascular: No JVD.     Trachea: No tracheal deviation.  Cardiovascular:     Rate and Rhythm: Normal rate and regular rhythm.     Heart sounds: Normal heart sounds.  Pulmonary:     Effort: No respiratory distress.     Breath sounds: No stridor. No wheezing.  Abdominal:     General: Bowel sounds are normal. There is no distension.     Palpations: Abdomen is soft. There  is no mass.     Tenderness: There is no abdominal tenderness. There is no guarding or rebound.  Musculoskeletal:        General: No tenderness.     Cervical back: Normal range of motion and neck supple. No rigidity.  Lymphadenopathy:     Cervical: No cervical adenopathy.  Skin:    Findings: No erythema or rash.  Neurological:     Cranial Nerves: No cranial nerve deficit.     Motor: No abnormal muscle tone.     Coordination: Coordination normal.     Deep Tendon Reflexes: Reflexes normal.  Psychiatric:        Behavior: Behavior normal.        Thought Content: Thought content normal.        Judgment: Judgment normal.   RLQ --  painful to palpation; no rebound. No mass  I spent 22 minutes in addition to time for CPX wellness examination in preparing to see the patient by review of recent labs, imaging and procedures, obtaining and reviewing separately obtained history, communicating with the patient, ordering medications, tests or procedures, and documenting clinical information in the EHR including the differential diagnosis, treatment, and any further evaluation and other management of RLQ abd pain, asthma         Lab Results  Component Value Date   WBC 7.3 12/30/2022   HGB 13.2 12/30/2022   HCT 38.9 12/30/2022   PLT 247.0 12/30/2022   GLUCOSE 82 02/24/2023   CHOL 201 (H) 01/07/2023   TRIG 174 (H) 01/07/2023   HDL 50 01/07/2023   LDLDIRECT 105.0 04/21/2022   LDLCALC 120 (H) 01/07/2023   ALT 23 02/24/2023   AST 23 02/24/2023   NA 139 02/24/2023   K 4.5 02/24/2023   CL 102 02/24/2023   CREATININE 0.77 02/24/2023   BUN 11 02/24/2023   CO2 24 02/24/2023   TSH 1.670 01/07/2023   INR 1.0 07/09/2020   HGBA1C 5.4 12/30/2022    No results found.  Assessment & Plan:   Problem List Items Addressed This Visit     Asthma    Worse COVID(-) Start Airsupra MDI (Albuterol is not working well) On Symbicort      Relevant Medications   Albuterol-Budesonide (AIRSUPRA) 90-80  MCG/ACT AERO   Weight gain    Wt Readings from Last 3 Encounters:  04/23/23 259 lb (117.5 kg)  02/24/23 254 lb (115.2 kg)  01/07/23 254 lb (115.2 kg)  Diet discussed On Wegovy 0.5 mg/wk      Well adult exam - Primary     We discussed age appropriate health related issues,  including available/recomended screening tests and vaccinations. Labs were ordered to be later reviewed . All questions were answered. We discussed one or more of the following - seat belt use, use of sunscreen/sun exposure exercise, fall risk reduction, second hand smoke exposure, firearm use and storage, seat belt use, a need for adhering to healthy diet and exercise. Wt loss/diet Partial hysterectomy Jan 2024 Labs were ordered.  All questions were answered.         Relevant Orders   TSH   Urinalysis   CBC with Differential/Platelet   Lipid panel   Comprehensive metabolic panel   RLQ abdominal pain    New - worse CT abd/pelvis - r/o appendicitis, etc CBC To ER if worse      Other Visit Diagnoses     Right lower quadrant abdominal pain       Relevant Orders   CT ABDOMEN PELVIS W CONTRAST   Sinus congestion       Relevant Orders   POC COVID-19 (Completed)         Meds ordered this encounter  Medications   EPINEPHrine (EPIPEN 2-PAK) 0.3 mg/0.3 mL IJ SOAJ injection    Sig: Inject 1 pen into the muscle as needed for anaphylaxis.    Dispense:  2 each    Refill:  3   Albuterol-Budesonide (AIRSUPRA) 90-80 MCG/ACT AERO    Sig: Inhale 2 Inhalations into the lungs every 4 (four) hours as needed.    Dispense:  30 g    Refill:  3      Follow-up: Return in about 2 weeks (around 05/07/2023) for a follow-up visit.  Sonda Primes, MD

## 2023-04-28 ENCOUNTER — Other Ambulatory Visit: Payer: Self-pay

## 2023-04-30 ENCOUNTER — Encounter: Payer: Self-pay | Admitting: Internal Medicine

## 2023-05-03 ENCOUNTER — Other Ambulatory Visit: Payer: Self-pay | Admitting: Internal Medicine

## 2023-05-03 MED ORDER — METHYLPREDNISOLONE 4 MG PO TBPK
ORAL_TABLET | ORAL | 0 refills | Status: AC
  Filled 2023-05-03: qty 21, 6d supply, fill #0

## 2023-05-04 ENCOUNTER — Other Ambulatory Visit: Payer: Self-pay

## 2023-05-05 ENCOUNTER — Other Ambulatory Visit: Payer: Self-pay

## 2023-05-05 ENCOUNTER — Other Ambulatory Visit: Payer: 59

## 2023-05-06 ENCOUNTER — Ambulatory Visit: Admission: RE | Admit: 2023-05-06 | Payer: 59 | Source: Ambulatory Visit

## 2023-05-06 ENCOUNTER — Other Ambulatory Visit: Payer: Self-pay

## 2023-05-06 ENCOUNTER — Encounter: Payer: Self-pay | Admitting: Pharmacist

## 2023-05-06 DIAGNOSIS — I899 Noninfective disorder of lymphatic vessels and lymph nodes, unspecified: Secondary | ICD-10-CM | POA: Diagnosis not present

## 2023-05-06 DIAGNOSIS — D242 Benign neoplasm of left breast: Secondary | ICD-10-CM

## 2023-05-09 ENCOUNTER — Other Ambulatory Visit (HOSPITAL_COMMUNITY): Payer: Self-pay

## 2023-05-12 ENCOUNTER — Other Ambulatory Visit: Payer: Self-pay

## 2023-05-20 ENCOUNTER — Encounter (INDEPENDENT_AMBULATORY_CARE_PROVIDER_SITE_OTHER): Payer: Self-pay | Admitting: Family Medicine

## 2023-06-02 ENCOUNTER — Ambulatory Visit: Payer: 59 | Attending: Physician Assistant | Admitting: Nurse Practitioner

## 2023-06-02 ENCOUNTER — Encounter: Payer: Self-pay | Admitting: Nurse Practitioner

## 2023-06-02 VITALS — BP 132/78 | HR 95 | Ht 63.0 in | Wt 262.2 lb

## 2023-06-02 DIAGNOSIS — M79601 Pain in right arm: Secondary | ICD-10-CM

## 2023-06-02 DIAGNOSIS — J4530 Mild persistent asthma, uncomplicated: Secondary | ICD-10-CM | POA: Diagnosis not present

## 2023-06-02 DIAGNOSIS — R002 Palpitations: Secondary | ICD-10-CM

## 2023-06-02 DIAGNOSIS — F411 Generalized anxiety disorder: Secondary | ICD-10-CM | POA: Diagnosis not present

## 2023-06-02 NOTE — Progress Notes (Signed)
Office Visit    Patient Name: Deanna Schwartz Date of Encounter: 06/02/2023  Primary Care Provider:  Tresa Garter, MD Primary Cardiologist:  Maisie Fus, MD  Chief Complaint    42 year old female with a history of palpitations, asthma, GERD, and anxiety who presents for follow-up related to palpitations.  Past Medical History    Past Medical History:  Diagnosis Date   Allergic rhinitis    Anemia    Anxiety    improved per pt on 09/14/22   Asthma    Follows w/ [facility], Dr. Barnetta Chapel. Patient states last asthma exacerbation was in early December 2023. States she is breathing well as of 09/16/22.   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 pylori infection 2008   Hx of tx + serology   High cholesterol    IBS (irritable bowel syndrome)    Iron deficiency anemia 08/06/2011   Hx of multiple iron infusions, most recent in 08/2022, per pt she is scheduled for an infusion on 09/19/22   Irritable bowel syndrome    Knee pain    Obesity    Follows with [facility].   Palpitations 04/2021   Echo on 06/04/21 EF 55 -60%   Panic attack    Prediabetes    many years ago per pt, labs improved as of 2024   Shortness of breath    w/ asthma exacerbations   Ulcer    Vitamin D deficiency    resolved per pt 2024   Past Surgical History:  Procedure Laterality Date   ABDOMINAL HYSTERECTOMY     BREAST BIOPSY Left 07/2022   CYSTOSCOPY N/A 09/22/2022   Procedure: CYSTOSCOPY;  Surgeon: Sherian Rein, MD;  Location: [facility];  Service: Gynecology;  Laterality: N/A;   FLEXIBLE SIGMOIDOSCOPY  04/14/2008   normal   NASAL SINUS SURGERY  2012   ROBOTIC ASSISTED LAPAROSCOPIC  HYSTERECTOMY AND SALPINGECTOMY Bilateral 09/22/2022   Procedure: XI ROBOTIC ASSISTED LAPAROSCOPIC HYSTERECTOMY AND SALPINGECTOMY;  Surgeon: Sherian Rein, MD;  Location: [facility];  Service: Gynecology;  Laterality: Bilateral;   UPPER GASTROINTESTINAL ENDOSCOPY     around 2014    Allergies  Allergies  Allergen Reactions   Augmentin [Amoxicillin-Pot Clavulanate] Rash   Cephalexin Rash   Iodine Swelling and Rash    Throat swelling after eating shrimp. Patient states that she is no longer allergic to iodine or shrimp as of 09/16/22.     Labs/Other Studies Reviewed    The following studies were reviewed today:  Cardiac Studies & Procedures       ECHOCARDIOGRAM  ECHOCARDIOGRAM COMPLETE 06/04/2021  Narrative ECHOCARDIOGRAM REPORT    Patient Name:   Deanna Schwartz [facility] Date of Exam: 06/04/2021 Medical Rec #:  [id number]           Height:       63.0 in Accession #:    [id number]          Weight:       247.4 lb Date of Birth:  02/24/1981            BSA:  2.117 m Patient Age:    40 years            BP:           120/68 mmHg Patient Gender: F                   HR:           74 bpm. Exam Location:  [address]  Procedure: 2D Echo, 3D Echo, Cardiac Doppler and Color Doppler  Indications:    R00.2 Palpitations  History:        Patient has prior history of Echocardiogram examinations, most recent 10/07/2012. Morbid obesity, Signs/Symptoms:Shortness of Breath; Risk Factors:Palpitations.  Sonographer:    Samule Ohm RDCS Referring Phys: [id number] [place]  IMPRESSIONS   1. Left ventricular ejection fraction, by estimation, is 55 to 60%. Left ventricular ejection fraction by 3D volume is 55 %. The left ventricle has normal function. The left ventricle has no regional wall motion abnormalities. Left ventricular diastolic parameters were normal. 2. Right ventricular systolic function is normal. The right ventricular size is normal. 3. The mitral  valve is normal in structure. No evidence of mitral valve regurgitation. 4. The aortic valve was not well visualized. Aortic valve regurgitation is not visualized. No aortic stenosis is present.  Comparison(s): A prior study was performed on 10/07/2012. No significant change from prior study. Prior images reviewed side by side.  FINDINGS Left Ventricle: Left ventricular ejection fraction, by estimation, is 55 to 60%. Left ventricular ejection fraction by 3D volume is 55 %. The left ventricle has normal function. The left ventricle has no regional wall motion abnormalities. The left ventricular internal cavity size was normal in size. There is no left ventricular hypertrophy. Left ventricular diastolic parameters were normal.  Right Ventricle: The right ventricular size is normal. No increase in right ventricular wall thickness. Right ventricular systolic function is normal.  Left Atrium: Left atrial size was normal in size.  Right Atrium: Right atrial size was normal in size.  Pericardium: There is no evidence of pericardial effusion.  Mitral Valve: The mitral valve is normal in structure. No evidence of mitral valve regurgitation.  Tricuspid Valve: The tricuspid valve is normal in structure. Tricuspid valve regurgitation is mild . No evidence of tricuspid stenosis.  Aortic Valve: The aortic valve was not well visualized. Aortic valve regurgitation is not visualized. No aortic stenosis is present.  Pulmonic Valve: The pulmonic valve was normal in structure. Pulmonic valve regurgitation is not visualized. No evidence of pulmonic stenosis.  Aorta: The aortic root and ascending aorta are structurally normal, with no evidence of dilitation.  IAS/Shunts: The atrial septum is grossly normal.   LEFT VENTRICLE PLAX 2D LVIDd:         4.50 cm         Diastology LVIDs:         2.90 cm         LV e' medial:    13.20 cm/s LV PW:         0.90 cm         LV E/e' medial:  7.0 LV IVS:        0.90 cm          LV e' lateral:   17.00 cm/s LVOT diam:     1.80 cm         LV E/e' lateral: 5.5 LV SV:         49 LV SV Index:  23 LVOT Area:     2.54 cm        3D Volume EF LV 3D EF:    Left ventricul ar ejection fraction by 3D volume is 55 %.  3D Volume EF: 3D EF:        55 % LV EDV:       198 ml LV ESV:       88 ml LV SV:        109 ml  RIGHT VENTRICLE             IVC RV S prime:     21.70 cm/s  IVC diam: 1.50 cm TAPSE (M-mode): 2.5 cm RVSP:           25.1 mmHg  LEFT ATRIUM             Index       RIGHT ATRIUM           Index LA diam:        3.90 cm 1.84 cm/m  RA Pressure: 3.00 mmHg LA Vol (A2C):   54.9 ml 25.94 ml/m RA Area:     15.30 cm LA Vol (A4C):   48.9 ml 23.10 ml/m RA Volume:   38.60 ml  18.24 ml/m LA Biplane Vol: 53.0 ml 25.04 ml/m AORTIC VALVE LVOT Vmax:   96.60 cm/s LVOT Vmean:  66.000 cm/s LVOT VTI:    0.192 m  AORTA Ao Root diam: 2.70 cm Ao Asc diam:  2.50 cm  MITRAL VALVE               TRICUSPID VALVE MV Area (PHT): 3.51 cm    TR Peak grad:   22.1 mmHg MV Decel Time: 216 msec    TR Vmax:        235.00 cm/s MV E velocity: 92.80 cm/s  Estimated RAP:  3.00 mmHg MV A velocity: 53.50 cm/s  RVSP:           25.1 mmHg MV E/A ratio:  1.73 SHUNTS Systemic VTI:  0.19 m Systemic Diam: 1.80 cm  Riley Lam MD Electronically signed by Riley Lam MD Signature Date/Time: 06/04/2021/12:48:55 PM    Final            Recent Labs: 04/23/2023: ALT 21; BUN 15; Creatinine, Ser 0.82; Hemoglobin 13.5; Platelets 255.0; Potassium 3.9; Sodium 136; TSH 1.90  Recent Lipid Panel    Component Value Date/Time   CHOL 204 (H) 04/23/2023 0925   CHOL 201 (H) 01/07/2023 0843   TRIG 234.0 (H) 04/23/2023 0925   HDL 45.80 04/23/2023 0925   HDL 50 01/07/2023 0843   CHOLHDL 4 04/23/2023 0925   VLDL 46.8 (H) 04/23/2023 0925   LDLCALC 120 (H) 01/07/2023 0843   LDLDIRECT 152.0 04/23/2023 0925    History of Present Illness    42 year old female with the  above past medical history including palpitations, asthma, GERD, and anxiety.  Echocardiogram in 05/2021 showed EF 55 to 60%, normal LV function, no RWMA, normal RV, no significant valvular abnormalities. She was referred to Dr. Wyline Mood in 11/2022 in the setting of palpitations.  She noted associated shortness of breath, occasional lightheadedness.  She reported a prior syncopal episode in 2017.  She was last seen in the office on 12/03/2022 and noted a several month history of palpitations.  Cardiac monitor was ordered but not completed.  She presents today for follow-up.  Since her last visit she has been stable overall from a cardiac standpoint.  She notes that she  never received her heart monitor.  Her palpitations improved, and so she never contacted our office.  She notes she has been working with her primary care doctor to adjust her asthma medication.  She notes ongoing shortness of breath despite medication changes and feels that she is having to need her as needed inhaler more often than she should.  Does note an elevated heart rate after using her inhaler.  She denies any palpitations, dizziness, presyncope or syncope.  Additionally, she notes a 1 month history of intermittent right arm pain that extends into her neck.  She notes she took ibuprofen with no improvement in her symptoms.  She denies any chest pain or exertional symptoms concerning for angina. She denies any  edema, PND, orthopnea, weight gain.  She notes that her anxiety is well controlled.   Home Medications    Current Outpatient Medications  Medication Sig Dispense Refill   Albuterol-Budesonide (AIRSUPRA) 90-80 MCG/ACT AERO Inhale 2 Inhalations into the lungs every 4 (four) hours as needed. 30 g 3   EPINEPHrine (EPIPEN 2-PAK) 0.3 mg/0.3 mL IJ SOAJ injection Inject 1 pen into the muscle as needed for anaphylaxis. 2 each 3   levocetirizine (XYZAL) 5 MG tablet Take 1 tablet (5 mg total) by mouth every evening. 90 tablet 0    montelukast (SINGULAIR) 10 MG tablet Take 1 tablet (10 mg total) by mouth at bedtime. 90 tablet 3   Prenatal Vit-Fe Fumarate-FA (PRENATAL VITAMINS PO) Take 1 tablet by mouth daily.     Semaglutide-Weight Management (WEGOVY) 0.5 MG/0.5ML SOAJ Inject 0.5 mg into the skin once a week. 2 mL 0   sertraline (ZOLOFT) 50 MG tablet Take 1 tablet (50 mg total) by mouth daily. (Patient taking differently: Take 25 mg by mouth daily.) 90 tablet 3   budesonide-formoterol (SYMBICORT) 160-4.5 MCG/ACT inhaler Inhale 2 puffs into the lungs 2 (two) times daily. 30.6 g 1   cetirizine (ZYRTEC ALLERGY) 10 MG tablet Take 1 tablet (10 mg total) by mouth at bedtime. (Patient taking differently: Take 10 mg by mouth daily.) 30 tablet 2   No current facility-administered medications for this visit.     Review of Systems    She denies chest pain, palpitations, dyspnea, pnd, orthopnea, n, v, dizziness, syncope, edema, weight gain, or early satiety. All other systems reviewed and are otherwise negative except as noted above.   Physical Exam    VS:  BP 132/78   Pulse 95   Ht 5\' 3"  (1.6 m)   Wt 262 lb 3.2 oz (118.9 kg)   LMP 09/13/2022 (Exact Date)   SpO2 98%   BMI 46.45 kg/m  GEN: Well nourished, well developed, in no acute distress. HEENT: normal. Neck: Supple, no JVD, carotid bruits, or masses. Cardiac: RRR, no murmurs, rubs, or gallops. No clubbing, cyanosis, edema.  Radials/DP/PT 2+ and equal bilaterally.  Respiratory:  Respirations regular and unlabored, clear to auscultation bilaterally. GI: Soft, nontender, nondistended, BS + x 4. MS: no deformity or atrophy. Skin: warm and dry, no rash. Neuro:  Strength and sensation are intact. Psych: Normal affect.  Accessory Clinical Findings    ECG personally reviewed by me today -    - no EKG in office today.    Lab Results  Component Value Date   WBC 9.0 04/23/2023   HGB 13.5 04/23/2023   HCT 41.2 04/23/2023   MCV 90.0 04/23/2023   PLT 255.0 04/23/2023    Lab Results  Component Value Date   CREATININE 0.82 04/23/2023  BUN 15 04/23/2023   NA 136 04/23/2023   K 3.9 04/23/2023   CL 99 04/23/2023   CO2 30 04/23/2023   Lab Results  Component Value Date   ALT 21 04/23/2023   AST 20 04/23/2023   ALKPHOS 57 04/23/2023   BILITOT 0.4 04/23/2023   Lab Results  Component Value Date   CHOL 204 (H) 04/23/2023   HDL 45.80 04/23/2023   LDLCALC 120 (H) 01/07/2023   LDLDIRECT 152.0 04/23/2023   TRIG 234.0 (H) 04/23/2023   CHOLHDL 4 04/23/2023    Lab Results  Component Value Date   HGBA1C 5.4 12/30/2022    Assessment & Plan    1. Palpitations: Cardiac monitor with previously recommended in the setting of palpitations but never completed (patient never received the monitor).  She notes an elevated resting heart rate after using her inhalers, denies any palpitations or associated symptoms. Offered to repeat cardiac monitor, she declines.  Continue to monitor heart rate, symptoms.  2. R arm pain: She notes a 1 month history of intermittent right arm pain that extends into her neck.  She took ibuprofen with no relief.  She denies any chest pain or exertional symptoms concerning for angina (she exercises regularly). Consider musculoskeletal cause of symptoms, recommend follow-up with PCP.  3. Asthma: Uncontrolled, recommend follow-up with PCP.  4. Anxiety: Denies any recent symptoms of anxiety.  5. Disposition: Follow-up in 3 to 4 months with Dr. Wyline Mood.      Joylene Grapes, NP 06/02/2023, 5:00 PM

## 2023-06-02 NOTE — Patient Instructions (Signed)
Medication Instructions:  No changes *If you need a refill on your cardiac medications before your next appointment, please call your pharmacy*   Lab Work: No Labs If you have labs (blood work) drawn today and your tests are completely normal, you will receive your results only by: MyChart Message (if you have MyChart) OR A paper copy in the mail If you have any lab test that is abnormal or we need to change your treatment, we will call you to review the results.   Testing/Procedures: No Testing   Follow-Up: At [facility], you and your health needs are our priority.  As part of our continuing mission to provide you with exceptional heart care, we have created designated Provider Care Teams.  These Care Teams include your primary Cardiologist (physician) and Advanced Practice Providers (APPs -  Physician Assistants and Nurse Practitioners) who all work together to provide you with the care you need, when you need it.  We recommend signing up for the patient portal called "MyChart".  Sign up information is provided on this After Visit Summary.  MyChart is used to connect with patients for Virtual Visits (Telemedicine).  Patients are able to view lab/test results, encounter notes, upcoming appointments, etc.  Non-urgent messages can be sent to your provider as well.   To learn more about what you can do with MyChart, go to [email or web address].    Your next appointment:   3-4 month(s)  Provider:   Maisie Fus, MD

## 2023-06-03 ENCOUNTER — Ambulatory Visit (INDEPENDENT_AMBULATORY_CARE_PROVIDER_SITE_OTHER): Payer: 59 | Admitting: Family Medicine

## 2023-06-03 ENCOUNTER — Encounter (INDEPENDENT_AMBULATORY_CARE_PROVIDER_SITE_OTHER): Payer: Self-pay | Admitting: Family Medicine

## 2023-06-03 ENCOUNTER — Other Ambulatory Visit: Payer: Self-pay

## 2023-06-03 VITALS — BP 117/77 | HR 102 | Temp 98.5°F | Ht 63.0 in | Wt 257.0 lb

## 2023-06-03 DIAGNOSIS — Z6841 Body Mass Index (BMI) 40.0 and over, adult: Secondary | ICD-10-CM

## 2023-06-03 DIAGNOSIS — E669 Obesity, unspecified: Secondary | ICD-10-CM | POA: Diagnosis not present

## 2023-06-03 DIAGNOSIS — E559 Vitamin D deficiency, unspecified: Secondary | ICD-10-CM | POA: Diagnosis not present

## 2023-06-03 DIAGNOSIS — E7849 Other hyperlipidemia: Secondary | ICD-10-CM

## 2023-06-03 MED ORDER — VITAMIN D (ERGOCALCIFEROL) 1.25 MG (50000 UNIT) PO CAPS
50000.0000 [IU] | ORAL_CAPSULE | ORAL | 0 refills | Status: DC
Start: 2023-06-03 — End: 2023-10-15
  Filled 2023-06-03 – 2023-07-22 (×2): qty 4, 28d supply, fill #0

## 2023-06-03 NOTE — Progress Notes (Signed)
Chief Complaint:   OBESITY Deanna Schwartz is here to discuss her progress with her obesity treatment plan along with follow-up of her obesity related diagnoses. Deanna Schwartz is on the Category 3 Plan and states she is following her eating plan approximately 20% of the time. Deanna Schwartz states she is doing Zumba and walking for 35-45 minutes 2-3 times per week.  Today's visit was #: 3 Starting weight: 254 lbs Starting date: 01/07/2023 Today's weight: 257 lbs Today's date: 06/03/2023 Total lbs lost to date: 0 Total lbs lost since last in-office visit: 0  Interim History: Last appointment was over 3 months ago.  She was busy for a majority of the summer.  She felt ate mindfully over the summer and did a lot of eating in.  Dinner time is late.  She isn't getting all the meat in.  She is not weighing the 8-10oz.  She did buy a protein powder that is 210 calories and 30g of protein. She wants to commit to Category 3.  No upcoming plans for the next few weeks.  Subjective:   1. Vitamin D deficiency Patient is not on vitamin D.  She denies nausea, vomiting, or muscle weakness but notes fatigue.  2. Other hyperlipidemia Patient is not on medications.  Her last LDL was 120, HDL 50, and triglycerides 829.  Assessment/Plan:   1. Vitamin D deficiency Patient agreed to restart vitamin D 50,000 IU once weekly with no refills.  - Vitamin D, Ergocalciferol, (DRISDOL) 1.25 MG (50000 UNIT) CAPS capsule; Take 1 capsule (50,000 Units total) by mouth every 7 (seven) days.  Dispense: 4 capsule; Refill: 0  2. Other hyperlipidemia We will repeat labs in December.  3. BMI 40.0-44.9, adult (HCC)  4. Obesity, starting BMI 43.60 Deanna Schwartz is currently in the action stage of change. As such, her goal is to continue with weight loss efforts. She has agreed to the Category 3 Plan.   Exercise goals: All adults should avoid inactivity. Some physical activity is better than none, and adults who participate in any  amount of physical activity gain some health benefits.  Behavioral modification strategies: increasing lean protein intake, meal planning and cooking strategies, keeping healthy foods in the home, and planning for success.  Deanna Schwartz has agreed to follow-up with our clinic in 3 weeks. She was informed of the importance of frequent follow-up visits to maximize her success with intensive lifestyle modifications for her multiple health conditions.   Objective:   Blood pressure 117/77, pulse (!) 102, temperature 98.5 F (36.9 C), height 5\' 3"  (1.6 m), weight 257 lb (116.6 kg), last menstrual period 09/13/2022, SpO2 98%. Body mass index is 45.53 kg/m.  General: Cooperative, alert, well developed, in no acute distress. HEENT: Conjunctivae and lids unremarkable. Cardiovascular: Regular rhythm.  Lungs: Normal work of breathing. Neurologic: No focal deficits.   Lab Results  Component Value Date   CREATININE 0.82 04/23/2023   BUN 15 04/23/2023   NA 136 04/23/2023   K 3.9 04/23/2023   CL 99 04/23/2023   CO2 30 04/23/2023   Lab Results  Component Value Date   ALT 21 04/23/2023   AST 20 04/23/2023   ALKPHOS 57 04/23/2023   BILITOT 0.4 04/23/2023   Lab Results  Component Value Date   HGBA1C 5.4 12/30/2022   HGBA1C 5.6 07/23/2021   HGBA1C 5.6 01/25/2021   HGBA1C 5.7 09/17/2020   HGBA1C 5.7 12/16/2016   Lab Results  Component Value Date   INSULIN 13.0 01/07/2023   INSULIN 10.4  07/23/2021   INSULIN 13.0 08/26/2016   Lab Results  Component Value Date   TSH 1.90 04/23/2023   Lab Results  Component Value Date   CHOL 204 (H) 04/23/2023   HDL 45.80 04/23/2023   LDLCALC 120 (H) 01/07/2023   LDLDIRECT 152.0 04/23/2023   TRIG 234.0 (H) 04/23/2023   CHOLHDL 4 04/23/2023   Lab Results  Component Value Date   VD25OH 31.8 01/07/2023   VD25OH 27.03 (L) 04/21/2022   VD25OH 52.2 07/23/2021   Lab Results  Component Value Date   WBC 9.0 04/23/2023   HGB 13.5 04/23/2023   HCT  41.2 04/23/2023   MCV 90.0 04/23/2023   PLT 255.0 04/23/2023   Lab Results  Component Value Date   IRON 117 12/30/2022   TIBC 316.4 12/30/2022   FERRITIN 147.8 12/30/2022   Attestation Statements:   Reviewed by clinician on day of visit: allergies, medications, problem list, medical history, surgical history, family history, social history, and previous encounter notes.   I, Burt Knack, am acting as transcriptionist for Reuben Likes, MD.  I have reviewed the above documentation for accuracy and completeness, and I agree with the above. - Reuben Likes, MD

## 2023-06-04 ENCOUNTER — Other Ambulatory Visit: Payer: Self-pay

## 2023-06-08 ENCOUNTER — Other Ambulatory Visit (HOSPITAL_COMMUNITY): Payer: Self-pay

## 2023-06-08 DIAGNOSIS — Z130 Encounter for screening for diseases of the blood and blood-forming organs and certain disorders involving the immune mechanism: Secondary | ICD-10-CM | POA: Diagnosis not present

## 2023-06-08 DIAGNOSIS — Z01419 Encounter for gynecological examination (general) (routine) without abnormal findings: Secondary | ICD-10-CM | POA: Diagnosis not present

## 2023-06-08 DIAGNOSIS — Z842 Family history of other diseases of the genitourinary system: Secondary | ICD-10-CM | POA: Diagnosis not present

## 2023-06-08 DIAGNOSIS — Z780 Asymptomatic menopausal state: Secondary | ICD-10-CM | POA: Diagnosis not present

## 2023-06-09 ENCOUNTER — Other Ambulatory Visit: Payer: Self-pay

## 2023-06-24 ENCOUNTER — Ambulatory Visit (INDEPENDENT_AMBULATORY_CARE_PROVIDER_SITE_OTHER): Payer: 59 | Admitting: Family Medicine

## 2023-07-14 ENCOUNTER — Ambulatory Visit (INDEPENDENT_AMBULATORY_CARE_PROVIDER_SITE_OTHER): Payer: 59 | Admitting: Family Medicine

## 2023-07-22 ENCOUNTER — Other Ambulatory Visit: Payer: Self-pay

## 2023-07-22 ENCOUNTER — Other Ambulatory Visit: Payer: Self-pay | Admitting: Internal Medicine

## 2023-07-23 ENCOUNTER — Other Ambulatory Visit: Payer: Self-pay

## 2023-07-23 ENCOUNTER — Telehealth: Payer: 59 | Admitting: Nurse Practitioner

## 2023-07-23 ENCOUNTER — Encounter: Payer: Self-pay | Admitting: Internal Medicine

## 2023-07-23 DIAGNOSIS — J019 Acute sinusitis, unspecified: Secondary | ICD-10-CM

## 2023-07-23 MED ORDER — SERTRALINE HCL 50 MG PO TABS
50.0000 mg | ORAL_TABLET | Freq: Every day | ORAL | 1 refills | Status: AC
  Filled 2023-07-23 – 2023-11-08 (×2): qty 90, 90d supply, fill #0
  Filled 2024-07-04: qty 90, 90d supply, fill #1

## 2023-07-23 MED ORDER — AZITHROMYCIN 250 MG PO TABS
ORAL_TABLET | ORAL | 0 refills | Status: AC
Start: 2023-07-23 — End: 2023-07-28

## 2023-07-23 MED ORDER — PREDNISONE 20 MG PO TABS
40.0000 mg | ORAL_TABLET | Freq: Every day | ORAL | 0 refills | Status: DC
Start: 2023-07-23 — End: 2023-08-12

## 2023-07-23 NOTE — Assessment & Plan Note (Signed)
Acute Concern for possible bacterial etiology, treat with Z-Pak Patient not wheezing currently, but she is concerned that if symptoms worsen she may end up an asthma exacerbation so I will send in prednisone that she can have on hand in the event that she needs it.  She was told to take prednisone 20 mg tablets, take 2 tablets by mouth every morning x 5 days if needed for wheezing.  Was educated to avoid NSAID use while taking prednisone and to take prednisone with a small meal.  She reports her understanding. Encouraged to call office if symptoms persist or worsen.

## 2023-07-23 NOTE — Progress Notes (Signed)
   Established Patient Office Visit  An audio/visual tele-health visit was completed today for this patient. I connected with  AUSTIN GIANNOTTI on 07/23/23 utilizing audio/visual technology and verified that I am speaking with the correct person using two identifiers. The patient was located at their home, and I was located at the office of [facility] at [facility] during the encounter. I discussed the limitations of evaluation and management by telemedicine. The patient expressed understanding and agreed to proceed.      Subjective   Patient ID: Deanna Schwartz, female    DOB: 1980-10-15  Age: 42 y.o. MRN: [id number]  Chief Complaint  Patient presents with   Sinus Problem    Sinus pressure, started last week, been using over the counter product and it has not help    Arrives today for acute virtual visit.  Reports symptom onset was a bit over a week ago.  She has past medical history significant for asthma and allergies.  She reports initially she started having some nasal congestion and runny nose, but this has worsened and now she is experiencing sinus pain and headaches.  No fever or chills, does have some shortness of breath but reports this is at her baseline.     Review of Systems  Constitutional:  Negative for chills and fever.  HENT:  Positive for congestion and sinus pain.        (+) PND  Respiratory:  Positive for shortness of breath (chronic, at baseline). Negative for cough and wheezing.   Cardiovascular:  Negative for chest pain.  Neurological:  Positive for headaches.      Objective:     LMP 09/13/2022 (Exact Date)    Physical Exam Comprehensive physical exam not completed today as office visit was conducted remotely.  Patient appears well over video.  Patient was alert and oriented, and appeared to have appropriate judgment.   No results found for any visits on 07/23/23.    The 10-year ASCVD risk score (Arnett DK, et al., 2019) is: 0.7%     Assessment & Plan:   Problem List Items Addressed This Visit       Respiratory   Acute non-recurrent sinusitis - Primary    Acute Concern for possible bacterial etiology, treat with Z-Pak Patient not wheezing currently, but she is concerned that if symptoms worsen she may end up an asthma exacerbation so I will send in prednisone that she can have on hand in the event that she needs it.  She was told to take prednisone 20 mg tablets, take 2 tablets by mouth every morning x 5 days if needed for wheezing.  Was educated to avoid NSAID use while taking prednisone and to take prednisone with a small meal.  She reports her understanding. Encouraged to call office if symptoms persist or worsen.      Relevant Medications   azithromycin (ZITHROMAX) 250 MG tablet   predniSONE (DELTASONE) 20 MG tablet    No follow-ups on file.    Elenore Paddy, NP

## 2023-07-28 ENCOUNTER — Other Ambulatory Visit: Payer: Self-pay

## 2023-08-09 ENCOUNTER — Encounter (INDEPENDENT_AMBULATORY_CARE_PROVIDER_SITE_OTHER): Payer: Self-pay | Admitting: Family Medicine

## 2023-08-12 ENCOUNTER — Other Ambulatory Visit (HOSPITAL_COMMUNITY): Payer: Self-pay

## 2023-08-12 ENCOUNTER — Ambulatory Visit (INDEPENDENT_AMBULATORY_CARE_PROVIDER_SITE_OTHER): Payer: 59 | Admitting: Family Medicine

## 2023-08-12 VITALS — BP 121/78 | HR 101 | Temp 98.2°F | Ht 63.0 in | Wt 258.0 lb

## 2023-08-12 DIAGNOSIS — E88819 Insulin resistance, unspecified: Secondary | ICD-10-CM

## 2023-08-12 DIAGNOSIS — E7849 Other hyperlipidemia: Secondary | ICD-10-CM | POA: Diagnosis not present

## 2023-08-12 DIAGNOSIS — E66813 Obesity, class 3: Secondary | ICD-10-CM

## 2023-08-12 DIAGNOSIS — E559 Vitamin D deficiency, unspecified: Secondary | ICD-10-CM | POA: Diagnosis not present

## 2023-08-12 DIAGNOSIS — Z6841 Body Mass Index (BMI) 40.0 and over, adult: Secondary | ICD-10-CM | POA: Diagnosis not present

## 2023-08-12 DIAGNOSIS — E669 Obesity, unspecified: Secondary | ICD-10-CM | POA: Diagnosis not present

## 2023-08-12 MED ORDER — WEGOVY 0.5 MG/0.5ML ~~LOC~~ SOAJ
0.5000 mg | SUBCUTANEOUS | 0 refills | Status: DC
Start: 2023-08-12 — End: 2023-10-15
  Filled 2023-08-12: qty 2, 28d supply, fill #0

## 2023-08-12 NOTE — Progress Notes (Signed)
SUBJECTIVE:  Chief Complaint: Obesity  Interim History:  Patient feels disappointed she did not follow meal plan 100% of the time. She voices concern about excessive thirst and dry mouth. She is drinking about 60oz of water a day. She drinks about 3 cups of coffee a day.  She has been drinking karma probiotic drinks one every other day. She is struggling to get in a full plate of meat.  She has incorporated more salad.  She is trying to implement portion control. She wants to start incorporating more exercise. She joined the [employer] but hasn't started going yet. For the next month she is planning to go out of state for a 51th birthday and then coming back for Christmas.  Deanna Schwartz is here to discuss her progress with her obesity treatment plan. She is on the Category 3 Plan and states she is following her eating plan approximately 50 % of the time. She states she is not exercising 0 minutes 0 times per week.   OBJECTIVE: Visit Diagnoses: Problem List Items Addressed This Visit       Endocrine   Insulin resistance   Patient is on GLP1 medication.  She is working on increasing total amount of food daily and getting macronutrient composition in the ratios we discussed.  Needs repeat labs done when she is fasting- labs were ordered today but patient will get them done when she is fasting.      Relevant Orders   Comprehensive metabolic panel (Completed)   Hemoglobin A1c (Completed)     Other   Vitamin D deficiency - Primary   Patient last Vitamin D level was 31.8 in May of this year.  She has been on supplemental Vitamin d since that time.  Vitamin D level ordered today.      Relevant Orders   VITAMIN D 25 Hydroxy (Vit-D Deficiency, Fractures) (Completed)   Other hyperlipidemia   Last labs done in August of this year.  Patient not on statin.  Needs repeat FLP- ordered today. Patient to come get labs when she is fasting prior to next appointment.      Relevant Orders   Lipid Panel With  LDL/HDL Ratio   Other Visit Diagnoses       Obesity, starting BMI 43.60       Relevant Medications   Semaglutide-Weight Management (WEGOVY) 0.5 MG/0.5ML SOAJ     BMI 40.0-44.9, adult (HCC)       Relevant Medications   Semaglutide-Weight Management (WEGOVY) 0.5 MG/0.5ML SOAJ       No data recorded No data recorded  No data recorded No data recorded   ASSESSMENT AND PLAN:  Diet: Deanna Schwartz is currently in the action stage of change. As such, her goal is to continue with weight loss efforts. She has agreed to Category 3 Plan.  Exercise: Deanna Schwartz has been instructed that some exercise is better than none for weight loss and overall health benefits.   Behavior Modification:  We discussed the following Behavioral Modification Strategies today: increasing lean protein intake, increasing vegetables, no skipping meals, meal planning and cooking strategies, holiday eating strategies, and planning for success. We discussed various medication options to help [facility] with her weight loss efforts and we both agreed to refill Wegovy at 0.5mg  weekly.  No follow-ups on file.[name] She was informed of the importance of frequent follow up visits to maximize her success with intensive lifestyle modifications for her multiple health conditions.  Attestation Statements:   Reviewed by clinician on day of visit:  allergies, medications, problem list, medical history, surgical history, family history, social history, and previous encounter notes.    Reuben Likes, MD

## 2023-08-13 ENCOUNTER — Encounter (INDEPENDENT_AMBULATORY_CARE_PROVIDER_SITE_OTHER): Payer: Self-pay

## 2023-08-17 IMAGING — DX DG KNEE AP/LAT W/ SUNRISE*R*
3 series · 3 of 3 positions shown · non-contrast
Comparison: None.

CLINICAL DATA: Right knee pain, no known injury, initial encounter

EXAM:
RIGHT KNEE 3 VIEWS

[knee ap]
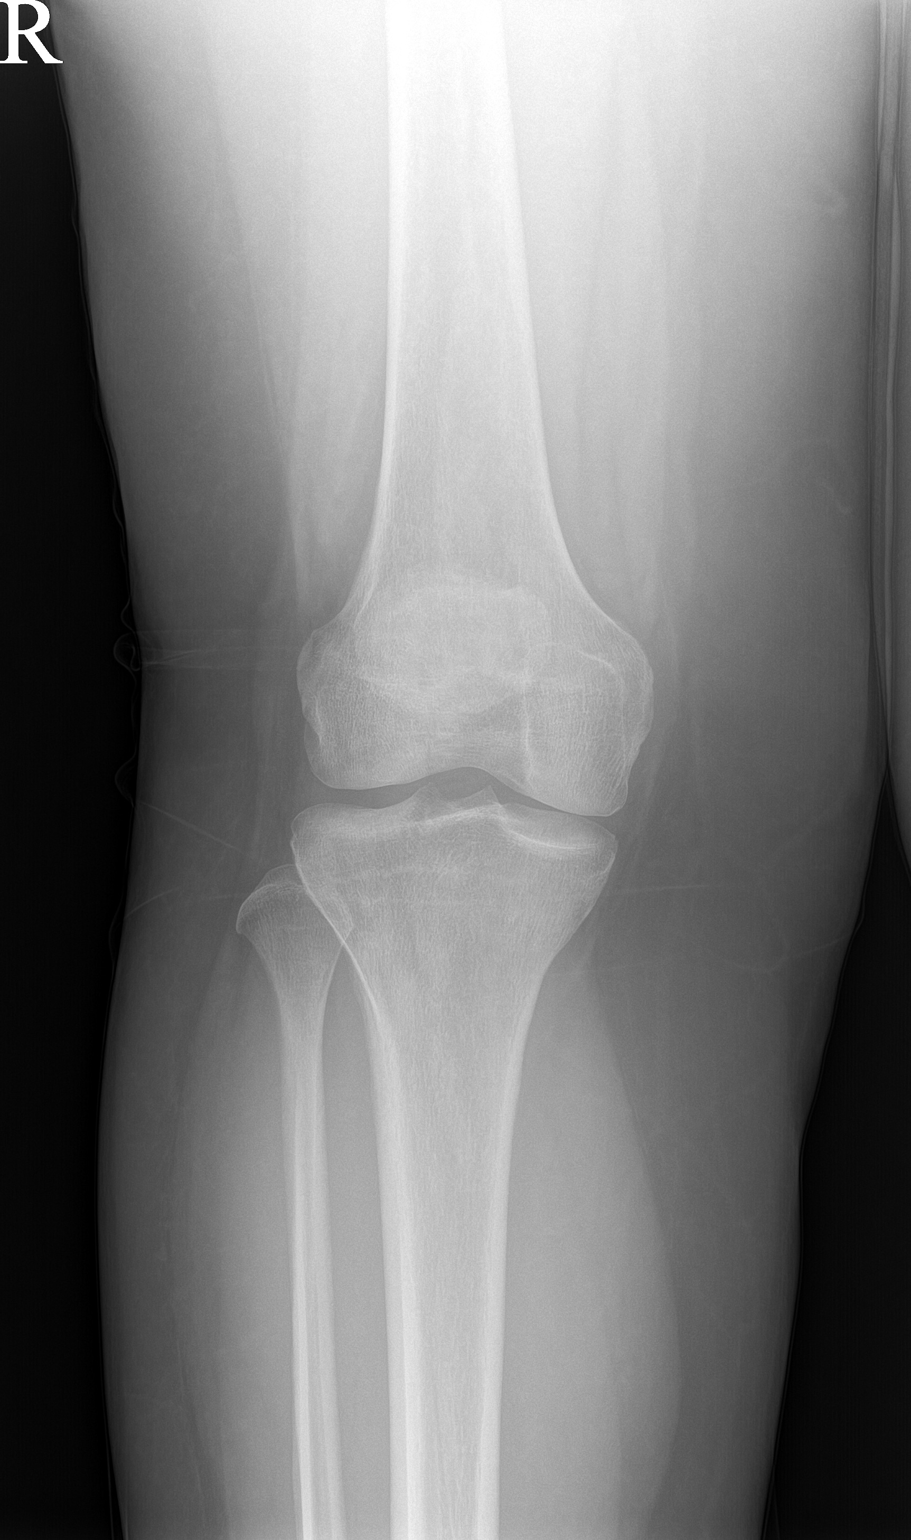

[knee lat]
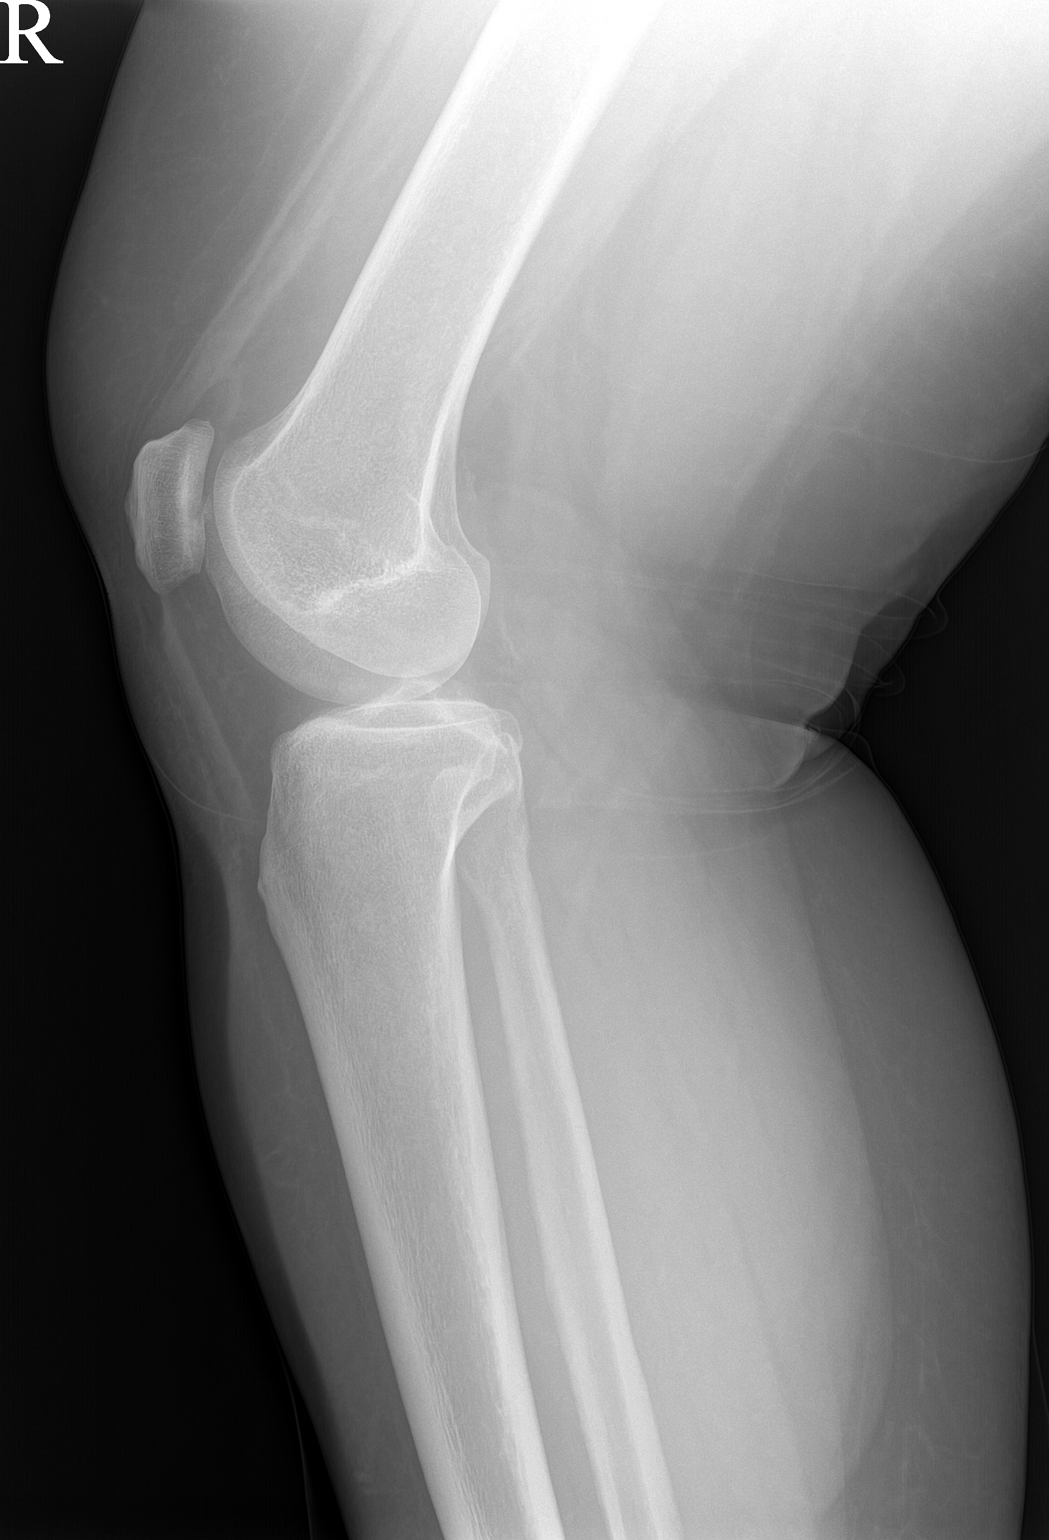

[patella]
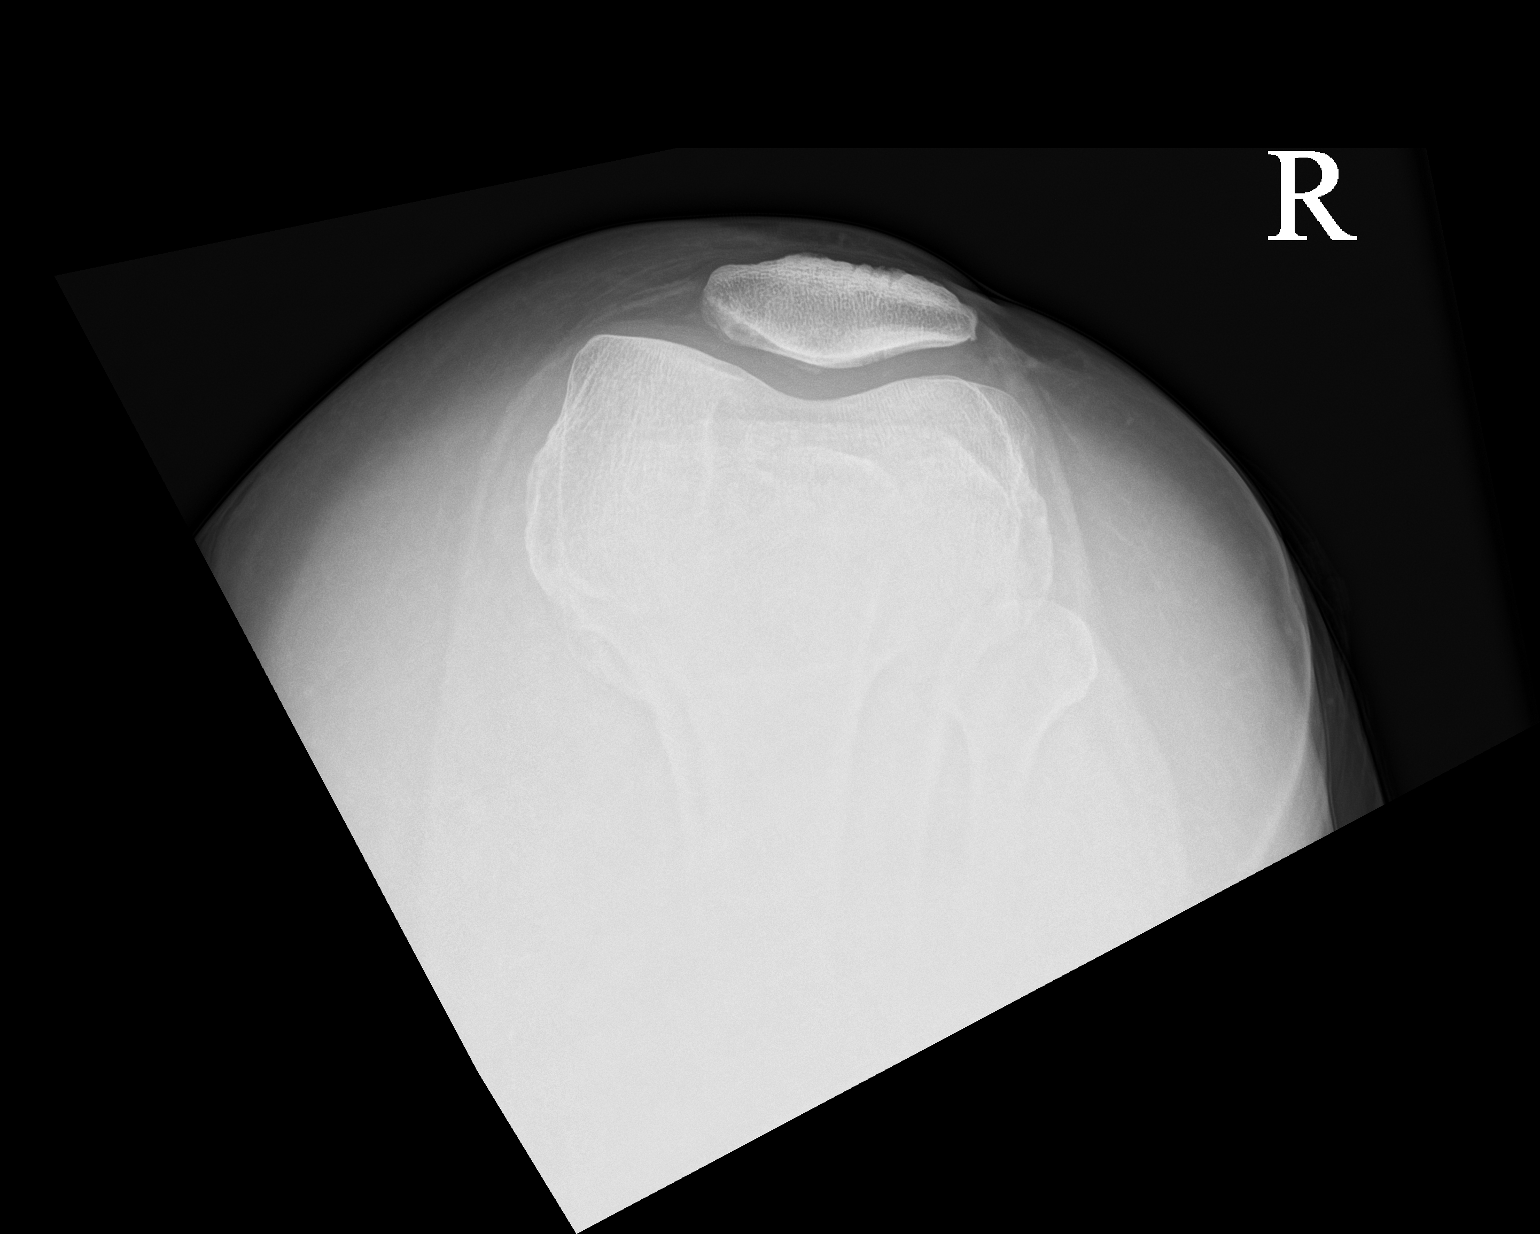

[3 of 3 positions shown; findings below may reference images not displayed]

FINDINGS: No evidence of fracture, dislocation, or joint effusion. No evidence
of arthropathy or other focal bone abnormality. Soft tissues are
unremarkable.
IMPRESSION: No acute abnormality noted.

## 2023-08-17 IMAGING — DX DG KNEE AP/LAT W/ SUNRISE*L*
3 series · 3 of 3 positions shown · non-contrast
Comparison: Bilateral knee x-ray 04/07/2014.

CLINICAL DATA: Bilateral knee pain.

EXAM:
LEFT KNEE 3 VIEWS

[knee ap]
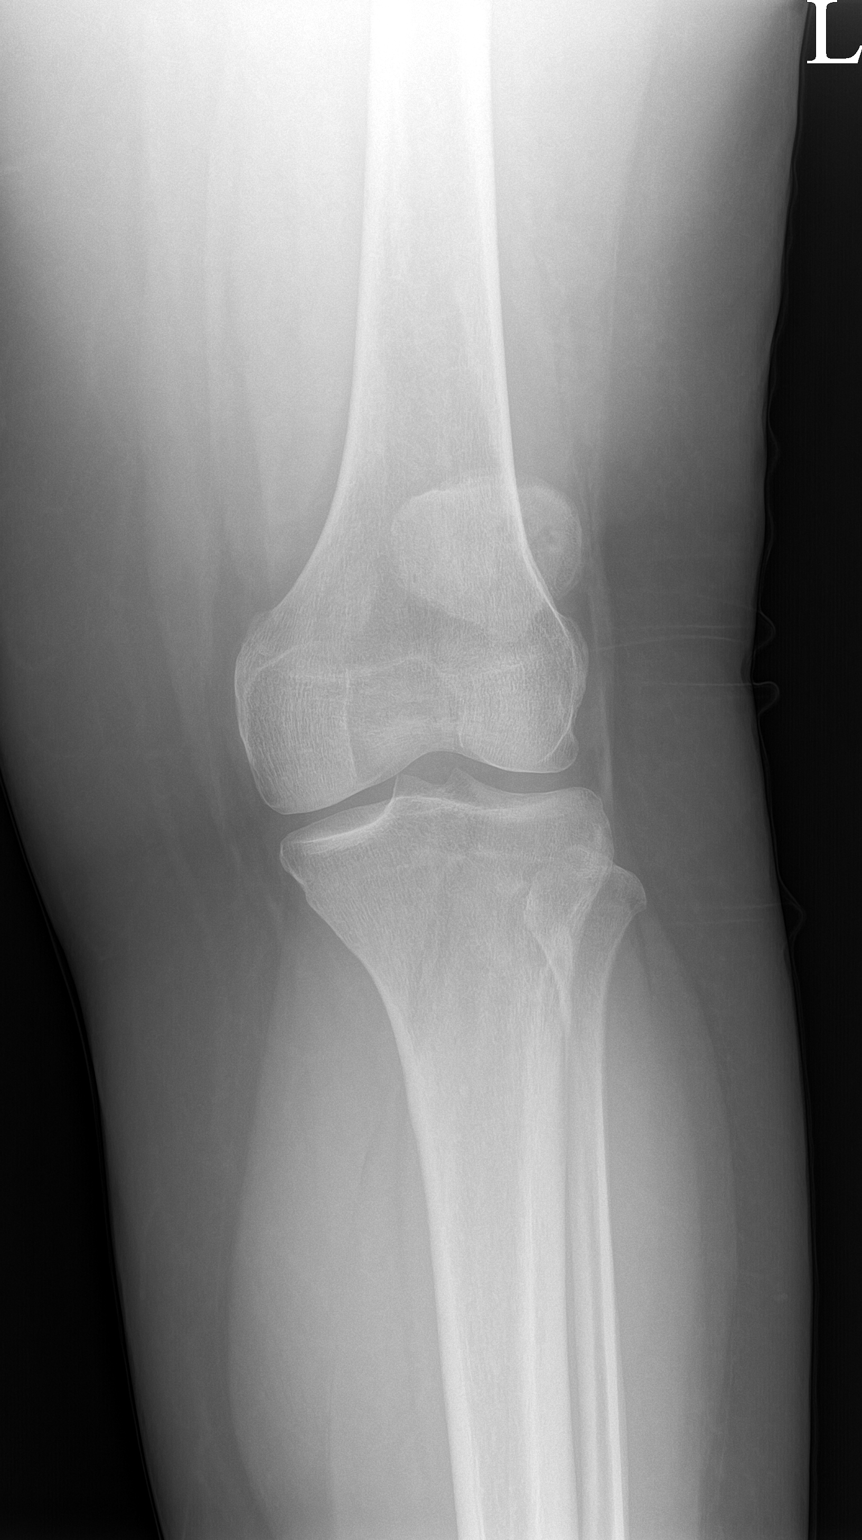

[knee lat]
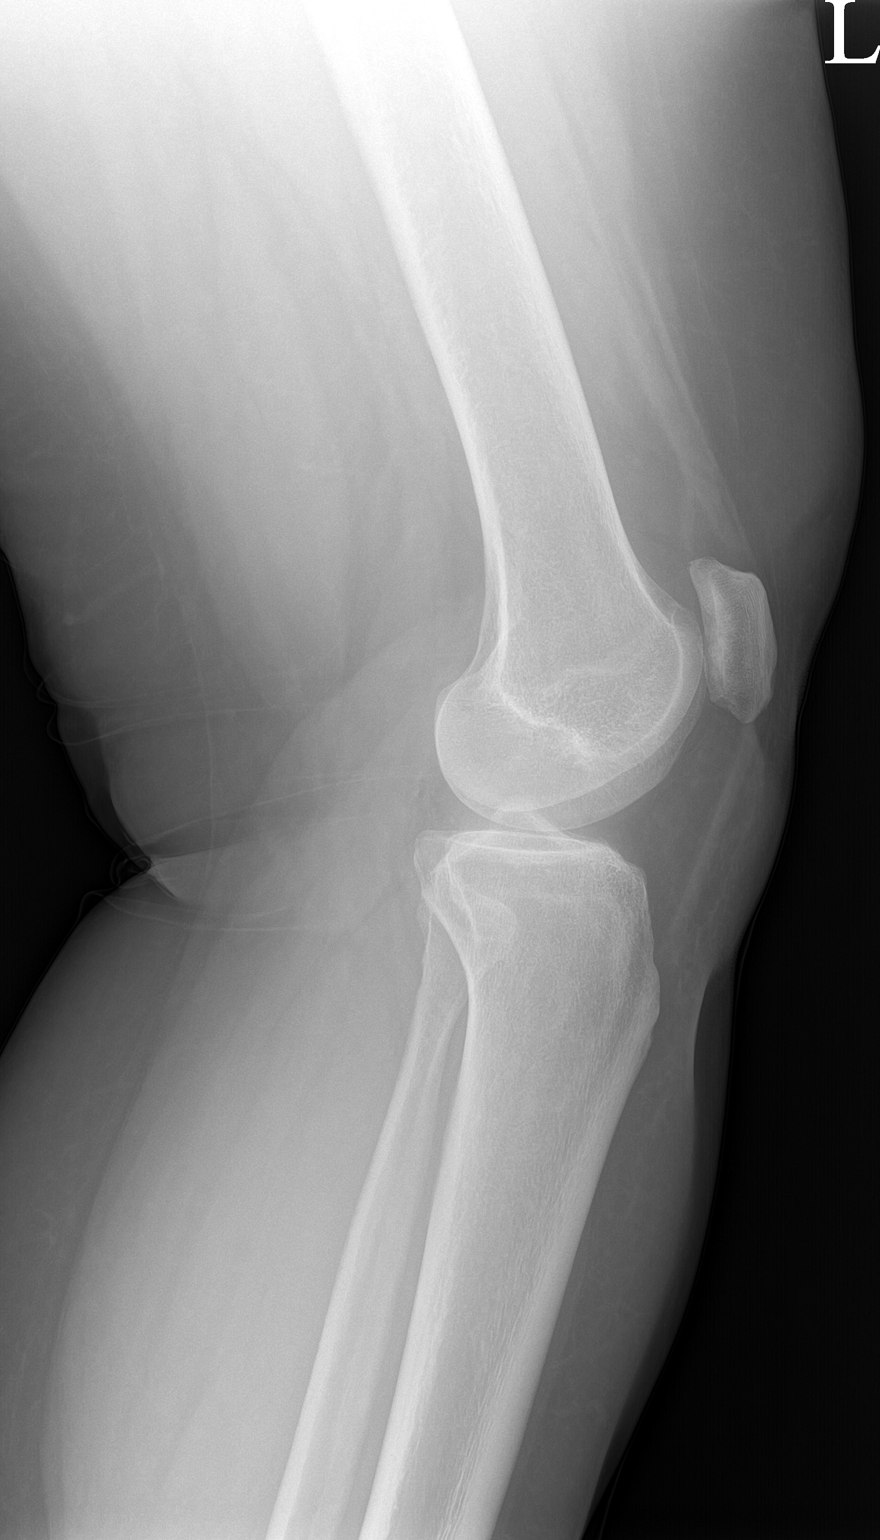

[patella]
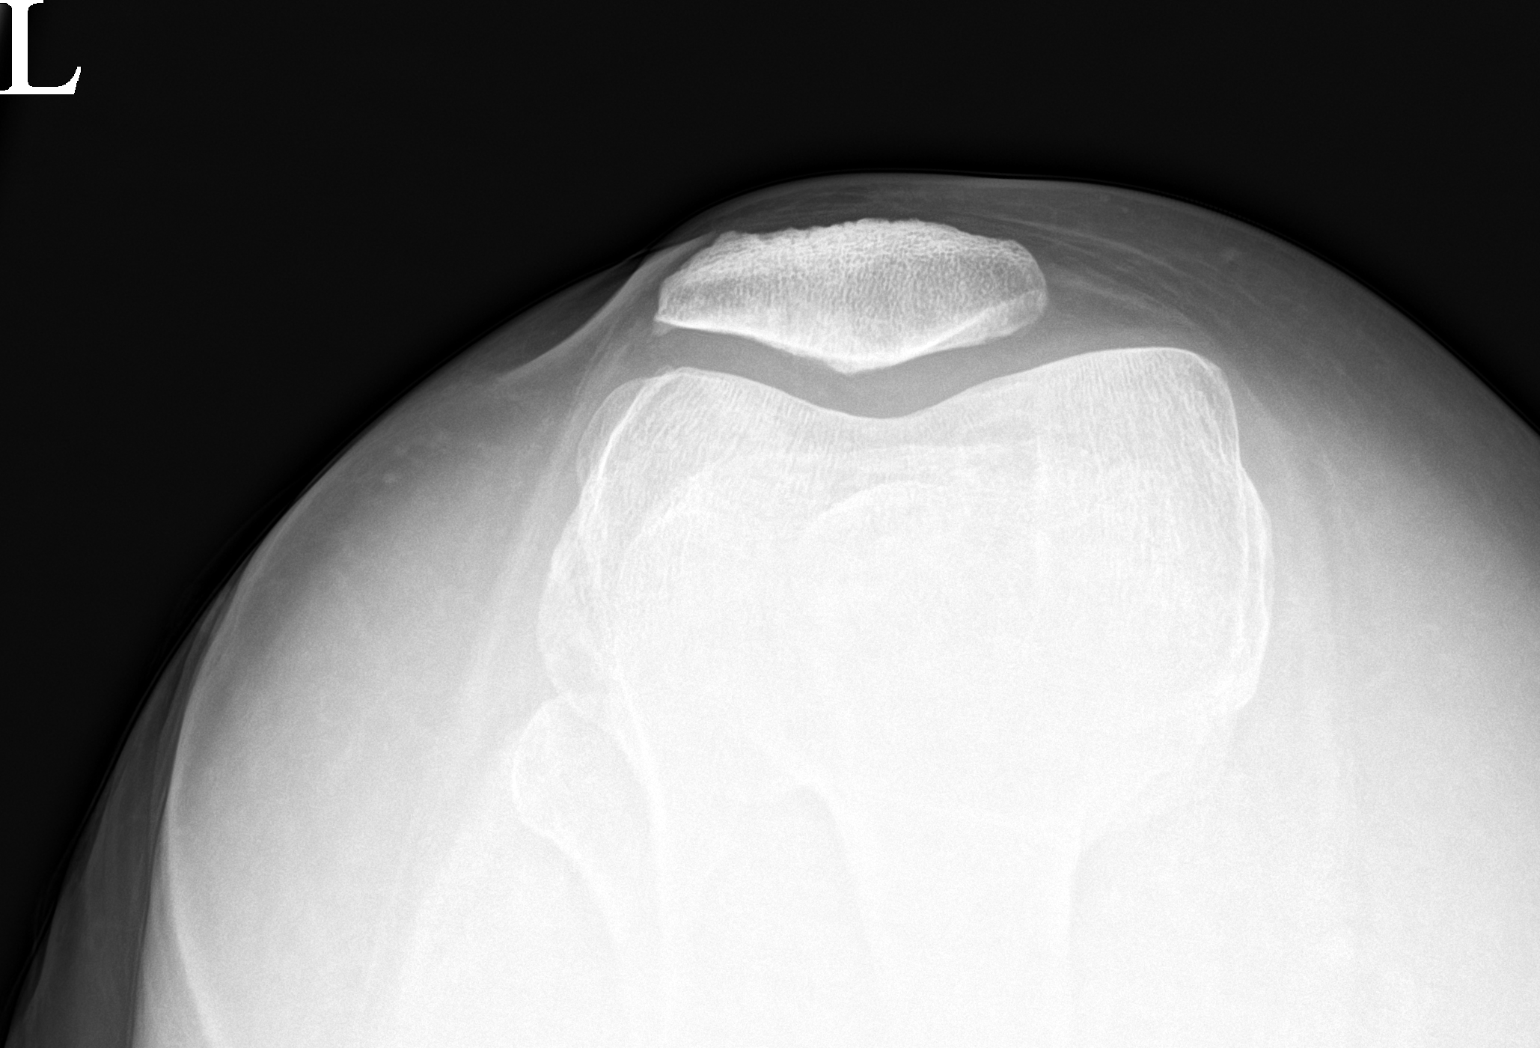

[3 of 3 positions shown; findings below may reference images not displayed]

FINDINGS: No evidence of fracture, dislocation, or joint effusion. No evidence
of arthropathy or other focal bone abnormality. Soft tissues are
unremarkable.
IMPRESSION: Negative.

## 2023-08-18 DIAGNOSIS — E88819 Insulin resistance, unspecified: Secondary | ICD-10-CM | POA: Diagnosis not present

## 2023-08-18 DIAGNOSIS — E559 Vitamin D deficiency, unspecified: Secondary | ICD-10-CM | POA: Diagnosis not present

## 2023-08-19 ENCOUNTER — Other Ambulatory Visit (HOSPITAL_COMMUNITY): Payer: Self-pay

## 2023-08-19 LAB — COMPREHENSIVE METABOLIC PANEL
ALT: 21 [IU]/L (ref 0–32)
AST: 19 [IU]/L (ref 0–40)
Albumin: 4.1 g/dL (ref 3.9–4.9)
Alkaline Phosphatase: 73 [IU]/L (ref 44–121)
BUN/Creatinine Ratio: 14 (ref 9–23)
BUN: 11 mg/dL (ref 6–24)
Bilirubin Total: 0.4 mg/dL (ref 0.0–1.2)
CO2: 24 mmol/L (ref 20–29)
Calcium: 9.2 mg/dL (ref 8.7–10.2)
Chloride: 101 mmol/L (ref 96–106)
Creatinine, Ser: 0.79 mg/dL (ref 0.57–1.00)
Globulin, Total: 2.8 g/dL (ref 1.5–4.5)
Glucose: 83 mg/dL (ref 70–99)
Potassium: 4.2 mmol/L (ref 3.5–5.2)
Sodium: 138 mmol/L (ref 134–144)
Total Protein: 6.9 g/dL (ref 6.0–8.5)
eGFR: 96 mL/min/{1.73_m2} (ref >59)

## 2023-08-19 LAB — LIPID PANEL WITH LDL/HDL RATIO
Cholesterol, Total: 215 mg/dL — ABNORMAL HIGH (ref 100–199)
HDL: 50 mg/dL (ref >39)
LDL Chol Calc (NIH): 112 mg/dL — ABNORMAL HIGH (ref 0–99)
LDL/HDL Ratio: 2.2 {ratio} (ref 0.0–3.2)
Triglycerides: 309 mg/dL — ABNORMAL HIGH (ref 0–149)
VLDL Cholesterol Cal: 53 mg/dL — ABNORMAL HIGH (ref 5–40)

## 2023-08-19 LAB — VITAMIN D 25 HYDROXY (VIT D DEFICIENCY, FRACTURES): Vit D, 25-Hydroxy: 25 ng/mL — ABNORMAL LOW (ref 30.0–100.0)

## 2023-08-19 LAB — HEMOGLOBIN A1C
Est. average glucose Bld gHb Est-mCnc: 108 mg/dL
Hgb A1c MFr Bld: 5.4 % (ref 4.8–5.6)

## 2023-08-20 ENCOUNTER — Encounter (INDEPENDENT_AMBULATORY_CARE_PROVIDER_SITE_OTHER): Payer: Self-pay | Admitting: Family Medicine

## 2023-08-20 DIAGNOSIS — E7849 Other hyperlipidemia: Secondary | ICD-10-CM

## 2023-08-20 DIAGNOSIS — E88819 Insulin resistance, unspecified: Secondary | ICD-10-CM | POA: Insufficient documentation

## 2023-08-20 HISTORY — DX: Other hyperlipidemia: E78.49

## 2023-08-20 NOTE — Assessment & Plan Note (Signed)
Patient is on GLP1 medication.  She is working on increasing total amount of food daily and getting macronutrient composition in the ratios we discussed.  Needs repeat labs done when she is fasting- labs were ordered today but patient will get them done when she is fasting.

## 2023-08-20 NOTE — Assessment & Plan Note (Signed)
Patient last Vitamin D level was 31.8 in May of this year.  She has been on supplemental Vitamin d since that time.  Vitamin D level ordered today.

## 2023-08-20 NOTE — Assessment & Plan Note (Signed)
Last labs done in August of this year.  Patient not on statin.  Needs repeat FLP- ordered today. Patient to come get labs when she is fasting prior to next appointment.

## 2023-09-03 ENCOUNTER — Other Ambulatory Visit (HOSPITAL_COMMUNITY): Payer: Self-pay

## 2023-09-03 DIAGNOSIS — L400 Psoriasis vulgaris: Secondary | ICD-10-CM | POA: Diagnosis not present

## 2023-09-03 DIAGNOSIS — L218 Other seborrheic dermatitis: Secondary | ICD-10-CM | POA: Diagnosis not present

## 2023-09-03 MED ORDER — FLUOCINONIDE 0.05 % EX SOLN
1.0000 | Freq: Every day | CUTANEOUS | 3 refills | Status: AC | PRN
  Filled 2023-09-03: qty 60, 30d supply, fill #0
  Filled 2023-09-29: qty 60, 30d supply, fill #1
  Filled 2023-11-08: qty 60, 30d supply, fill #2
  Filled 2023-12-15: qty 60, 30d supply, fill #3

## 2023-09-03 MED ORDER — KETOCONAZOLE 2 % EX SHAM
1.0000 | MEDICATED_SHAMPOO | CUTANEOUS | 11 refills | Status: AC
  Filled 2023-09-03: qty 120, 78d supply, fill #0
  Filled 2023-09-29 – 2023-11-08 (×2): qty 120, 30d supply, fill #1
  Filled 2023-12-15: qty 120, 30d supply, fill #2
  Filled 2024-01-25 (×2): qty 120, 30d supply, fill #3
  Filled 2024-04-05: qty 120, 30d supply, fill #4
  Filled 2024-07-04: qty 120, 30d supply, fill #5

## 2023-09-07 ENCOUNTER — Encounter: Payer: Self-pay | Admitting: Internal Medicine

## 2023-09-09 ENCOUNTER — Other Ambulatory Visit: Payer: Self-pay | Admitting: Internal Medicine

## 2023-09-09 ENCOUNTER — Encounter: Payer: Self-pay | Admitting: Oncology

## 2023-09-09 ENCOUNTER — Other Ambulatory Visit (HOSPITAL_COMMUNITY): Payer: Self-pay

## 2023-09-09 MED ORDER — METHYLPREDNISOLONE 4 MG PO TBPK
ORAL_TABLET | ORAL | 0 refills | Status: DC
  Filled 2023-09-09 – 2023-09-11 (×2): qty 21, 6d supply, fill #0

## 2023-09-09 MED ORDER — LEVOCETIRIZINE DIHYDROCHLORIDE 5 MG PO TABS
5.0000 mg | ORAL_TABLET | Freq: Every evening | ORAL | 0 refills | Status: DC
  Filled 2023-09-09 – 2023-09-11 (×2): qty 90, 90d supply, fill #0

## 2023-09-10 ENCOUNTER — Other Ambulatory Visit: Payer: Self-pay

## 2023-09-11 ENCOUNTER — Other Ambulatory Visit: Payer: Self-pay

## 2023-09-11 ENCOUNTER — Other Ambulatory Visit (HOSPITAL_COMMUNITY): Payer: Self-pay

## 2023-09-14 ENCOUNTER — Encounter: Payer: Self-pay | Admitting: Oncology

## 2023-09-15 ENCOUNTER — Other Ambulatory Visit: Payer: Self-pay

## 2023-09-21 ENCOUNTER — Ambulatory Visit (INDEPENDENT_AMBULATORY_CARE_PROVIDER_SITE_OTHER): Payer: 59 | Admitting: Family Medicine

## 2023-09-28 ENCOUNTER — Ambulatory Visit (INDEPENDENT_AMBULATORY_CARE_PROVIDER_SITE_OTHER): Payer: Commercial Managed Care - PPO | Admitting: Family Medicine

## 2023-09-29 ENCOUNTER — Encounter (HOSPITAL_COMMUNITY): Payer: Self-pay | Admitting: Pharmacist

## 2023-09-29 ENCOUNTER — Other Ambulatory Visit: Payer: Self-pay

## 2023-09-29 ENCOUNTER — Other Ambulatory Visit (INDEPENDENT_AMBULATORY_CARE_PROVIDER_SITE_OTHER): Payer: Self-pay | Admitting: Family Medicine

## 2023-09-29 ENCOUNTER — Encounter: Payer: Self-pay | Admitting: Oncology

## 2023-09-29 ENCOUNTER — Other Ambulatory Visit (HOSPITAL_COMMUNITY): Payer: Self-pay

## 2023-09-29 DIAGNOSIS — E559 Vitamin D deficiency, unspecified: Secondary | ICD-10-CM

## 2023-10-14 ENCOUNTER — Ambulatory Visit (INDEPENDENT_AMBULATORY_CARE_PROVIDER_SITE_OTHER): Payer: Commercial Managed Care - PPO | Admitting: Family Medicine

## 2023-10-15 ENCOUNTER — Ambulatory Visit (INDEPENDENT_AMBULATORY_CARE_PROVIDER_SITE_OTHER): Payer: Commercial Managed Care - PPO | Admitting: Family Medicine

## 2023-10-15 ENCOUNTER — Other Ambulatory Visit (HOSPITAL_COMMUNITY): Payer: Self-pay

## 2023-10-15 ENCOUNTER — Other Ambulatory Visit: Payer: Self-pay

## 2023-10-15 VITALS — BP 116/75 | HR 84 | Temp 98.5°F | Ht 63.0 in | Wt 259.0 lb

## 2023-10-15 DIAGNOSIS — Z6841 Body Mass Index (BMI) 40.0 and over, adult: Secondary | ICD-10-CM

## 2023-10-15 DIAGNOSIS — E7849 Other hyperlipidemia: Secondary | ICD-10-CM | POA: Diagnosis not present

## 2023-10-15 DIAGNOSIS — E559 Vitamin D deficiency, unspecified: Secondary | ICD-10-CM

## 2023-10-15 MED ORDER — VITAMIN D (ERGOCALCIFEROL) 1.25 MG (50000 UNIT) PO CAPS
50000.0000 [IU] | ORAL_CAPSULE | ORAL | 0 refills | Status: DC
Start: 2023-10-15 — End: 2024-02-08
  Filled 2023-10-15: qty 12, 84d supply, fill #0

## 2023-10-15 NOTE — Assessment & Plan Note (Addendum)
 The 10-year ASCVD risk score (Arnett DK, et al., 2019) is: 0.7%   Values used to calculate the score:     Age: 43 years     Sex: Female     Is Non-Hispanic African American: No     Diabetic: No     Tobacco smoker: No     Systolic Blood Pressure: 116 mmHg     Is BP treated: No     HDL Cholesterol: 50 mg/dL     Total Cholesterol: 215 mg/dL  Discussed labs with patient.  She is not at high enough risk to need medication for her hypercholesterolemia.  We discussed how to limit saturated fat intake and will need repeat fasting lipid panel in April 2025.

## 2023-10-15 NOTE — Progress Notes (Signed)
 SUBJECTIVE:  Chief Complaint: Obesity  Interim History: Patient had a good holiday season- stayed local and her daughter got sick with what she thinks was the flu and then stomach virus. She is finally able to eat all the meat on the Category 3- she is doing beans, meat, vegetables and salad with her meal.  She is walking 30 minutes 5 times a week.  She is not able to take the wegovy  anymore.  She is eating full option of breakfast, eats lunch later in the day and is drinking a protein shake- natural slim (200 calories and 25 grams of protein).  No upcoming plans the next few weeks.  Deanna Schwartz is here to discuss her progress with her obesity treatment plan. She is on the Category 3 Plan and states she is following her eating plan approximately 50 % of the time. She states she is exercising 3 minutes 5 times per week.   OBJECTIVE: Visit Diagnoses: Problem List Items Addressed This Visit       Other   Vitamin D  deficiency - Primary   Vitamin D  level in December 24 not even half of goal level.  Patient needs a refill on her prescription strength vitamin D  at this time.  She denies nausea, vomiting, muscle weakness.  Will need a repeat level done in April to assess whether or not patient is capable of absorbing enough vitamin D3 from the supplementation otherwise we will need to make a change to vitamin D2.      Relevant Medications   Vitamin D , Ergocalciferol , (DRISDOL ) 1.25 MG (50000 UNIT) CAPS capsule   Morbid obesity (HCC)   Starting weight: 254 on 01/07/23 Peak weight: 262 BMR: 1786 on 01/07/23 Previous obesity management: On Wegovy  which helped with food noise and control of choice of food intake Body Fat %: 47.4% Starting Meal Plan: Cat 3        Other hyperlipidemia   The 10-year ASCVD risk score (Arnett DK, et al., 2019) is: 0.7%   Values used to calculate the score:     Age: 43 years     Sex: Female     Is Non-Hispanic African American: No     Diabetic: No     Tobacco  smoker: No     Systolic Blood Pressure: 116 mmHg     Is BP treated: No     HDL Cholesterol: 50 mg/dL     Total Cholesterol: 215 mg/dL  Discussed labs with patient.  She is not at high enough risk to need medication for her hypercholesterolemia.  We discussed how to limit saturated fat intake and will need repeat fasting lipid panel in April 2025.       Other Visit Diagnoses       Obesity, starting BMI 43.60         BMI 45.0-49.9, adult (HCC)           No data recorded  No data recorded  No data recorded  No data recorded    ASSESSMENT AND PLAN:  Diet: Deanna Schwartz is currently in the action stage of change. As such, her goal is to continue with weight loss efforts. She has agreed to Category 3 Plan.  Exercise: Deanna Schwartz has been instructed that some exercise is better than none and to continue exercising as is for weight loss and overall health benefits.   Behavior Modification:  We discussed the following Behavioral Modification Strategies today: increasing lean protein intake, increasing vegetables, meal planning and cooking strategies, and better  snacking choices.   No follow-ups on file.Deanna Schwartz She was informed of the importance of frequent follow up visits to maximize her success with intensive lifestyle modifications for her multiple health conditions.  Attestation Statements:   Reviewed by clinician on day of visit: allergies, medications, problem list, medical history, surgical history, family history, social history, and previous encounter notes.     Deanna Cho, MD

## 2023-10-20 NOTE — Assessment & Plan Note (Signed)
Vitamin D level in December 24 not even half of goal level.  Patient needs a refill on her prescription strength vitamin D at this time.  She denies nausea, vomiting, muscle weakness.  Will need a repeat level done in April to assess whether or not patient is capable of absorbing enough vitamin D3 from the supplementation otherwise we will need to make a change to vitamin D2.

## 2023-10-20 NOTE — Assessment & Plan Note (Addendum)
Starting weight: 254 on 01/07/23 Peak weight: 262 BMR: 1786 on 01/07/23 Previous obesity management: On Wegovy which helped with food noise and control of choice of food intake Body Fat %: 47.4% Starting Meal Plan: Cat 3

## 2023-11-06 ENCOUNTER — Other Ambulatory Visit: Payer: Self-pay | Admitting: Medical Genetics

## 2023-11-08 ENCOUNTER — Other Ambulatory Visit: Payer: Self-pay | Admitting: Internal Medicine

## 2023-11-09 ENCOUNTER — Other Ambulatory Visit: Payer: Self-pay

## 2023-11-09 ENCOUNTER — Other Ambulatory Visit (HOSPITAL_COMMUNITY): Payer: Self-pay

## 2023-11-09 MED ORDER — LEVOCETIRIZINE DIHYDROCHLORIDE 5 MG PO TABS
5.0000 mg | ORAL_TABLET | Freq: Every evening | ORAL | 0 refills | Status: DC
  Filled 2023-11-09 – 2023-12-07 (×2): qty 90, 90d supply, fill #0

## 2023-11-10 ENCOUNTER — Ambulatory Visit (INDEPENDENT_AMBULATORY_CARE_PROVIDER_SITE_OTHER): Payer: Commercial Managed Care - PPO | Admitting: Family Medicine

## 2023-12-07 ENCOUNTER — Other Ambulatory Visit (HOSPITAL_COMMUNITY): Payer: Self-pay

## 2023-12-15 ENCOUNTER — Other Ambulatory Visit (HOSPITAL_COMMUNITY): Payer: Self-pay

## 2023-12-23 ENCOUNTER — Other Ambulatory Visit: Payer: Self-pay

## 2023-12-23 ENCOUNTER — Other Ambulatory Visit (HOSPITAL_COMMUNITY): Payer: Self-pay

## 2023-12-23 ENCOUNTER — Ambulatory Visit: Admitting: Nurse Practitioner

## 2023-12-23 ENCOUNTER — Ambulatory Visit: Payer: Self-pay

## 2023-12-23 VITALS — BP 112/72 | HR 114 | Temp 97.9°F | Ht 63.0 in | Wt 264.1 lb

## 2023-12-23 DIAGNOSIS — J45901 Unspecified asthma with (acute) exacerbation: Secondary | ICD-10-CM | POA: Insufficient documentation

## 2023-12-23 MED ORDER — MONTELUKAST SODIUM 10 MG PO TABS
10.0000 mg | ORAL_TABLET | Freq: Every day | ORAL | 3 refills | Status: AC
Start: 2023-12-23 — End: ?
  Filled 2023-12-23 (×2): qty 30, 30d supply, fill #0
  Filled 2024-01-25 (×2): qty 30, 30d supply, fill #1
  Filled 2024-04-05: qty 30, 30d supply, fill #2

## 2023-12-23 MED ORDER — PREDNISONE 20 MG PO TABS
40.0000 mg | ORAL_TABLET | Freq: Every day | ORAL | 0 refills | Status: DC
Start: 2023-12-23 — End: 2024-02-05
  Filled 2023-12-23 (×2): qty 14, 7d supply, fill #0

## 2023-12-23 MED ORDER — AZITHROMYCIN 250 MG PO TABS
ORAL_TABLET | ORAL | 0 refills | Status: AC
Start: 2023-12-23 — End: 2023-12-28
  Filled 2023-12-23 (×2): qty 6, 5d supply, fill #0

## 2023-12-23 NOTE — Progress Notes (Signed)
   Established Patient Office Visit  Subjective   Patient ID: Deanna Schwartz, female    DOB: November 27, 1980  Age: 43 y.o. MRN: [id number]  Chief Complaint  Patient presents with   Asthma    Asthma: Chronic.  Reports over the last 3 weeks she has had flareup in wheezing and coughing.  Matilde Bash as needed for rescue inhaler, Symbicort daily for maintenance.  Reports last exacerbation was 1 year ago.  Currently is experiencing cough, wheeze, shortness of breath, sinus pressure/pain, nasal discharge.  She also reports she has seasonal allergies, currently treating with Xyzal.  Would like to restart Singulair, reports taking this in the past and denies any suicidal thoughts or vivid dreams as a side effect.    ROS: see HPI    Objective:     BP 112/72   Pulse (!) 114   Temp 97.9 F (36.6 C) (Temporal)   Ht 5\' 3"  (1.6 m)   Wt 264 lb 2 oz (119.8 kg)   LMP 09/13/2022 (Exact Date)   SpO2 97%   BMI 46.79 kg/m  BP Readings from Last 3 Encounters:  12/23/23 112/72  10/15/23 116/75  08/12/23 121/78   Wt Readings from Last 3 Encounters:  12/23/23 264 lb 2 oz (119.8 kg)  10/15/23 259 lb (117.5 kg)  08/12/23 258 lb (117 kg)      Physical Exam Vitals reviewed.  Constitutional:      General: She is not in acute distress.    Appearance: Normal appearance.  HENT:     Head: Normocephalic and atraumatic.  Neck:     Vascular: No carotid bruit.  Cardiovascular:     Rate and Rhythm: Normal rate and regular rhythm.     Pulses: Normal pulses.     Heart sounds: Normal heart sounds.  Pulmonary:     Effort: Pulmonary effort is normal.     Breath sounds: Wheezing present.     Comments: Wheezing throughout lung fields.  Skin:    General: Skin is warm and dry.  Neurological:     General: No focal deficit present.     Mental Status: She is alert and oriented to person, place, and time.  Psychiatric:        Mood and Affect: Mood normal.        Behavior: Behavior normal.         Judgment: Judgment normal.      No results found for any visits on 12/23/23.    The 10-year ASCVD risk score (Arnett DK, et al., 2019) is: 0.7%    Assessment & Plan:   Problem List Items Addressed This Visit       Respiratory   Exacerbation of asthma - Primary   Acute, vital signs stable, SpO2 97% on room air Offered breathing treatment in office, patient declined Treat with prednisone 40 mg daily x 7 days Continue Airsupra for rescue inhaler as needed, continue Symbicort for maintenance Will reinitiate her on montelukast 10 mg daily, continue Xyzal 5 mg every evening as well. Concern for possible secondary bacterial sinusitis.  Treat with Z-Pak. Return to clinic if symptoms persist or worsen.      Relevant Medications   predniSONE (DELTASONE) 20 MG tablet   montelukast (SINGULAIR) 10 MG tablet   azithromycin (ZITHROMAX) 250 MG tablet    Return if symptoms worsen or fail to improve.    Elenore Paddy, NP

## 2023-12-23 NOTE — Assessment & Plan Note (Signed)
 Acute, vital signs stable, SpO2 97% on room air Offered breathing treatment in office, patient declined Treat with prednisone 40 mg daily x 7 days Continue Airsupra for rescue inhaler as needed, continue Symbicort for maintenance Will reinitiate her on montelukast 10 mg daily, continue Xyzal 5 mg every evening as well. Concern for possible secondary bacterial sinusitis.  Treat with Z-Pak. Return to clinic if symptoms persist or worsen.

## 2023-12-23 NOTE — Telephone Encounter (Signed)
 Copied from CRM [id number]. Topic: Clinical - Red Word Triage >> Dec 23, 2023  9:48 AM Deanna Schwartz wrote: [facility] that prompted transfer to Nurse Triage: Asthma flare up. Patient stated it has progressively gotten worse over the past week and she is wheezing. Patient also stated her inhaler is not helping.  Chief Complaint: asthma flare up Symptoms: SOB at times and wheezing  Frequency: 3 days  Pertinent Negatives: NA Disposition: [] ED /[] Urgent Care (no appt availability in office) / [x] Appointment(In office/virtual)/ []  [facility]/ [] Home Care/ [] Refused Recommended Disposition /[] [facility]/ []  Follow-up with PCP Additional Notes: pt states she feels like she having asthma flare up d/t allergies possibly. Has been using Airsupra inhaler every 2 hours and gets relief but not lasting long like normal. Pt also using Symbicort as prescribed. Scheduled OV today at 1120 with Abraham Hoffmann, NP.   Reason for Disposition . [1] Continuous (nonstop) coughing AND [2] keeps from working or sleeping AND [3] not improved after 2 or 3 inhaler or nebulizer treatments given 20 minutes apart  Answer Assessment - Initial Assessment Questions 1. RESPIRATORY STATUS: "Describe your breathing?" (Schwartz.g., wheezing, shortness of breath, unable to speak, severe coughing)      SOB and wheezing  2. ONSET: "When did this asthma attack begin?"      3 days  3. TRIGGER: "What do you think triggered this attack?" (Schwartz.g., URI, exposure to pollen or other allergen, tobacco smoke)      Allergies  5. SEVERITY: "How bad is this attack?"    - MILD: No SOB at rest, mild SOB with walking, speaks normally in sentences, can lie down, no retractions, pulse < 100. (GREEN Zone: PEFR 80-100%)   - MODERATE: SOB at rest, SOB with minimal exertion and prefers to sit, cannot lie down flat, speaks in phrases, mild retractions, audible wheezing, pulse 100-120. (YELLOW Zone: PEFR 50-79%)    - SEVERE: Struggling for each breath,  speaks in single words, struggling to breathe, sitting hunched forward, retractions, usually loud wheezing, sometimes minimal wheezing because of decreased air movement, pulse > 120. (RED Zone: PEFR < 50%).      Mild  6. ASTHMA MEDICINES:  "What treatments have you tried?"    - INHALED QUICK RELIEF (RESCUE): "What is your inhaled quick-relief medicine?" (Schwartz.g., albuterol, salbutamol) "Do you use an inhaler or a nebulizer?" "How frequently have you been using this medicine?"   - CONTROLLER (LONG-TERM-CONTROL): "Do you take an inhaled steroid? (Schwartz.g., Asmanex, Flovent, Pulmicort, Qvar)     Both inhalers  7. INHALED QUICK-RELIEF TREATMENTS FOR THIS ATTACK: "What treatments have you given yourself so far?" and "How many and how often?" If using an inhaler, ask, "How many puffs?" Note: Routine treatments are 2 puffs every 4 hours as needed. Rescue treatments are 4 puffs repeated every 20 minutes, up to three times as needed.      Having to use inhaler every 2 hours  8. OTHER SYMPTOMS: "Do you have any other symptoms? (Schwartz.g., chest pain, coughing up yellow sputum, fever, runny nose)     Wheezing  Protocols used: Asthma Attack-A-AH

## 2023-12-24 ENCOUNTER — Other Ambulatory Visit (HOSPITAL_COMMUNITY): Payer: Self-pay

## 2023-12-24 ENCOUNTER — Other Ambulatory Visit: Payer: Self-pay

## 2023-12-31 ENCOUNTER — Other Ambulatory Visit (HOSPITAL_COMMUNITY): Payer: Self-pay

## 2024-01-07 ENCOUNTER — Encounter: Payer: Self-pay | Admitting: Internal Medicine

## 2024-01-07 ENCOUNTER — Other Ambulatory Visit (HOSPITAL_COMMUNITY): Payer: Self-pay

## 2024-01-07 ENCOUNTER — Other Ambulatory Visit: Payer: Self-pay | Admitting: Internal Medicine

## 2024-01-07 MED ORDER — ALBUTEROL SULFATE HFA 108 (90 BASE) MCG/ACT IN AERS
1.0000 | INHALATION_SPRAY | Freq: Four times a day (QID) | RESPIRATORY_TRACT | 2 refills | Status: DC | PRN
  Filled 2024-01-07: qty 6.7, 25d supply, fill #0
  Filled 2024-01-25 – 2024-01-28 (×4): qty 6.7, 25d supply, fill #1

## 2024-01-08 ENCOUNTER — Other Ambulatory Visit: Payer: Self-pay

## 2024-01-08 ENCOUNTER — Other Ambulatory Visit (HOSPITAL_COMMUNITY): Payer: Self-pay

## 2024-01-25 ENCOUNTER — Other Ambulatory Visit (HOSPITAL_COMMUNITY): Payer: Self-pay

## 2024-01-28 ENCOUNTER — Other Ambulatory Visit: Payer: Self-pay

## 2024-01-28 ENCOUNTER — Other Ambulatory Visit (HOSPITAL_COMMUNITY): Payer: Self-pay

## 2024-02-05 ENCOUNTER — Encounter: Payer: Self-pay | Admitting: Internal Medicine

## 2024-02-05 ENCOUNTER — Other Ambulatory Visit (HOSPITAL_COMMUNITY): Payer: Self-pay

## 2024-02-05 ENCOUNTER — Other Ambulatory Visit: Payer: Self-pay

## 2024-02-05 ENCOUNTER — Ambulatory Visit: Admitting: Internal Medicine

## 2024-02-05 VITALS — BP 116/80 | HR 68 | Temp 98.2°F | Ht 63.0 in | Wt 262.0 lb

## 2024-02-05 DIAGNOSIS — R062 Wheezing: Secondary | ICD-10-CM | POA: Diagnosis not present

## 2024-02-05 DIAGNOSIS — R5383 Other fatigue: Secondary | ICD-10-CM | POA: Diagnosis not present

## 2024-02-05 DIAGNOSIS — J4531 Mild persistent asthma with (acute) exacerbation: Secondary | ICD-10-CM

## 2024-02-05 DIAGNOSIS — E538 Deficiency of other specified B group vitamins: Secondary | ICD-10-CM | POA: Diagnosis not present

## 2024-02-05 DIAGNOSIS — J301 Allergic rhinitis due to pollen: Secondary | ICD-10-CM | POA: Diagnosis not present

## 2024-02-05 DIAGNOSIS — E559 Vitamin D deficiency, unspecified: Secondary | ICD-10-CM | POA: Diagnosis not present

## 2024-02-05 DIAGNOSIS — R059 Cough, unspecified: Secondary | ICD-10-CM | POA: Diagnosis not present

## 2024-02-05 DIAGNOSIS — R0602 Shortness of breath: Secondary | ICD-10-CM

## 2024-02-05 DIAGNOSIS — R635 Abnormal weight gain: Secondary | ICD-10-CM

## 2024-02-05 LAB — VITAMIN D 25 HYDROXY (VIT D DEFICIENCY, FRACTURES): VITD: 21.63 ng/mL — ABNORMAL LOW (ref 30.00–100.00)

## 2024-02-05 LAB — CBC WITH DIFFERENTIAL/PLATELET
Basophils Absolute: 0.1 10*3/uL (ref 0.0–0.1)
Basophils Relative: 0.7 % (ref 0.0–3.0)
Eosinophils Absolute: 0.6 10*3/uL (ref 0.0–0.7)
Eosinophils Relative: 7.7 % — ABNORMAL HIGH (ref 0.0–5.0)
HCT: 40.1 % (ref 36.0–46.0)
Hemoglobin: 13.4 g/dL (ref 12.0–15.0)
Lymphocytes Relative: 24.9 % (ref 12.0–46.0)
Lymphs Abs: 1.9 10*3/uL (ref 0.7–4.0)
MCHC: 33.4 g/dL (ref 30.0–36.0)
MCV: 88.8 fl (ref 78.0–100.0)
Monocytes Absolute: 0.5 10*3/uL (ref 0.1–1.0)
Monocytes Relative: 5.9 % (ref 3.0–12.0)
Neutro Abs: 4.7 10*3/uL (ref 1.4–7.7)
Neutrophils Relative %: 60.8 % (ref 43.0–77.0)
Platelets: 278 10*3/uL (ref 150.0–400.0)
RBC: 4.51 Mil/uL (ref 3.87–5.11)
RDW: 13.1 % (ref 11.5–15.5)
WBC: 7.8 10*3/uL (ref 4.0–10.5)

## 2024-02-05 LAB — COMPREHENSIVE METABOLIC PANEL WITH GFR
ALT: 17 U/L (ref 0–35)
AST: 18 U/L (ref 0–37)
Albumin: 4.4 g/dL (ref 3.5–5.2)
Alkaline Phosphatase: 66 U/L (ref 39–117)
BUN: 10 mg/dL (ref 6–23)
CO2: 29 meq/L (ref 19–32)
Calcium: 9.6 mg/dL (ref 8.4–10.5)
Chloride: 100 meq/L (ref 96–112)
Creatinine, Ser: 0.8 mg/dL (ref 0.40–1.20)
GFR: 91.16 mL/min (ref >60.00)
Glucose, Bld: 89 mg/dL (ref 70–99)
Potassium: 4 meq/L (ref 3.5–5.1)
Sodium: 138 meq/L (ref 135–145)
Total Bilirubin: 0.4 mg/dL (ref 0.2–1.2)
Total Protein: 7.8 g/dL (ref 6.0–8.3)

## 2024-02-05 LAB — D-DIMER, QUANTITATIVE: D-Dimer, Quant: 0.25 ug{FEU}/mL (ref <0.50)

## 2024-02-05 LAB — VITAMIN B12: Vitamin B-12: 156 pg/mL — ABNORMAL LOW (ref 211–911)

## 2024-02-05 LAB — TSH: TSH: 1.23 u[IU]/mL (ref 0.35–5.50)

## 2024-02-05 MED ORDER — ALBUTEROL SULFATE HFA 108 (90 BASE) MCG/ACT IN AERS: 2.0000 | INHALATION_SPRAY | Freq: Four times a day (QID) | RESPIRATORY_TRACT | 11 refills | Status: AC | PRN

## 2024-02-05 MED ORDER — ALBUTEROL SULFATE (2.5 MG/3ML) 0.083% IN NEBU
2.5000 mg | INHALATION_SOLUTION | Freq: Four times a day (QID) | RESPIRATORY_TRACT | 1 refills | Status: AC | PRN
  Filled 2024-02-05: qty 150, 13d supply, fill #0

## 2024-02-05 MED ORDER — LEVALBUTEROL TARTRATE 45 MCG/ACT IN AERO: 2.0000 | INHALATION_SPRAY | RESPIRATORY_TRACT | 11 refills | Status: AC | PRN

## 2024-02-05 MED ORDER — METHYLPREDNISOLONE 4 MG PO TBPK
ORAL_TABLET | ORAL | 0 refills | Status: AC
  Filled 2024-02-05: qty 21, 6d supply, fill #0

## 2024-02-05 MED ORDER — METHYLPREDNISOLONE ACETATE 80 MG/ML IJ SUSP
80.0000 mg | Freq: Once | INTRAMUSCULAR | Status: AC
  Administered 2024-02-05: 80 mg via INTRAMUSCULAR

## 2024-02-05 MED ORDER — BUDESONIDE-FORMOTEROL FUMARATE 160-4.5 MCG/ACT IN AERO
2.0000 | INHALATION_SPRAY | Freq: Two times a day (BID) | RESPIRATORY_TRACT | 3 refills | Status: AC
  Filled 2024-02-05: qty 10.2, 30d supply, fill #0
  Filled 2024-04-05: qty 10.2, 30d supply, fill #1
  Filled 2024-07-04: qty 10.2, 30d supply, fill #2

## 2024-02-05 NOTE — Assessment & Plan Note (Signed)
 Likely due to asthma exacerbation.  Rule out other causes.  Obtain D-dimer stat.  Obtain other lab work. Last chest x-ray was normal in 2024.  Go to ER if worse. Albuterol , Airsupra  was not working well - change to ventolin  brand if available Continue with Symbicort  Albuterol  solution for the nebulizer to use 4 times a day Weight loss advised Depo-Medrol  80 mg injection today Medrol  DosePack Xopenex prescription given

## 2024-02-05 NOTE — Progress Notes (Addendum)
 Subjective:  Patient ID: Deanna Schwartz, female    DOB: 10-15-1980  Age: 43 y.o. MRN: [id number]  CC: Medical Management of Chronic Issues (Recheck labs and pt also has swelling in ankles with tingling in her rt toes off and on )   HPI Ceniyah I Mcinerny presents for some palpitations, skipped beats, fatigue x 1 week Pt went to [place] by car x 1 wk, came back on Sun  Generic albuterol  does not seem to work (what inhaler).  Airsupra  did not help.  Outpatient Medications Prior to Visit  Medication Sig Dispense Refill   EPINEPHrine  (EPIPEN  2-PAK) 0.3 mg/0.3 mL IJ SOAJ injection Inject 1 pen into the muscle as needed for anaphylaxis. 2 each 3   fluocinonide  (LIDEX ) 0.05 % external solution Apply sparingly to scalp once daily as needed for itching. 60 mL 3   ketoconazole  (NIZORAL ) 2 % shampoo Apply a small amount topically to wet scalp once a week, leave on for 3-5 minutes then rinse 120 mL 11   levocetirizine (XYZAL ) 5 MG tablet Take 1 tablet (5 mg total) by mouth every evening. 90 tablet 0   montelukast  (SINGULAIR ) 10 MG tablet Take 1 tablet (10 mg total) by mouth at bedtime. 30 tablet 3   Prenatal Vit-Fe Fumarate-FA (PRENATAL VITAMINS PO) Take 1 tablet by mouth daily.     sertraline  (ZOLOFT ) 50 MG tablet Take 1 tablet (50 mg total) by mouth daily. 90 tablet 1   albuterol  (VENTOLIN  HFA) 108 (90 Base) MCG/ACT inhaler Inhale 1-2 puffs into the lungs every 6 (six) hours as needed. 6.7 g 2   Albuterol -Budesonide  (AIRSUPRA ) 90-80 MCG/ACT AERO Inhale 2 Inhalations into the lungs every 4 (four) hours as needed. 30 g 3   predniSONE  (DELTASONE ) 20 MG tablet Take 2 tablets (40 mg total) by mouth daily with breakfast. 14 tablet 0   Vitamin D , Ergocalciferol , (DRISDOL ) 1.25 MG (50000 UNIT) CAPS capsule Take 1 capsule (50,000 Units total) by mouth every 7 (seven) days. 12 capsule 0   budesonide -formoterol  (SYMBICORT ) 160-4.5 MCG/ACT inhaler Inhale 2 puffs into the lungs 2 (two) times daily. 30.6 g 1    No facility-administered medications prior to visit.    ROS: Review of Systems  Constitutional:  Positive for fatigue and unexpected weight change. Negative for activity change, appetite change and chills.  HENT:  Negative for congestion, mouth sores and sinus pressure.   Eyes:  Negative for visual disturbance.  Respiratory:  Positive for cough, shortness of breath and wheezing. Negative for chest tightness.   Gastrointestinal:  Negative for abdominal pain and nausea.  Genitourinary:  Negative for difficulty urinating, frequency and vaginal pain.  Musculoskeletal:  Negative for back pain and gait problem.  Skin:  Negative for pallor and rash.  Neurological:  Positive for weakness. Negative for dizziness, tremors, numbness and headaches.  Psychiatric/Behavioral:  Negative for confusion and sleep disturbance.     Objective:  BP 116/80   Pulse 68   Temp 98.2 F (36.8 C) (Oral)   Ht 5\' 3"  (1.6 m)   Wt 262 lb (118.8 kg)   LMP 09/13/2022 (Exact Date)   SpO2 97%   BMI 46.41 kg/m   BP Readings from Last 3 Encounters:  02/05/24 116/80  12/23/23 112/72  10/15/23 116/75    Wt Readings from Last 3 Encounters:  02/05/24 262 lb (118.8 kg)  12/23/23 264 lb 2 oz (119.8 kg)  10/15/23 259 lb (117.5 kg)    Physical Exam Constitutional:      General:  She is not in acute distress.    Appearance: She is well-developed. She is obese.  HENT:     Head: Normocephalic.     Right Ear: External ear normal.     Left Ear: External ear normal.     Nose: Nose normal.  Eyes:     General:        Right eye: No discharge.        Left eye: No discharge.     Conjunctiva/sclera: Conjunctivae normal.     Pupils: Pupils are equal, round, and reactive to light.  Neck:     Thyroid : No thyromegaly.     Vascular: No JVD.     Trachea: No tracheal deviation.  Cardiovascular:     Rate and Rhythm: Normal rate and regular rhythm.     Heart sounds: Normal heart sounds.  Pulmonary:     Effort: No  respiratory distress.     Breath sounds: No stridor. No wheezing.  Abdominal:     General: Bowel sounds are normal. There is no distension.     Palpations: Abdomen is soft. There is no mass.     Tenderness: There is no abdominal tenderness. There is no guarding or rebound.  Musculoskeletal:        General: No tenderness.     Cervical back: Normal range of motion and neck supple. No rigidity.     Right lower leg: Edema present.     Left lower leg: Edema present.  Lymphadenopathy:     Cervical: No cervical adenopathy.  Skin:    Findings: No erythema or rash.  Neurological:     Cranial Nerves: No cranial nerve deficit.     Motor: No abnormal muscle tone.     Coordination: Coordination normal.     Deep Tendon Reflexes: Reflexes normal.  Psychiatric:        Behavior: Behavior normal.        Thought Content: Thought content normal.        Judgment: Judgment normal.   Deep rhonchi B Legs w/trace edema Calves NT    A total time of 45 minutes was spent preparing to see the patient, reviewing tests, x-rays and other medical records.  Also, obtaining history and performing comprehensive physical exam.  Additionally, counseling the patient regarding the above listed issues -refractory asthma, allergies, fatigue.   Finally, documenting clinical information in the health records, coordination of care, educating the patient.    Lab Results  Component Value Date   WBC 7.8 02/05/2024   HGB 13.4 02/05/2024   HCT 40.1 02/05/2024   PLT 278.0 02/05/2024   GLUCOSE 89 02/05/2024   CHOL 215 (H) 08/18/2023   TRIG 309 (H) 08/18/2023   HDL 50 08/18/2023   LDLDIRECT 152.0 04/23/2023   LDLCALC 112 (H) 08/18/2023   ALT 17 02/05/2024   AST 18 02/05/2024   NA 138 02/05/2024   K 4.0 02/05/2024   CL 100 02/05/2024   CREATININE 0.80 02/05/2024   BUN 10 02/05/2024   CO2 29 02/05/2024   TSH 1.23 02/05/2024   INR 1.0 07/09/2020   HGBA1C 5.4 08/18/2023    MM 3D DIAGNOSTIC MAMMOGRAM BILATERAL  BREAST Result Date: 05/06/2023 CLINICAL DATA:  43 year old female presenting for annual exam and delayed follow-up of probably benign left axillary lymph nodes. 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 She was asked return for left axillary ultrasound in 3 months for probably benign left axillary lymph nodes. EXAM: DIGITAL DIAGNOSTIC BILATERAL  MAMMOGRAM WITH TOMOSYNTHESIS AND CAD; US  AXILLARY LEFT TECHNIQUE: Bilateral digital diagnostic mammography and breast tomosynthesis was performed. The images were evaluated with computer-aided detection. ; Targeted ultrasound examination of the left axilla was performed. COMPARISON:  Previous exam(s). ACR Breast Density Category b: There are scattered areas of fibroglandular density. FINDINGS: Mammogram: Right breast: No suspicious mass, distortion, or microcalcifications are identified to suggest presence of malignancy. Left breast: The biopsied mass in the retroareolar left breast unchanged. There are no new suspicious findings elsewhere in the left breast. No new abnormality visualized in the left axilla. Ultrasound: Targeted ultrasound is performed in the left axilla demonstrating normal lymph nodes. There is no suspicious mass or abnormal lymph node identified. IMPRESSION: 1. No mammographic evidence of malignancy in the bilateral breasts. 2. Normal left axillary ultrasound. RECOMMENDATION: Screening mammogram in one year.(Code:SM-B-01Y) I have discussed the findings and recommendations with the patient. If applicable, a reminder letter will be sent to the patient regarding the next appointment. BI-RADS CATEGORY  2: Benign. Electronically Signed   By: Allena Ito M.D.   On: 05/06/2023 12:43   US  AXILLA LEFT Result Date: 05/06/2023 CLINICAL DATA:  43 year old female presenting for annual exam and delayed follow-up of probably benign left axillary lymph nodes. Patient had a benign left breast biopsy in November 2023  demonstrating fibroadenoma. She was asked return for left axillary ultrasound in 3 months for probably benign left axillary lymph nodes. EXAM: DIGITAL DIAGNOSTIC BILATERAL MAMMOGRAM WITH TOMOSYNTHESIS AND CAD; US  AXILLARY LEFT TECHNIQUE: Bilateral digital diagnostic mammography and breast tomosynthesis was performed. The images were evaluated with computer-aided detection. ; Targeted ultrasound examination of the left axilla was performed. COMPARISON:  Previous exam(s). ACR Breast Density Category b: There are scattered areas of fibroglandular density. FINDINGS: Mammogram: Right breast: No suspicious mass, distortion, or microcalcifications are identified to suggest presence of malignancy. Left breast: The biopsied mass in the retroareolar left breast unchanged. There are no new suspicious findings elsewhere in the left breast. No new abnormality visualized in the left axilla. Ultrasound: Targeted ultrasound is performed in the left axilla demonstrating normal lymph nodes. There is no suspicious mass or abnormal lymph node identified. IMPRESSION: 1. No mammographic evidence of malignancy in the bilateral breasts. 2. Normal left axillary ultrasound. RECOMMENDATION: Screening mammogram in one year.(Code:SM-B-01Y) I have discussed the findings and recommendations with the patient. If applicable, a reminder letter will be sent to the patient regarding the next appointment. BI-RADS CATEGORY  2: Benign. Electronically Signed   By: Allena Ito M.D.   On: 05/06/2023 12:43    Assessment & Plan:   Problem List Items Addressed This Visit     Allergic rhinitis   Doing well on Xyzal       Asthma   Worse- probable asthma exacerbation Albuterol , Airsupra  was not working well - change to ventolin  brand if available Continue with Symbicort  Albuterol  solution for the nebulizer to use 4 times a day Weight loss advised Depo-Medrol  80 mg injection today Medrol  DosePack Xopenex  prescription to try      Relevant  Medications   budesonide -formoterol  (SYMBICORT ) 160-4.5 MCG/ACT inhaler   albuterol  (VENTOLIN  HFA) 108 (90 Base) MCG/ACT inhaler   albuterol  (PROVENTIL ) (2.5 MG/3ML) 0.083% nebulizer solution   methylPREDNISolone  (MEDROL  DOSEPAK) 4 MG TBPK tablet   levalbuterol  (XOPENEX  HFA) 45 MCG/ACT inhaler   Other fatigue   Relevant Orders   Comprehensive metabolic panel with GFR (Completed)   TSH (Completed)   CBC with Differential/Platelet (Completed)   Vitamin B12 (Completed)   VITAMIN  D 25 Hydroxy (Vit-D Deficiency, Fractures) (Completed)   D-dimer, quantitative (Completed)   Weight gain   Discussed.      B12 deficiency   Not taking B12.  Risks associated with treatment noncompliance were discussed. Compliance was encouraged. Check vitamin B12 level      Relevant Orders   Vitamin B12 (Completed)   Vitamin D  deficiency   Continue with vitamin  D weekly.  Check vitamin D  level.      Relevant Medications   Vitamin D , Ergocalciferol , (DRISDOL ) 1.25 MG (50000 UNIT) CAPS capsule   Other Relevant Orders   VITAMIN D  25 Hydroxy (Vit-D Deficiency, Fractures) (Completed)   Wheezing   Relevant Orders   D-dimer, quantitative (Completed)   Shortness of breath - Primary   Likely due to asthma exacerbation.  Rule out other causes.  Obtain D-dimer stat.  Obtain other lab work. Last chest x-ray was normal in 2024.  Go to ER if worse. Albuterol , Airsupra  was not working well - change to ventolin  brand if available Continue with Symbicort  Albuterol  solution for the nebulizer to use 4 times a day Weight loss advised Depo-Medrol  80 mg injection today Medrol  DosePack Xopenex  prescription given      Relevant Orders   Comprehensive metabolic panel with GFR (Completed)   TSH (Completed)   CBC with Differential/Platelet (Completed)   Vitamin B12 (Completed)   VITAMIN D  25 Hydroxy (Vit-D Deficiency, Fractures) (Completed)   D-dimer, quantitative (Completed)   Other Visit Diagnoses       Cough in  adult       Relevant Orders   D-dimer, quantitative (Completed)         Meds ordered this encounter  Medications   budesonide -formoterol  (SYMBICORT ) 160-4.5 MCG/ACT inhaler    Sig: Inhale 2 puffs into the lungs 2 (two) times daily.    Dispense:  10.2 g    Refill:  3   albuterol  (VENTOLIN  HFA) 108 (90 Base) MCG/ACT inhaler    Sig: Inhale 2 puffs into the lungs every 6 (six) hours as needed for wheezing or shortness of breath.    Dispense:  8 g    Refill:  11    Needs Ventolin    albuterol  (PROVENTIL ) (2.5 MG/3ML) 0.083% nebulizer solution    Sig: Take 3 mLs (2.5 mg total) by nebulization every 6 (six) hours as needed for wheezing or shortness of breath.    Dispense:  150 mL    Refill:  1   methylPREDNISolone  (MEDROL  DOSEPAK) 4 MG TBPK tablet    Sig: Taper As directed on package    Dispense:  21 tablet    Refill:  0   levalbuterol  (XOPENEX  HFA) 45 MCG/ACT inhaler    Sig: Inhale 2 puffs into the lungs every 4 (four) hours as needed for wheezing.    Dispense:  1 each    Refill:  11   methylPREDNISolone  acetate (DEPO-MEDROL ) injection 80 mg   Vitamin D , Ergocalciferol , (DRISDOL ) 1.25 MG (50000 UNIT) CAPS capsule    Sig: Take 1 capsule (50,000 Units total) by mouth every 7 (seven) days.    Dispense:  8 capsule    Refill:  0   Cholecalciferol (VITAMIN D3) 50 MCG (2000 UT) capsule    Sig: Take 1 capsule (2,000 Units total) by mouth daily.   Cyanocobalamin  (VITAMIN B-12) 5000 MCG SUBL    Sig: Take 1 tablet sublingually daily    Dispense:  100 tablet    Refill:  3      Follow-up: Return in  about 1 week (around 02/12/2024) for a follow-up visit.  Anitra Barn, MD

## 2024-02-05 NOTE — Assessment & Plan Note (Signed)
 Continue with vitamin  D weekly.  Check vitamin D  level.

## 2024-02-05 NOTE — Assessment & Plan Note (Signed)
 Doing well on Xyzal 

## 2024-02-05 NOTE — Assessment & Plan Note (Addendum)
 Not taking B12.  Risks associated with treatment noncompliance were discussed. Compliance was encouraged. Check vitamin B12 level

## 2024-02-05 NOTE — Assessment & Plan Note (Addendum)
 Worse- probable asthma exacerbation Albuterol , Airsupra  was not working well - change to ventolin  brand if available Continue with Symbicort  Albuterol  solution for the nebulizer to use 4 times a day Weight loss advised Depo-Medrol  80 mg injection today Medrol  DosePack Xopenex prescription to try

## 2024-02-05 NOTE — Assessment & Plan Note (Signed)
 Discussed.

## 2024-02-08 ENCOUNTER — Other Ambulatory Visit (HOSPITAL_COMMUNITY): Payer: Self-pay

## 2024-02-08 ENCOUNTER — Other Ambulatory Visit: Payer: Self-pay

## 2024-02-08 MED ORDER — VITAMIN B-12 5000 MCG SL SUBL
SUBLINGUAL_TABLET | SUBLINGUAL | 3 refills | Status: AC
  Filled 2024-02-08: qty 120, 120d supply, fill #0
  Filled 2024-04-05 – 2024-07-04 (×2): qty 120, 120d supply, fill #1

## 2024-02-08 MED ORDER — VITAMIN D (ERGOCALCIFEROL) 1.25 MG (50000 UNIT) PO CAPS
50000.0000 [IU] | ORAL_CAPSULE | ORAL | 0 refills | Status: AC
  Filled 2024-02-08: qty 8, 56d supply, fill #0

## 2024-02-08 MED ORDER — VITAMIN D3 50 MCG (2000 UT) PO CAPS: 2000.0000 [IU] | ORAL_CAPSULE | Freq: Every day | ORAL | Status: AC

## 2024-02-08 NOTE — Addendum Note (Signed)
 Addended by: Keiva Dina V on: 02/08/2024 07:31 AM   Modules accepted: Orders

## 2024-02-09 ENCOUNTER — Encounter: Payer: Self-pay | Admitting: Oncology

## 2024-02-09 ENCOUNTER — Other Ambulatory Visit (HOSPITAL_COMMUNITY): Payer: Self-pay

## 2024-02-10 ENCOUNTER — Other Ambulatory Visit (HOSPITAL_COMMUNITY): Payer: Self-pay

## 2024-02-10 ENCOUNTER — Other Ambulatory Visit: Payer: Self-pay | Admitting: Obstetrics and Gynecology

## 2024-02-10 DIAGNOSIS — Z1231 Encounter for screening mammogram for malignant neoplasm of breast: Secondary | ICD-10-CM

## 2024-04-05 ENCOUNTER — Other Ambulatory Visit (HOSPITAL_COMMUNITY): Payer: Self-pay

## 2024-04-05 ENCOUNTER — Other Ambulatory Visit: Payer: Self-pay | Admitting: Internal Medicine

## 2024-04-05 ENCOUNTER — Other Ambulatory Visit: Payer: Self-pay

## 2024-04-06 ENCOUNTER — Other Ambulatory Visit: Payer: Self-pay

## 2024-04-06 MED ORDER — LEVOCETIRIZINE DIHYDROCHLORIDE 5 MG PO TABS
5.0000 mg | ORAL_TABLET | Freq: Every evening | ORAL | 0 refills | Status: DC
  Filled 2024-04-06: qty 90, 90d supply, fill #0

## 2024-04-25 ENCOUNTER — Encounter: Admitting: Internal Medicine

## 2024-05-03 ENCOUNTER — Ambulatory Visit: Admitting: Internal Medicine

## 2024-05-03 ENCOUNTER — Encounter: Payer: Self-pay | Admitting: Internal Medicine

## 2024-05-03 ENCOUNTER — Other Ambulatory Visit (HOSPITAL_COMMUNITY): Payer: Self-pay

## 2024-05-03 VITALS — BP 129/72 | HR 85 | Temp 98.7°F | Ht 63.0 in | Wt 266.0 lb

## 2024-05-03 DIAGNOSIS — E538 Deficiency of other specified B group vitamins: Secondary | ICD-10-CM

## 2024-05-03 DIAGNOSIS — D508 Other iron deficiency anemias: Secondary | ICD-10-CM | POA: Diagnosis not present

## 2024-05-03 DIAGNOSIS — Z0000 Encounter for general adult medical examination without abnormal findings: Secondary | ICD-10-CM

## 2024-05-03 DIAGNOSIS — E559 Vitamin D deficiency, unspecified: Secondary | ICD-10-CM | POA: Diagnosis not present

## 2024-05-03 LAB — COMPREHENSIVE METABOLIC PANEL WITH GFR
ALT: 14 U/L (ref 0–35)
AST: 15 U/L (ref 0–37)
Albumin: 4 g/dL (ref 3.5–5.2)
Alkaline Phosphatase: 56 U/L (ref 39–117)
BUN: 10 mg/dL (ref 6–23)
CO2: 27 meq/L (ref 19–32)
Calcium: 9 mg/dL (ref 8.4–10.5)
Chloride: 101 meq/L (ref 96–112)
Creatinine, Ser: 0.77 mg/dL (ref 0.40–1.20)
GFR: 95.28 mL/min (ref >60.00)
Glucose, Bld: 91 mg/dL (ref 70–99)
Potassium: 4 meq/L (ref 3.5–5.1)
Sodium: 139 meq/L (ref 135–145)
Total Bilirubin: 0.4 mg/dL (ref 0.2–1.2)
Total Protein: 7.4 g/dL (ref 6.0–8.3)

## 2024-05-03 LAB — URINALYSIS
Bilirubin Urine: NEGATIVE
Hgb urine dipstick: NEGATIVE
Ketones, ur: NEGATIVE
Leukocytes,Ua: NEGATIVE
Nitrite: NEGATIVE
Specific Gravity, Urine: 1.015 (ref 1.000–1.030)
Total Protein, Urine: NEGATIVE
Urine Glucose: NEGATIVE
Urobilinogen, UA: 0.2 (ref 0.0–1.0)
pH: 7.5 (ref 5.0–8.0)

## 2024-05-03 LAB — CBC WITH DIFFERENTIAL/PLATELET
Basophils Absolute: 0 K/uL (ref 0.0–0.1)
Basophils Relative: 0.4 % (ref 0.0–3.0)
Eosinophils Absolute: 0.5 K/uL (ref 0.0–0.7)
Eosinophils Relative: 7 % — ABNORMAL HIGH (ref 0.0–5.0)
HCT: 37.2 % (ref 36.0–46.0)
Hemoglobin: 12.5 g/dL (ref 12.0–15.0)
Lymphocytes Relative: 24 % (ref 12.0–46.0)
Lymphs Abs: 1.7 K/uL (ref 0.7–4.0)
MCHC: 33.6 g/dL (ref 30.0–36.0)
MCV: 88.7 fl (ref 78.0–100.0)
Monocytes Absolute: 0.4 K/uL (ref 0.1–1.0)
Monocytes Relative: 6 % (ref 3.0–12.0)
Neutro Abs: 4.5 K/uL (ref 1.4–7.7)
Neutrophils Relative %: 62.6 % (ref 43.0–77.0)
Platelets: 227 K/uL (ref 150.0–400.0)
RBC: 4.2 Mil/uL (ref 3.87–5.11)
RDW: 13.1 % (ref 11.5–15.5)
WBC: 7.2 K/uL (ref 4.0–10.5)

## 2024-05-03 LAB — VITAMIN B12: Vitamin B-12: 220 pg/mL (ref 211–911)

## 2024-05-03 LAB — TSH: TSH: 1.4 u[IU]/mL (ref 0.35–5.50)

## 2024-05-03 LAB — VITAMIN D 25 HYDROXY (VIT D DEFICIENCY, FRACTURES): VITD: 26 ng/mL — ABNORMAL LOW (ref 30.00–100.00)

## 2024-05-03 MED ORDER — LEVOCETIRIZINE DIHYDROCHLORIDE 5 MG PO TABS
5.0000 mg | ORAL_TABLET | Freq: Every evening | ORAL | 3 refills | Status: AC
  Filled 2024-05-03 – 2024-07-04 (×2): qty 90, 90d supply, fill #0

## 2024-05-03 NOTE — Assessment & Plan Note (Signed)
 On B12 5000 mcg sl  Given 1000 mcg inj today

## 2024-05-03 NOTE — Progress Notes (Signed)
 Subjective:  Patient ID: Deanna Schwartz, female    DOB: 12/10/1980  Age: 43 y.o. MRN: [id number]  CC: Annual Exam   HPI Deanna Schwartz presents for a well exam  Outpatient Medications Prior to Visit  Medication Sig Dispense Refill   albuterol  (PROVENTIL ) (2.5 MG/3ML) 0.083% nebulizer solution Take 3 mLs (2.5 mg total) by nebulization every 6 (six) hours as needed for wheezing or shortness of breath. 150 mL 1   albuterol  (VENTOLIN  HFA) 108 (90 Base) MCG/ACT inhaler Inhale 2 puffs into the lungs every 6 (six) hours as needed for wheezing or shortness of breath. 8 g 11   budesonide -formoterol  (SYMBICORT ) 160-4.5 MCG/ACT inhaler Inhale 2 puffs into the lungs 2 (two) times daily. 10.2 g 3   Cholecalciferol (VITAMIN D3) 50 MCG (2000 UT) capsule Take 1 capsule (2,000 Units total) by mouth daily.     Cyanocobalamin  (VITAMIN B-12) 5000 MCG SUBL Take 1 tablet sublingually daily 100 tablet 3   EPINEPHrine  (EPIPEN  2-PAK) 0.3 mg/0.3 mL IJ SOAJ injection Inject 1 pen into the muscle as needed for anaphylaxis. 2 each 3   fluocinonide  (LIDEX ) 0.05 % external solution Apply sparingly to scalp once daily as needed for itching. 60 mL 3   ketoconazole  (NIZORAL ) 2 % shampoo Apply a small amount topically to wet scalp once a week, leave on for 3-5 minutes then rinse 120 mL 11   levalbuterol  (XOPENEX  HFA) 45 MCG/ACT inhaler Inhale 2 puffs into the lungs every 4 (four) hours as needed for wheezing. 1 each 11   methylPREDNISolone  (MEDROL  DOSEPAK) 4 MG TBPK tablet Taper As directed on package 21 tablet 0   montelukast  (SINGULAIR ) 10 MG tablet Take 1 tablet (10 mg total) by mouth at bedtime. 30 tablet 3   Prenatal Vit-Fe Fumarate-FA (PRENATAL VITAMINS PO) Take 1 tablet by mouth daily.     sertraline  (ZOLOFT ) 50 MG tablet Take 1 tablet (50 mg total) by mouth daily. 90 tablet 1   Vitamin D , Ergocalciferol , (DRISDOL ) 1.25 MG (50000 UNIT) CAPS capsule Take 1 capsule (50,000 Units total) by mouth every 7 (seven)  days. 8 capsule 0   levocetirizine (XYZAL ) 5 MG tablet Take 1 tablet (5 mg total) by mouth every evening. 90 tablet 0   No facility-administered medications prior to visit.    ROS: Review of Systems  Constitutional:  Negative for activity change, appetite change, chills, fatigue and unexpected weight change.  HENT:  Negative for congestion, mouth sores and sinus pressure.   Eyes:  Negative for visual disturbance.  Respiratory:  Negative for cough and chest tightness.   Gastrointestinal:  Negative for abdominal pain and nausea.  Genitourinary:  Negative for difficulty urinating, frequency and vaginal pain.  Musculoskeletal:  Negative for back pain and gait problem.  Skin:  Negative for pallor and rash.  Neurological:  Negative for dizziness, tremors, weakness, numbness and headaches.  Psychiatric/Behavioral:  Negative for confusion and sleep disturbance.     Objective:  BP 129/72   Pulse 85   Temp 98.7 F (37.1 C) (Oral)   Ht 5' 3 (1.6 m)   Wt 266 lb (120.7 kg)   LMP 09/13/2022 (Exact Date)   SpO2 100%   BMI 47.12 kg/m   BP Readings from Last 3 Encounters:  05/03/24 129/72  02/05/24 116/80  12/23/23 112/72    Wt Readings from Last 3 Encounters:  05/03/24 266 lb (120.7 kg)  02/05/24 262 lb (118.8 kg)  12/23/23 264 lb 2 oz (119.8 kg)    Physical  Exam Constitutional:      General: She is not in acute distress.    Appearance: She is well-developed.  HENT:     Head: Normocephalic.     Right Ear: External ear normal.     Left Ear: External ear normal.     Nose: Nose normal.  Eyes:     General:        Right eye: No discharge.        Left eye: No discharge.     Conjunctiva/sclera: Conjunctivae normal.     Pupils: Pupils are equal, round, and reactive to light.  Neck:     Thyroid : No thyromegaly.     Vascular: No JVD.     Trachea: No tracheal deviation.  Cardiovascular:     Rate and Rhythm: Normal rate and regular rhythm.     Heart sounds: Normal heart sounds.   Pulmonary:     Effort: No respiratory distress.     Breath sounds: No stridor. No wheezing.  Abdominal:     General: Bowel sounds are normal. There is no distension.     Palpations: Abdomen is soft. There is no mass.     Tenderness: There is no abdominal tenderness. There is no guarding or rebound.  Musculoskeletal:        General: No tenderness.     Cervical back: Normal range of motion and neck supple. No rigidity.  Lymphadenopathy:     Cervical: No cervical adenopathy.  Skin:    Findings: No erythema or rash.  Neurological:     Mental Status: She is oriented to person, place, and time.     Cranial Nerves: No cranial nerve deficit.     Motor: No abnormal muscle tone.     Coordination: Coordination normal.     Deep Tendon Reflexes: Reflexes normal.  Psychiatric:        Behavior: Behavior normal.        Thought Content: Thought content normal.        Judgment: Judgment normal.     Lab Results  Component Value Date   WBC 7.8 02/05/2024   HGB 13.4 02/05/2024   HCT 40.1 02/05/2024   PLT 278.0 02/05/2024   GLUCOSE 89 02/05/2024   CHOL 215 (H) 08/18/2023   TRIG 309 (H) 08/18/2023   HDL 50 08/18/2023   LDLDIRECT 152.0 04/23/2023   LDLCALC 112 (H) 08/18/2023   ALT 17 02/05/2024   AST 18 02/05/2024   NA 138 02/05/2024   K 4.0 02/05/2024   CL 100 02/05/2024   CREATININE 0.80 02/05/2024   BUN 10 02/05/2024   CO2 29 02/05/2024   TSH 1.23 02/05/2024   INR 1.0 07/09/2020   HGBA1C 5.4 08/18/2023    MM 3D DIAGNOSTIC MAMMOGRAM BILATERAL BREAST Result Date: 05/06/2023 CLINICAL DATA:  43 year old female presenting for annual exam and delayed follow-up of probably benign left axillary lymph nodes. 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 She was asked return for left axillary ultrasound in 3 months for probably benign left axillary lymph nodes. EXAM: DIGITAL DIAGNOSTIC BILATERAL MAMMOGRAM WITH TOMOSYNTHESIS AND CAD; US  AXILLARY LEFT  TECHNIQUE: Bilateral digital diagnostic mammography and breast tomosynthesis was performed. The images were evaluated with computer-aided detection. ; Targeted ultrasound examination of the left axilla was performed. COMPARISON:  Previous exam(s). ACR Breast Density Category b: There are scattered areas of fibroglandular density. FINDINGS: Mammogram: Right breast: No suspicious mass, distortion, or microcalcifications are identified to suggest presence of malignancy. Left breast:  The biopsied mass in the retroareolar left breast unchanged. There are no new suspicious findings elsewhere in the left breast. No new abnormality visualized in the left axilla. Ultrasound: Targeted ultrasound is performed in the left axilla demonstrating normal lymph nodes. There is no suspicious mass or abnormal lymph node identified. IMPRESSION: 1. No mammographic evidence of malignancy in the bilateral breasts. 2. Normal left axillary ultrasound. RECOMMENDATION: Screening mammogram in one year.(Code:SM-B-01Y) I have discussed the findings and recommendations with the patient. If applicable, a reminder letter will be sent to the patient regarding the next appointment. BI-RADS CATEGORY  2: Benign. Electronically Signed   By: Inocente Ast M.D.   On: 05/06/2023 12:43   US  AXILLA LEFT Result Date: 05/06/2023 CLINICAL DATA:  43 year old female presenting for annual exam and delayed follow-up of probably benign left axillary lymph nodes. 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 She was asked return for left axillary ultrasound in 3 months for probably benign left axillary lymph nodes. EXAM: DIGITAL DIAGNOSTIC BILATERAL MAMMOGRAM WITH TOMOSYNTHESIS AND CAD; US  AXILLARY LEFT TECHNIQUE: Bilateral digital diagnostic mammography and breast tomosynthesis was performed. The images were evaluated with computer-aided detection. ; Targeted ultrasound examination of the left axilla was performed.  COMPARISON:  Previous exam(s). ACR Breast Density Category b: There are scattered areas of fibroglandular density. FINDINGS: Mammogram: Right breast: No suspicious mass, distortion, or microcalcifications are identified to suggest presence of malignancy. Left breast: The biopsied mass in the retroareolar left breast unchanged. There are no new suspicious findings elsewhere in the left breast. No new abnormality visualized in the left axilla. Ultrasound: Targeted ultrasound is performed in the left axilla demonstrating normal lymph nodes. There is no suspicious mass or abnormal lymph node identified. IMPRESSION: 1. No mammographic evidence of malignancy in the bilateral breasts. 2. Normal left axillary ultrasound. RECOMMENDATION: Screening mammogram in one year.(Code:SM-B-01Y) I have discussed the findings and recommendations with the patient. If applicable, a reminder letter will be sent to the patient regarding the next appointment. BI-RADS CATEGORY  2: Benign. Electronically Signed   By: Inocente Ast M.D.   On: 05/06/2023 12:43    Assessment & Plan:   Problem List Items Addressed This Visit     Anemia, iron  deficiency   Doing well      B12 deficiency   On B12 5000 mcg sl  Given 1000 mcg inj today      Relevant Orders   Vitamin B12   Vitamin D  deficiency   Relevant Orders   VITAMIN D  25 Hydroxy (Vit-D Deficiency, Fractures)   Well adult exam - Primary   We discussed age appropriate health related issues, including available/recomended screening tests and vaccinations. We discussed a need for adhering to healthy diet and exercise. Labs were ordered to be later reviewed . All questions were answered. Partial hysterectomy Jan 2024 Refused Pneumovax      Relevant Orders   Comprehensive metabolic panel with GFR   CBC with Differential/Platelet   TSH   Vitamin B12   VITAMIN D  25 Hydroxy (Vit-D Deficiency, Fractures)   Urinalysis      Meds ordered this encounter  Medications    levocetirizine (XYZAL ) 5 MG tablet    Sig: Take 1 tablet (5 mg total) by mouth every evening.    Dispense:  90 tablet    Refill:  3      Follow-up: Return in about 6 months (around 11/03/2024) for a follow-up visit.  Marolyn Noel, MD

## 2024-05-03 NOTE — Assessment & Plan Note (Signed)
 We discussed age appropriate health related issues, including available/recomended screening tests and vaccinations. We discussed a need for adhering to healthy diet and exercise. Labs were ordered to be later reviewed . All questions were answered. Partial hysterectomy Jan 2024 Refused Pneumovax

## 2024-05-03 NOTE — Assessment & Plan Note (Signed)
 Doing well

## 2024-05-12 ENCOUNTER — Ambulatory Visit

## 2024-05-13 ENCOUNTER — Ambulatory Visit: Payer: Self-pay | Admitting: Internal Medicine

## 2024-05-17 ENCOUNTER — Ambulatory Visit

## 2024-05-19 ENCOUNTER — Ambulatory Visit

## 2024-05-24 ENCOUNTER — Other Ambulatory Visit

## 2024-05-27 ENCOUNTER — Ambulatory Visit

## 2024-06-09 ENCOUNTER — Ambulatory Visit
Admission: RE | Admit: 2024-06-09 | Discharge: 2024-06-09 | Disposition: A | Discharge status: Home / Self Care | Source: Ambulatory Visit | Attending: Obstetrics and Gynecology | Admitting: Obstetrics and Gynecology

## 2024-06-09 ENCOUNTER — Other Ambulatory Visit

## 2024-06-09 DIAGNOSIS — Z0000 Encounter for general adult medical examination without abnormal findings: Secondary | ICD-10-CM | POA: Diagnosis not present

## 2024-06-09 DIAGNOSIS — Z1231 Encounter for screening mammogram for malignant neoplasm of breast: Secondary | ICD-10-CM

## 2024-06-09 LAB — LIPID PANEL
Cholesterol: 201 mg/dL — ABNORMAL HIGH (ref 0–200)
HDL: 49.7 mg/dL (ref >39.00)
LDL Cholesterol: 116 mg/dL — ABNORMAL HIGH (ref 0–99)
NonHDL: 151.01
Total CHOL/HDL Ratio: 4
Triglycerides: 176 mg/dL — ABNORMAL HIGH (ref 0.0–149.0)
VLDL: 35.2 mg/dL (ref 0.0–40.0)

## 2024-06-11 ENCOUNTER — Ambulatory Visit: Payer: Self-pay | Admitting: Internal Medicine

## 2024-06-14 DIAGNOSIS — N951 Menopausal and female climacteric states: Secondary | ICD-10-CM | POA: Diagnosis not present

## 2024-06-14 DIAGNOSIS — E559 Vitamin D deficiency, unspecified: Secondary | ICD-10-CM | POA: Diagnosis not present

## 2024-06-14 DIAGNOSIS — R3129 Other microscopic hematuria: Secondary | ICD-10-CM | POA: Diagnosis not present

## 2024-06-14 DIAGNOSIS — Z13220 Encounter for screening for lipoid disorders: Secondary | ICD-10-CM | POA: Diagnosis not present

## 2024-06-14 DIAGNOSIS — Z01411 Encounter for gynecological examination (general) (routine) with abnormal findings: Secondary | ICD-10-CM | POA: Diagnosis not present

## 2024-06-14 DIAGNOSIS — Z130 Encounter for screening for diseases of the blood and blood-forming organs and certain disorders involving the immune mechanism: Secondary | ICD-10-CM | POA: Diagnosis not present

## 2024-06-14 DIAGNOSIS — Z01419 Encounter for gynecological examination (general) (routine) without abnormal findings: Secondary | ICD-10-CM | POA: Diagnosis not present

## 2024-06-14 DIAGNOSIS — Z13228 Encounter for screening for other metabolic disorders: Secondary | ICD-10-CM | POA: Diagnosis not present

## 2024-06-14 DIAGNOSIS — R6889 Other general symptoms and signs: Secondary | ICD-10-CM | POA: Diagnosis not present

## 2024-06-14 DIAGNOSIS — Z1389 Encounter for screening for other disorder: Secondary | ICD-10-CM | POA: Diagnosis not present

## 2024-06-15 ENCOUNTER — Encounter: Payer: Self-pay | Admitting: Internal Medicine

## 2024-06-17 ENCOUNTER — Other Ambulatory Visit: Payer: Self-pay | Admitting: *Deleted

## 2024-06-17 DIAGNOSIS — Z006 Encounter for examination for normal comparison and control in clinical research program: Secondary | ICD-10-CM

## 2024-06-27 DIAGNOSIS — F5104 Psychophysiologic insomnia: Secondary | ICD-10-CM | POA: Diagnosis not present

## 2024-06-27 DIAGNOSIS — R5382 Chronic fatigue, unspecified: Secondary | ICD-10-CM | POA: Diagnosis not present

## 2024-07-02 ENCOUNTER — Ambulatory Visit
Admission: EM | Admit: 2024-07-02 | Discharge: 2024-07-02 | Disposition: A | Discharge status: Home / Self Care | Attending: Family Medicine | Admitting: Family Medicine

## 2024-07-02 DIAGNOSIS — J4541 Moderate persistent asthma with (acute) exacerbation: Secondary | ICD-10-CM

## 2024-07-02 DIAGNOSIS — J988 Other specified respiratory disorders: Secondary | ICD-10-CM | POA: Diagnosis not present

## 2024-07-02 DIAGNOSIS — J309 Allergic rhinitis, unspecified: Secondary | ICD-10-CM | POA: Diagnosis not present

## 2024-07-02 DIAGNOSIS — B9789 Other viral agents as the cause of diseases classified elsewhere: Secondary | ICD-10-CM | POA: Diagnosis not present

## 2024-07-02 LAB — POC COVID19/FLU A&B COMBO
Covid Antigen, POC: NEGATIVE
Influenza A Antigen, POC: NEGATIVE
Influenza B Antigen, POC: NEGATIVE

## 2024-07-02 MED ORDER — PROMETHAZINE-DM 6.25-15 MG/5ML PO SYRP: 5.0000 mL | ORAL_SOLUTION | Freq: Three times a day (TID) | ORAL | 0 refills | Status: AC | PRN

## 2024-07-02 MED ORDER — PREDNISONE 20 MG PO TABS: ORAL_TABLET | ORAL | 0 refills | Status: AC

## 2024-07-02 NOTE — Discharge Instructions (Addendum)
 We will manage this as a viral respiratory infection made worse by your asthma, allergies. For sore throat or cough try using a honey-based tea. Use 3 teaspoons of honey with juice squeezed from half lemon. Place shaved pieces of ginger into 1/2-1 cup of water and warm over stove top. Then mix the ingredients and repeat every 4 hours as needed. Please take Tylenol  500mg -650mg  once every 6 hours for fevers, aches and pains. Hydrate very well with at least 2 liters (64 ounces) of water. Eat light meals such as soups (chicken and noodles, chicken wild rice, vegetable).  Do not eat any foods that you are allergic to.  Start an antihistamine like Zyrtec  (10mg  daily) for postnasal drainage, sinus congestion.  You can take this together with prednisone  and albuterol . Continue your Symbicort  as prescribed. Use cough syrup as needed.

## 2024-07-02 NOTE — ED Provider Notes (Signed)
 [facility]  Note:  This document was prepared using Conservation officer, historic buildings and may include unintentional dictation errors.  MRN: [id number] DOB: [birth year]  Subjective:   Deanna Schwartz is a 43 y.o. female presenting for 1 day history of congestion in the sinus, chest as well as wheezing, shortness of breath, sneezing, body aches. Has allergic rhinitis and asthma. Takes Xyzal  and last used her albuterol  before coming to the clinic. No smoking of any kind including cigarettes, cigars, vaping, marijuana use.    No current facility-administered medications for this encounter.  Current Outpatient Medications:    albuterol  (PROVENTIL ) (2.5 MG/3ML) 0.083% nebulizer solution, Take 3 mLs (2.5 mg total) by nebulization every 6 (six) hours as needed for wheezing or shortness of breath., Disp: 150 mL, Rfl: 1   albuterol  (VENTOLIN  HFA) 108 (90 Base) MCG/ACT inhaler, Inhale 2 puffs into the lungs every 6 (six) hours as needed for wheezing or shortness of breath., Disp: 8 g, Rfl: 11   budesonide -formoterol  (SYMBICORT ) 160-4.5 MCG/ACT inhaler, Inhale 2 puffs into the lungs 2 (two) times daily., Disp: 10.2 g, Rfl: 3   Cholecalciferol (VITAMIN D3) 50 MCG (2000 UT) capsule, Take 1 capsule (2,000 Units total) by mouth daily., Disp: , Rfl:    Cyanocobalamin  (VITAMIN B-12) 5000 MCG SUBL, Take 1 tablet sublingually daily, Disp: 100 tablet, Rfl: 3   EPINEPHrine  (EPIPEN  2-PAK) 0.3 mg/0.3 mL IJ SOAJ injection, Inject 1 pen into the muscle as needed for anaphylaxis., Disp: 2 each, Rfl: 3   fluocinonide  (LIDEX ) 0.05 % external solution, Apply sparingly to scalp once daily as needed for itching., Disp: 60 mL, Rfl: 3   ketoconazole  (NIZORAL ) 2 % shampoo, Apply a small amount topically to wet scalp once a week, leave on for 3-5 minutes then rinse, Disp: 120 mL, Rfl: 11   levalbuterol  (XOPENEX  HFA) 45 MCG/ACT inhaler, Inhale 2 puffs into the lungs every 4 (four) hours as needed for  wheezing., Disp: 1 each, Rfl: 11   levocetirizine (XYZAL ) 5 MG tablet, Take 1 tablet (5 mg total) by mouth every evening., Disp: 90 tablet, Rfl: 3   methylPREDNISolone  (MEDROL  DOSEPAK) 4 MG TBPK tablet, Taper As directed on package, Disp: 21 tablet, Rfl: 0   montelukast  (SINGULAIR ) 10 MG tablet, Take 1 tablet (10 mg total) by mouth at bedtime., Disp: 30 tablet, Rfl: 3   Prenatal Vit-Fe Fumarate-FA (PRENATAL VITAMINS PO), Take 1 tablet by mouth daily., Disp: , Rfl:    sertraline  (ZOLOFT ) 50 MG tablet, Take 1 tablet (50 mg total) by mouth daily., Disp: 90 tablet, Rfl: 1   Vitamin D , Ergocalciferol , (DRISDOL ) 1.25 MG (50000 UNIT) CAPS capsule, Take 1 capsule (50,000 Units total) by mouth every 7 (seven) days., Disp: 8 capsule, Rfl: 0   Allergies  Allergen Reactions   Augmentin  [Amoxicillin -Pot Clavulanate] Rash   Cephalexin Rash   Iodine  Swelling and Rash    Throat swelling after eating shrimp. Patient states that she is no longer allergic to iodine  or shrimp as of 09/16/22.    Past Medical History:  Diagnosis Date   Allergic rhinitis    Anemia    Anxiety    improved per pt on 09/14/22   Asthma    Follows w/ [facility], Dr. Cheryn. Patient states last asthma exacerbation was in early December 2023. States she is breathing well as of 09/16/22.   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 pylori infection 2008   Hx of tx + serology   High cholesterol    IBS (irritable bowel syndrome)    Iron  deficiency anemia 08/06/2011   Hx of multiple iron  infusions, most recent in 08/2022, per pt she is scheduled for an infusion on 09/19/22   Irritable bowel syndrome    Knee pain    Obesity    Follows with [facility].   Other  hyperlipidemia 08/20/2023   Palpitations 04/2021   Echo on 06/04/21 EF 55 -60%   Panic attack    Prediabetes    many years ago per pt, labs improved as of 2024   Shortness of breath    w/ asthma exacerbations   Ulcer    Vitamin D  deficiency    resolved per pt 2024     Past Surgical History:  Procedure Laterality Date   ABDOMINAL HYSTERECTOMY     BREAST BIOPSY Left 07/2022   CYSTOSCOPY N/A 09/22/2022   Procedure: CYSTOSCOPY;  Surgeon: Danielle Rom, MD;  Location: [facility];  Service: Gynecology;  Laterality: N/A;   FLEXIBLE SIGMOIDOSCOPY  04/14/2008   normal   NASAL SINUS SURGERY  2012   ROBOTIC ASSISTED LAPAROSCOPIC HYSTERECTOMY AND SALPINGECTOMY Bilateral 09/22/2022   Procedure: XI ROBOTIC ASSISTED LAPAROSCOPIC HYSTERECTOMY AND SALPINGECTOMY;  Surgeon: Danielle Rom, MD;  [facility];  Service: Gynecology;  Laterality: Bilateral;   UPPER GASTROINTESTINAL ENDOSCOPY     around 2014    Family History  Problem Relation Age of Onset   Hyperlipidemia Mother    Hypertension Mother    Anxiety disorder Mother    Obesity Mother    Cancer Father    Hyperlipidemia Father    Hypertension Father    Diabetes Paternal Grandmother    Diabetes Maternal Aunt    Heart attack Maternal Aunt        Strokes (died of a heart attack)   Diabetes Maternal Aunt    Colon polyps Paternal Uncle        x4   Diabetes Other        grandmother   Hypertension Other    Asthma Maternal Aunt    Colon cancer Neg Hx     Social History   Tobacco Use   Smoking status: Never   Smokeless tobacco: Never  Vaping Use   Vaping status: Never Used  Substance Use Topics   Alcohol use: Not Currently    Comment: occ   Drug use: No    ROS   Objective:   Vitals: BP 124/76 (BP Location: Right Arm)   Pulse (!) 107   Temp 98.7 F (37.1 C) (Oral)   Resp 20   LMP 09/13/2022 (Exact Date)   SpO2 97%   Physical Exam Constitutional:       General: She is not in acute distress.    Appearance: Normal appearance. She is well-developed and normal weight. She is not ill-appearing, toxic-appearing or diaphoretic.  HENT:     Head: Normocephalic and atraumatic.     Right Ear: Tympanic membrane, ear canal and external ear normal. No drainage or tenderness. No middle ear effusion. There is no impacted cerumen. Tympanic membrane is not erythematous or bulging.     Left  Ear: Tympanic membrane, ear canal and external ear normal. No drainage or tenderness.  No middle ear effusion. There is no impacted cerumen. Tympanic membrane is not erythematous or bulging.     Nose: Congestion and rhinorrhea present.     Mouth/Throat:     Mouth: Mucous membranes are moist. No oral lesions.     Pharynx: No pharyngeal swelling, oropharyngeal exudate, posterior oropharyngeal erythema or uvula swelling.     Tonsils: No tonsillar exudate or tonsillar abscesses.  Eyes:     General: No scleral icterus.       Right eye: No discharge.        Left eye: No discharge.     Extraocular Movements: Extraocular movements intact.     Right eye: Normal extraocular motion.     Left eye: Normal extraocular motion.     Conjunctiva/sclera: Conjunctivae normal.  Cardiovascular:     Rate and Rhythm: Normal rate and regular rhythm.     Heart sounds: Normal heart sounds. No murmur heard.    No friction rub. No gallop.  Pulmonary:     Effort: Pulmonary effort is normal. No respiratory distress.     Breath sounds: No stridor. Wheezing (trace) present. No rhonchi or rales.  Chest:     Chest wall: No tenderness.  Musculoskeletal:     Cervical back: Normal range of motion and neck supple.  Lymphadenopathy:     Cervical: No cervical adenopathy.  Skin:    General: Skin is warm and dry.  Neurological:     General: No focal deficit present.     Mental Status: She is alert and oriented to person, place, and time.  Psychiatric:        Mood and Affect: Mood normal.         Behavior: Behavior normal.     Results for orders placed or performed during the hospital encounter of 07/02/24 (from the past 24 hours)  POC Covid19/Flu A&B Antigen     Status: None   Collection Time: 07/02/24  2:50 PM  Result Value Ref Range   Influenza A Antigen, POC Negative Negative   Influenza B Antigen, POC Negative Negative   Covid Antigen, POC Negative Negative   *Note: Due to a large number of results and/or encounters for the requested time period, some results have not been displayed. A complete set of results can be found in Results Review.    Assessment and Plan :   PDMP not reviewed this encounter.  1. Viral respiratory infection   2. Allergic rhinitis, unspecified seasonality, unspecified trigger   3. Moderate persistent asthma with (acute) exacerbation    Recommended an oral prednisone  course for her significant respiratory symptoms with underlying asthma, allergies.  Otherwise, recommend supportive care for viral respiratory infection.  Deferred imaging given of adventitious lung sounds.  Counseled patient on potential for adverse effects with medications prescribed/recommended today, ER and return-to-clinic precautions discussed, patient verbalized understanding.    Christopher Savannah, [place] 07/02/24 [phone]

## 2024-07-02 NOTE — ED Triage Notes (Signed)
 Pt c/o body aches, wheezing, sinus pain/congestion started yesterday-denies known fever-used inhaler and xyzal -NAD-steady gait

## 2024-07-04 ENCOUNTER — Other Ambulatory Visit (HOSPITAL_COMMUNITY): Payer: Self-pay

## 2024-07-04 ENCOUNTER — Other Ambulatory Visit: Payer: Self-pay

## 2024-07-05 ENCOUNTER — Encounter: Payer: Self-pay | Admitting: Oncology

## 2024-07-05 ENCOUNTER — Other Ambulatory Visit: Payer: Self-pay

## 2024-07-05 ENCOUNTER — Other Ambulatory Visit (HOSPITAL_COMMUNITY): Payer: Self-pay

## 2024-08-09 ENCOUNTER — Other Ambulatory Visit: Payer: Self-pay

## 2024-08-09 ENCOUNTER — Other Ambulatory Visit (HOSPITAL_COMMUNITY): Payer: Self-pay

## 2024-08-09 MED ORDER — ZEPBOUND 2.5 MG/0.5ML ~~LOC~~ SOAJ
2.5000 mg | SUBCUTANEOUS | 0 refills | Status: DC
  Filled 2024-08-09 (×2): qty 2, 28d supply, fill #0

## 2024-08-14 ENCOUNTER — Other Ambulatory Visit: Payer: Self-pay

## 2024-08-14 ENCOUNTER — Emergency Department (HOSPITAL_COMMUNITY)
Admission: EM | Admit: 2024-08-14 | Discharge: 2024-08-14 | Disposition: A | Discharge status: Home / Self Care | Attending: Emergency Medicine | Admitting: Emergency Medicine

## 2024-08-14 ENCOUNTER — Emergency Department (HOSPITAL_COMMUNITY)

## 2024-08-14 ENCOUNTER — Encounter (HOSPITAL_COMMUNITY): Payer: Self-pay

## 2024-08-14 DIAGNOSIS — R000 Tachycardia, unspecified: Secondary | ICD-10-CM | POA: Diagnosis not present

## 2024-08-14 DIAGNOSIS — Z7951 Long term (current) use of inhaled steroids: Secondary | ICD-10-CM | POA: Diagnosis not present

## 2024-08-14 DIAGNOSIS — J45909 Unspecified asthma, uncomplicated: Secondary | ICD-10-CM | POA: Diagnosis not present

## 2024-08-14 DIAGNOSIS — R0789 Other chest pain: Secondary | ICD-10-CM

## 2024-08-14 DIAGNOSIS — R002 Palpitations: Secondary | ICD-10-CM | POA: Diagnosis not present

## 2024-08-14 DIAGNOSIS — F419 Anxiety disorder, unspecified: Secondary | ICD-10-CM | POA: Diagnosis not present

## 2024-08-14 DIAGNOSIS — Z7952 Long term (current) use of systemic steroids: Secondary | ICD-10-CM | POA: Diagnosis not present

## 2024-08-14 DIAGNOSIS — R0602 Shortness of breath: Secondary | ICD-10-CM | POA: Diagnosis not present

## 2024-08-14 LAB — BASIC METABOLIC PANEL WITH GFR
Anion gap: 12 (ref 5–15)
BUN: 12 mg/dL (ref 6–20)
CO2: 26 mmol/L (ref 22–32)
Calcium: 9.5 mg/dL (ref 8.9–10.3)
Chloride: 100 mmol/L (ref 98–111)
Creatinine, Ser: 0.79 mg/dL (ref 0.44–1.00)
GFR, Estimated: >60 mL/min (ref >60)
Glucose, Bld: 91 mg/dL (ref 70–99)
Potassium: 4.3 mmol/L (ref 3.5–5.1)
Sodium: 137 mmol/L (ref 135–145)

## 2024-08-14 LAB — CBC
HCT: 43.7 % (ref 36.0–46.0)
Hemoglobin: 14.3 g/dL (ref 12.0–15.0)
MCH: 29.9 pg (ref 26.0–34.0)
MCHC: 32.7 g/dL (ref 30.0–36.0)
MCV: 91.4 fL (ref 80.0–100.0)
Platelets: 293 K/uL (ref 150–400)
RBC: 4.78 MIL/uL (ref 3.87–5.11)
RDW: 12.3 % (ref 11.5–15.5)
WBC: 10.1 K/uL (ref 4.0–10.5)
nRBC: 0 % (ref 0.0–0.2)

## 2024-08-14 LAB — TROPONIN T, HIGH SENSITIVITY
Troponin T High Sensitivity: <15 ng/L (ref 0–19)
Troponin T High Sensitivity: <15 ng/L (ref 0–19)

## 2024-08-14 LAB — D-DIMER, QUANTITATIVE: D-Dimer, Quant: <0.27 ug{FEU}/mL (ref 0.00–0.50)

## 2024-08-14 MED ORDER — LORAZEPAM 1 MG PO TABS
1.0000 mg | ORAL_TABLET | Freq: Once | ORAL | Status: AC
  Administered 2024-08-14: 1 mg via ORAL
  Filled 2024-08-14: qty 1

## 2024-08-14 NOTE — ED Triage Notes (Signed)
 Pt came in for chest pain and SOB about 30 minutes ago. Pt stated she has no cardiac history and she came because she could not grasp her breath.

## 2024-08-14 NOTE — Discharge Instructions (Signed)
 Follow-up with your primary care doctor.  Return if symptoms worsen.

## 2024-08-14 NOTE — ED Provider Notes (Signed)
 [place] EMERGENCY DEPARTMENT AT [facility] Provider Note   CSN: [id number] Arrival date & time: 08/14/24  1655     Patient presents with: Chest Pain and Shortness of Breath   Deanna Schwartz is a 43 y.o. female.   Patient got sudden chest pain shortness of breath anxiousness prior to coming here.  No recent surgery or travel.  Does have panic attack history and anxiety tach history.  History of asthma but does not feel similar.  She denies any cough sputum production fever chills.  The history is provided by the patient.  Chest Pain Pain location:  Substernal area Pain quality: aching   Pain radiates to:  Does not radiate Pain severity:  No pain Onset quality:  Sudden Progression:  Resolved Chronicity:  New Context: breathing   Relieved by:  Nothing Worsened by:  Nothing Associated symptoms: anxiety and palpitations   Risk factors: high cholesterol   Risk factors: no coronary artery disease, no diabetes mellitus, no hypertension and no prior DVT/PE   Risk factors comment:  Asthma      Prior to Admission medications   Medication Sig Start Date End Date Taking? Authorizing Provider  albuterol  (PROVENTIL ) (2.5 MG/3ML) 0.083% nebulizer solution Take 3 mLs (2.5 mg total) by nebulization every 6 (six) hours as needed for wheezing or shortness of breath. 02/05/24   Plotnikov, Karlynn GAILS, MD  albuterol  (VENTOLIN  HFA) 108 (90 Base) MCG/ACT inhaler Inhale 2 puffs into the lungs every 6 (six) hours as needed for wheezing or shortness of breath. 02/05/24   Plotnikov, Aleksei V, MD  budesonide -formoterol  (SYMBICORT ) 160-4.5 MCG/ACT inhaler Inhale 2 puffs into the lungs 2 (two) times daily. 02/05/24   Plotnikov, Aleksei V, MD  Cholecalciferol (VITAMIN D3) 50 MCG (2000 UT) capsule Take 1 capsule (2,000 Units total) by mouth daily. 02/08/24   Plotnikov, Aleksei V, MD  Cyanocobalamin  (VITAMIN B-12) 5000 MCG SUBL Take 1 tablet sublingually daily 02/08/24   Plotnikov, Aleksei V, MD   EPINEPHrine  (EPIPEN  2-PAK) 0.3 mg/0.3 mL IJ SOAJ injection Inject 1 pen into the muscle as needed for anaphylaxis. 04/23/23   Plotnikov, Karlynn GAILS, MD  fluocinonide  (LIDEX ) 0.05 % external solution Apply sparingly to scalp once daily as needed for itching. 09/03/23     ketoconazole  (NIZORAL ) 2 % shampoo Apply a small amount topically to wet scalp once a week, leave on for 3-5 minutes then rinse 09/03/23     levalbuterol  (XOPENEX  HFA) 45 MCG/ACT inhaler Inhale 2 puffs into the lungs every 4 (four) hours as needed for wheezing. 02/05/24   Plotnikov, Aleksei V, MD  levocetirizine (XYZAL ) 5 MG tablet Take 1 tablet (5 mg total) by mouth every evening. 05/03/24   Plotnikov, Karlynn GAILS, MD  methylPREDNISolone  (MEDROL  DOSEPAK) 4 MG TBPK tablet Taper As directed on package 02/05/24   Plotnikov, Aleksei V, MD  montelukast  (SINGULAIR ) 10 MG tablet Take 1 tablet (10 mg total) by mouth at bedtime. 12/23/23   Elnor Lauraine BRAVO, NP  predniSONE  (DELTASONE ) 20 MG tablet Take 2 tablets daily with breakfast. 07/02/24   Christopher Savannah, PA-C  Prenatal Vit-Fe Fumarate-FA (PRENATAL VITAMINS PO) Take 1 tablet by mouth daily.    [provider]  promethazine -dextromethorphan (PROMETHAZINE -DM) 6.25-15 MG/5ML syrup Take 5 mLs by mouth 3 (three) times daily as needed for cough. 07/02/24   Christopher Savannah, PA-C  sertraline  (ZOLOFT ) 50 MG tablet Take 1 tablet (50 mg total) by mouth daily. 07/23/23   Plotnikov, Aleksei V, MD  tirzepatide  (ZEPBOUND ) 2.5 MG/0.5ML Pen  Inject 2.5 mg into the skin every 7 (seven) days. 08/09/24     Vitamin D , Ergocalciferol , (DRISDOL ) 1.25 MG (50000 UNIT) CAPS capsule Take 1 capsule (50,000 Units total) by mouth every 7 (seven) days. 02/08/24   Plotnikov, Karlynn GAILS, MD    Allergies: Augmentin  [amoxicillin -pot clavulanate], Cephalexin, and Iodine     Review of Systems  Cardiovascular:  Positive for chest pain and palpitations.    Updated Vital Signs BP (!) 130/47   Pulse 89   Temp 97.9 F (36.6 C) (Oral)    Resp 14   LMP 09/13/2022 (Exact Date)   SpO2 98%   Physical Exam Vitals and nursing note reviewed.  Constitutional:      General: She is not in acute distress.    Appearance: She is well-developed.  HENT:     Head: Normocephalic and atraumatic.  Eyes:     Extraocular Movements: Extraocular movements intact.     Conjunctiva/sclera: Conjunctivae normal.     Pupils: Pupils are equal, round, and reactive to light.  Cardiovascular:     Rate and Rhythm: Regular rhythm. Tachycardia present.     Heart sounds: Normal heart sounds. No murmur heard. Pulmonary:     Effort: Pulmonary effort is normal. No respiratory distress.     Breath sounds: Normal breath sounds.  Abdominal:     Palpations: Abdomen is soft.     Tenderness: There is no abdominal tenderness.  Musculoskeletal:        General: No swelling.     Cervical back: Normal range of motion and neck supple.  Skin:    General: Skin is warm and dry.     Capillary Refill: Capillary refill takes less than 2 seconds.  Neurological:     Mental Status: She is alert.  Psychiatric:        Mood and Affect: Mood is anxious.     (all labs ordered are listed, but only abnormal results are displayed) Labs Reviewed  BASIC METABOLIC PANEL WITH GFR  CBC  D-DIMER, QUANTITATIVE (NOT AT [facility])  TROPONIN T, HIGH SENSITIVITY  TROPONIN T, HIGH SENSITIVITY    EKG: EKG Interpretation Date/Time:  Sunday August 14 2024 17:02:54 EST Ventricular Rate:  91 PR Interval:  146 QRS Duration:  90 QT Interval:  334 QTC Calculation: 411 R Axis:   43  Text Interpretation: Sinus rhythm Confirmed by Ruthe Cornet [phone]) on 08/14/2024 5:47:32 PM  Radiology: [facility] Chest 2 View Result Date: 08/14/2024 EXAM: 2 VIEW(S) XRAY OF THE CHEST 08/14/2024 05:29:00 PM COMPARISON: None available. CLINICAL HISTORY: Shortness of Breath Shortness of Breath FINDINGS: LUNGS AND PLEURA: No focal pulmonary opacity. No pleural effusion. No pneumothorax. HEART AND  MEDIASTINUM: No acute abnormality of the cardiac and mediastinal silhouettes. BONES AND SOFT TISSUES: No acute osseous abnormality. IMPRESSION: 1. No acute process. Electronically signed by: Franky Crease MD 08/14/2024 05:33 PM EST RP Workstation: [id number]     Procedures   Medications Ordered in the ED  LORazepam  (ATIVAN ) tablet 1 mg (1 mg Oral Given 08/14/24 1807)                                    Medical Decision Making Amount and/or Complexity of Data Reviewed Labs: ordered. Radiology: ordered.  Risk Prescription drug management.   Deanna Schwartz is here with shortness of breath chest pain palpitations.  Normal vitals.  No fever.  Mildly tachycardic.  EKG shows sinus rhythm.  No  ischemic changes.  She has a history of panic attacks asthma IBS.  She does not have any major cardiac risk factors.  Heart score is 1.  Ultimately I do think that this is likely anxiety related process but will evaluate for PE ACS anemia other electrolyte abnormality pneumonia.  Will get CBC BMP troponin D-dimer chest x-ray will give Ativan  and reevaluate.  EKG shows sinus rhythm.  No ischemic changes.  She is mildly tachycardic and cannot use PERC.  Per my review interpretation of x-ray there is no significant process, no pneumonia pneumothorax.  Radiology report with the same.  Troponin negative x 2.  D-dimer normal.  No significant anemia electrolyte abnormality kidney injury or leukocytosis.  Feeling better after Ativan .  I do think that this is likely panic/anxiety related process.  Recommend rest stress reduction at home.  Follow-up with primary care doctor for further care and evaluation.  Told to return if symptoms worsen.  She denies any SI HI.  She feels safe at home.  This chart was dictated using voice recognition software.  Despite best efforts to proofread,  errors can occur which can change the documentation meaning.      Final diagnoses:  Atypical chest pain    ED Discharge Orders      None          Ruthe Cornet, DO 08/14/24 2055

## 2024-08-19 ENCOUNTER — Other Ambulatory Visit (HOSPITAL_COMMUNITY): Payer: Self-pay

## 2024-10-11 ENCOUNTER — Encounter: Payer: Self-pay | Admitting: Internal Medicine
